# Patient Record
Sex: Male | Born: 1940
Health system: Southern US, Community
[De-identification: ages and names within clinical notes are randomized; demographics above are authoritative.]

## PROBLEM LIST (undated history)

## (undated) DIAGNOSIS — D509 Iron deficiency anemia, unspecified: Secondary | ICD-10-CM

## (undated) DIAGNOSIS — I10 Essential (primary) hypertension: Secondary | ICD-10-CM

## (undated) DIAGNOSIS — I714 Abdominal aortic aneurysm, without rupture, unspecified: Secondary | ICD-10-CM

## (undated) DIAGNOSIS — H9190 Unspecified hearing loss, unspecified ear: Secondary | ICD-10-CM

## (undated) DIAGNOSIS — E785 Hyperlipidemia, unspecified: Secondary | ICD-10-CM

## (undated) DIAGNOSIS — K219 Gastro-esophageal reflux disease without esophagitis: Secondary | ICD-10-CM

## (undated) DIAGNOSIS — C679 Malignant neoplasm of bladder, unspecified: Secondary | ICD-10-CM

## (undated) DIAGNOSIS — Z5189 Encounter for other specified aftercare: Secondary | ICD-10-CM

## (undated) DIAGNOSIS — G4730 Sleep apnea, unspecified: Secondary | ICD-10-CM

## (undated) DIAGNOSIS — I2510 Atherosclerotic heart disease of native coronary artery without angina pectoris: Secondary | ICD-10-CM

## (undated) DIAGNOSIS — A809 Acute poliomyelitis, unspecified: Secondary | ICD-10-CM

## (undated) HISTORY — PX: KNEE SURGERY: SHX244

## (undated) HISTORY — DX: Atherosclerotic heart disease of native coronary artery without angina pectoris: I25.10

## (undated) HISTORY — PX: CORONARY STENT PLACEMENT: SHX1402

## (undated) HISTORY — DX: Essential (primary) hypertension: I10

## (undated) HISTORY — DX: Iron deficiency anemia, unspecified: D50.9

## (undated) HISTORY — PX: CHOLECYSTECTOMY: SHX55

## (undated) HISTORY — DX: Acute poliomyelitis, unspecified: A80.9

## (undated) HISTORY — PX: KNEE ARTHROPLASTY: SHX992

## (undated) HISTORY — DX: Hyperlipidemia, unspecified: E78.5

## (undated) SURGERY — COLONOSCOPY
Anesthesia: Moderate Sedation

---

## 1999-12-15 ENCOUNTER — Ambulatory Visit (HOSPITAL_BASED_OUTPATIENT_CLINIC_OR_DEPARTMENT_OTHER): Admission: RE | Admit: 1999-12-15 | Discharge: 1999-12-16 | Payer: Self-pay | Admitting: Orthopedic Surgery

## 2004-05-06 ENCOUNTER — Encounter (INDEPENDENT_AMBULATORY_CARE_PROVIDER_SITE_OTHER): Payer: Self-pay | Admitting: *Deleted

## 2004-05-06 ENCOUNTER — Observation Stay (HOSPITAL_COMMUNITY): Admission: RE | Admit: 2004-05-06 | Discharge: 2004-05-07 | Payer: Self-pay | Admitting: General Surgery

## 2004-12-05 ENCOUNTER — Ambulatory Visit: Payer: Self-pay | Admitting: *Deleted

## 2004-12-05 ENCOUNTER — Ambulatory Visit (HOSPITAL_COMMUNITY): Admission: RE | Admit: 2004-12-05 | Discharge: 2004-12-05 | Payer: Self-pay | Admitting: *Deleted

## 2004-12-06 ENCOUNTER — Ambulatory Visit (HOSPITAL_COMMUNITY): Admission: RE | Admit: 2004-12-06 | Discharge: 2004-12-06 | Payer: Self-pay | Admitting: *Deleted

## 2004-12-07 ENCOUNTER — Ambulatory Visit: Payer: Self-pay | Admitting: Cardiovascular Disease

## 2004-12-08 ENCOUNTER — Inpatient Hospital Stay (HOSPITAL_BASED_OUTPATIENT_CLINIC_OR_DEPARTMENT_OTHER): Admission: RE | Admit: 2004-12-08 | Discharge: 2004-12-08 | Payer: Self-pay | Admitting: *Deleted

## 2004-12-08 ENCOUNTER — Ambulatory Visit: Payer: Self-pay | Admitting: *Deleted

## 2004-12-12 ENCOUNTER — Observation Stay (HOSPITAL_COMMUNITY): Admission: RE | Admit: 2004-12-12 | Discharge: 2004-12-13 | Payer: Self-pay | Admitting: Cardiology

## 2004-12-21 ENCOUNTER — Ambulatory Visit: Payer: Self-pay | Admitting: *Deleted

## 2005-03-28 ENCOUNTER — Ambulatory Visit: Payer: Self-pay | Admitting: *Deleted

## 2005-03-31 ENCOUNTER — Ambulatory Visit: Payer: Self-pay | Admitting: *Deleted

## 2005-12-29 ENCOUNTER — Ambulatory Visit: Payer: Self-pay | Admitting: *Deleted

## 2006-01-23 ENCOUNTER — Ambulatory Visit (HOSPITAL_COMMUNITY): Admission: RE | Admit: 2006-01-23 | Discharge: 2006-01-23 | Payer: Self-pay | Admitting: Family Medicine

## 2006-04-09 ENCOUNTER — Ambulatory Visit (HOSPITAL_COMMUNITY): Admission: RE | Admit: 2006-04-09 | Discharge: 2006-04-09 | Payer: Self-pay | Admitting: Internal Medicine

## 2006-04-11 ENCOUNTER — Ambulatory Visit (HOSPITAL_COMMUNITY): Admission: RE | Admit: 2006-04-11 | Discharge: 2006-04-11 | Payer: Self-pay | Admitting: Internal Medicine

## 2006-04-11 ENCOUNTER — Ambulatory Visit: Payer: Self-pay | Admitting: Internal Medicine

## 2006-04-11 ENCOUNTER — Encounter (INDEPENDENT_AMBULATORY_CARE_PROVIDER_SITE_OTHER): Payer: Self-pay | Admitting: Specialist

## 2007-01-09 ENCOUNTER — Ambulatory Visit: Payer: Self-pay | Admitting: Cardiovascular Disease

## 2007-01-14 ENCOUNTER — Encounter (HOSPITAL_COMMUNITY): Admission: RE | Admit: 2007-01-14 | Discharge: 2007-02-13 | Payer: Self-pay | Admitting: Cardiovascular Disease

## 2007-01-14 ENCOUNTER — Ambulatory Visit: Payer: Self-pay | Admitting: Cardiology

## 2007-06-09 ENCOUNTER — Ambulatory Visit: Payer: Self-pay | Admitting: Cardiovascular Disease

## 2007-06-09 ENCOUNTER — Inpatient Hospital Stay (HOSPITAL_COMMUNITY): Admission: EM | Admit: 2007-06-09 | Discharge: 2007-06-12 | Payer: Self-pay | Admitting: Emergency Medicine

## 2007-06-15 ENCOUNTER — Ambulatory Visit: Payer: Self-pay | Admitting: Cardiology

## 2007-07-08 ENCOUNTER — Ambulatory Visit: Payer: Self-pay | Admitting: Cardiovascular Disease

## 2007-07-09 ENCOUNTER — Ambulatory Visit: Payer: Self-pay | Admitting: Cardiovascular Disease

## 2007-07-09 ENCOUNTER — Inpatient Hospital Stay (HOSPITAL_BASED_OUTPATIENT_CLINIC_OR_DEPARTMENT_OTHER): Admission: RE | Admit: 2007-07-09 | Discharge: 2007-07-09 | Payer: Self-pay | Admitting: Cardiovascular Disease

## 2007-07-09 HISTORY — PX: CARDIAC CATHETERIZATION: SHX172

## 2007-09-12 ENCOUNTER — Ambulatory Visit: Payer: Self-pay | Admitting: Cardiovascular Disease

## 2008-03-26 ENCOUNTER — Ambulatory Visit: Payer: Self-pay | Admitting: Cardiovascular Disease

## 2008-03-26 ENCOUNTER — Ambulatory Visit: Payer: Self-pay

## 2008-09-17 DIAGNOSIS — E785 Hyperlipidemia, unspecified: Secondary | ICD-10-CM | POA: Insufficient documentation

## 2008-09-17 DIAGNOSIS — I2541 Coronary artery aneurysm: Secondary | ICD-10-CM | POA: Insufficient documentation

## 2008-09-18 ENCOUNTER — Encounter: Payer: Self-pay | Admitting: Cardiovascular Disease

## 2008-09-18 ENCOUNTER — Ambulatory Visit: Payer: Self-pay | Admitting: Cardiovascular Disease

## 2008-10-29 ENCOUNTER — Encounter: Payer: Self-pay | Admitting: Cardiology

## 2008-11-06 ENCOUNTER — Ambulatory Visit: Payer: Self-pay | Admitting: Cardiovascular Disease

## 2008-11-11 ENCOUNTER — Encounter (HOSPITAL_COMMUNITY): Admission: RE | Admit: 2008-11-11 | Discharge: 2008-12-11 | Payer: Self-pay | Admitting: Cardiovascular Disease

## 2008-11-11 ENCOUNTER — Ambulatory Visit: Payer: Self-pay | Admitting: Cardiology

## 2009-02-23 ENCOUNTER — Encounter (INDEPENDENT_AMBULATORY_CARE_PROVIDER_SITE_OTHER): Payer: Self-pay | Admitting: *Deleted

## 2009-02-23 LAB — CONVERTED CEMR LAB
ALT: 27 units/L
AST: 26 units/L
Albumin: 4.8 g/dL
Alkaline Phosphatase: 48 units/L
BUN: 18 mg/dL
CO2: 23 meq/L
Calcium: 10 mg/dL
Chloride: 104 meq/L
Cholesterol: 125 mg/dL
Creatinine, Ser: 0.82 mg/dL
Glucose, Bld: 99 mg/dL
HDL: 47 mg/dL
LDL Cholesterol: 66 mg/dL
Potassium: 4.8 meq/L
Sodium: 142 meq/L
Total Protein: 7.8 g/dL
Triglycerides: 60 mg/dL

## 2009-05-18 ENCOUNTER — Encounter (INDEPENDENT_AMBULATORY_CARE_PROVIDER_SITE_OTHER): Payer: Self-pay | Admitting: *Deleted

## 2009-05-19 ENCOUNTER — Ambulatory Visit: Payer: Self-pay | Admitting: Cardiovascular Disease

## 2009-05-19 DIAGNOSIS — I739 Peripheral vascular disease, unspecified: Secondary | ICD-10-CM | POA: Insufficient documentation

## 2009-05-25 ENCOUNTER — Ambulatory Visit (HOSPITAL_COMMUNITY): Admission: RE | Admit: 2009-05-25 | Discharge: 2009-05-25 | Payer: Self-pay | Admitting: Cardiovascular Disease

## 2009-10-20 ENCOUNTER — Encounter: Payer: Self-pay | Admitting: Cardiovascular Disease

## 2009-11-29 ENCOUNTER — Ambulatory Visit: Payer: Self-pay | Admitting: Cardiovascular Disease

## 2010-07-03 ENCOUNTER — Encounter: Payer: Self-pay | Admitting: Family Medicine

## 2010-07-12 NOTE — Assessment & Plan Note (Signed)
Summary: 6 mth f/u per checkout on 05/19/09/tg  Medications Added PLAVIX 75 MG TABS (CLOPIDOGREL BISULFATE) 1 tab by mouth once daily NITROSTAT 0.4 MG SUBL (NITROGLYCERIN) 1 tablet under tongue at onset of chest pain; you may repeat every 5 minutes for up to 3 doses.      Allergies Added: NKDA  Visit Type:  Follow-up Primary Provider:  DR.ZACH HALL  CC:  no cardiology complaints.  History of Present Illness: [name] is seen today for F/U of HTN, elevated lipids and CAD he has had previous stenting of the LAD.  Stent was patent on cath in 2009 and he had a non-ischemic myovue in 11/2008.  He has been doing well with no significatn SSCP.  His activity is limited by previous polio.  He has pain in his calves with ambulation.  Sometimes it occurs at rest.  Arterial Duplex and ABI's last visit showed no significant PVD.  He denises PND, orthopnea, palpitations, dypnea or edema.  He has been compliant with his meds    Current Medications (verified): 1)  Plavix 75 Mg Tabs (Clopidogrel Bisulfate) .Marland Kitchen.. 1 Tab By Mouth Once Daily 2)  Vytorin 10-40 Mg Tabs (Ezetimibe-Simvastatin) .... Tab By Mouth Once Daily 3)  Aspirin 81 Mg  Tabs (Aspirin) .Marland Kitchen.. 1 Tab By Mouth Once Daily 4)  Nitrostat 0.4 Mg Subl (Nitroglycerin) .Marland Kitchen.. 1 Tablet Under Tongue At Onset of Chest Pain; You May Repeat Every 5 Minutes For Up To 3 Doses.  Allergies (verified): No Known Drug Allergies  Vital Signs:  Patient profile:   70 year old male Weight:      189 pounds Pulse rate:   77 / minute BP sitting:   116 / 70  (right arm)  Vitals Entered By: Dreama Saa, CNA (November 29, 2009 2:22 PM)  Physical Exam  General:  Affect appropriate Healthy:  appears stated age HEENT: normal Neck supple with no adenopathy JVP normal no bruits no thyromegaly Lungs clear with no wheezing and good diaphragmatic motion Heart:  S1/S2 no murmur,rub, gallop or click PMI normal Abdomen: benighn, BS positve, no tenderness, no AAA no bruit.  No  HSM or HJR Distal pulses intact with no bruits No edema Neuro  withhered right arm from polio Skin warm and dry    Impression & Recommendations:  Problem # 1:  CORONARY ARTERY ANEURYSM (ICD-414.11) Stable no angina.  Continue ASA Refilles for Plavix and sl nitro called in  Problem # 2:  HYPERLIPIDEMIA-MIXED (ICD-272.4) LDL at goal with no side effects His updated medication list for this problem includes:    Vytorin 10-40 Mg Tabs (Ezetimibe-simvastatin) .Marland Kitchen... Tab by mouth once daily  Problem # 3:  CLAUDICATION (ICD-443.9) Leg pain improved with less caffeine.  Duplex with onl mild tibial disease bilaterally  Patient Instructions: 1)  Your physician recommends that you schedule a follow-up appointment in: 1 year 2)  Your physician recommends that you continue on your current medications as directed. Please refer to the Current Medication list given to you today. Prescriptions: PLAVIX 75 MG TABS (CLOPIDOGREL BISULFATE) 1 tab by mouth once daily  #30 x 11   Entered by:   Larita Fife Via LPN   Authorized by:   Benjamin Branch, MD, [facility]   Signed by:   Larita Fife Via LPN on 25/36/6440   Method used:   Electronically to        [facility]  [address] (retail)       [address]  [place], [place]  [address]       Ph: [phone]       Fax: [phone]   RxID:   [id number] NITROSTAT 0.4 MG SUBL (NITROGLYCERIN) 1 tablet under tongue at onset of chest pain; you may repeat every 5 minutes for up to 3 doses.  #25 x 3   Entered by:   Larita Fife Via LPN   Authorized by:   Benjamin Branch, MD, [facility]   Signed by:   Larita Fife Via LPN on 87/56/4332   Method used:   Electronically to        [facility]  [address] (retail)       [address]       [place], [place]  [address]       Ph: [phone]       Fax: [phone]   RxID:   [id number]

## 2010-07-12 NOTE — Miscellaneous (Signed)
Summary: [facility]   CC:  cheack up no complaints.  Vital Signs:  Patient profile:   70 year old male Weight:      138 pounds Pulse rate:   58 / minute BP sitting:   138 / 78  (left arm)  Vitals Entered By: Kem Parkinson (September 18, 2008 10:01 AM)

## 2010-07-12 NOTE — Miscellaneous (Signed)
Summary: LABS CMP,LIPIDS 02/23/2009  Clinical Lists Changes  Observations: Added new observation of CALCIUM: 10.0 mg/dL (16/03/9603 54:09) Added new observation of ALBUMIN: 4.8 g/dL (81/19/1478 29:56) Added new observation of PROTEIN, TOT: 7.8 g/dL (21/30/8657 84:69) Added new observation of SGPT (ALT): 27 units/L (02/23/2009 16:39) Added new observation of SGOT (AST): 26 units/L (02/23/2009 16:39) Added new observation of ALK PHOS: 48 units/L (02/23/2009 16:39) Added new observation of CREATININE: 0.82 mg/dL (62/95/2841 32:44) Added new observation of BUN: 18 mg/dL (06/14/7251 66:44) Added new observation of BG RANDOM: 99 mg/dL (03/47/4259 56:38) Added new observation of CO2 PLSM/SER: 23 meq/L (02/23/2009 16:39) Added new observation of CL SERUM: 104 meq/L (02/23/2009 16:39) Added new observation of K SERUM: 4.8 meq/L (02/23/2009 16:39) Added new observation of NA: 142 meq/L (02/23/2009 16:39) Added new observation of LDL: 66 mg/dL (75/64/3329 51:88) Added new observation of HDL: 47 mg/dL (41/66/0630 16:01) Added new observation of TRIGLYC TOT: 60 mg/dL (09/32/3557 32:20) Added new observation of CHOLESTEROL: 125 mg/dL (25/42/7062 37:62)

## 2010-10-25 NOTE — Assessment & Plan Note (Signed)
[facility]                            CARDIOLOGY OFFICE NOTE   [name], [name]                      MRN:          [id number]  DATE:03/26/2008                            DOB:          [birth year]    A 70 year old patient I usually see in  [place].  He was kind enough  to come to [facility] to see me.  He has a previous history of polio  with hypoplastic right side.  He has coronary artery disease with a  stent to the LAD.  He had a cath last year, which showed a widely patent  stent.  He is not having significant chest pain.  He is active.  He has  been compliant with his medications.   He has had occasional lightheaded spells.  They are not clearly related  to bradycardia, although I think we need to look into this a bit more.  He does not have palpitations or history of tachybrady syndrome,  although his heart rates do tend to be low and sinus bradycardia at  rates of anywhere from 48-52.  He is on no AV nodal blocking drugs.   He has no known allergies.   On aspirin today, Vytorin 10/40 and Plavix.   PHYSICAL EXAMINATION:  VITAL SIGNS:  His exam is remarkable for weight  of 187, pulse 50, blood pressure 130/70.  His pulse increased to 74 with  medium paced walking.  GENERAL:  He has significant residual results from his polio with right-  sided hypoplasia.  HEENT:  Unremarkable.  NECK:  Carotids are normal without bruit.  No lymphadenopathy,  thyromegaly, or JVP elevation.  LUNGS:  Clear with good diaphragmatic motion.  No wheezing.  S1 and S2.  Normal heart sounds.  PMI normal.  ABDOMEN:  Benign.  Bowel sounds positive.  No AAA.  No tenderness.  No  bruit.  No hepatosplenomegaly or hepatojugular reflux.  No tenderness.  EXTREMITIES:  Distal pulses intact.  No edema.  NEURO:  Nonfocal.   He has right-sided muscular wasting from his polio.  EKG shows sinus  bradycardia and is otherwise normal.   IMPRESSION:  1. History of coronary  artery disease to left anterior descending.      Continue aspirin, Plavix, no need for functional study giving cath      last year showing patent stent.  2. Episodes of dizziness and lightheadedness may be benign postural.      However, given his bradycardia, will give him an event monitor for      4-6 weeks and rule out any undiagnosed heart block or severe pause.      Avoid anteroventral nodal blocking drugs.  3. Hypercholesteremia in the setting of coronary artery disease.      Continue Vytorin, lipid and liver profile in 6 months.  4. History of polio.  I strongly recommended the patient to get H1N1      vaccine.  He is already had his flu vaccine.  He will talk to Dr.      Regino Schultze and see if one is available in  [place].  [name], MD, [facility]  Electronically Signed    PCN/MedQ  DD: 03/26/2008  DT: 03/27/2008  Job #: [id number]

## 2010-10-25 NOTE — Assessment & Plan Note (Signed)
[facility]                            CARDIOLOGY OFFICE NOTE   BENINO, KORINEK                      MRN:          [id number]  DATE:09/18/2008                            DOB:          March 30, 1941    [name] returns today for followup.  I had previously seen him in  [place].  He has a history of coronary artery disease with stenting  of the LAD about 4 years ago.   He has hypertension, hyperlipidemia.  His blood pressure is well  controlled off medicine.  He is trying to follow a low-sodium diet.  His  lab work was checked by Dr. Catalina Pizza in February.  I reviewed it.  His  LDL cholesterol was 57, total cholesterol was 123.  LFTs were normal.  Fasting glucose was a little high at 107.   We talked to Chrissie Noa at length regarding a low-carbohydrate diet.  I  also told him to have Dr. Catalina Pizza check a hemoglobin A1c on him.  The  patient did not had any significant chest pain.  I believe, we did a  Myoview study on him in [place] a year ago and it was nonischemic.   He is ambulatory.  He has some functional limitations from his previous  polio.   REVIEW OF SYSTEMS:  Otherwise negative.   He has no known allergies.   He is on aspirin a day, Vytorin 10/40, Plavix 75 a day.   PHYSICAL EXAMINATION:  GENERAL:  Remarkable for jovial male in no  distress.  He has a hypoplastic right side from his polio.  VITAL SIGNS:  Blood pressure is 130/70, pulse 70 and regular,  respiratory rate 14, afebrile.  HEENT:  Unremarkable.  NECK:  Carotids are normal without bruit.  No lymphadenopathy,  thyromegaly, or JVP elevation.  LUNGS:  Clear.  Good diaphragmatic motion.  No wheezing.  CARDIAC:  S1, S2.  Normal heart sounds.  PMI normal.  ABDOMEN:  Benign.  Bowel sounds positive.  No AAA, no tenderness, no  bruit, no hepatosplenomegaly, no hepatojugular reflux, or tenderness.  EXTREMITIES:  Distal pulses are intact.  No edema.  NEURO:  Nonfocal.  SKIN:   Warm and dry.  MUSCULOSKELETAL:  No muscular weakness.   IMPRESSION:  1. Coronary artery disease, previous stenting of the left anterior      descending.  Consider followup Myoview in a year.  Continue aspirin      and Plavix.  2. Hypercholesterolemia, well within goal.  Continue current dose of      Vytorin.  LFTs normal.  3. Previous polio, compensated.  No need for PT, OT.  4. Borderline blood sugar.  Check hemoglobin A1c per Catalina Pizza.      Dietary instructions given.     [name], MD, [facility]  Electronically Signed    PCN/MedQ  DD: 09/18/2008  DT: 09/18/2008  Job #: [id number]

## 2010-10-25 NOTE — Assessment & Plan Note (Signed)
[facility]                       [place] CARDIOLOGY OFFICE NOTE   [name], [name]                      MRN:          [id number]  DATE:11/06/2008                            DOB:          1941/04/28    [name] returns today for followup.  He has had increasing episodes or  presyncope.  He has had these episodes in the past for 20-30 seconds.  He feels lightheaded.  He sounds like he is having vagal episodes.  He  gets little dizzy.  There is no true vertigo.  There is no palpitations  or chest pain.  He has never actually fainted over the past week.  He  has been having 2 to 3 episodes per day.  He seems to downplay the  symptoms, but his wife wants to talk about them quite a bit.  He has had  these episodes in the past and we never found a cardiac etiology to  them.  He has had event monitors twice with no arrhythmias.   He has known coronary artery disease with presyncope and known coronary  disease.  His last Myoview study was done in August 2008.  He had a  negative pharmacological stress with normal left ventricular cavity size  and function.   In regards to his coronary artery disease, he has had previous stenting  of the LAD.  His last heart catheterization was done in January 2009 and  his stent was patent with good LV function and normal left ventricle  EDP.   He has been compliant with his medications including Plavix.   There seems to be no particular trigger for the presyncope.  He has not  had a history of seizures and there has been no evidence of TIA or CVA.  Ten-point review of systems otherwise negative.  He is on Plavix 75 a  day, Vytorin 10/40, and aspirin a day.   PHYSICAL EXAMINATION:  GENERAL:  His exam is remarkable for previous  polio deformity in the right upper extremity.  VITAL SIGNS:  His blood pressure is 122/76.  He is not postural.  Pulses  59 and regular.  Weight is 192.  Affect appropriate.  HEENT:   Unremarkable.  Carotids are normal without bruit.  No  lymphadenopathy, thyromegaly, or JVP elevation.  LUNGS:  Clear.  Good diaphragmatic motion.  No wheezing.  S1 and S2  normal heart sounds.  PMI normal.  ABDOMEN:  Benign.  Bowel sounds positive.  No AAA.  No tenderness.  No  bruit.  No hepatosplenomegaly or hepatojugular reflux.  No tenderness.  EXTREMITIES:  Distal pulses are intact.  No edema.  NEUROLOGIC:  Nonfocal.  SKIN:  Warm and dry.  MUSCULOSKELETAL:  No muscular weakness.   IMPRESSION:  1. Presyncope.  The patient does not want another event monitor to      previous ones have been unrevealing likely a simple vagal episodes,      stay well hydrated.  2. Coronary disease.  Continue aspirin and Plavix.  In light of his      presyncope, we will check an adenosine Myoview to rule  out      progression of coronary disease.  He is not having significant      chest pain.  Continue antiplatelet therapy.  3. Hypercholesterolemia.  Continue Vytorin, lipid, and liver profile      per Dr. Catalina Pizza.   I think the patient's heart is stable.  Hopefully, his Myoview study  will be normal.      Theron Arista C. Eden Emms, MD, [facility]  Electronically Signed    PCN/MedQ  DD: 11/06/2008  DT: 11/07/2008  Job #: [id number]

## 2010-10-25 NOTE — Discharge Summary (Signed)
NAME:  [name], [name] NO.:  [id number]   MEDICAL RECORD NO.:  [id number]          PATIENT TYPE:  INP   LOCATION:  A206                          FACILITY:  [facility]   PHYSICIAN:  Catalina Pizza, M.D.        DATE OF BIRTH:  Oct 26, 1940   DATE OF ADMISSION:  06/09/2007  DATE OF DISCHARGE:  12/30/2008LH                               DISCHARGE SUMMARY   DISCHARGE DIAGNOSES:  1. Presyncope.  2. History of coronary artery disease.  3. Hypertension.  4. Hyperlipidemia.  5. Post polio syndrome.   DISCHARGE MEDICATIONS:  1. Plavix 75 mg p.o. daily.  2. Aspirin 81 mg p.o. daily.  3. Vytorin 10/40 p.o. q.h.s.   DISCHARGE PHYSICAL EXAM:  VITAL SIGNS:  Temperature is 98.2, blood  pressure 121/76, pulse 57, respirations 18, O2 sat 94 to 96% on room  air. GENERAL:  This is a white male sitting on the side of the bed in no  acute distress.  HEENT:  Unremarkable.  NECK:  Showed no carotid bruits.  No thyromegaly.  LUNGS:  Good air movement throughout.  No rhonchi or wheezing.  HEART:  Regular rate and rhythm.  No murmurs, gallops or rubs.  ABDOMEN:  Soft, nontender, nondistended, positive bowel sounds.  EXTREMITIES:  Trace lower extremity edema bilaterally.  NEUROLOGIC:  Cranial nerves II-XII intact, has post polio type syndrome.  His right arm has significant atrophy and does have some scoliosis type  findings which are chronic.  No other neurologic complaints at this  time.   LABORATORY DATA:  Lab work obtained during hospitalization.  Initial CBC  showed white count 12.8, hemoglobin of 14.7, B met shows sodium 138,  potassium 3.7, chloride of 107, CO2 of 25, glucose 107, BUN 17,  creatinine 0.72, calcium of 9.1.  Magnesium was 2.0.  Initial CK was  349, 512, 544 and 424 respectively, MB was 14.4, 11.5, 11.4 and 10.5  respectively and troponin I was 0.03, 0.04, 0.03 and 0.01 respectively.  Lipid profile showed total cholesterol 138, triglycerides 93, HDL 31,  LDL of 88.   SCANS OBTAINED:  Patient did have carotid Dopplers which results are not  available at this time due to pack system being down.  The head CT was  read as negative.  No acute findings.  There is a 2-D echo which was  pending and may be able to be done this morning, if unable to then will  like be on need to the as outpatient.   HOSPITAL COURSE:  Per problem:   Presyncope.  It is unclear cause of this whether its neurogenetic or  cardiogenic.  He is not exhibiting any cardiac findings other than some  mild elevation CK and MB.  Unclear exactly cause of that, but does not  exhibit any ischemia per on troponin I's.  No abnormalities on  telemetry.  He does run a slower heart rate anyway, is not on a blood  pressure medicine.  Question whether its related to drop in blood sugars  whether was related to vagal type syndrome is unclear.  He did not  eat  anything that morning, which is his normal pattern but unclear if that  contributed to it or not, but given his cardiac history did get  cardiology involved to assess and did not feel this was a cardiac  related initially, but further evaluation is warranted including 2-D  echo and may need further outpatient workup.  The patient will continue  on Plavix, aspirin and Vytorin.  If able to get 2-D echo today, this  morning then great, it not been may need to be done as an outpatient.   DISPOSITION:  The patient will be discharged home with follow-up with me  in approximately 2 to 3 weeks and may want to get further cardiac follow-  up as an outpatient.      Catalina Pizza, M.D.  Electronically Signed     ZH/MEDQ  D:  06/11/2007  T:  06/11/2007  Job:  [id number]

## 2010-10-25 NOTE — Assessment & Plan Note (Signed)
[name]                       [place] CARDIOLOGY OFFICE NOTE   [name], [name]                      MRN:          [id number]  DATE:01/09/2007                            DOB:          1941/01/26    [name] is seen today as a new patient by me.  He has previously been  seen by [name].  He has also switched primary care doctors; he is  seeing [name] now as well as [name].   The patient has a previous history of polio resulting in right-sided  atrophy and weakness, particularly of the right upper extremity.  He has  coronary artery disease with previous drug-eluting stents to the  proximal LAD; this was done in July 2006.   He has not had a followup stress test since that time.   The patient has not had his nitroglycerin refilled since he had his  stents placed.   In talking to him, he is fairly active.  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  He walks on a daily basis.  He has not had  any significant chest pain, PND or orthopnea.  He has been no lower  extremity edema.   His EF has been in the normal range by previous echo, but has not been  checked in 2 years.   REVIEW OF SYSTEMS:  Otherwise negative.   PAST MEDICAL HISTORY:  Otherwise remarkable for:  1. Polio.  2. Coronary artery disease.  3. Hyperlipidemia.  4. Previous tobacco abuse.  5. Previous left knee surgery.   MEDICATIONS:  1. Plavix 75 mg a day, for which he needs a refill.  2. Vytorin 10/40.  3. An aspirin a day.   He is not on beta blockers.   PHYSICAL EXAMINATION:  His exam is remarkable for a healthy-appearing  middle-aged white male in no distress.  Weight is 204.  Affect is  appropriate.  Respiratory rate is 14, blood pressure is 120/78 and pulse  is 80 and regular.  HEENT:  Normal.  There is no lymphadenopathy, no thyromegaly, no bruits, no JVP  elevation.  Neck is otherwise supple.  LUNGS:  Clear.  Good  diaphragmatic motion.  No wheezing.  He does have a bit of hypoplastic right thorax.  There is an S1 and S2 with normal heart sounds.  PMI is normal.  ABDOMEN:  Benign.  Bowel sounds are positive.  No hepatosplenomegaly.  No hepatojugular reflux.  No tenderness.  No AAA.  No renal bruits.  Distal pulses are intact.  No edema.  NEUROLOGIC:  Nonfocal.  He has significant atrophy of the right upper  extremity from his polio.  There is no other muscular weakness outside  of the right upper extremity.   His baseline EKG shows mild T-wave inversions in V1 through V3.   IMPRESSION:  1. Coronary artery disease with previous stenting of the left anterior      descending.  Followup adenosine Myoview.  Given the extent of      hardware that he has in his left anterior descending, I think he  should have a stress test at least every 2 years, even if he is      asymptomatic.  He will continue his aspirin and Plavix.  At some      point, we will need to rethink having him on a beta blocker, given      his known coronary disease.  2. Hyperlipidemia.  Previous LDL cholesterol in April of 60 with      normal liver function tests.  Continue Vytorin.  I talked to him at      length regarding the Zetia component, which I thought was safe for      the time-being.  3. History of polio.  Follow up with [name].  I urged him to make      sure that he got his pneumococcal and influenza vaccines every      year, given some of the mechanical difficulties with lung expansion      on the right side and he seems to have compensated for his right      upper extremity weakness well and does not need physical therapy or      occupational therapy.  4. Previous left knee surgery with significant arthritis.  Continue as-      needed nonsteroidals.  He will be referred to Orthopedics on an as-      needed basis.  I suspect at some point in the future he will need a      total knee replacement.  5. Previous tobacco  use.  The patient has not smoked in quite some      time.  I congratulated him on this.  He understands the risk of      progression of his coronary disease if he were to continue.  If he      feels the urge, he will call me and we can give him a prescription      for Chantix.   Overall, [name] is doing well and I will see him for a stress test in  the next few weeks.     [name], MD, [facility]  Electronically Signed    PCN/MedQ  DD: 01/09/2007  DT: 01/10/2007  Job #: [id number]

## 2010-10-25 NOTE — Group Therapy Note (Signed)
NAME:  [name], [name] NO.:  [id number]   MEDICAL RECORD NO.:  [id number]          PATIENT TYPE:  INP   LOCATION:  A206                          FACILITY:  [facility]   PHYSICIAN:  Catalina Pizza, M.D.        DATE OF BIRTH:  1941/03/11   DATE OF PROCEDURE:  DATE OF DISCHARGE:                                 PROGRESS NOTE   SUBJECTIVE:  [name] is a 70 year old white male with a significant  past medical history of coronary artery disease status post stenting  with history of hypertension and hyperlipidemia.  Apparently he, per the  history and physical, had an episode of presyncope with initial  dizziness.  No specific chest pain or palpitations.  Lasted  approximately 10-15 minutes.  He improved and got back to normal prior  to coming to the emergency department but what was very concerning and  wanted further workup for this.  At this time, the patient is totally  asymptomatic and back to normal but he has noted that he has had some  dizziness off and on for sometime now.   PHYSICAL EXAMINATION:  VITAL SIGNS:  Temperature is 98.1, blood pressure  104/70, pulse 62, respirations 16-18, sating 94-96% on room air.  GENERAL:  This is a white male sitting in bed in no acute distress.  HEENT:  Is unremarkable.  Pupils equal and reactive to light and  accommodation.  NECK:  Showed no carotid bruits.  No thyromegaly.  LUNGS:  Good air movement throughout.  No rhonchi or wheezing.  HEART:  Is regular rate and rhythm.  I did not appreciate any murmurs,  no S3, S4 or gallops.  ABDOMEN:  Is soft, nontender, nondistended.  Positive bowel sounds.  EXTREMITIES:  Trace lower extremity edema bilaterally.  NEUROLOGIC:  Cranial nerves II-XII intact.  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  No other neurologic  deficits noted.   LAB WORK:  Last cardiac panel was CK of 512, MB of 11.5, troponin I  0.04, repeat CK was 544, MB of 11.4, troponin I  0.03.   IMPRESSION:  This is a 70 year old gentleman with presyncope-type  symptoms of unknown origin.   ASSESSMENT/PLAN:  Presyncope.  The patient will get some carotid  Doppler's as well as repeat cardiac panel.  He did have elevated CK and  MB but no significant elevation in troponin I.  Unclear exact the  findings without any signs of acute ischemia, question whether the  patient had an abnormal rhythm which caused this presyncope-type  episode.  Has not had any symptoms like this before.  He has noticed  some dizziness periodically but has not had any significant chest pains  or shortness of breath.  Given the dizziness finding we will get a  carotid Doppler's as well  as a noncontrast CT of the head to rule out any significant  abnormalities which could be contributing to the dizziness but feel this  could be cardiac related.  He has had negative stress test approximately  3 months ago.  Will get await further Cardiology input on this.      Catalina Pizza, M.D.  Electronically Signed     ZH/MEDQ  D:  06/10/2007  T:  06/10/2007  Job:  [id number]

## 2010-10-25 NOTE — Assessment & Plan Note (Signed)
[facility] HEALTHCARE                       [place] CARDIOLOGY OFFICE NOTE   [name], [name]                      MRN:          [id number]  DATE:09/12/2007                            DOB:          [birth year]    HISTORY OF PRESENT ILLNESS:  [name] returns today for follow-up.  He  has had previous stent to the LAD.  Recent cath showed it to be widely  patent with only moderate residual disease.  He is not having chest  pain.  He had nonischemic Myoview.  Risk factors are well modified.  He  had lab work at Dr. Scharlene Gloss office recently.  His SGOT was 44, otherwise  normal.  LDL was 45.  He will have a follow-up liver test in about 4  months.   REVIEW OF SYSTEMS:  Otherwise negative.  I congratulated him on about a  14 pounds weight loss.  He has been very good with his diet.  His review  of systems is otherwise negative.   CURRENT MEDICATIONS:  1. Coenzyme Q.  2. Aspirin a day.  3. Vytorin 10/40.  4. Plavix 75 a day.   PHYSICAL EXAMINATION:  GENERAL:  Remarkable for healthy-appearing middle-  aged white male in no distress.  VITAL SIGNS:  Weight is down from 213  to 196, blood pressure is 117/68,  pulse 57 and regular, afebrile, respiratory 14.  HEENT:  Unremarkable.  Carotids normal without bruit.  No  lymphadenopathy, thyromegaly, JVP elevation.  LUNGS:  Clear with diaphragmatic motion.  No wheezing, S1-S2.  Normal  heart sounds.  PMI normal.  ABDOMEN:  Benign.  Bowel sounds positive.  No AAA.  No tenderness, no  bruits, no hepatosplenomegaly or hepatojugular reflux.  Distal pulse  intact.  No edema.  NEUROLOGICAL:  Nonfocal.  SKIN:  Warm and dry.  He has a chronic deformity of his right upper  extremity from previous trauma.   IMPRESSION:  1. Coronary disease with previous stent to the LAD, patent by cath.      Continue aspirin and Plavix.  No beta-blocker due to relative      bradycardia at rest.  2. Weight loss.  Continue excellent diet.   The patient has done      extremely well.  3. Hyperlipidemia.  LDL excellent.  Mild elevation SGOT.  Follow-up in      6 months.  No indication to stop medication at this time.  4. Previous polio.  Chronic right upper extremity deformity.  No other      arthritic or musculoskeletal problems compensating well.   Overall, [name] is doing well.  I will see him back in 102-months.     [name], MD, [facility]  Electronically Signed    PCN/MedQ  DD: 09/12/2007  DT: 09/12/2007  Job #: [id number]

## 2010-10-25 NOTE — Consult Note (Signed)
NAME:  [name], VOIT NO.:  [id number]   MEDICAL RECORD NO.:  [id number]          PATIENT TYPE:  INP   LOCATION:  A206                          FACILITY:  [facility]   PHYSICIAN:  [name], MD, FACCDATE OF BIRTH:  September 25, 1940   DATE OF CONSULTATION:  06/10/2007  DATE OF DISCHARGE:                                 CONSULTATION   CARDIOLOGY CONSULTATION:   REASON FOR REFERRAL:  Near syncope.   HISTORY OF PRESENT ILLNESS:  [name] is a 70 year old male patient  with a history of coronary artery disease status post drug-eluting stent  placement x2 to the proximal LAD in July 2006 who was at church  yesterday when he developed sudden onset of near syncope.  He went  outside with his wife.  His wife said he was diaphoretic and his lips  were cyanotic.  He denied any associated chest pain, shortness of breath  or nausea.  His symptoms lasted for about 10 to 15 minutes and abated on  their own.  He denies any frank syncope.  He decided to present to the  emergency room at [facility].  He was admitted for further  evaluation.  He noted recurrent dizziness this morning, somewhat similar  to what he had last night.  Thus far on telemetry he has only been in  sinus rhythm and sinus bradycardia.  His lowest heart rate is in the mid  50s.  His EKG is unremarkable except for T-wave inversions in V1 and V2.  His cardiac markers thus far have been mildly abnormal.  His CK MBs have  been 14.4, 11.5, and 11.4.  His index has been 4.1, 2.2, and 2.1.  His  troponins have been completely negative at 0.03, 0.04, and 0.03.   The patient denies any recent history of exertional chest pain or  shortness of breath.  He walks quite often without difficulty.  He  describes MHA class I to II symptoms.  He denies orthopnea or PND.  Denies any significant pedal edema.  Denies any palpitations.  He does  note recent history of indigestion.  This seems to be associated with  meals.   He treats this on his own with Tums with relief.   PAST MEDICAL HISTORY:  1. Coronary artery disease.      a.     Status post Cypher stenting x2 the proximal LAD July 2006.      b.     Adenosine Myoview August 2008 showing no ischemia.  EF 62%.  2. Good LV function.  3. Hyperlipidemia, treated.  4. History of sinus bradycardia.  5. History of polio with atrophic right upper extremity.  6. History of left knee surgery.  7. Status post cholecystectomy.   MEDICATIONS AT HOME:  1. Plavix 75 mg daily.  2. Aspirin 325 mg daily.  3. Vytorin 10/40 mg daily.   CURRENT MEDICATIONS:  The same as above.   ALLERGIES:  NO KNOWN DRUG ALLERGIES.   SOCIAL HISTORY:  The patient lives in [place] with his wife.  He has  two children.  He is  a retired Chartered certified accountant.  He has a 40 pack-year history  of smoking and quit in 2006.  Denies alcohol abuse.   FAMILY HISTORY:  Family history is significant for coronary artery  disease.  His brother died of myocardial infarction at age 3.   REVIEW OF SYSTEMS:  Please see history of present illness.  Denies any  fevers, chills, melena, hematochezia, hematuria or dysuria.  Denies  dysphagia or odynophagia.  Denies monocular blindness, unilateral  weakness, difficulty with speech or facial droop.  He does have chronic  right upper extremity atrophy secondary to his polio.  Rest of the  review of systems are negative.   PHYSICAL EXAMINATION:  GENERAL:  He is a well-nourished, well-developed  man.  VITAL SIGNS:  Blood pressure 104/70, pulse 62, respirations 18,  temperature 98.1, oxygen saturation 94% on room air.  Weight 95.6 kg.  In's and out's incomplete at this point in time.  HEENT:  Normal.  NECK:  Without JVD.  No thyromegaly.  LYMPHATICS:  Without lymphadenopathy.  CARDIAC:  Normal S1 and S2.  Regular rate and rhythm without murmur.  LUNGS:  Clear to auscultation bilaterally without wheezing, rhonchi or  rales.  ABDOMEN:  Soft and nontender with  active bowel sounds.  No organomegaly.  No bruits.  EXTREMITIES:  No clubbing, cyanosis or edema.  Right upper extremity is  noted to be atrophic.  SKIN:  Warm and dry.  NEUROLOGIC:  He is alert and oriented x3.  Cranial nerves II through XII  grossly intact.  VASCULAR:  Without carotid bruits bilaterally.  Dorsalis pedis and  posterior tibialis pulses are 2+ bilaterally.  Femorals without bruits  bilaterally.   LABORATORY DATA:  Chest x-ray is unavailable at this point in time.  EKG  shows sinus bradycardia with a heart rate of 55, normal axis, T-wave  inversion in V1-V2.  Hemoglobin 14.7, hematocrit 44.3, white count  12,800, platelet count 263,000.  Sodium 138, potassium 3.7, BUN 17,  creatinine 0.72, glucose 107.  Cardiac markers as noted above.  Magnesium 2.1.   IMPRESSION:  1. Near syncope.  2. Mildly abnormal CK-MBs with normal indices and normal troponins.  3. Coronary artery disease.      a.     Status post drug-eluting stent placement x2 to the LAD in       2006.      b.     Nonischemic Myoview August 2008.      c.     Good left ventricular function.  4. Hyperlipidemia.  5. History of polio.   PLAN:  The patient presents with episode of near syncope.  He does have  mildly elevated CK-MBs, but his indices have been normal and his  troponins have been normal.  Echocardiogram, head CT, and carotid  Dopplers are  pending.  We will await the results of these. Telemetry monitoring will  be continued.  If his in-house workup is negative, we can consider  outpatient Cardiolite event monitoring to rule out significant  bradyarrhythmia contributing to his near-syncope.      Tereso Newcomer, PA-C      Peter C. Eden Emms, MD, [facility]  Electronically Signed    SW/MEDQ  D:  06/10/2007  T:  06/10/2007  Job:  [id number]   cc:   Catalina Pizza, M.D.  Fax: [phone]

## 2010-10-25 NOTE — Procedures (Signed)
NAME:  CAMDYN, BESKE NO.:  [id number]   MEDICAL RECORD NO.:  [id number]          PATIENT TYPE:  INP   LOCATION:  A206                          FACILITY:  [facility]   PHYSICIAN:  Gerrit Friends. Dietrich Pates, MD, FACCDATE OF BIRTH:  [birth year]   DATE OF PROCEDURE:  06/12/2007  DATE OF DISCHARGE:  06/12/2007                                ECHOCARDIOGRAM   CLINICAL DATA:  This is a 70 year old gentleman with syncope.  1. Technically adequate echocardiographic study.  2. Left atrial size at the upper limit of normal; normal right atrium.  3. Normal right ventricular size and function with borderline RVH.  4. Normal pulmonic valve and proximal pulmonary artery.  5. Normal mitral valve; mild annular calcification.  6. Mild to moderate sclerosis of a trileaflet aortic valve; no      stenosis.  7. Normal diameter of the proximal ascending aorta; mild calcification      of the wall and annulus.  8. Normal internal dimension, wall thickness, regional and global      function of the left ventricle.      Gerrit Friends. Dietrich Pates, MD, [facility]  Electronically Signed     RMR/MEDQ  D:  06/12/2007  T:  06/13/2007  Job:  [id number]

## 2010-10-25 NOTE — H&P (Signed)
NAME:  [name], VARKEY NO.:  [id number]   MEDICAL RECORD NO.:  [id number]          PATIENT TYPE:  INP   LOCATION:  A206                          FACILITY:  [facility]   PHYSICIAN:  Melvyn Novas, MDDATE OF BIRTH:  1940-06-22   DATE OF ADMISSION:  06/09/2007  DATE OF DISCHARGE:  LH                              HISTORY & PHYSICAL   REASON FOR ADMISSION:  The patient is a 70 year old white male with a  history of coronary artery disease status post stenting on bifurcated  coronary artery 3 years ago, with history of hypertension and  hyperlipidemia, who had been doing well since his stenting 3 years ago  and complying with medicines. Apparently, he was in church today and  felt a dizzy feeling, which persisted for 4 to 5 minutes. He had a near  syncopal episode and was taken outside where his wife said he had pallor  and his lips were cyanotic. The patient specifically denied any  antecedent palpitations or any antecedent anginal pain. However, he does  state that he was diaphoretic during the episode. It took him about 10  to 15 minutes to recover and he was brought to the emergency room. There  was no fall, no seizure activity witnessed. While in the emergency room,  he was found to have a mildly elevated myoglobin of 256 and CK MB of 9.1  minimally elevated and a normal troponin, less than 0.05. The patient  will be admitted. This sounds like probable vagal episode. However, due  to previous coronary artery disease and possible sound ischemic mediated  possibilities, will put him in a controlled environment, place him on  continued aspirin and Plavix and serial cardiac enzymes and consider  functional evaluation if this primary and cardiology doctors deem  appropriate. He had a negative Cardiolite scan 3 months ago, according  to the patient.   PAST MEDICAL HISTORY:  Significant for polio as a child with right arm  atrophy. Hypertension, hyperlipidemia, and  coronary artery disease.   PAST SURGICAL HISTORY:  Remarkable for cholecystectomy and a coronary  stenting of 3 vessels of bifurcated single vessel coronary artery. Also,  he had left knee arthroscopic pinning surgery after a fall at work.   SOCIAL HISTORY:  He is married with 2 children. Smoked for 40 years, a  pack a day. Quit 3 years ago with a stent. Does not use alcohol and is  retired as a Chartered certified accountant.   PHYSICAL EXAMINATION:  VITAL SIGNS:  Blood pressure 149/77, temperature  98.6, pulse 62, respiratory rate 20. O2 sat is 95% on room air.  GENERAL:  Alert and oriented. Pleasant and cooperative. He gives a good  history with co-witnessed by family members.  HEENT:  PERRLA. Extraocular movements intact. Sclerae clear.  NECK:  No carotid bruits, no thyromegaly.  LUNGS:  Prolonged expiratory phase. No rales, wheezes, or rhonchi  appreciable.  HEART:  Regular rate and rhythm. S1 and S2 with normal intensity. No S3,  S4, gallops. No heaves, thrills, or rubs.  ABDOMEN:  Soft, nontender. No detectable organomegaly. No guarding,  rebound, masses,  or megaly.  EXTREMITIES:  Trace pedal edema.  NEUROLOGIC:  Cranial nerves 2-12 are grossly intact. His right arm is  essentially atrophic secondary to polio and his right and left leg have  good motor strength.   IMPRESSION:  1. Near syncopal episode, probably vagal induced.  2. Coronary artery disease status post stenting of 3 stents in 1      coronary artery 3 years ago.  3. Hypertension, suboptimally controlled, although anxious at present.  4. Hyperlipidemia, treated with Vytorin.   PLAN:  Admit. Do serial cardiac enzymes q.8 x3. Presumably these will be  normal. Continue aspirin and Plavix. I do not see the need for Lovenox  at present. Clinically, keep him in a controlled environment and  consider functional evaluation or just observation of symptoms while  patient ambulates over 24 to 36 hours period.      Melvyn Novas, MD  Electronically Signed     RMD/MEDQ  D:  06/09/2007  T:  06/09/2007  Job:  [id number]

## 2010-10-25 NOTE — Assessment & Plan Note (Signed)
[facility] HEALTHCARE                       [place] CARDIOLOGY OFFICE NOTE   SYD, NEWSOME                      MRN:          [id number]  DATE:07/08/2007                            DOB:          02/15/1941    Mr. Barcellos returns for follow-up. He was in the hospital from December 28  to December 30.  He had presyncope.  This occurred while in church.  His  wife said he was white with blue lips.  He was monitored in the  hospital.  Saw him initially as a consult but did not follow up through  his hospital discharge. The patient's troponins were negative.  However,  his CPKs were elevated with CPK 512 and an MB 11.5, 544, 11.4   The patient was sent home with monitor.   In talking to the patient he has a history of previous LAD stent back in  2005.  At the time he did have chest pain with exertion.  He has not had  any of this since his hospital discharge.  He has been fine and no  palpitations.  No chest pain, no syncope.  No PND, orthopnea.   The patient had a Myoview in August 2008 and was pharmacologic it was  negative.  His EF was 62%.   I talked to Mr. Trefz and his wife today.  Was little bit concerned  after reviewing all of his CPKs and not having any other good reason for  him to have syncope.   His LAD stents were proximal.  It makes no sense to repeat another  stress Myoview so soon.  However, I am still concerned that his syncope  may have been cardiac in etiology after discussing the options we have  decided to proceed with a JV heart cath,   The patient's risk factors are well modified he is a nonsmoker.  His  last LDL cholesterol was 60 on statin therapy.  He is a nondiabetic.   REVIEW OF SYSTEMS:  Otherwise negative.  His meds include Plavix 75 a  day, Vytorin 10/40 a day, aspirin and coenzyme Q.   EXAM:  Is remarkable for weight 213, blood pressure 102/74, pulse 74 and  regular, afebrile, respiratory 14.  HEENT:   Unremarkable.  Carotids are without bruit.  No lymphadenopathy, thyromegaly, JVP  elevation.  LUNGS:  Clear diaphragmatic motion.  No wheezing.  S1-S2 normal heart sounds.  PMI normal.  ABDOMEN:  Benign bowel sounds positive no AAA no tenderness.  No jugular  reflux.  Distal pulse intact.  No edema.  NEURO:  Nonfocal cold.  He has a limp right upper extremity from polio.  No other muscular weakness.   His baseline EKG shows sinus rhythm with a Q in lead III.   IMPRESSION:  1. Syncope.  Positive CPKs negative troponin hospital nonischemic      Myoview in August.  JV heart cath to assess a recurrence of      coronary artery disease, particularly in the ostial and proximal      LAD.  2. Hyperlipidemia in the setting coronary disease.  Continue Vytorin  10/40 LDL in the 60 range at goal.  3. Recent syncope rule out cardiac etiology, continue to wear a Holter      monitor for the next few days.  We will review the strips but he      has not had any events since discharge.  4. Polio right upper extremity weakness compensated no indication for      rehab at this time.  5. Previous tobacco use.  Continue to abstain.     [name], MD, [facility]  Electronically Signed    PCN/MedQ  DD: 07/08/2007  DT: 07/08/2007  Job #: [id number]

## 2010-10-25 NOTE — Cardiovascular Report (Signed)
NAME:  [name], [name] NO.:  [id number]   MEDICAL RECORD NO.:  [id number]          PATIENT TYPE:  OIB   LOCATION:  1963                         FACILITY:  [facility]   PHYSICIAN:  [name], MD, FACCDATE OF BIRTH:  1940-09-25   DATE OF PROCEDURE:  DATE OF DISCHARGE:                            CARDIAC CATHETERIZATION   CORONARY ARTERIOGRAPHY   INDICATIONS:  A 66-year patient admitted to the hospital at [facility] a  few weeks ago, with syncope and positive CPKs but negative troponin.   History of stent to the LAD.   Cine catheterization done, 4-French catheters from right femoral artery.   The left main coronary artery was normal.   Left anterior descending artery had a widely patent stent in the  proximal vessel.  The mid-LAD was calcified.  There appeared to be a 40%  to 50% calcific lesion at the takeoff of the second diagonal branch.  Second diagonal branch itself appeared to have a 40% to 50% ostial  stenosis.  Distal LAD was somewhat of a small vessel.  We give IC  nitroglycerin, and it still was somewhat of a small vessel with a stent  being significantly oversized proximally.  I do not think the lesion in  the mid LAD was flow-limiting.  The circumflex coronary was large and  left dominant.  There was a medium-sized intermediate branch with 30% to  40% multiple discrete lesions proximally.  The proximal circ itself had  20% multiple discrete lesions.  There was a very small first obtuse  marginal branch with 70% to 80% diffuse distal disease at a bifurcation.  This was not amenable to angioplasty.  The distal circ PDA, PLA x2 had  30% to 40% multiple discrete lesions.   Right coronary artery was small and nondominant.  There is 20% to 30%  multiple discrete lesions and mid-vessel.   RAO ventriculography:  RAO ventriculography showed an EF of 60%.  There  was no gradient across aortic valve and no MR.  LV pressure was 135/18.  Aortic pressure was  135/74.   IMPRESSION:  The patient is not having chest pain.  He had syncope with  mixed enzyme results.   He had a non-ischemic Myoview in August.   I think that continued medical therapy and aggressive risk factor  modification are in order.  I do not think that the mid-LAD is flow-  limiting.   He has good LV function.  So long as he remains asymptomatic, he will be  continued to be treated medically.      [name], MD, [facility]  Electronically Signed     PCN/MEDQ  D:  07/09/2007  T:  07/09/2007  Job:  [id number]

## 2010-10-28 NOTE — Assessment & Plan Note (Signed)
[facility]                         [place] CARDIOLOGY OFFICE NOTE   DIANNE, BADY                      MRN:          [id number]  DATE:12/29/2005                            DOB:          [birth year]    PRIMARY CARE PHYSICIAN:  Angus G. McInnis, MD   Mr. Pressnell is a man I follow for his coronary disease, status post  revascularization of his left anterior descending coronary artery with two  drug-eluting stents in July, 2006.  He is a previous tobacco abuser.  He has  a history of polio with an atrophic right upper extremity.  He has  hyperlipidemia.  He has done remarkably well and came in today for a  discussion about whether to stop his Plavix.  Dr. Samule Ohm, who did his  procedure, had recommended he stay on it for a year, but given the new  information we know about drug-eluting stents, we had a detailed discussion  about this, and he has chosen to continue to take the Plavix long term.  He  is also on aspirin 325 mg once a day and Vytorin 10/40 once a day.  His  recent cholesterol evaluation looked really good.  He is without any  significant complaints.   PHYSICAL EXAMINATION:  VITAL SIGNS:  His blood pressure is 120/72 in his  left arm.  His pulse is 74.  CHEST:  Clear.  NECK:  He has no jugular venous distention.  CARDIOVASCULAR:  Regular with no murmurs.  EXTREMITIES:  Lower extremities without edema.   His most recent cholesterol panel shows a total of 117, HDL 36, LDL 68 with  triglycerides of 65.  His LFTs, I think, were within normal limits with the  exception of just a slightly elevated SGPT of 42.  The high end of normal is  40.   So, primarily secondary prophylaxis for coronary disease.  He is on a  reasonable drug medication regime.  He has decided to continue the Plavix.  His Vytorin appears to be controlling his cholesterol well.  He does not  smoke anymore.  His blood pressure is under good control with no  medications.  He does not have a history of diabetes.  He is a physically  active man and continues to be so, given limitations from his polio.  Overall, I think he is doing well.  We will see him back in routine  followup.                                   Farris Has. Dorethea Clan, MD   JMH/MedQ  DD:  12/29/2005  DT:  12/29/2005  Job #:  [id number]   cc:   Angus G. Renard Matter, MD

## 2010-10-28 NOTE — Op Note (Signed)
NAME:  LASHAWN, ORREGO NO.:  [id number]   MEDICAL RECORD NO.:  [id number]          PATIENT TYPE:  AMB   LOCATION:  DAY                          FACILITY:  [facility]   PHYSICIAN:  Gita Kudo, M.D. DATE OF BIRTH:  Dec 08, 1940   DATE OF PROCEDURE:  05/06/2004  DATE OF DISCHARGE:                                 OPERATIVE REPORT   PREOPERATIVE DIAGNOSIS:  Gallstones.   POSTOPERATIVE DIAGNOSIS:  Gallstones, normal cholangiogram.   OPERATIVE PROCEDURE:  Laparoscopic cholecystectomy with cholangiogram.   SURGEON:  Gita Kudo, M.D.   ASSISTANT:  Lebron Conners, M.D.   ANESTHESIA:  General endotracheal.   CLINICAL SUMMARY:  A 70 year old male brought in for elective  cholecystectomy.  He has vague abdominal symptoms, CT scan showing  gallstones, and slight elevation of SGOT and SGPT.  Other liver function  studies are normal.   OPERATIVE FINDINGS:  The patient had a thin-walled gallbladder.  The cystic  duct and artery were normal in anatomy and size.  There were a few adhesions  of the gallbladder, but it was not acutely inflamed.   OPERATIVE PROCEDURE:  Under satisfactory general anesthesia, having received  1.0 g Ancef preop, the patient was positioned, prepped and draped in the  standard fashion.  A total of 25 mL of 0.5% Marcaine with epinephrine was  infiltrated at the skin sites for postop analgesia.  Then a transverse  incision made at the umbilicus and the midline opened into the peritoneum.  Controlled with a figure-of-eight 0 Vicryl suture and operating Hasson port  inserted and secured.  A good CO2 pneumoperitoneum was established and  camera placed.  Under direct vision, two #5 ports placed laterally and one  #10 medially.  Graspers through the lateral ports gave excellent exposure,  and then the medial port was used for dissection.  Adhesions of the  gallbladder taken down, cystic duct and artery each circumferentially  dissected and when  identified definitely, the artery was controlled with  multiple clips and a single clip placed on the duct near the gallbladder.  A  percutaneous catheter was placed and x-rays taken showing a good filling  without defect or obstruction.  Then the catheter withdrawn and the duct  controlled with multiple clips and it and the artery divided.  The  gallbladder removed from below upward using the coagulating hook for  hemostasis and dissection.  A small hole was made in the gallbladder with  spillage of some clear bile.  After the gallbladder was removed from the  liver bed, it was placed in an EndoCatch bag.  Then the operative site was  lavaged with saline until the returns were clear.  The liver bed made  hemostatic by cautery and then the camera moved to the upper port.  A large  grasper through the lower port removed the gallbladder, encased in the bag,  without spillage or problem.  The operative site then checked again,  suctioned dry, and ports and CO2 released.  The midline closed with the  previous figure-of-eight suture.  A simple 0 Vicryl suture placed at the  upper medial port and then skin edges approximated with Steri-Strips after  subcu sutures of 4-0 Vicryl.  Dressings applied.  The patient to the  recovery room from the operating room in good condition.      MRL/MEDQ  D:  05/06/2004  T:  05/06/2004  Job:  [id number]   cc:   Angus G. Renard Matter, MD  [address]  [place]  [place] [address]  Fax: [phone]

## 2010-10-28 NOTE — Procedures (Signed)
NAME:  [name], [name] NO.:  [id number]   MEDICAL RECORD NO.:  [id number]          PATIENT TYPE:  OUT   LOCATION:  RAD                           FACILITY:  [facility]   PHYSICIAN:  Vida Roller, M.D.   DATE OF BIRTH:  Jul 13, 1940   DATE OF PROCEDURE:  12/06/2004  DATE OF DISCHARGE:                                  ECHOCARDIOGRAM   TAPE NUMBER:  [id number].   TAPE COUNT:  [id number]   PRIMARY CARE PHYSICIAN:  Angus G. McInnis, MD   REASON FOR ECHOCARDIOGRAM:  A 70 year old man with chest pain.   TECHNICAL QUALITY OF STUDY:  Adequate   M-MODE TRACINGS:  The aorta is 30 mm.   Left Atrium is 45 mm.   Septum is 14 mm.   The posterior wall is 13 mm.   Left ventricular diastolic dimension is 43 mm.   Left ventricular systolic dimension is 30 mm.   A 2D AND DOPPLER IMAGING:  The left ventricle is normal size with normal systolic function.  Estimated  ejection fraction 60-65%.  There are no wall motion abnormalities seen.  There is mild concentric left ventricular hypertrophy.   Right ventricle is top normal size with normal systolic function.   Both atria are enlarged.   The aortic valve is sclerotic with no stenosis or regurgitation.   The mitral valve is mildly thickened with annular calcification.  There is  no stenosis or regurgitation.   The tricuspid valve has trivial regurgitation.   Pulmonic valve not well seen.   There is no pericardial effusion.   The inferior vena cava and ascending aorta were not well seen.       JH/MEDQ  D:  12/06/2004  T:  12/06/2004  Job:  [id number]

## 2010-10-28 NOTE — Cardiovascular Report (Signed)
NAME:  [name], [name] NO.:  [id number]   MEDICAL RECORD NO.:  [id number]          PATIENT TYPE:  INP   LOCATION:  2899                         FACILITY:  [facility]   PHYSICIAN:  Salvadore Farber, M.D. LHCDATE OF BIRTH:  1940/07/05   DATE OF PROCEDURE:  12/12/2004  DATE OF DISCHARGE:                              CARDIAC CATHETERIZATION   PROCEDURE:  Drug-eluting stent placement in the proximal LAD x 2,  intravascular ultrasound of the LAD.   INDICATIONS:  [name] is a 70 year old gentleman who presents with stable  angina.  He underwent diagnostic angiography last week by Dr. Antoine Poche.  This demonstrated a 90% stenosis of the proximal LAD.  I was asked to  perform percutaneous intervention.  He returns today for that planned  procedure, having been loaded on Plavix as an outpatient.   PROCEDURAL TECHNIQUE:  Informed consent was obtained.  The patient had been  preloaded on Plavix.  Angiomax was initiated.  Under 1% lidocaine local  anesthesia, a 7 French sheath was placed in the right common femoral artery  using the modified Seldinger technique.  ACT was confirmed to be greater  than 225 seconds.  A CLS 3.5 guide was advanced over a wire and engaged in  the ostium of the left main.  A Prowater wire was advanced to the distal LAD  without difficulty.  The lesion was pre-dilated using a 2 x 9 mm Maverick at  8 atmospheres.  I initially planned on leaving untreated a moderate lesion  just after a moderate-size septal perforator and landing the stent just  proximal to the septal.  Therefore, a 2.5 x 18 mm Cypher was positioned thus  and deployed at 16 atmospheres.  The stent was then postdilated using a 2.5  mm Powersail at 18 atmospheres.  Intravascular ultrasound was then  performed.  This demonstrated the distal reference vessel to measure 3.5 x  2.6 mm and the proximal reference to measure 3.6 x 3.  I therefore further  dilated using a 3.25 mm Powersail up to 18  atmospheres (nominal diameter  3.43 mm).  Repeat intravascular ultrasound was performed, demonstrating much  better expansion.  However, the most proximal 2 mm of the stent had not been  further expanded.  Therefore, the Powersail was reintroduced and again  inflated to 18 atmospheres, taking care to cover the most proximal portion  of the stent, but not extend into the unstented vessel.  Repeat angiogram  demonstrated some plaque shift into the vessel just distal, with now  approximately 90% stenosis on the distal side of the spared septal  perforator.  This did not respond to injections of intracoronary  nitroglycerin.  I therefore stented it using a 3 x 13 mm Cypher, overlapping  the previously placed stent by 2 mm.  It was deployed at 18 atmospheres.  This stent was then postdilated using a 3.25 mm Powersail at 20 atmospheres  in the region of overlap and 18 atmospheres more distally.  Final  angiography demonstrated no residual stenosis, TIMI III flow to the distal  vasculature, and no dissection.  One  of two jailed small diagonals was  occluded; the other had TIMI III flow.  The patient tolerated the procedure  well and was transferred to the holding room in stable condition, without  any chest discomfort.   COMPLICATIONS:  None.   IMPRESSION/RECOMMENDATIONS:  Successful placement of two drug-eluting stents  in the proximal left anterior descending.  Plavix should be continued for a  minimum of a year.  Aspirin will be continued indefinitely.       WED/MEDQ  D:  12/12/2004  T:  12/12/2004  Job:  [id number]   cc:   Angus G. Renard Matter, MD  [address]  [place]  [place] [address]  Fax: [phone]   Vida Roller, M.D.  Fax: [phone]

## 2010-10-28 NOTE — Op Note (Signed)
NAME:  [name], [name] NO.:  [id number]   MEDICAL RECORD NO.:  [id number]          PATIENT TYPE:  AMB   LOCATION:  DAY                           FACILITY:  [facility]   PHYSICIAN:  R. Roetta Sessions, M.D. DATE OF BIRTH:  1940/07/03   DATE OF PROCEDURE:  04/11/2006  DATE OF DISCHARGE:                                 OPERATIVE REPORT   PROCEDURE PERFORMED:  Colonoscopy with lesion ablation, biopsy.   INDICATIONS FOR PROCEDURE:  The patient is a 70 year old gentleman who comes  for colorectal cancer screening.  He has lower GI tract symptoms and a  family history of colorectal neoplasia.  He has never had his colon imaged  previously.  Stopped his aspirin and Plavix three days ago.  Unfortunately,  he fell and bruised his left thigh and hip.  Saw Dr. Catalina Pizza.  Fortunately, x-rays were negative for fracture.  He is doing well from that  standpoint now.  Please see documentation in the medical records.   PROCEDURE NOTE:  Oxygen saturations, blood pressure, pulse and respirations  were monitored throughout the entire procedure.  Conscious sedation Versed 4  mg IV, Demerol 75 mg IV in divided doses.   INSTRUMENT USED:  Olympus video system.   FINDINGS:  Digital exam revealed no abnormalities.   ENDOSCOPIC FINDINGS:  Prep was adequate.   Rectum:  Examination of the rectal mucosa including retroflex view of the  anal verge revealed multiple diminutive 1 to 2 mm pale polyps, a good dozen  or so throughout the rectum.  Please see photos.  Otherwise rectal mucosa  appeared normal.  Colon:  Colonic mucosa was surveyed from the rectosigmoid junction to the  left, transverse and right colon to the area of the appendiceal orifice and  ileocecal valve and cecum.  These structures were well seen and photographed  for the record.  From this level, the scope was slowly and cautiously  withdrawn.  All previously mentioned mucosal surfaces were again seen.  The  patient had a 5 mm  polyp at the splenic flexure which was cold  biopsied/removed.  The patient had scattered narrow mouthed sigmoid  diverticula.  Remainder of colonic mucosa appeared normal.  Multiple  diminutive polyps in the rectum were ablated with the tip of a snare cautery  unit.  The patient tolerated the procedure well, was reacted in endoscopy.   IMPRESSION:  Multiple diminutive rectal polyps destroyed as described above.  Otherwise normal rectum.  Few scattered left-sided (sigmoid) diverticula.  A  5 mm polyp at the splenic flexure cold biopsied/removed.  Remainder of the  colonic mucosa appeared normal.   RECOMMENDATIONS:  1. Diverticulosis literature provided to [name].  2. Follow-up on pathology.  3. Resume Plavix and aspirin tomorrow.      Jonathon Bellows, M.D.  Electronically Signed     RMR/MEDQ  D:  04/11/2006  T:  04/11/2006  Job:  [id number]   cc:   Catalina Pizza, M.D.  Fax: [phone]

## 2010-10-28 NOTE — Discharge Summary (Signed)
NAME:  [name], [name] NO.:  [id number]   MEDICAL RECORD NO.:  [id number]          PATIENT TYPE:  INP   LOCATION:  3712                         FACILITY:  [facility]   PHYSICIAN:  Salvadore Farber, M.D. LHCDATE OF BIRTH:  05/28/1941   DATE OF ADMISSION:  12/12/2004  DATE OF DISCHARGE:  12/13/2004                                 DISCHARGE SUMMARY   PROCEDURES:  Cypher stenting, left anterior descending artery, December 12, 2004.   REASON FOR ADMISSION:  [name] is a 70 year old male who underwent recent  diagnostic cardiac catheterization revealing significant left anterior  descending disease and 100% occlusion of the right coronary artery with  normal left ventricular function.  Arrangements were thus made for the  patient to return for elective percutaneous intervention.  Please refer to  Dr. Tinnie Gens Hardin's initial admission note for full details.   LABORATORY DATA:  Hemoglobin 13.6, hematocrit 40, WBC 13.9, platelets 280.  Sodium 140, potassium 3.5, glucose 96, BUN 12, creatinine 0.8 at discharge.  CPK 214/23.  Troponin I 0.81 (postprocedure).   HOSPITAL COURSE:  The patient presented for elective percutaneous  intervention of the left anterior descending artery and underwent successful  Cypher stenting of the proximal left anterior descending by Dr. Samule Ohm with  no noted complications.   The patient was kept for overnight observation and cleared for discharge the  following day in hemodynamically stable condition.  There were no noted  complications of the right groin incision site.   Of note, postprocedure MB (23) and troponin I (0.81) were elevated.  Dr.  Samule Ohm attributed this to occlusion of a small diagonal artery.  The patient  remained asymptomatic with no complaint of chest pain with ambulation.   The patient also remained in sinus bradycardia which was asymptomatic and  without any sinus or AV nodal blocking agents added.   DISCHARGE MEDICATIONS:  1.  Aspirin 81 mg daily.  2.  Plavix 75 mg daily.  3.  Vytorin 10/40 mg daily.  4.  Nitroglycerin 0.4 mg as needed.   DISCHARGE INSTRUCTIONS:  1.  As outlined in cardiac catheterization discharge sheet.  2.  Follow up with Dr. Vida Roller on December 21, 2004, at 2:30 p.m.  3.  Arrange follow up with Dr. Butch Penny in the following 1-1/2 to 2      weeks.   DISCHARGE DIAGNOSES:  1.  Two-vessel coronary artery disease.      1.  Status post Cypher stenting in proximal left anterior descending          artery (x2), December 12, 2004.      2.  Residual 100% occlusion of right coronary artery.      3.  Normal left ventricular function.      4.  Elevated cardiac markers (postprocedure).  2.  Asymptomatic sinus bradycardia.  3.  Hyperlipidemia.  4.  History of tobacco.       GS/MEDQ  D:  12/13/2004  T:  12/13/2004  Job:  [id number]   cc:   Angus G. Renard Matter, MD  [address]  [place]  [place] [address]  Fax: [phone]

## 2010-10-28 NOTE — Op Note (Signed)
[place]  Patient:    HRISTOPHER, MISSILDINE                        MRN: [id number] Adm. Date:  [id number] Attending:  Teena Dunk                           Operative Report  PREOPERATIVE DIAGNOSIS:  Comminuted inferior pole fracture, inferior pole patella, left knee.  POSTOPERATIVE DIAGNOSIS:  Comminuted inferior pole fracture, inferior pole patella, left knee.  OPERATION:  Partial patellectomy with patellar tendon repair.  SURGEON:  Sharlot Gowda., M.D.  ASSISTANT:  Arnoldo Morale, P.A.  INDICATIONS:  This patient is a 70 year old who had had a partial quadriceps tendon tear several years ago from a work injury, which was repaired.  In point of fact, in that same work injury, he had experienced a femoral nerve injury proximal to the quadriceps tendon repair with mild return of function. He had about a 4-/5 strength in the quadriceps at most.  Unfortunately, most recently getting up out of bed with no real injury due to the weakness in the leg, the leg gave.  He fell on the knee, suffering a severely displaced comminuted inferior pole transverse patellar fracture.  Choices were between fixing this with a moderate amount of comminution versus partial patellectomy. It was my feeling that the patient needed to have a strong quadriceps more than anything else and that certainly partial patellectomy may sacrifice some flexion, but the real goal was to maintain even acceptable quadriceps strength given the patients preoperative weakness, that this was indicated that we accomplish it as an overnight.  DESCRIPTION OF PROCEDURE:  Sterile prep and drape.  Exsanguination of the leg, inflation to 350 mmHg.  Midline incision.  There was moderate scarring suprapatellarly in the subcutaneous tissues due to the previous quadriceps tendon repair, which had healed well.  There was a comminuted inferior pole fracture with three to four fragments.   It would have been very difficult to reconstruct this.  In any event, due to the above-mentioned discussion, it was elected to excise the fragments.  This was estimated that not quite 50% of the articular surface of the patella was removed.  Three #5 sutures were placed through drill holes in the patella and through the patellar tendon in a Bunnell fashion, tied securely with the knee in extension under no undue tension, oversewing the other medial and lateral retinacular defects with #2 Tycron, subcutaneous tissues with 2-0 Vicryl, skin with a stapling device. Marcaine without epinephrine _____ the knee.  A light compressive sterile Jones dressing, knee immobilizer applied.  Taken to the recovery room in stable condition. DD:  12/15/99 TD:  12/15/99 Job: [id number]

## 2010-10-28 NOTE — Cardiovascular Report (Signed)
NAME:  [name], [name] NO.:  [id number]   MEDICAL RECORD NO.:  [id number]          PATIENT TYPE:  OIB   LOCATION:  6501                         FACILITY:  [facility]   PHYSICIAN:  Vida Roller, M.D.   DATE OF BIRTH:  [birth year]   DATE OF PROCEDURE:  DATE OF DISCHARGE:                              CARDIAC CATHETERIZATION   PRIMARY CARE PHYSICIAN:  Angus G. McInnis, MD   HISTORY OF PRESENT ILLNESS:  Mr. Buddenhagen is a 70 year old man with multiple  cardiac risk factors including hyperlipidemia and previous tobacco abuse who  presented with classic angina, to my office on the 26th.  He was referred  for heart catheterization.   PROCEDURES PERFORMED:  1.  Left heart catheterization  2.  Selective coronary angiography.  3.  Left ventriculography.   DETAILS OF THE PROCEDURE:  After obtaining informed consent the patient was  brought to the cardiac catheterization laboratory in the fasting state.  There he was prepped and draped in the usual sterile manner; and a local  anesthetic was obtained over the right groin using 1% lidocaine without  epinephrine.  The right femoral artery was cannulated using the modified  Seldinger technique with a 4-French 10-cm sheath and a left heart  catheterization was performed using a 4-French Judkins left #4, a 4-French  Judkins right #4, and a 4-French pigtail catheter.  At the conclusion of the  procedure the catheters were removed. The pigtail catheter was used for left  ventriculography in the 30-degrees RAO view. Hemostasis was obtained using  direct manual pressure.  At the conclusion, there was no evidence of  ecchymosis or hematoma formation and distal pulses were intact. Total  fluoroscopic time was 6.3 minutes.  Total ionized contrast use was 100 mL.   RESULTS:  1.  Aortic pressure 126/3 with an end-diastolic pressure of 9 mmHg.  2.  Aortic pressure 116/63 with a mean arterial pressure of 83 mmHg.   SELECTIVE CORONARY  ANGIOGRAPHY:  1.  The left main coronary artery is a moderate caliber vessel which is      without significant disease.  2.  The left circumflex coronary artery is a large caliber dominant vessel      with a moderate caliber posterior descending and a posterior lateral      branch. There were several small obtuse marginal branches that have      luminal irregularities.  3.  The left anterior descending coronary artery is a moderate caliber      vessel which has an 80% stenosis in its midportion between the first and      second diagonals involving the ostium of the second diagonal.  The      remainder of the artery has luminal irregularities.  4.  The right coronary artery is a moderate caliber nondominant vessel with      a relatively moderate caliber RV branch.  5.  The left ventriculogram reveals normal LV systolic function, estimated      at 60% with no wall motion abnormalities and no mitral regurgitation.   ASSESSMENT:  1.  Single-vessel coronary disease.  2.  Normal LV systolic function.   PLAN:  My plan is to start this man on Plavix, give him a Plavix load of 300  mg now; put him on 75 mg over the weekend. I will get him revascularized  percutaneously later on in the week. I will show these films to my colleague  Dr. Randa Evens.       JH/MEDQ  D:  12/08/2004  T:  12/08/2004  Job:  [id number]   cc:   Angus G. Renard Matter, MD  [address]  [place]  [place] [address]  Fax: [phone]

## 2010-11-22 ENCOUNTER — Encounter: Payer: Self-pay | Admitting: Cardiovascular Disease

## 2010-11-23 ENCOUNTER — Encounter: Payer: Self-pay | Admitting: Cardiovascular Disease

## 2010-11-23 ENCOUNTER — Ambulatory Visit (INDEPENDENT_AMBULATORY_CARE_PROVIDER_SITE_OTHER): Payer: Medicare PPO | Admitting: Cardiovascular Disease

## 2010-11-23 VITALS — BP 132/79 | HR 89 | Resp 14 | Ht 68.0 in | Wt 193.0 lb

## 2010-11-23 DIAGNOSIS — E785 Hyperlipidemia, unspecified: Secondary | ICD-10-CM

## 2010-11-23 DIAGNOSIS — I10 Essential (primary) hypertension: Secondary | ICD-10-CM | POA: Insufficient documentation

## 2010-11-23 DIAGNOSIS — I2541 Coronary artery aneurysm: Secondary | ICD-10-CM

## 2010-11-23 DIAGNOSIS — I2510 Atherosclerotic heart disease of native coronary artery without angina pectoris: Secondary | ICD-10-CM

## 2010-11-23 MED ORDER — NITROGLYCERIN 0.4 MG SL SUBL: 0.4000 mg | SUBLINGUAL_TABLET | SUBLINGUAL | Status: DC | PRN

## 2010-11-23 NOTE — Assessment & Plan Note (Signed)
CAD with stent to LAD Normal myovue 2010. No angina.  Consider adding back beta blocker in future.  Patient prefers to stay on Plavix

## 2010-11-23 NOTE — Assessment & Plan Note (Signed)
Continue Vytorin  Labs with Dr Margo Aye in 2 weeks

## 2010-11-23 NOTE — Assessment & Plan Note (Signed)
Low sodium diet.  Encouraged him to monitor at home. If high add beta blocker given CAD

## 2010-11-23 NOTE — Patient Instructions (Signed)
Your physician wants you to follow-up in: ONE YEAR You will receive a reminder letter in the mail two months in advance. If you don't receive a letter, please call our office to schedule the follow-up appointment.  

## 2010-11-23 NOTE — Progress Notes (Signed)
[name] is seen today for F/U of HTN, elevated lipids and CAD he has had previous stenting of the LAD. Stent was patent on cath in 2009 and he had a non-ischemic myovue in 11/2008. He has been doing well with no significatn SSCP. His activity is limited by previous polio. He has pain in his calves with ambulation. Sometimes it occurs at rest. Arterial Duplex and ABI's last visit showed no significant PVD. He denises PND, orthopnea, palpitations, dypnea or edema. He has been compliant with his meds   ROS: Denies fever, malais, weight loss, blurry vision, decreased visual acuity, cough, sputum, SOB, hemoptysis, pleuritic pain, palpitaitons, heartburn, abdominal pain, melena, lower extremity edema, claudication, or rash.  All other systems reviewed and negative  General: Affect appropriate Healthy:  appears stated age HEENT: normal Neck supple with no adenopathy JVP normal no bruits no thyromegaly Lungs clear with no wheezing and good diaphragmatic motion Heart:  S1/S2 no murmur,rub, gallop or click PMI normal Abdomen: benighn, BS positve, no tenderness, no AAA no bruit.  No HSM or HJR Distal pulses intact with no bruits No edema Neuro non-focal Skin warm and dry Right UE weakness ant atrophy from polio as well as RLE   Current Outpatient Prescriptions  Medication Sig Dispense Refill  . aspirin 81 MG tablet Take 81 mg by mouth daily.        . clopidogrel (PLAVIX) 75 MG tablet Take 75 mg by mouth daily.        Marland Kitchen ezetimibe-simvastatin (VYTORIN) 10-40 MG per tablet Take 1 tablet by mouth at bedtime.        . nitroGLYCERIN (NITROSTAT) 0.4 MG SL tablet Place 0.4 mg under the tongue every 5 (five) minutes as needed.          Allergies  Review of patient's allergies indicates no known allergies.  Electrocardiogram:  NSR 89 RAD RVH  Assessment and Plan

## 2010-11-25 ENCOUNTER — Other Ambulatory Visit: Payer: Self-pay | Admitting: Cardiovascular Disease

## 2010-11-25 ENCOUNTER — Ambulatory Visit: Payer: Self-pay | Admitting: Adult Health

## 2010-11-29 ENCOUNTER — Other Ambulatory Visit: Payer: Self-pay | Admitting: *Deleted

## 2010-11-29 MED ORDER — CLOPIDOGREL BISULFATE 75 MG PO TABS: 75.0000 mg | ORAL_TABLET | Freq: Every day | ORAL | Status: DC

## 2011-03-17 LAB — CK TOTAL AND CKMB (NOT AT ARMC)
CK, MB: 14.4 — ABNORMAL HIGH
Relative Index: 4.1 — ABNORMAL HIGH
Total CK: 349 — ABNORMAL HIGH

## 2011-03-17 LAB — CARDIAC PANEL(CRET KIN+CKTOT+MB+TROPI)
CK, MB: 10.5 — ABNORMAL HIGH
CK, MB: 11.4 — ABNORMAL HIGH
CK, MB: 11.5 — ABNORMAL HIGH
Relative Index: 2.2
Relative Index: 2.5
Total CK: 424 — ABNORMAL HIGH
Total CK: 512 — ABNORMAL HIGH
Total CK: 544 — ABNORMAL HIGH
Troponin I: 0.01
Troponin I: 0.03

## 2011-03-17 LAB — BASIC METABOLIC PANEL
BUN: 17
CO2: 25
Calcium: 9.1
Chloride: 107
Creatinine, Ser: 0.72
GFR calc Af Amer: >60
GFR calc non Af Amer: >60
Glucose, Bld: 107 — ABNORMAL HIGH
Potassium: 3.7
Sodium: 138

## 2011-03-17 LAB — TROPONIN I: Troponin I: 0.03

## 2011-03-17 LAB — DIFFERENTIAL
Basophils Absolute: 0.1
Basophils Relative: 1
Eosinophils Absolute: 0.9 — ABNORMAL HIGH
Eosinophils Relative: 7 — ABNORMAL HIGH
Lymphocytes Relative: 33
Lymphs Abs: 4.3 — ABNORMAL HIGH
Monocytes Absolute: 1
Monocytes Relative: 8
Neutro Abs: 6.6
Neutrophils Relative %: 52

## 2011-03-17 LAB — CBC
HCT: 44.3
Hemoglobin: 14.7
MCHC: 33.3
MCV: 91.9
Platelets: 263
RBC: 4.81
RDW: 13
WBC: 12.8 — ABNORMAL HIGH

## 2011-03-17 LAB — POCT CARDIAC MARKERS
CKMB, poc: 9.1
Myoglobin, poc: 256
Operator id: 270681
Troponin i, poc: <0.05

## 2011-03-17 LAB — LIPID PANEL
Total CHOL/HDL Ratio: 4.5
VLDL: 19

## 2011-03-17 LAB — MAGNESIUM: Magnesium: 2

## 2011-04-25 ENCOUNTER — Encounter: Payer: Self-pay | Admitting: Cardiovascular Disease

## 2011-08-11 ENCOUNTER — Other Ambulatory Visit (HOSPITAL_COMMUNITY): Payer: Self-pay | Admitting: Internal Medicine

## 2011-08-11 DIAGNOSIS — R609 Edema, unspecified: Secondary | ICD-10-CM

## 2011-08-17 ENCOUNTER — Other Ambulatory Visit (HOSPITAL_COMMUNITY): Payer: Self-pay | Admitting: Internal Medicine

## 2011-08-17 DIAGNOSIS — T148XXA Other injury of unspecified body region, initial encounter: Secondary | ICD-10-CM

## 2011-08-18 ENCOUNTER — Other Ambulatory Visit (HOSPITAL_COMMUNITY): Payer: Self-pay | Admitting: Internal Medicine

## 2011-08-18 ENCOUNTER — Ambulatory Visit (HOSPITAL_COMMUNITY)
Admission: RE | Admit: 2011-08-18 | Discharge: 2011-08-18 | Disposition: A | Discharge status: Home / Self Care | Payer: Medicare Other | Source: Ambulatory Visit | Attending: *Deleted | Admitting: *Deleted

## 2011-08-18 ENCOUNTER — Other Ambulatory Visit (HOSPITAL_COMMUNITY): Payer: Self-pay | Admitting: *Deleted

## 2011-08-18 ENCOUNTER — Ambulatory Visit (HOSPITAL_COMMUNITY): Payer: Medicare Other

## 2011-08-18 ENCOUNTER — Ambulatory Visit (HOSPITAL_COMMUNITY)
Admission: RE | Admit: 2011-08-18 | Discharge: 2011-08-18 | Disposition: A | Discharge status: Home / Self Care | Payer: Medicare Other | Source: Ambulatory Visit | Attending: Internal Medicine | Admitting: Internal Medicine

## 2011-08-18 DIAGNOSIS — T148XXA Other injury of unspecified body region, initial encounter: Secondary | ICD-10-CM

## 2011-08-18 DIAGNOSIS — X58XXXA Exposure to other specified factors, initial encounter: Secondary | ICD-10-CM | POA: Insufficient documentation

## 2011-08-18 DIAGNOSIS — M25579 Pain in unspecified ankle and joints of unspecified foot: Secondary | ICD-10-CM

## 2011-08-18 DIAGNOSIS — M25476 Effusion, unspecified foot: Secondary | ICD-10-CM | POA: Insufficient documentation

## 2011-08-18 DIAGNOSIS — S9000XA Contusion of unspecified ankle, initial encounter: Secondary | ICD-10-CM | POA: Insufficient documentation

## 2011-08-18 DIAGNOSIS — M25473 Effusion, unspecified ankle: Secondary | ICD-10-CM | POA: Insufficient documentation

## 2011-08-21 ENCOUNTER — Other Ambulatory Visit (HOSPITAL_COMMUNITY): Payer: Medicare Other

## 2011-11-23 ENCOUNTER — Ambulatory Visit: Payer: Medicare Other | Admitting: Cardiovascular Disease

## 2011-11-29 ENCOUNTER — Encounter: Payer: Self-pay | Admitting: Cardiovascular Disease

## 2011-11-29 ENCOUNTER — Ambulatory Visit (INDEPENDENT_AMBULATORY_CARE_PROVIDER_SITE_OTHER): Payer: Medicare Other | Admitting: Cardiovascular Disease

## 2011-11-29 VITALS — BP 130/77 | HR 72 | Resp 16 | Ht 68.0 in | Wt 195.0 lb

## 2011-11-29 DIAGNOSIS — I2510 Atherosclerotic heart disease of native coronary artery without angina pectoris: Secondary | ICD-10-CM

## 2011-11-29 DIAGNOSIS — E785 Hyperlipidemia, unspecified: Secondary | ICD-10-CM

## 2011-11-29 DIAGNOSIS — I10 Essential (primary) hypertension: Secondary | ICD-10-CM

## 2011-11-29 DIAGNOSIS — I2541 Coronary artery aneurysm: Secondary | ICD-10-CM

## 2011-11-29 MED ORDER — NITROGLYCERIN 0.4 MG SL SUBL: 0.4000 mg | SUBLINGUAL_TABLET | SUBLINGUAL | Status: DC | PRN

## 2011-11-29 NOTE — Assessment & Plan Note (Signed)
Cholesterol is at goal.  Continue current dose of statin and diet Rx.  No myalgias or side effects.  F/U  LFT's in 6 months. Lab Results  Component Value Date   [facility] 66 02/23/2009   Recent labs with Dr hall

## 2011-11-29 NOTE — Progress Notes (Signed)
Patient ID: [name], male   DOB: 04-Aug-1940, 71 y.o.   MRN: [id number] [name] is seen today for F/U of HTN, elevated lipids and CAD he has had previous stenting of the LAD. Stent was patent on cath in 2009 and he had a non-ischemic myovue in 11/2008. He has been doing well with no significatn SSCP. His activity is limited by previous polio. He has pain in his calves with ambulation. Sometimes it occurs at rest. Arterial Duplex and ABI's last visit showed no significant PVD. He denises PND, orthopnea, palpitations, dypnea or edema. He has been compliant with his meds  Staying busy doing remodeling work    ROS: Denies fever, malais, weight loss, blurry vision, decreased visual acuity, cough, sputum, SOB, hemoptysis, pleuritic pain, palpitaitons, heartburn, abdominal pain, melena, lower extremity edema, claudication, or rash.  All other systems reviewed and negative  General: Affect appropriate Healthy:  appears stated age HEENT: normal Neck supple with no adenopathy JVP normal no bruits no thyromegaly Lungs clear with no wheezing and good diaphragmatic motion Heart:  S1/S2 no murmur, no rub, gallop or click PMI normal Abdomen: benighn, BS positve, no tenderness, no AAA no bruit.  No HSM or HJR Distal pulses intact with no bruits Trace bilateral  edema Neuro non-focal Skin warm and dry Atrophied right arm/sholder from polio   Current Outpatient Prescriptions  Medication Sig Dispense Refill  . aspirin 81 MG tablet Take 81 mg by mouth daily.        . clopidogrel (PLAVIX) 75 MG tablet Take 1 tablet (75 mg total) by mouth daily.  30 tablet  6  . ezetimibe-simvastatin (VYTORIN) 10-40 MG per tablet Take 1 tablet by mouth at bedtime.        . nitroGLYCERIN (NITROSTAT) 0.4 MG SL tablet Place 1 tablet (0.4 mg total) under the tongue every 5 (five) minutes as needed.  25 tablet  12    Allergies  Review of patient's allergies indicates no known allergies.  Electrocardiogram:  NSR rate 72  RAD   Assessment and Plan

## 2011-11-29 NOTE — Assessment & Plan Note (Addendum)
Stable with no angina and good activity level.  Continue medical Rx New nitro called into the [facility]

## 2011-11-29 NOTE — Patient Instructions (Addendum)
Your physician recommends that you schedule a follow-up appointment in 1 YEAR.  

## 2011-11-29 NOTE — Assessment & Plan Note (Signed)
Well controlled.  Continue current medications and low sodium Dash type diet.    

## 2011-12-03 ENCOUNTER — Other Ambulatory Visit: Payer: Self-pay | Admitting: Cardiovascular Disease

## 2012-11-27 ENCOUNTER — Ambulatory Visit (INDEPENDENT_AMBULATORY_CARE_PROVIDER_SITE_OTHER): Payer: Medicare Other | Admitting: Cardiovascular Disease

## 2012-11-27 ENCOUNTER — Encounter: Payer: Self-pay | Admitting: Cardiovascular Disease

## 2012-11-27 VITALS — BP 137/84 | HR 72 | Ht 68.0 in | Wt 200.0 lb

## 2012-11-27 DIAGNOSIS — I10 Essential (primary) hypertension: Secondary | ICD-10-CM

## 2012-11-27 DIAGNOSIS — I2541 Coronary artery aneurysm: Secondary | ICD-10-CM

## 2012-11-27 DIAGNOSIS — E785 Hyperlipidemia, unspecified: Secondary | ICD-10-CM

## 2012-11-27 NOTE — Patient Instructions (Addendum)
Your physician recommends that you schedule a follow-up appointment in: 6 months  

## 2012-11-27 NOTE — Progress Notes (Signed)
Patient ID: [name], male   DOB: 1940-07-02, 72 y.o.   MRN: [id number] [name] is seen today for F/U of HTN, elevated lipids and CAD he has had previous stenting of the LAD. Stent was patent on cath in 2009 and he had a non-ischemic myovue in 11/2008. He has been doing well with no significatn SSCP. His activity is limited by previous polio. He has pain in his calves with ambulation. Sometimes it occurs at rest. Arterial Duplex and ABI's last visit showed no significant PVD. He denises PND, orthopnea, palpitations, dypnea or edema. He has been compliant with his meds Staying busy doing remodeling work   Labs with DR Margo Aye next month  ROS: Denies fever, malais, weight loss, blurry vision, decreased visual acuity, cough, sputum, SOB, hemoptysis, pleuritic pain, palpitaitons, heartburn, abdominal pain, melena, lower extremity edema, claudication, or rash.  All other systems reviewed and negative  General: Affect appropriate Healthy:  appears stated age HEENT: normal Neck supple with no adenopathy JVP normal no bruits no thyromegaly Lungs clear with no wheezing and good diaphragmatic motion Heart:  S1/S2 no murmur, no rub, gallop or click PMI normal Abdomen: benighn, BS positve, no tenderness, no AAA no bruit.  No HSM or HJR Distal pulses intact with no bruits No edema Neuro non-focal Skin warm and dry Polio with weak right arm and sholder S/P left TKR  Current Outpatient Prescriptions  Medication Sig Dispense Refill  . aspirin 81 MG tablet Take 81 mg by mouth daily.        . clopidogrel (PLAVIX) 75 MG tablet TAKE ONE TABLET BY MOUTH EVERY DAY  30 tablet  12  . ezetimibe-simvastatin (VYTORIN) 10-40 MG per tablet Take 1 tablet by mouth at bedtime.        . nitroGLYCERIN (NITROSTAT) 0.4 MG SL tablet Place 1 tablet (0.4 mg total) under the tongue every 5 (five) minutes as needed.  100 tablet  3   No current facility-administered medications for this visit.    Allergies  Review of  patient's allergies indicates no known allergies.  Electrocardiogram:  SR rate 67 normal ECG   Assessment and Plan

## 2012-11-27 NOTE — Assessment & Plan Note (Signed)
Well controlled.  Continue current medications and low sodium Dash type diet.    

## 2012-11-27 NOTE — Assessment & Plan Note (Signed)
Cholesterol is at goal.  Continue current dose of statin and diet Rx.  No myalgias or side effects.  F/U  LFT's in 6 months. Lab Results  Component Value Date   LDLCALC 66 02/23/2009   Labs with primary next month

## 2012-11-27 NOTE — Assessment & Plan Note (Signed)
Stable with no angina and good activity level.  Continue medical Rx  

## 2013-01-03 ENCOUNTER — Other Ambulatory Visit: Payer: Self-pay | Admitting: *Deleted

## 2013-01-03 MED ORDER — CLOPIDOGREL BISULFATE 75 MG PO TABS: ORAL_TABLET | ORAL | Status: DC

## 2013-04-11 ENCOUNTER — Encounter: Payer: Self-pay | Admitting: Cardiovascular Disease

## 2013-04-28 ENCOUNTER — Ambulatory Visit: Payer: Medicare Other | Admitting: Cardiovascular Disease

## 2013-07-02 ENCOUNTER — Other Ambulatory Visit: Payer: Self-pay | Admitting: Cardiovascular Disease

## 2013-08-26 ENCOUNTER — Encounter: Payer: Self-pay | Admitting: Cardiovascular Disease

## 2013-08-27 ENCOUNTER — Encounter: Payer: Self-pay | Admitting: *Deleted

## 2013-11-07 ENCOUNTER — Other Ambulatory Visit: Payer: Self-pay | Admitting: Cardiovascular Disease

## 2013-12-04 ENCOUNTER — Encounter: Payer: Self-pay | Admitting: Cardiovascular Disease

## 2013-12-04 ENCOUNTER — Ambulatory Visit (INDEPENDENT_AMBULATORY_CARE_PROVIDER_SITE_OTHER): Payer: Medicare HMO | Admitting: Cardiovascular Disease

## 2013-12-04 VITALS — BP 132/72 | HR 61 | Ht 68.0 in | Wt 199.0 lb

## 2013-12-04 DIAGNOSIS — I10 Essential (primary) hypertension: Secondary | ICD-10-CM

## 2013-12-04 DIAGNOSIS — E785 Hyperlipidemia, unspecified: Secondary | ICD-10-CM

## 2013-12-04 DIAGNOSIS — I2541 Coronary artery aneurysm: Secondary | ICD-10-CM

## 2013-12-04 NOTE — Assessment & Plan Note (Signed)
Stable with no angina and good activity level.  Continue medical Rx Prefers to stay on Plavix

## 2013-12-04 NOTE — Assessment & Plan Note (Signed)
LDL 82 continue current Rxs at goal

## 2013-12-04 NOTE — Assessment & Plan Note (Signed)
Well controlled.  Continue current medications and low sodium Dash type diet.    

## 2013-12-04 NOTE — Patient Instructions (Signed)
Your physician wants you to follow-up in: 6 months in [place] will receive a reminder letter in the mail two months in advance. If you don't receive a letter, please call our office to schedule the follow-up appointment.     Your physician recommends that you continue on your current medications as directed. Please refer to the Current Medication list given to you today.      Thank you for choosing [facility] !

## 2013-12-04 NOTE — Progress Notes (Signed)
Patient ID: [name], male   DOB: 07-Nov-1940, 73 y.o.   MRN: [id number] Daniel is seen today for F/U of HTN, elevated lipids and CAD he has had previous stenting of the LAD. Stent was patent on cath in 2009 and he had a non-ischemic myovue in 11/2008. He has been doing well with no significatn SSCP. His activity is limited by previous polio. He has pain in his calves with ambulation. Sometimes it occurs at rest. Arterial Duplex and ABI's last visit showed no significant PVD. He denises PND, orthopnea, palpitations, dypnea or edema. He has been compliant with his meds Staying busy doing remodeling work  Labs with DR [place]  LDL 82 and normal LFTs      ROS: Denies fever, malais, weight loss, blurry vision, decreased visual acuity, cough, sputum, SOB, hemoptysis, pleuritic pain, palpitaitons, heartburn, abdominal pain, melena, lower extremity edema, claudication, or rash.  All other systems reviewed and negative  General: Affect appropriate Healthy:  appears stated age 55: normal Neck supple with no adenopathy JVP normal no bruits no thyromegaly Lungs clear with no wheezing and good diaphragmatic motion Heart:  S1/S2 no murmur, no rub, gallop or click PMI normal Abdomen: benighn, BS positve, no tenderness, no AAA no bruit.  No HSM or HJR Distal pulses intact with no bruits No edema Neuro non-focal Skin warm and dry Right shoulder girdle atrophy from Polio   Current Outpatient Prescriptions  Medication Sig Dispense Refill  . aspirin 81 MG tablet Take 81 mg by mouth daily.        . clopidogrel (PLAVIX) 75 MG tablet TAKE ONE TABLET BY MOUTH ONCE DAILY  30 tablet  6  . ezetimibe-simvastatin (VYTORIN) 10-40 MG per tablet Take 1 tablet by mouth at bedtime.        . nitroGLYCERIN (NITROSTAT) 0.4 MG SL tablet Place 1 tablet (0.4 mg total) under the tongue every 5 (five) minutes as needed.  100 tablet  3   No current facility-administered medications for this visit.     Allergies  Review of patient's allergies indicates no known allergies.  Electrocardiogram:  SR rate 73 normal   Assessment and Plan

## 2014-01-22 ENCOUNTER — Other Ambulatory Visit (HOSPITAL_COMMUNITY): Payer: Self-pay | Admitting: Internal Medicine

## 2014-01-22 DIAGNOSIS — R221 Localized swelling, mass and lump, neck: Secondary | ICD-10-CM

## 2014-01-26 ENCOUNTER — Ambulatory Visit (HOSPITAL_COMMUNITY)
Admission: RE | Admit: 2014-01-26 | Discharge: 2014-01-26 | Disposition: A | Discharge status: Home / Self Care | Payer: Medicare HMO | Source: Ambulatory Visit | Attending: Internal Medicine | Admitting: Internal Medicine

## 2014-01-26 DIAGNOSIS — R220 Localized swelling, mass and lump, head: Secondary | ICD-10-CM | POA: Insufficient documentation

## 2014-01-26 DIAGNOSIS — R221 Localized swelling, mass and lump, neck: Secondary | ICD-10-CM | POA: Diagnosis not present

## 2014-01-27 ENCOUNTER — Other Ambulatory Visit (HOSPITAL_COMMUNITY): Payer: Self-pay | Admitting: Internal Medicine

## 2014-01-27 DIAGNOSIS — R229 Localized swelling, mass and lump, unspecified: Secondary | ICD-10-CM

## 2014-01-28 ENCOUNTER — Ambulatory Visit (HOSPITAL_COMMUNITY)
Admission: RE | Admit: 2014-01-28 | Discharge: 2014-01-28 | Disposition: A | Discharge status: Home / Self Care | Payer: Medicare HMO | Source: Ambulatory Visit | Attending: Internal Medicine | Admitting: Internal Medicine

## 2014-01-28 DIAGNOSIS — R220 Localized swelling, mass and lump, head: Secondary | ICD-10-CM | POA: Insufficient documentation

## 2014-01-28 DIAGNOSIS — I6529 Occlusion and stenosis of unspecified carotid artery: Secondary | ICD-10-CM | POA: Insufficient documentation

## 2014-01-28 DIAGNOSIS — R221 Localized swelling, mass and lump, neck: Principal | ICD-10-CM

## 2014-01-28 DIAGNOSIS — I658 Occlusion and stenosis of other precerebral arteries: Secondary | ICD-10-CM | POA: Diagnosis not present

## 2014-01-28 DIAGNOSIS — R229 Localized swelling, mass and lump, unspecified: Secondary | ICD-10-CM

## 2014-01-28 LAB — POCT I-STAT CREATININE: CREATININE: 1 mg/dL (ref 0.50–1.35)

## 2014-01-28 MED ORDER — IOHEXOL 300 MG/ML  SOLN
80.0000 mL | Freq: Once | INTRAMUSCULAR | Status: AC | PRN
  Administered 2014-01-28: 80 mL via INTRAVENOUS

## 2014-05-15 ENCOUNTER — Encounter: Payer: Self-pay | Admitting: Cardiovascular Disease

## 2014-05-15 ENCOUNTER — Ambulatory Visit (INDEPENDENT_AMBULATORY_CARE_PROVIDER_SITE_OTHER): Payer: Medicare HMO | Admitting: Cardiovascular Disease

## 2014-05-15 VITALS — BP 150/82 | HR 60 | Ht 68.0 in | Wt 202.0 lb

## 2014-05-15 DIAGNOSIS — E785 Hyperlipidemia, unspecified: Secondary | ICD-10-CM

## 2014-05-15 DIAGNOSIS — R011 Cardiac murmur, unspecified: Secondary | ICD-10-CM

## 2014-05-15 DIAGNOSIS — A809 Acute poliomyelitis, unspecified: Secondary | ICD-10-CM

## 2014-05-15 DIAGNOSIS — I2541 Coronary artery aneurysm: Secondary | ICD-10-CM

## 2014-05-15 DIAGNOSIS — I10 Essential (primary) hypertension: Secondary | ICD-10-CM

## 2014-05-15 NOTE — Progress Notes (Signed)
Patient ID: [name], male   DOB: September 15, 1940, 73 y.o.   MRN: [id number] [place] is seen today for F/U of HTN, elevated lipids and CAD he has had previous stenting of the LAD. Stent was patent on cath in 2009 and he had a non-ischemic myovue in 11/2008. He has been doing well with no significatn SSCP. His activity is limited by previous polio. He has pain in his calves with ambulation. Sometimes it occurs at rest. Arterial Duplex and ABI's last visit showed no significant PVD. He denises PND, orthopnea, palpitations, dypnea or edema. He has been compliant with his meds Staying busy doing remodeling work  Labs with DR [place] LDL 82 and normal LFTs      ROS: Denies fever, malais, weight loss, blurry vision, decreased visual acuity, cough, sputum, SOB, hemoptysis, pleuritic pain, palpitaitons, heartburn, abdominal pain, melena, lower extremity edema, claudication, or rash.  All other systems reviewed and negative  General: Affect appropriate Healthy:  appears stated age 73: normal Neck supple with no adenopathy JVP normal no bruits no thyromegaly Lungs clear with no wheezing and good diaphragmatic motion Heart:  S1/S2 SEM and MR  murmur, no rub, gallop or click PMI normal Abdomen: benighn, BS positve, no tenderness, no AAA no bruit.  No HSM or HJR Distal pulses intact with no bruits No edema Neuro non-focal Skin warm and dry Polio with atrophied right shoulder and arm    Current Outpatient Prescriptions  Medication Sig Dispense Refill  . aspirin 81 MG tablet Take 81 mg by mouth daily.      . clopidogrel (PLAVIX) 75 MG tablet TAKE ONE TABLET BY MOUTH ONCE DAILY 30 tablet 6  . ezetimibe-simvastatin (VYTORIN) 10-40 MG per tablet Take 1 tablet by mouth at bedtime.      . nitroGLYCERIN (NITROSTAT) 0.4 MG SL tablet Place 1 tablet (0.4 mg total) under the tongue every 5 (five) minutes as needed. 100 tablet 3   No current facility-administered medications for this visit.     Allergies  Review of patient's allergies indicates no known allergies.  Electrocardiogram:  6/25  SR RAD nonspecific ST changes   Assessment and Plan

## 2014-05-15 NOTE — Assessment & Plan Note (Signed)
Stable with no angina and good activity level.  Continue medical Rx  

## 2014-05-15 NOTE — Patient Instructions (Signed)
Your physician wants you to follow-up in: 6 months You will receive a reminder letter in the mail two months in advance. If you don't receive a letter, please call our office to schedule the follow-up appointment.     Your physician recommends that you continue on your current medications as directed. Please refer to the Current Medication list given to you today.      Thank you for choosing [facility] !        

## 2014-05-15 NOTE — Assessment & Plan Note (Signed)
Well controlled.  Continue current medications and low sodium Dash type diet.    

## 2014-05-15 NOTE — Assessment & Plan Note (Signed)
New finding.  Sounds like AV sclerosis and MR  Asymptomatic consider echo next visit

## 2014-05-15 NOTE — Assessment & Plan Note (Signed)
PPMA right arm and shoulder chronic no need for PT  Compensated

## 2014-05-15 NOTE — Assessment & Plan Note (Signed)
Cholesterol is at goal.  Continue current dose of statin and diet Rx.  No myalgias or side effects.  F/U  LFT's in 6 months. Lab Results  Component Value Date   LDLCALC 66 02/23/2009

## 2014-06-08 ENCOUNTER — Other Ambulatory Visit: Payer: Self-pay | Admitting: Cardiovascular Disease

## 2014-12-01 NOTE — Progress Notes (Signed)
Patient ID: [name], male   DOB: 10/15/1940, 74 y.o.   MRN: [id number] [name] is seen today for F/U of HTN, elevated lipids and CAD he has had previous stenting of the LAD. Stent was patent on cath in 2009 and he had a non-ischemic myovue in 11/2008. He has been doing well with no significatn SSCP. His activity is limited by previous polio. He has pain in his calves with ambulation. Sometimes it occurs at rest. Arterial Duplex and ABI's last visit showed no significant PVD. He denises PND, orthopnea, palpitations, dypnea or edema. He has been compliant with his meds Staying busy doing remodeling work  Labs with DR [place] LDL 82 and normal LFTs  Asked if his heart was healthy enough for sex and I said yes but no viagra like meds   ROS: Denies fever, malais, weight loss, blurry vision, decreased visual acuity, cough, sputum, SOB, hemoptysis, pleuritic pain, palpitaitons, heartburn, abdominal pain, melena, lower extremity edema, claudication, or rash.  All other systems reviewed and negative  General: Affect appropriate Healthy:  appears stated age 74: normal Neck supple with no adenopathy JVP normal no bruits no thyromegaly Lungs clear with no wheezing and good diaphragmatic motion Heart:  S1/S2 SEM and MR  murmur, no rub, gallop or click PMI normal Abdomen: benighn, BS positve, no tenderness, no AAA no bruit.  No HSM or HJR Distal pulses intact with no bruits No edema Neuro non-focal Skin warm and dry Polio with atrophied right shoulder and arm    Current Outpatient Prescriptions  Medication Sig Dispense Refill  . aspirin 81 MG tablet Take 81 mg by mouth daily.      . clopidogrel (PLAVIX) 75 MG tablet TAKE ONE TABLET BY MOUTH ONCE DAILY 30 tablet 6  . ezetimibe-simvastatin (VYTORIN) 10-40 MG per tablet Take 1 tablet by mouth at bedtime.      . nitroGLYCERIN (NITROSTAT) 0.4 MG SL tablet Place 1 tablet (0.4 mg total) under the tongue every 5 (five) minutes as needed.  25 tablet 3   No current facility-administered medications for this visit.    Allergies  Review of patient's allergies indicates no known allergies.  Electrocardiogram:  12/04/13   SR RAD nonspecific ST changes   Assessment and Plan CAD: Stable with no angina and good activity level.  Continue medical Rx new nitro called in   HTN: Well controlled.  Continue current medications and low sodium Dash type diet.    Chol:  Cholesterol is at goal.  Continue current dose of statin and diet Rx.  No myalgias or side effects.  F/U  LFT's in 6 months. Lab Results  Component Value Date   [place] 66 02/23/2009   Labs with primary   Murmur: SEM AV sclerosis consider f/u echo in a year   [employer]

## 2014-12-04 ENCOUNTER — Ambulatory Visit (INDEPENDENT_AMBULATORY_CARE_PROVIDER_SITE_OTHER): Payer: Medicare HMO | Admitting: Cardiovascular Disease

## 2014-12-04 ENCOUNTER — Encounter: Payer: Self-pay | Admitting: Cardiovascular Disease

## 2014-12-04 VITALS — BP 138/68 | HR 73 | Ht 66.0 in | Wt 200.6 lb

## 2014-12-04 DIAGNOSIS — I10 Essential (primary) hypertension: Secondary | ICD-10-CM | POA: Diagnosis not present

## 2014-12-04 MED ORDER — NITROGLYCERIN 0.4 MG SL SUBL: 0.4000 mg | SUBLINGUAL_TABLET | SUBLINGUAL | Status: DC | PRN

## 2014-12-04 NOTE — Patient Instructions (Signed)
Your physician wants you to follow-up in: 1 year with Dr Luvenia Redden will receive a reminder letter in the mail two months in advance. If you don't receive a letter, please call our office to schedule the follow-up appointment.    Your physician recommends that you continue on your current medications as directed. Please refer to the Current Medication list given to you today.     Thank you for choosing [facility] !

## 2015-01-06 ENCOUNTER — Other Ambulatory Visit: Payer: Self-pay

## 2015-01-06 ENCOUNTER — Other Ambulatory Visit: Payer: Self-pay | Admitting: Cardiovascular Disease

## 2015-01-06 MED ORDER — CLOPIDOGREL BISULFATE 75 MG PO TABS: 75.0000 mg | ORAL_TABLET | Freq: Every day | ORAL | Status: DC

## 2015-01-11 ENCOUNTER — Other Ambulatory Visit: Payer: Self-pay

## 2015-01-11 MED ORDER — CLOPIDOGREL BISULFATE 75 MG PO TABS: 75.0000 mg | ORAL_TABLET | Freq: Every day | ORAL | Status: DC

## 2015-04-02 DIAGNOSIS — Z23 Encounter for immunization: Secondary | ICD-10-CM | POA: Diagnosis not present

## 2015-07-05 DIAGNOSIS — R69 Illness, unspecified: Secondary | ICD-10-CM | POA: Diagnosis not present

## 2015-07-07 DIAGNOSIS — R69 Illness, unspecified: Secondary | ICD-10-CM | POA: Diagnosis not present

## 2015-08-18 DIAGNOSIS — R7301 Impaired fasting glucose: Secondary | ICD-10-CM | POA: Diagnosis not present

## 2015-08-18 DIAGNOSIS — E782 Mixed hyperlipidemia: Secondary | ICD-10-CM | POA: Diagnosis not present

## 2015-08-18 DIAGNOSIS — I10 Essential (primary) hypertension: Secondary | ICD-10-CM | POA: Diagnosis not present

## 2015-08-20 DIAGNOSIS — R7301 Impaired fasting glucose: Secondary | ICD-10-CM | POA: Diagnosis not present

## 2015-08-20 DIAGNOSIS — E782 Mixed hyperlipidemia: Secondary | ICD-10-CM | POA: Diagnosis not present

## 2015-08-20 DIAGNOSIS — J302 Other seasonal allergic rhinitis: Secondary | ICD-10-CM | POA: Diagnosis not present

## 2015-08-20 DIAGNOSIS — I10 Essential (primary) hypertension: Secondary | ICD-10-CM | POA: Diagnosis not present

## 2015-11-15 NOTE — Progress Notes (Signed)
Patient ID: [name], male   DOB: 10-Oct-1940, 75 y.o.   MRN: [id number] [name] is seen today for F/U of HTN, elevated lipids and CAD he has had previous stenting of the LAD. Stent was patent on cath in 2009 and he had a non-ischemic myovue in 11/2008. He has been doing well with no significant SSCP. His activity is limited by previous polio. He has pain in his calves with ambulation. Sometimes it occurs at rest. Arterial Duplex and ABI's last visit showed no significant PVD. He denises PND, orthopnea, palpitations, dypnea or edema. He has been compliant with his meds Staying busy doing remodeling work   Labs with DR [place] LDL 82 and normal LFTs  Asked if his heart was healthy enough for sex and I said yes but no viagra like meds  Cholesterol checked by primary and good   ROS: Denies fever, malais, weight loss, blurry vision, decreased visual acuity, cough, sputum, SOB, hemoptysis, pleuritic pain, palpitaitons, heartburn, abdominal pain, melena, lower extremity edema, claudication, or rash.  All other systems reviewed and negative  General: Affect appropriate Healthy:  appears stated age 75: normal Neck supple with no adenopathy JVP normal no bruits no thyromegaly Lungs clear with no wheezing and good diaphragmatic motion Heart:  S1/S2 SEM and MR  murmur, no rub, gallop or click PMI normal Abdomen: benighn, BS positve, no tenderness, no AAA no bruit.  No HSM or HJR Distal pulses intact with no bruits No edema Neuro non-focal Skin warm and dry Polio with atrophied right shoulder and arm    Current Outpatient Prescriptions  Medication Sig Dispense Refill  . aspirin 81 MG tablet Take 81 mg by mouth daily.      . clopidogrel (PLAVIX) 75 MG tablet Take 1 tablet (75 mg total) by mouth daily. 30 tablet 11  . ezetimibe-simvastatin (VYTORIN) 10-40 MG per tablet Take 1 tablet by mouth at bedtime.      . nitroGLYCERIN (NITROSTAT) 0.4 MG SL tablet Place 1 tablet (0.4 mg  total) under the tongue every 5 (five) minutes as needed. 25 tablet 3   No current facility-administered medications for this visit.    Allergies  Review of patient's allergies indicates no known allergies.  Electrocardiogram:  12/04/13   SR RAD nonspecific ST changes  11/17/15  SR rate 63  RAD RVH   Assessment and Plan CAD: Stable with no angina and good activity level.  Continue medical Rx new nitro called in   HTN: Well controlled.  Continue current medications and low sodium Dash type diet.    Chol:   Cholesterol is at goal.  Labs with primary   Murmur: SEM AV sclerosis consider f/u echo in a year   [employer]

## 2015-11-17 ENCOUNTER — Ambulatory Visit (INDEPENDENT_AMBULATORY_CARE_PROVIDER_SITE_OTHER): Payer: Medicare HMO | Admitting: Cardiovascular Disease

## 2015-11-17 ENCOUNTER — Encounter: Payer: Self-pay | Admitting: Cardiovascular Disease

## 2015-11-17 VITALS — BP 112/80 | HR 67 | Wt 199.0 lb

## 2015-11-17 DIAGNOSIS — I10 Essential (primary) hypertension: Secondary | ICD-10-CM

## 2016-02-09 ENCOUNTER — Other Ambulatory Visit: Payer: Self-pay | Admitting: Cardiovascular Disease

## 2016-03-01 DIAGNOSIS — E782 Mixed hyperlipidemia: Secondary | ICD-10-CM | POA: Diagnosis not present

## 2016-03-01 DIAGNOSIS — Z125 Encounter for screening for malignant neoplasm of prostate: Secondary | ICD-10-CM | POA: Diagnosis not present

## 2016-03-01 DIAGNOSIS — I10 Essential (primary) hypertension: Secondary | ICD-10-CM | POA: Diagnosis not present

## 2016-03-01 DIAGNOSIS — R7301 Impaired fasting glucose: Secondary | ICD-10-CM | POA: Diagnosis not present

## 2016-03-03 DIAGNOSIS — J302 Other seasonal allergic rhinitis: Secondary | ICD-10-CM | POA: Diagnosis not present

## 2016-03-03 DIAGNOSIS — E782 Mixed hyperlipidemia: Secondary | ICD-10-CM | POA: Diagnosis not present

## 2016-03-03 DIAGNOSIS — I10 Essential (primary) hypertension: Secondary | ICD-10-CM | POA: Diagnosis not present

## 2016-03-03 DIAGNOSIS — K219 Gastro-esophageal reflux disease without esophagitis: Secondary | ICD-10-CM | POA: Diagnosis not present

## 2016-03-03 DIAGNOSIS — Z8673 Personal history of transient ischemic attack (TIA), and cerebral infarction without residual deficits: Secondary | ICD-10-CM | POA: Diagnosis not present

## 2016-03-03 DIAGNOSIS — Z23 Encounter for immunization: Secondary | ICD-10-CM | POA: Diagnosis not present

## 2016-03-03 DIAGNOSIS — R7301 Impaired fasting glucose: Secondary | ICD-10-CM | POA: Diagnosis not present

## 2016-03-03 DIAGNOSIS — Z0000 Encounter for general adult medical examination without abnormal findings: Secondary | ICD-10-CM | POA: Diagnosis not present

## 2016-03-27 ENCOUNTER — Telehealth: Payer: Self-pay | Admitting: Internal Medicine

## 2016-03-27 NOTE — Telephone Encounter (Signed)
Recall for tcs °

## 2016-03-27 NOTE — Telephone Encounter (Signed)
Letter mailed

## 2016-04-07 DIAGNOSIS — H18413 Arcus senilis, bilateral: Secondary | ICD-10-CM | POA: Diagnosis not present

## 2016-04-07 DIAGNOSIS — Z0100 Encounter for examination of eyes and vision without abnormal findings: Secondary | ICD-10-CM | POA: Diagnosis not present

## 2016-04-07 DIAGNOSIS — H5200 Hypermetropia, unspecified eye: Secondary | ICD-10-CM | POA: Diagnosis not present

## 2016-07-03 ENCOUNTER — Ambulatory Visit: Payer: Medicare HMO | Admitting: Physician Assistant

## 2016-07-03 ENCOUNTER — Encounter: Payer: Self-pay | Admitting: Physician Assistant

## 2016-07-03 NOTE — Progress Notes (Deleted)
Cardiology Office Note    Date:  07/03/2016   ID:  [name], DOB [birth year], MRN [id number]  PCP:  Wende Neighbors, MD  Cardiologist: Dr. Johnsie Cancel  No chief complaint on file.   History of Present Illness:  [name] is a 76 y.o. male with history of CAD status post stenting of the LAD in 2006 normal LV function. Nonischemic Myoview 2010. Also has hypertension, is limited by previous polio. Has had pain in his calves with ambulation and previous arterial duplex and ABIs showed no significant PVD. Last saw Dr. Johnsie Cancel 11/17/15 and was doing well. Mentioned and aortic valve sclerosis murmur and recommended follow-up echo in a year.    Past Medical History:  Diagnosis Date  . Coronary artery disease   . Hyperlipidemia   . Hypertension   . Polio    hx of. right arm atrophy    Past Surgical History:  Procedure Laterality Date  . CARDIAC CATHETERIZATION  07-09-2007  . CHOLECYSTECTOMY    . CORONARY STENT PLACEMENT     x3 vessels bifurcated single vessel  . KNEE ARTHROPLASTY     pinning surgery    Current Medications: Outpatient Medications Prior to Visit  Medication Sig Dispense Refill  . aspirin 81 MG tablet Take 81 mg by mouth daily.      . clopidogrel (PLAVIX) 75 MG tablet TAKE ONE TABLET BY MOUTH ONCE DAILY 90 tablet 2  . ezetimibe-simvastatin (VYTORIN) 10-40 MG per tablet Take 1 tablet by mouth at bedtime.      . nitroGLYCERIN (NITROSTAT) 0.4 MG SL tablet Place 1 tablet (0.4 mg total) under the tongue every 5 (five) minutes as needed. 25 tablet 3   No facility-administered medications prior to visit.      Allergies:   Patient has no known allergies.   Social History   Social History  . Marital status: Married    Spouse name: N/A  . Number of children: 2  . Years of education: N/A   Occupational History  . retired Furniture conservator/restorer    Social History Main Topics  . Smoking status: Former Smoker    Types: Cigarettes    Quit date: 06/12/2004  . Smokeless tobacco:  Not on file  . Alcohol use No  . Drug use: No  . Sexual activity: Not on file   Other Topics Concern  . Not on file   Social History Narrative  . No narrative on file     Family History:  The patient's  family history includes Heart attack (age of onset: 27) in his brother.   ROS:   Please see the history of present illness.    Review of Systems  Constitution: Negative.  HENT: Negative.   Cardiovascular: Negative.   Respiratory: Negative.   Endocrine: Negative.   Hematologic/Lymphatic: Negative.   Musculoskeletal: Negative.   Gastrointestinal: Negative.   Genitourinary: Negative.   Neurological: Negative.    All other systems reviewed and are negative.   PHYSICAL EXAM:   VS:  There were no vitals taken for this visit.  Physical Exam  GEN: Well nourished, well developed, in no acute distress HEENT: normal Neck: no JVD, carotid bruits, or masses Cardiac:RRR; no murmurs, rubs, or gallops  Respiratory:  clear to auscultation bilaterally, normal work of breathing GI: soft, nontender, nondistended, + BS Ext: without cyanosis, clubbing, or edema, Good distal pulses bilaterally MS: no deformity or atrophy Skin: warm and dry, no rash Neuro:  Alert and Oriented x 3, Strength and sensation  are intact Psych: euthymic mood, full affect  Wt Readings from Last 3 Encounters:  11/17/15 199 lb (90.3 kg)  12/04/14 200 lb 9.6 oz (91 kg)  05/15/14 202 lb (91.6 kg)      Studies/Labs Reviewed:   EKG:  EKG is*** ordered today.  The ekg ordered today demonstrates ***  Recent Labs: No results found for requested labs within last 8760 hours.   Lipid Panel    Component Value Date/Time   CHOL 125 02/23/2009   TRIG 60 02/23/2009   HDL 47 02/23/2009   CHOLHDL 4.5 06/10/2007 0430   VLDL 19 06/10/2007 0430   LDLCALC 66 02/23/2009    Additional studies/ records that were reviewed today include:   Cardiac cath 2006 SELECTIVE CORONARY ANGIOGRAPHY:  1.  The left main coronary  artery is a moderate caliber vessel which is      without significant disease.  2.  The left circumflex coronary artery is a large caliber dominant vessel      with a moderate caliber posterior descending and a posterior lateral      branch. There were several small obtuse marginal branches that have      luminal irregularities.  3.  The left anterior descending coronary artery is a moderate caliber      vessel which has an 80% stenosis in its midportion between the first and      second diagonals involving the ostium of the second diagonal.  The      remainder of the artery has luminal irregularities.  4.  The right coronary artery is a moderate caliber nondominant vessel with      a relatively moderate caliber RV branch.  5.  The left ventriculogram reveals normal LV systolic function, estimated      at 60% with no wall motion abnormalities and no mitral regurgitation.    ASSESSMENT:  1.  Single-vessel coronary disease.  2.  Normal LV systolic function.   IMPRESSION/RECOMMENDATIONS:  Successful placement of two drug-eluting stents  in the proximal left anterior descending.  Plavix should be continued for a  minimum of a year.  Aspirin will be continued indefinitely.     ASSESSMENT:    No diagnosis found.   PLAN:  In order of problems listed above:      Medication Adjustments/Labs and Tests Ordered: Current medicines are reviewed at length with the patient today.  Concerns regarding medicines are outlined above.  Medication changes, Labs and Tests ordered today are listed in the Patient Instructions below. There are no Patient Instructions on file for this visit.   Signed, Ermalinda Barrios, PA-C  07/03/2016 1:12 PM    [facility] [place], [place],   [address] Phone: [phone]; Fax: [phone]

## 2016-09-29 DIAGNOSIS — E782 Mixed hyperlipidemia: Secondary | ICD-10-CM | POA: Diagnosis not present

## 2016-09-29 DIAGNOSIS — R7301 Impaired fasting glucose: Secondary | ICD-10-CM | POA: Diagnosis not present

## 2016-10-03 DIAGNOSIS — Z6829 Body mass index (BMI) 29.0-29.9, adult: Secondary | ICD-10-CM | POA: Diagnosis not present

## 2016-10-03 DIAGNOSIS — J302 Other seasonal allergic rhinitis: Secondary | ICD-10-CM | POA: Diagnosis not present

## 2016-10-03 DIAGNOSIS — E782 Mixed hyperlipidemia: Secondary | ICD-10-CM | POA: Diagnosis not present

## 2016-10-03 DIAGNOSIS — Z8673 Personal history of transient ischemic attack (TIA), and cerebral infarction without residual deficits: Secondary | ICD-10-CM | POA: Diagnosis not present

## 2016-10-03 DIAGNOSIS — Z23 Encounter for immunization: Secondary | ICD-10-CM | POA: Diagnosis not present

## 2016-10-03 DIAGNOSIS — R7301 Impaired fasting glucose: Secondary | ICD-10-CM | POA: Diagnosis not present

## 2016-10-03 DIAGNOSIS — K219 Gastro-esophageal reflux disease without esophagitis: Secondary | ICD-10-CM | POA: Diagnosis not present

## 2016-10-03 DIAGNOSIS — I10 Essential (primary) hypertension: Secondary | ICD-10-CM | POA: Diagnosis not present

## 2016-10-03 DIAGNOSIS — D72829 Elevated white blood cell count, unspecified: Secondary | ICD-10-CM | POA: Diagnosis not present

## 2016-11-05 ENCOUNTER — Other Ambulatory Visit: Payer: Self-pay | Admitting: Cardiovascular Disease

## 2016-11-23 NOTE — Progress Notes (Signed)
Patient ID: [name], male   DOB: 1941-03-17, 76 y.o.   MRN: [id number] [name] is seen today for F/U of HTN, elevated lipids and CAD he has had previous stenting of the LAD. Stent was patent on cath in 2009 and he had a non-ischemic myovue in 11/2008. He has been doing well with no significant SSCP. His activity is limited by previous polio. He has pain in his calves with ambulation. Sometimes it occurs at rest. Arterial Duplex and ABI's last visit showed no significant PVD. He denises PND, orthopnea, palpitations, dypnea or edema. He has been compliant with his meds Staying busy doing remodeling work   Labs with DR Empire LDL 82 and normal LFTs  Asked if his heart was healthy enough for sex and I said yes but no viagra like meds  Cholesterol checked by primary and good Taking Tumeric now   ROS: Denies fever, malais, weight loss, blurry vision, decreased visual acuity, cough, sputum, SOB, hemoptysis, pleuritic pain, palpitaitons, heartburn, abdominal pain, melena, lower extremity edema, claudication, or rash.  All other systems reviewed and negative  General: Affect appropriate Healthy:  appears stated age 41: normal Neck supple with no adenopathy JVP normal no bruits no thyromegaly Lungs clear with no wheezing and good diaphragmatic motion Heart:  S1/S2 SEM  murmur, no rub, gallop or click PMI normal Abdomen: benighn, BS positve, no tenderness, no AAA no bruit.  No HSM or HJR Distal pulses intact with no bruits Plus one bilateral edema with mild stasis  Neuro non-focal Skin warm and dry Polio with atrophied right shoulder and arm  Post left TKR    Current Outpatient Prescriptions  Medication Sig Dispense Refill  . aspirin 81 MG tablet Take 81 mg by mouth daily.      . clopidogrel (PLAVIX) 75 MG tablet TAKE ONE TABLET BY MOUTH ONCE DAILY 90 tablet 2  . nitroGLYCERIN (NITROSTAT) 0.4 MG SL tablet Place 1 tablet (0.4 mg total) under the tongue every 5 (five) minutes  as needed. 25 tablet 3  . simvastatin (ZOCOR) 40 MG tablet Take 40 mg by mouth daily at 6 PM.      No current facility-administered medications for this visit.     Allergies  Patient has no known allergies.  Electrocardiogram:  12/04/13   SR RAD nonspecific ST changes  11/17/15  SR rate 63  RAD RVH   Assessment and Plan  CAD: Distant LAD stent Patent cath 2009 Normal myovue 2010  Stable with no angina and good activity level.  Continue medical Rx new nitro called in   HTN: Well controlled.  Continue current medications and low sodium Dash type diet.    Chol:   Cholesterol is at goal.  Labs with primary   Murmur: SEM AV sclerosis no need for echo as he is asymptomatic   Edema:  Dependant offered to call in low dose diuretic PRN Low sodium diet .   [name]

## 2016-11-27 ENCOUNTER — Ambulatory Visit (INDEPENDENT_AMBULATORY_CARE_PROVIDER_SITE_OTHER): Payer: Medicare HMO | Admitting: Cardiovascular Disease

## 2016-11-27 ENCOUNTER — Encounter: Payer: Self-pay | Admitting: Cardiovascular Disease

## 2016-11-27 VITALS — BP 128/84 | HR 66 | Ht 66.0 in | Wt 199.0 lb

## 2016-11-27 DIAGNOSIS — I10 Essential (primary) hypertension: Secondary | ICD-10-CM | POA: Diagnosis not present

## 2016-11-27 NOTE — Patient Instructions (Signed)

## 2016-11-30 DIAGNOSIS — K219 Gastro-esophageal reflux disease without esophagitis: Secondary | ICD-10-CM | POA: Diagnosis not present

## 2016-11-30 DIAGNOSIS — Z0000 Encounter for general adult medical examination without abnormal findings: Secondary | ICD-10-CM | POA: Diagnosis not present

## 2016-11-30 DIAGNOSIS — Z7982 Long term (current) use of aspirin: Secondary | ICD-10-CM | POA: Diagnosis not present

## 2016-11-30 DIAGNOSIS — Z6830 Body mass index (BMI) 30.0-30.9, adult: Secondary | ICD-10-CM | POA: Diagnosis not present

## 2016-11-30 DIAGNOSIS — G8333 Monoplegia, unspecified affecting right nondominant side: Secondary | ICD-10-CM | POA: Diagnosis not present

## 2016-11-30 DIAGNOSIS — E7800 Pure hypercholesterolemia, unspecified: Secondary | ICD-10-CM | POA: Diagnosis not present

## 2016-11-30 DIAGNOSIS — B91 Sequelae of poliomyelitis: Secondary | ICD-10-CM | POA: Diagnosis not present

## 2016-11-30 DIAGNOSIS — H9113 Presbycusis, bilateral: Secondary | ICD-10-CM | POA: Diagnosis not present

## 2016-11-30 DIAGNOSIS — E669 Obesity, unspecified: Secondary | ICD-10-CM | POA: Diagnosis not present

## 2016-11-30 DIAGNOSIS — I2510 Atherosclerotic heart disease of native coronary artery without angina pectoris: Secondary | ICD-10-CM | POA: Diagnosis not present

## 2017-04-02 DIAGNOSIS — I10 Essential (primary) hypertension: Secondary | ICD-10-CM | POA: Diagnosis not present

## 2017-04-02 DIAGNOSIS — R7301 Impaired fasting glucose: Secondary | ICD-10-CM | POA: Diagnosis not present

## 2017-04-04 DIAGNOSIS — M25569 Pain in unspecified knee: Secondary | ICD-10-CM | POA: Diagnosis not present

## 2017-04-04 DIAGNOSIS — D72829 Elevated white blood cell count, unspecified: Secondary | ICD-10-CM | POA: Diagnosis not present

## 2017-04-04 DIAGNOSIS — K219 Gastro-esophageal reflux disease without esophagitis: Secondary | ICD-10-CM | POA: Diagnosis not present

## 2017-04-04 DIAGNOSIS — R7301 Impaired fasting glucose: Secondary | ICD-10-CM | POA: Diagnosis not present

## 2017-04-04 DIAGNOSIS — D649 Anemia, unspecified: Secondary | ICD-10-CM | POA: Diagnosis not present

## 2017-04-04 DIAGNOSIS — I10 Essential (primary) hypertension: Secondary | ICD-10-CM | POA: Diagnosis not present

## 2017-04-04 DIAGNOSIS — Z8673 Personal history of transient ischemic attack (TIA), and cerebral infarction without residual deficits: Secondary | ICD-10-CM | POA: Diagnosis not present

## 2017-04-04 DIAGNOSIS — R109 Unspecified abdominal pain: Secondary | ICD-10-CM | POA: Diagnosis not present

## 2017-04-04 DIAGNOSIS — E782 Mixed hyperlipidemia: Secondary | ICD-10-CM | POA: Diagnosis not present

## 2017-04-04 DIAGNOSIS — J302 Other seasonal allergic rhinitis: Secondary | ICD-10-CM | POA: Diagnosis not present

## 2017-05-02 DIAGNOSIS — R7301 Impaired fasting glucose: Secondary | ICD-10-CM | POA: Diagnosis not present

## 2017-05-02 DIAGNOSIS — E782 Mixed hyperlipidemia: Secondary | ICD-10-CM | POA: Diagnosis not present

## 2017-05-02 DIAGNOSIS — I10 Essential (primary) hypertension: Secondary | ICD-10-CM | POA: Diagnosis not present

## 2017-05-14 DIAGNOSIS — D72829 Elevated white blood cell count, unspecified: Secondary | ICD-10-CM | POA: Diagnosis not present

## 2017-05-14 DIAGNOSIS — J309 Allergic rhinitis, unspecified: Secondary | ICD-10-CM | POA: Diagnosis not present

## 2017-05-14 DIAGNOSIS — K219 Gastro-esophageal reflux disease without esophagitis: Secondary | ICD-10-CM | POA: Diagnosis not present

## 2017-05-14 DIAGNOSIS — M25569 Pain in unspecified knee: Secondary | ICD-10-CM | POA: Diagnosis not present

## 2017-05-14 DIAGNOSIS — E782 Mixed hyperlipidemia: Secondary | ICD-10-CM | POA: Diagnosis not present

## 2017-05-14 DIAGNOSIS — R7301 Impaired fasting glucose: Secondary | ICD-10-CM | POA: Diagnosis not present

## 2017-05-14 DIAGNOSIS — Z8673 Personal history of transient ischemic attack (TIA), and cerebral infarction without residual deficits: Secondary | ICD-10-CM | POA: Diagnosis not present

## 2017-05-14 DIAGNOSIS — Z6829 Body mass index (BMI) 29.0-29.9, adult: Secondary | ICD-10-CM | POA: Diagnosis not present

## 2017-05-14 DIAGNOSIS — D509 Iron deficiency anemia, unspecified: Secondary | ICD-10-CM | POA: Diagnosis not present

## 2017-05-14 DIAGNOSIS — I10 Essential (primary) hypertension: Secondary | ICD-10-CM | POA: Diagnosis not present

## 2017-08-01 ENCOUNTER — Other Ambulatory Visit: Payer: Self-pay | Admitting: Cardiovascular Disease

## 2017-08-28 DIAGNOSIS — J029 Acute pharyngitis, unspecified: Secondary | ICD-10-CM | POA: Diagnosis not present

## 2017-08-28 DIAGNOSIS — J309 Allergic rhinitis, unspecified: Secondary | ICD-10-CM | POA: Diagnosis not present

## 2017-08-28 DIAGNOSIS — M25569 Pain in unspecified knee: Secondary | ICD-10-CM | POA: Diagnosis not present

## 2017-08-28 DIAGNOSIS — Z6830 Body mass index (BMI) 30.0-30.9, adult: Secondary | ICD-10-CM | POA: Diagnosis not present

## 2017-08-28 DIAGNOSIS — K219 Gastro-esophageal reflux disease without esophagitis: Secondary | ICD-10-CM | POA: Diagnosis not present

## 2017-08-28 DIAGNOSIS — E782 Mixed hyperlipidemia: Secondary | ICD-10-CM | POA: Diagnosis not present

## 2017-08-28 DIAGNOSIS — R7301 Impaired fasting glucose: Secondary | ICD-10-CM | POA: Diagnosis not present

## 2017-08-28 DIAGNOSIS — I2510 Atherosclerotic heart disease of native coronary artery without angina pectoris: Secondary | ICD-10-CM | POA: Diagnosis not present

## 2017-08-28 DIAGNOSIS — I10 Essential (primary) hypertension: Secondary | ICD-10-CM | POA: Diagnosis not present

## 2017-08-28 DIAGNOSIS — D509 Iron deficiency anemia, unspecified: Secondary | ICD-10-CM | POA: Diagnosis not present

## 2017-09-05 DIAGNOSIS — R7301 Impaired fasting glucose: Secondary | ICD-10-CM | POA: Diagnosis not present

## 2017-09-05 DIAGNOSIS — K219 Gastro-esophageal reflux disease without esophagitis: Secondary | ICD-10-CM | POA: Diagnosis not present

## 2017-09-05 DIAGNOSIS — I2510 Atherosclerotic heart disease of native coronary artery without angina pectoris: Secondary | ICD-10-CM | POA: Diagnosis not present

## 2017-09-05 DIAGNOSIS — M25569 Pain in unspecified knee: Secondary | ICD-10-CM | POA: Diagnosis not present

## 2017-09-05 DIAGNOSIS — I10 Essential (primary) hypertension: Secondary | ICD-10-CM | POA: Diagnosis not present

## 2017-09-05 DIAGNOSIS — J029 Acute pharyngitis, unspecified: Secondary | ICD-10-CM | POA: Diagnosis not present

## 2017-09-05 DIAGNOSIS — E782 Mixed hyperlipidemia: Secondary | ICD-10-CM | POA: Diagnosis not present

## 2017-09-05 DIAGNOSIS — J309 Allergic rhinitis, unspecified: Secondary | ICD-10-CM | POA: Diagnosis not present

## 2017-09-05 DIAGNOSIS — R05 Cough: Secondary | ICD-10-CM | POA: Diagnosis not present

## 2017-09-05 DIAGNOSIS — D509 Iron deficiency anemia, unspecified: Secondary | ICD-10-CM | POA: Diagnosis not present

## 2017-09-11 DIAGNOSIS — J309 Allergic rhinitis, unspecified: Secondary | ICD-10-CM | POA: Diagnosis not present

## 2017-09-11 DIAGNOSIS — E782 Mixed hyperlipidemia: Secondary | ICD-10-CM | POA: Diagnosis not present

## 2017-09-11 DIAGNOSIS — D509 Iron deficiency anemia, unspecified: Secondary | ICD-10-CM | POA: Diagnosis not present

## 2017-09-11 DIAGNOSIS — Z6829 Body mass index (BMI) 29.0-29.9, adult: Secondary | ICD-10-CM | POA: Diagnosis not present

## 2017-09-11 DIAGNOSIS — R7301 Impaired fasting glucose: Secondary | ICD-10-CM | POA: Diagnosis not present

## 2017-09-11 DIAGNOSIS — M25569 Pain in unspecified knee: Secondary | ICD-10-CM | POA: Diagnosis not present

## 2017-09-11 DIAGNOSIS — J029 Acute pharyngitis, unspecified: Secondary | ICD-10-CM | POA: Diagnosis not present

## 2017-09-11 DIAGNOSIS — K219 Gastro-esophageal reflux disease without esophagitis: Secondary | ICD-10-CM | POA: Diagnosis not present

## 2017-09-11 DIAGNOSIS — I10 Essential (primary) hypertension: Secondary | ICD-10-CM | POA: Diagnosis not present

## 2017-09-11 DIAGNOSIS — I2510 Atherosclerotic heart disease of native coronary artery without angina pectoris: Secondary | ICD-10-CM | POA: Diagnosis not present

## 2017-09-14 DIAGNOSIS — E782 Mixed hyperlipidemia: Secondary | ICD-10-CM | POA: Diagnosis not present

## 2017-09-14 DIAGNOSIS — K219 Gastro-esophageal reflux disease without esophagitis: Secondary | ICD-10-CM | POA: Diagnosis not present

## 2017-09-14 DIAGNOSIS — D509 Iron deficiency anemia, unspecified: Secondary | ICD-10-CM | POA: Diagnosis not present

## 2017-09-14 DIAGNOSIS — R7301 Impaired fasting glucose: Secondary | ICD-10-CM | POA: Diagnosis not present

## 2017-09-14 DIAGNOSIS — I10 Essential (primary) hypertension: Secondary | ICD-10-CM | POA: Diagnosis not present

## 2017-09-14 DIAGNOSIS — J302 Other seasonal allergic rhinitis: Secondary | ICD-10-CM | POA: Diagnosis not present

## 2017-09-14 DIAGNOSIS — Z8673 Personal history of transient ischemic attack (TIA), and cerebral infarction without residual deficits: Secondary | ICD-10-CM | POA: Diagnosis not present

## 2017-09-14 DIAGNOSIS — M25569 Pain in unspecified knee: Secondary | ICD-10-CM | POA: Diagnosis not present

## 2017-10-09 ENCOUNTER — Inpatient Hospital Stay (HOSPITAL_COMMUNITY): Payer: Medicare HMO

## 2017-10-09 ENCOUNTER — Other Ambulatory Visit: Payer: Self-pay

## 2017-10-09 ENCOUNTER — Encounter (HOSPITAL_COMMUNITY): Payer: Self-pay | Admitting: Hematology

## 2017-10-09 ENCOUNTER — Inpatient Hospital Stay (HOSPITAL_COMMUNITY): Payer: Medicare HMO | Attending: Hematology | Admitting: Hematology

## 2017-10-09 VITALS — BP 143/78 | HR 55 | Temp 97.9°F | Resp 18 | Wt 205.0 lb

## 2017-10-09 DIAGNOSIS — I10 Essential (primary) hypertension: Secondary | ICD-10-CM

## 2017-10-09 DIAGNOSIS — D721 Eosinophilia: Secondary | ICD-10-CM | POA: Diagnosis not present

## 2017-10-09 DIAGNOSIS — D72829 Elevated white blood cell count, unspecified: Secondary | ICD-10-CM

## 2017-10-09 LAB — CBC WITH DIFFERENTIAL/PLATELET
Basophils Absolute: 0.1 10*3/uL (ref 0.0–0.1)
Basophils Relative: 1 %
EOS ABS: 1.4 10*3/uL — AB (ref 0.0–0.7)
Eosinophils Relative: 14 %
HCT: 41 % (ref 39.0–52.0)
HEMOGLOBIN: 12.2 g/dL — AB (ref 13.0–17.0)
Lymphocytes Relative: 32 %
Lymphs Abs: 3.4 10*3/uL (ref 0.7–4.0)
MCH: 26.4 pg (ref 26.0–34.0)
MCHC: 29.8 g/dL — AB (ref 30.0–36.0)
MCV: 88.7 fL (ref 78.0–100.0)
MONOS PCT: 7 %
Monocytes Absolute: 0.7 10*3/uL (ref 0.1–1.0)
NEUTROS PCT: 46 %
Neutro Abs: 4.9 10*3/uL (ref 1.7–7.7)
Platelets: 258 10*3/uL (ref 150–400)
RBC: 4.62 MIL/uL (ref 4.22–5.81)
RDW: 14.9 % (ref 11.5–15.5)
WBC: 10.6 10*3/uL — AB (ref 4.0–10.5)

## 2017-10-09 LAB — LACTATE DEHYDROGENASE: LDH: 164 U/L (ref 98–192)

## 2017-10-09 NOTE — Progress Notes (Signed)
[facility] CONSULT NOTE  Patient Care Team: Celene Squibb, MD as PCP - General (Internal Medicine) Josue Hector, MD as Attending Physician (Cardiology)  CHIEF COMPLAINTS/PURPOSE OF CONSULTATION:  Leukocytosis.  HISTORY OF PRESENTING ILLNESS:  [name] 77 y.o. male is seen in consultation today for further work-up and management of leukocytosis.  His most recent CBC on 09/11/2017 reviewed by me shows white count of 12.8.  Hemoglobin and platelet count was within normal limits.  Differential showed increase in absolute neutrophil count, absolute lymphocyte count and eosinophilia.  Absolute eosinophil count was 1.3.  CBC from 05/02/2017 showed white count of 11.4 with absolute lymphocyte count of 3600 and eosinophil count of 1.2.  He does not report any skin rashes.  No history of parasitic infections.  No history of travel outside the country.  No history of respiratory symptoms suggesting eosinophilic disorders.  No GI symptoms suggesting diarrhea.  He is not on chronic steroids.  His right upper extremity was affected by polio.   MEDICAL HISTORY:  Past Medical History:  Diagnosis Date  . Coronary artery disease   . Hyperlipidemia   . Hypertension   . Polio    hx of. right arm atrophy    SURGICAL HISTORY: Past Surgical History:  Procedure Laterality Date  . CARDIAC CATHETERIZATION  07-09-2007  . CHOLECYSTECTOMY    . CORONARY STENT PLACEMENT     x3 vessels bifurcated single vessel  . KNEE ARTHROPLASTY     pinning surgery    SOCIAL HISTORY: Social History   Socioeconomic History  . Marital status: Married    Spouse name: Not on file  . Number of children: 2  . Years of education: Not on file  . Highest education level: Not on file  Occupational History  . Occupation: retired Aeronautical engineer  . Financial resource strain: Not on file  . Food insecurity:    Worry: Not on file    Inability: Not on file  . Transportation needs:    Medical:  Not on file    Non-medical: Not on file  Tobacco Use  . Smoking status: Former Smoker    Types: Cigarettes    Last attempt to quit: 06/12/2004    Years since quitting: 13.3  . Smokeless tobacco: Never Used  Substance and Sexual Activity  . Alcohol use: No    Alcohol/week: 0.0 oz  . Drug use: No  . Sexual activity: Not on file  Lifestyle  . Physical activity:    Days per week: Not on file    Minutes per session: Not on file  . Stress: Not on file  Relationships  . Social connections:    Talks on phone: Not on file    Gets together: Not on file    Attends religious service: Not on file    Active member of club or organization: Not on file    Attends meetings of clubs or organizations: Not on file    Relationship status: Not on file  . Intimate partner violence:    Fear of current or ex partner: Not on file    Emotionally abused: Not on file    Physically abused: Not on file    Forced sexual activity: Not on file  Other Topics Concern  . Not on file  Social History Narrative  . Not on file    FAMILY HISTORY: Family History  Problem Relation Age of Onset  . Heart attack Brother 47  died  . Hypertension Mother   . Heart attack Father     ALLERGIES:  has No Known Allergies.  MEDICATIONS:  Current Outpatient Medications  Medication Sig Dispense Refill  . aspirin 81 MG tablet Take 81 mg by mouth daily.      . clopidogrel (PLAVIX) 75 MG tablet TAKE 1 TABLET BY MOUTH ONCE DAILY 90 tablet 2  . nitroGLYCERIN (NITROSTAT) 0.4 MG SL tablet Place 1 tablet (0.4 mg total) under the tongue every 5 (five) minutes as needed. 25 tablet 3  . simvastatin (ZOCOR) 40 MG tablet Take 40 mg by mouth daily at 6 PM.      No current facility-administered medications for this visit.     REVIEW OF SYSTEMS:   Constitutional: Denies fevers, chills or abnormal night sweats Eyes: Denies blurriness of vision, double vision or watery eyes Ears, nose, mouth, throat, and face: Denies mucositis  or sore throat Respiratory: Denies cough, dyspnea or wheezes Cardiovascular: Denies palpitation, chest discomfort or lower extremity swelling Gastrointestinal:  Denies nausea, heartburn or change in bowel habits Skin: Denies abnormal skin rashes Lymphatics: Denies new lymphadenopathy or easy bruising Neurological:Denies numbness, tingling or new weaknesses Behavioral/Psych: Mood is stable, no new changes  All other systems were reviewed with the patient and are negative.  PHYSICAL EXAMINATION: ECOG PERFORMANCE STATUS: 0 - Asymptomatic  Vitals:   10/09/17 1152  BP: (!) 143/78  Pulse: (!) 55  Resp: 18  Temp: 97.9 F (36.6 C)  SpO2: 97%   Filed Weights   10/09/17 1152  Weight: 205 lb (93 kg)    GENERAL:alert, no distress and comfortable SKIN: skin color, texture, turgor are normal, no rashes or significant lesions EYES: normal, conjunctiva are pink and non-injected, sclera clear OROPHARYNX:no exudate, no erythema and lips, buccal mucosa, and tongue normal  NECK: supple, thyroid normal size, non-tender, without nodularity LYMPH:  no palpable lymphadenopathy in the cervical, axillary or inguinal LUNGS: clear to auscultation and percussion with normal breathing effort HEART: regular rate & rhythm and no murmurs and no lower extremity edema ABDOMEN:abdomen soft, non-tender and normal bowel sounds Musculoskeletal:no cyanosis of digits and no clubbing.  Right upper extremity was affected by polio.  He has kyphosis. PSYCH: alert & oriented x 3 with fluent speech NEURO: no focal motor/sensory deficits  LABORATORY DATA:  I have reviewed the data as listed Lab Results  Component Value Date   WBC 12.8 (H) 06/09/2007   HGB 14.7 06/09/2007   HCT 44.3 06/09/2007   MCV 91.9 06/09/2007   PLT 263 06/09/2007     Chemistry      Component Value Date/Time   NA 142 02/23/2009   K 4.8 02/23/2009   CL 104 02/23/2009   CO2 23 02/23/2009   BUN 18 02/23/2009   CREATININE 1.00 01/28/2014  1049      Component Value Date/Time   CALCIUM 10.0 02/23/2009   ALKPHOS 48 02/23/2009   AST 26 02/23/2009   ALT 27 02/23/2009        ASSESSMENT & PLAN:  Leukocytosis 1.  Leukocytosis with eosinophilia: - Found to have mild leukocytosis for at least last 10 years.  Absolute eosinophil count in 2008 was 900.  Latest absolute eosinophil count was ranging between 1200-1300.  He does not have any symptoms including fevers, night sweats or weight loss.  No history of infectious diseases.  No skin rashes.  No gastrointestinal symptoms like diarrhea and abdominal pain.  No relevant respiratory symptoms.  I would review his peripheral smear.  We will check an LDH level.  I would like to rule out malignant causes of eosinophilia like CML, myeloproliferative disorder, other leukemias and lymphomas.  He does not have any peripheral adenopathy or splenomegaly.  We will send a BCR/ABL by FISH, Jak 2, and flow cytometry for leukemia lymphoma monitoring.  I will see him back in 3 to 4 weeks to discuss the results.  Orders Placed This Encounter  Procedures  . CBC with Differential    Standing Status:   Future    Number of Occurrences:   1    Standing Expiration Date:   10/09/2018  . Pathologist smear review    Standing Status:   Future    Standing Expiration Date:   10/09/2018  . Lactate dehydrogenase    Standing Status:   Future    Number of Occurrences:   1    Standing Expiration Date:   10/09/2018  . JAK2 genotypr    Standing Status:   Future    Number of Occurrences:   1    Standing Expiration Date:   10/09/2018    All questions were answered. The patient knows to call the clinic with any problems, questions or concerns.      Derek Jack, MD 10/09/2017 12:41 PM

## 2017-10-09 NOTE — Patient Instructions (Signed)
[facility] at [facility] Discharge Instructions  Today you saw Dr. K.   Thank you for choosing [facility] at [facility] to provide your oncology and hematology care.  To afford each patient quality time with our provider, please arrive at least 15 minutes before your scheduled appointment time.   If you have a lab appointment with the Cancer Center please come in thru the  Main Entrance and check in at the main information desk  You need to re-schedule your appointment should you arrive 10 or more minutes late.  We strive to give you quality time with our providers, and arriving late affects you and other patients whose appointments are after yours.  Also, if you no show three or more times for appointments you may be dismissed from the clinic at the providers discretion.     Again, thank you for choosing [facility].  Our hope is that these requests will decrease the amount of time that you wait before being seen by our physicians.       _____________________________________________________________  Should you have questions after your visit to [facility], please contact our office at [phone] between the hours of 8:30 a.m. and 4:30 p.m.  Voicemails left after 4:30 p.m. will not be returned until the following business day.  For prescription refill requests, have your pharmacy contact our office.       Resources For Cancer Patients and their Caregivers ? American Cancer Society: Can assist with transportation, wigs, general needs, runs Look Good Feel Better.        1-888-227-6333 ? Cancer Care: Provides financial assistance, online support groups, medication/co-pay assistance.  1-800-813-HOPE (4673) ? [facility] Assists [place] cancer patients and their families through emotional , educational and financial support.  336-427-4357 ? [place] DSS Where to apply for food  stamps, Medicaid and utility assistance. 336-342-1394 ? RCATS: Transportation to medical appointments. 336-347-2287 ? Social Security Administration: May apply for disability if have a Stage IV cancer. 336-342-7796 1-800-772-1213 ? [place] Aging, Disability and Transit Services: Assists with nutrition, care and transit needs. 336-349-2343  Cancer Center Support Programs:   > Cancer Support Group  2nd Tuesday of the month 1pm-2pm, Journey Room   > Creative Journey  3rd Tuesday of the month 1130am-1pm, Journey Room    

## 2017-10-09 NOTE — Assessment & Plan Note (Signed)
1.  Leukocytosis with eosinophilia: - Found to have mild leukocytosis for at least last 10 years.  Absolute eosinophil count in 2008 was 900.  Latest absolute eosinophil count was ranging between 1200-1300.  He does not have any symptoms including fevers, night sweats or weight loss.  No history of infectious diseases.  No skin rashes.  No gastrointestinal symptoms like diarrhea and abdominal pain.  No relevant respiratory symptoms.  I would review his peripheral smear.  We will check an LDH level.  I would like to rule out malignant causes of eosinophilia like CML, myeloproliferative disorder, other leukemias and lymphomas.  He does not have any peripheral adenopathy or splenomegaly.  We will send a BCR/ABL by FISH, Jak 2, and flow cytometry for leukemia lymphoma monitoring.  I will see him back in 3 to 4 weeks to discuss the results.

## 2017-10-10 DIAGNOSIS — J309 Allergic rhinitis, unspecified: Secondary | ICD-10-CM | POA: Diagnosis not present

## 2017-10-10 DIAGNOSIS — E1151 Type 2 diabetes mellitus with diabetic peripheral angiopathy without gangrene: Secondary | ICD-10-CM | POA: Diagnosis not present

## 2017-10-10 DIAGNOSIS — K08409 Partial loss of teeth, unspecified cause, unspecified class: Secondary | ICD-10-CM | POA: Diagnosis not present

## 2017-10-10 DIAGNOSIS — E669 Obesity, unspecified: Secondary | ICD-10-CM | POA: Diagnosis not present

## 2017-10-10 DIAGNOSIS — Z6832 Body mass index (BMI) 32.0-32.9, adult: Secondary | ICD-10-CM | POA: Diagnosis not present

## 2017-10-10 DIAGNOSIS — B91 Sequelae of poliomyelitis: Secondary | ICD-10-CM | POA: Diagnosis not present

## 2017-10-10 DIAGNOSIS — M8960 Osteopathy after poliomyelitis, unspecified site: Secondary | ICD-10-CM | POA: Diagnosis not present

## 2017-10-10 DIAGNOSIS — E785 Hyperlipidemia, unspecified: Secondary | ICD-10-CM | POA: Diagnosis not present

## 2017-10-10 DIAGNOSIS — I2510 Atherosclerotic heart disease of native coronary artery without angina pectoris: Secondary | ICD-10-CM | POA: Diagnosis not present

## 2017-10-10 DIAGNOSIS — K219 Gastro-esophageal reflux disease without esophagitis: Secondary | ICD-10-CM | POA: Diagnosis not present

## 2017-10-15 LAB — JAK2 GENOTYPR

## 2017-10-15 LAB — TISSUE HYBRIDIZATION TO NCBH

## 2017-10-16 ENCOUNTER — Encounter (HOSPITAL_COMMUNITY): Payer: Self-pay | Admitting: Hematology

## 2017-10-29 DIAGNOSIS — Z6830 Body mass index (BMI) 30.0-30.9, adult: Secondary | ICD-10-CM | POA: Diagnosis not present

## 2017-10-29 DIAGNOSIS — R7301 Impaired fasting glucose: Secondary | ICD-10-CM | POA: Diagnosis not present

## 2017-10-29 DIAGNOSIS — D509 Iron deficiency anemia, unspecified: Secondary | ICD-10-CM | POA: Diagnosis not present

## 2017-10-29 DIAGNOSIS — R112 Nausea with vomiting, unspecified: Secondary | ICD-10-CM | POA: Diagnosis not present

## 2017-10-29 DIAGNOSIS — J309 Allergic rhinitis, unspecified: Secondary | ICD-10-CM | POA: Diagnosis not present

## 2017-10-29 DIAGNOSIS — R197 Diarrhea, unspecified: Secondary | ICD-10-CM | POA: Diagnosis not present

## 2017-10-29 DIAGNOSIS — I2510 Atherosclerotic heart disease of native coronary artery without angina pectoris: Secondary | ICD-10-CM | POA: Diagnosis not present

## 2017-10-29 DIAGNOSIS — E782 Mixed hyperlipidemia: Secondary | ICD-10-CM | POA: Diagnosis not present

## 2017-10-29 DIAGNOSIS — I10 Essential (primary) hypertension: Secondary | ICD-10-CM | POA: Diagnosis not present

## 2017-10-29 DIAGNOSIS — K219 Gastro-esophageal reflux disease without esophagitis: Secondary | ICD-10-CM | POA: Diagnosis not present

## 2017-11-07 ENCOUNTER — Encounter (HOSPITAL_COMMUNITY): Payer: Self-pay | Admitting: Hematology

## 2017-11-07 ENCOUNTER — Other Ambulatory Visit: Payer: Self-pay

## 2017-11-07 ENCOUNTER — Inpatient Hospital Stay (HOSPITAL_COMMUNITY): Payer: Medicare HMO | Attending: Hematology | Admitting: Hematology

## 2017-11-07 VITALS — BP 137/66 | HR 67 | Temp 98.1°F | Resp 18 | Wt 203.2 lb

## 2017-11-07 DIAGNOSIS — D721 Eosinophilia: Secondary | ICD-10-CM | POA: Diagnosis not present

## 2017-11-07 DIAGNOSIS — D72829 Elevated white blood cell count, unspecified: Secondary | ICD-10-CM | POA: Insufficient documentation

## 2017-11-07 NOTE — Progress Notes (Signed)
Pt comes into Cancer center for follow-up visit with Dr. Raliegh Ip. Left knee has large skin abrasion and bloody. Pt states he fell in the parking lot. Wife is present. Stated 2 people from parking area assisted pt up. One obtained a wheelchair and helped pt inside hospital.  Pt denies pain, states " it just burns" refused going to ER. States he would like it wrapped.  Used abd to cover and wrap.

## 2017-11-07 NOTE — Assessment & Plan Note (Signed)
1.  Leukocytosis with eosinophilia: - Found to have mild leukocytosis for at least last 10 years.  Absolute eosinophil count in 2008 was 900.  Latest absolute eosinophil count was ranging between 1200-1300.  His CBC on 10/09/2017 shows white count of 10.6, hemoglobin of 12.2, platelet count 258, absolute eosinophil count of 1400.  He does not have any B symptoms.  No history of travel outside the country to parasite endemic areas.  No gastrointestinal symptoms like diarrhea or abdominal pain.  No relevant sinopulmonary symptoms.  He has 1 pet cat.  Denies any over-the-counter herbal or other medications.  His only medications include aspirin, Plavix, Zocor and Nitrostat as needed.  Does not have any peripheral adenopathy or splenomegaly. - His BCR/ABL by FISH was negative.  Jak 2 V617F test was negative.  Flow cytometry shows a predominance of T lymphocytes with relative abundance of CD 8+ cells.  No monoclonal B-cell population was identified.  This could be a reactive process.  LDH was within normal limits. - He has asymptomatic/incidental eosinophilia with absolute eosinophil count less than 1500.  He does not have any clinical signs or symptoms of disorders that can cause eosinophilia.  Hence we can safely delay further work-up with a bone marrow biopsy.  I will see him back in 3 months with a repeat CBC with differential, LDH level.  We will also send a B12, serum tryptase level and quantitative immunoglobulins.  Patient and wife agreeable to this plan.

## 2017-11-07 NOTE — Patient Instructions (Signed)
[facility] at [facility] Discharge Instructions  Today you saw Dr. K.   Thank you for choosing [facility] at [facility] to provide your oncology and hematology care.  To afford each patient quality time with our provider, please arrive at least 15 minutes before your scheduled appointment time.   If you have a lab appointment with the Cancer Center please come in thru the  Main Entrance and check in at the main information desk  You need to re-schedule your appointment should you arrive 10 or more minutes late.  We strive to give you quality time with our providers, and arriving late affects you and other patients whose appointments are after yours.  Also, if you no show three or more times for appointments you may be dismissed from the clinic at the providers discretion.     Again, thank you for choosing [facility].  Our hope is that these requests will decrease the amount of time that you wait before being seen by our physicians.       _____________________________________________________________  Should you have questions after your visit to [facility], please contact our office at [phone] between the hours of 8:30 a.m. and 4:30 p.m.  Voicemails left after 4:30 p.m. will not be returned until the following business day.  For prescription refill requests, have your pharmacy contact our office.       Resources For Cancer Patients and their Caregivers ? American Cancer Society: Can assist with transportation, wigs, general needs, runs Look Good Feel Better.        1-888-227-6333 ? Cancer Care: Provides financial assistance, online support groups, medication/co-pay assistance.  1-800-813-HOPE (4673) ? [facility] Assists [place] cancer patients and their families through emotional , educational and financial support.  336-427-4357 ? [place] DSS Where to apply for food  stamps, Medicaid and utility assistance. 336-342-1394 ? RCATS: Transportation to medical appointments. 336-347-2287 ? Social Security Administration: May apply for disability if have a Stage IV cancer. 336-342-7796 1-800-772-1213 ? [place] Aging, Disability and Transit Services: Assists with nutrition, care and transit needs. 336-349-2343  Cancer Center Support Programs:   > Cancer Support Group  2nd Tuesday of the month 1pm-2pm, Journey Room   > Creative Journey  3rd Tuesday of the month 1130am-1pm, Journey Room    

## 2017-11-07 NOTE — Progress Notes (Signed)
[facility] [address] [place] [address]   CLINIC:  Medical Oncology/Hematology  PCP:  Celene Squibb, MD [place] [address] [phone]   REASON FOR VISIT:  Follow-up for leukocytosis and eosinophilia.   INTERVAL HISTORY:  Mr. Nolt 77 y.o. male returns for follow-up visit of leukocytosis.  He denies any fevers, night sweats or weight loss.  He reportedly fell and had some abrasion on the left knee.  Denies any skin rashes.  Denies any arthralgias with swelling.  No history of connective tissue disorders.  No history of over-the-counter medications.  He is very active physically.  No travel history outside the [place].  Denies any history of parasitic infections.  Denies any diarrhea, abdominal pain or anorexia.  No cough, dyspnea or wheezing or chronic nasal complaints were reported.  No new medications were started recently.   REVIEW OF SYSTEMS:  Review of Systems  Constitutional: Negative.   HENT:  Negative.   Respiratory: Negative.   Cardiovascular: Negative.   Gastrointestinal: Negative.   Genitourinary: Negative.    Musculoskeletal: Negative.   Skin: Negative.   Neurological: Negative.   Psychiatric/Behavioral: Negative.      PAST MEDICAL/SURGICAL HISTORY:  Past Medical History:  Diagnosis Date  . Coronary artery disease   . Hyperlipidemia   . Hypertension   . Polio    hx of. right arm atrophy   Past Surgical History:  Procedure Laterality Date  . CARDIAC CATHETERIZATION  07-09-2007  . CHOLECYSTECTOMY    . CORONARY STENT PLACEMENT     x3 vessels bifurcated single vessel  . KNEE ARTHROPLASTY     pinning surgery     SOCIAL HISTORY:  Social History   Socioeconomic History  . Marital status: Married    Spouse name: Not on file  . Number of children: 2  . Years of education: Not on file  . Highest education level: Not on file  Occupational History  . Occupation: retired Aeronautical engineer  . Financial  resource strain: Not on file  . Food insecurity:    Worry: Not on file    Inability: Not on file  . Transportation needs:    Medical: Not on file    Non-medical: Not on file  Tobacco Use  . Smoking status: Former Smoker    Types: Cigarettes    Last attempt to quit: 06/12/2004    Years since quitting: 13.4  . Smokeless tobacco: Never Used  Substance and Sexual Activity  . Alcohol use: No    Alcohol/week: 0.0 oz  . Drug use: No  . Sexual activity: Not on file  Lifestyle  . Physical activity:    Days per week: Not on file    Minutes per session: Not on file  . Stress: Not on file  Relationships  . Social connections:    Talks on phone: Not on file    Gets together: Not on file    Attends religious service: Not on file    Active member of club or organization: Not on file    Attends meetings of clubs or organizations: Not on file    Relationship status: Not on file  . Intimate partner violence:    Fear of current or ex partner: Not on file    Emotionally abused: Not on file    Physically abused: Not on file    Forced sexual activity: Not on file  Other Topics Concern  . Not on file  Social History Narrative  . Not on file    FAMILY HISTORY:  Family History  Problem Relation Age of Onset  . Heart attack Brother 9       died  . Hypertension Mother   . Heart attack Father     CURRENT MEDICATIONS:  Outpatient Encounter Medications as of 11/07/2017  Medication Sig  . aspirin 81 MG tablet Take 81 mg by mouth daily.    . clopidogrel (PLAVIX) 75 MG tablet TAKE 1 TABLET BY MOUTH ONCE DAILY  . nitroGLYCERIN (NITROSTAT) 0.4 MG SL tablet Place 1 tablet (0.4 mg total) under the tongue every 5 (five) minutes as needed.  . simvastatin (ZOCOR) 40 MG tablet Take 40 mg by mouth daily at 6 PM.    No facility-administered encounter medications on file as of 11/07/2017.     ALLERGIES:  No Known Allergies   PHYSICAL EXAM:  ECOG Performance status: 1  Vitals:   11/07/17 1043    BP: 137/66  Pulse: 67  Resp: 18  Temp: 98.1 F (36.7 C)  SpO2: 97%   Filed Weights   11/07/17 1043  Weight: 203 lb 3.2 oz (92.2 kg)    Physical Exam No lymphadenopathy palpable in the neck, axilla and inguinal region. No palpable hepatospleno megaly. No skin rashes.  LABORATORY DATA:  I have reviewed the labs as listed.  CBC    Component Value Date/Time   WBC 10.6 (H) 10/09/2017 1244   RBC 4.62 10/09/2017 1244   HGB 12.2 (L) 10/09/2017 1244   HCT 41.0 10/09/2017 1244   PLT 258 10/09/2017 1244   MCV 88.7 10/09/2017 1244   MCH 26.4 10/09/2017 1244   MCHC 29.8 (L) 10/09/2017 1244   RDW 14.9 10/09/2017 1244   LYMPHSABS 3.4 10/09/2017 1244   MONOABS 0.7 10/09/2017 1244   EOSABS 1.4 (H) 10/09/2017 1244   BASOSABS 0.1 10/09/2017 1244   CMP Latest Ref Rng & Units 01/28/2014 02/23/2009 06/09/2007  Glucose mg/dL - 99 107(H)  BUN mg/dL - 18 17  Creatinine 0.50 - 1.35 mg/dL 1.00 0.82 0.72  Sodium meq/L - 142 138  Potassium meq/L - 4.8 3.7  Chloride meq/L - 104 107  CO2 meq/L - 23 25  Calcium mg/dL - 10.0 9.1  Total Protein g/dL - 7.8 -  Alkaline Phos units/L - 48 -  AST units/L - 26 -  ALT units/L - 27 -       ASSESSMENT & PLAN:   Leukocytosis 1.  Leukocytosis with eosinophilia: - Found to have mild leukocytosis for at least last 10 years.  Absolute eosinophil count in 2008 was 900.  Latest absolute eosinophil count was ranging between 1200-1300.  His CBC on 10/09/2017 shows white count of 10.6, hemoglobin of 12.2, platelet count 258, absolute eosinophil count of 1400.  He does not have any B symptoms.  No history of travel outside the country to parasite endemic areas.  No gastrointestinal symptoms like diarrhea or abdominal pain.  No relevant sinopulmonary symptoms.  He has 1 pet cat.  Denies any over-the-counter herbal or other medications.  His only medications include aspirin, Plavix, Zocor and Nitrostat as needed.  Does not have any peripheral adenopathy or  splenomegaly. - His BCR/ABL by FISH was negative.  Jak 2 V617F test was negative.  Flow cytometry shows a predominance of T lymphocytes with relative abundance of CD 8+ cells.  No monoclonal B-cell population was identified.  This could be a reactive process.  LDH was within normal limits. - He  has asymptomatic/incidental eosinophilia with absolute eosinophil count less than 1500.  He does not have any clinical signs or symptoms of disorders that can cause eosinophilia.  Hence we can safely delay further work-up with a bone marrow biopsy.  I will see him back in 3 months with a repeat CBC with differential, LDH level.  We will also send a B12, serum tryptase level and quantitative immunoglobulins.  Patient and wife agreeable to this plan.         Orders placed this encounter:  Orders Placed This Encounter  Procedures  . CBC with Differential  . Lactate dehydrogenase  . Vitamin B12  . Tryptase  . Immunoglobulins, QN, A/E/G/M      Derek Jack, MD [place] [phone]

## 2017-11-26 NOTE — Progress Notes (Signed)
Patient ID: [name], male   DOB: 1941-03-20, 77 y.o.   MRN: [id number]   76 y.o. CAD stent LAD patent by cath 2009 and normal myovue 2010. Activity limited by polio affecting legs and calf pain. No significant PVD by ABI's HTN and HLD well Rx. No angina or chest pain   Staying busy doing remodeling work   Labs with DR Nevada Crane Reviewed LDL 82 and normal LFTs  Chronically elevated WBC Seen by hematology and no evidence of lymphoma or leukemia  ROS: Denies fever, malais, weight loss, blurry vision, decreased visual acuity, cough, sputum, SOB, hemoptysis, pleuritic pain, palpitaitons, heartburn, abdominal pain, melena, lower extremity edema, claudication, or rash.  All other systems reviewed and negative  General: BP 128/72   Pulse 63   Ht 5\' 6"  (1.676 m)   Wt 201 lb (91.2 kg)   SpO2 95%   BMI 32.44 kg/m   Affect appropriate Healthy:  appears stated age 39: normal Neck supple with no adenopathy JVP normal no bruits no thyromegaly Lungs clear with no wheezing and good diaphragmatic motion Heart:  S1/S2 no murmur, no rub, gallop or click PMI normal Abdomen: benighn, BS positve, no tenderness, no AAA no bruit.  No HSM or HJR Distal pulses intact with no bruits No edema Neuro non-focal Skin warm and dry Polio with atrophied right shoulder and arm  Post left TKR    Current Outpatient Medications  Medication Sig Dispense Refill  . aspirin 81 MG tablet Take 81 mg by mouth daily.      . clopidogrel (PLAVIX) 75 MG tablet TAKE 1 TABLET BY MOUTH ONCE DAILY 90 tablet 2  . nitroGLYCERIN (NITROSTAT) 0.4 MG SL tablet Place 1 tablet (0.4 mg total) under the tongue every 5 (five) minutes as needed. 25 tablet 3  . simvastatin (ZOCOR) 40 MG tablet Take 40 mg by mouth daily at 6 PM.      No current facility-administered medications for this visit.     Allergies  Patient has no known allergies.  Electrocardiogram:  12/04/13   SR RAD nonspecific ST changes  11/17/15  SR rate 63  RAD  RVH  12/27/17 SR rate 68 RBBB RAD no changes   Assessment and Plan  CAD: Distant LAD stent Patent cath 2009 Normal myovue 2010  Stable with no angina and good activity level.  Continue medical Rx new nitro called in   HTN: Well controlled.  Continue current medications and low sodium Dash type diet.    Chol:   Cholesterol is at goal.  Labs with primary   Murmur: SEM AV sclerosis no need for echo as he is asymptomatic   Edema:  Dependant -Low dose diuretic PRN Low sodium diet .   [name]

## 2017-11-27 ENCOUNTER — Ambulatory Visit: Payer: Medicare HMO | Admitting: Cardiovascular Disease

## 2017-11-27 ENCOUNTER — Encounter: Payer: Self-pay | Admitting: Cardiovascular Disease

## 2017-11-27 VITALS — BP 128/72 | HR 63 | Ht 66.0 in | Wt 201.0 lb

## 2017-11-27 DIAGNOSIS — I10 Essential (primary) hypertension: Secondary | ICD-10-CM

## 2017-11-27 NOTE — Patient Instructions (Signed)

## 2018-01-14 DIAGNOSIS — M25569 Pain in unspecified knee: Secondary | ICD-10-CM | POA: Diagnosis not present

## 2018-01-14 DIAGNOSIS — I2510 Atherosclerotic heart disease of native coronary artery without angina pectoris: Secondary | ICD-10-CM | POA: Diagnosis not present

## 2018-01-14 DIAGNOSIS — E782 Mixed hyperlipidemia: Secondary | ICD-10-CM | POA: Diagnosis not present

## 2018-01-14 DIAGNOSIS — R7301 Impaired fasting glucose: Secondary | ICD-10-CM | POA: Diagnosis not present

## 2018-01-14 DIAGNOSIS — I10 Essential (primary) hypertension: Secondary | ICD-10-CM | POA: Diagnosis not present

## 2018-01-14 DIAGNOSIS — D509 Iron deficiency anemia, unspecified: Secondary | ICD-10-CM | POA: Diagnosis not present

## 2018-01-14 DIAGNOSIS — J309 Allergic rhinitis, unspecified: Secondary | ICD-10-CM | POA: Diagnosis not present

## 2018-01-14 DIAGNOSIS — J029 Acute pharyngitis, unspecified: Secondary | ICD-10-CM | POA: Diagnosis not present

## 2018-01-14 DIAGNOSIS — Z6829 Body mass index (BMI) 29.0-29.9, adult: Secondary | ICD-10-CM | POA: Diagnosis not present

## 2018-01-14 DIAGNOSIS — K219 Gastro-esophageal reflux disease without esophagitis: Secondary | ICD-10-CM | POA: Diagnosis not present

## 2018-01-16 DIAGNOSIS — L219 Seborrheic dermatitis, unspecified: Secondary | ICD-10-CM | POA: Diagnosis not present

## 2018-01-16 DIAGNOSIS — I10 Essential (primary) hypertension: Secondary | ICD-10-CM | POA: Diagnosis not present

## 2018-01-16 DIAGNOSIS — E1169 Type 2 diabetes mellitus with other specified complication: Secondary | ICD-10-CM | POA: Diagnosis not present

## 2018-01-16 DIAGNOSIS — J302 Other seasonal allergic rhinitis: Secondary | ICD-10-CM | POA: Diagnosis not present

## 2018-01-16 DIAGNOSIS — D509 Iron deficiency anemia, unspecified: Secondary | ICD-10-CM | POA: Diagnosis not present

## 2018-01-16 DIAGNOSIS — D72829 Elevated white blood cell count, unspecified: Secondary | ICD-10-CM | POA: Diagnosis not present

## 2018-01-16 DIAGNOSIS — Z6830 Body mass index (BMI) 30.0-30.9, adult: Secondary | ICD-10-CM | POA: Diagnosis not present

## 2018-01-16 DIAGNOSIS — E782 Mixed hyperlipidemia: Secondary | ICD-10-CM | POA: Diagnosis not present

## 2018-01-16 DIAGNOSIS — K219 Gastro-esophageal reflux disease without esophagitis: Secondary | ICD-10-CM | POA: Diagnosis not present

## 2018-02-07 ENCOUNTER — Other Ambulatory Visit (HOSPITAL_COMMUNITY): Payer: Medicare HMO

## 2018-02-14 ENCOUNTER — Inpatient Hospital Stay (HOSPITAL_COMMUNITY): Payer: Medicare HMO | Attending: Hematology | Admitting: Hematology

## 2018-03-26 DIAGNOSIS — R69 Illness, unspecified: Secondary | ICD-10-CM | POA: Diagnosis not present

## 2018-04-19 DIAGNOSIS — R7301 Impaired fasting glucose: Secondary | ICD-10-CM | POA: Diagnosis not present

## 2018-04-19 DIAGNOSIS — D509 Iron deficiency anemia, unspecified: Secondary | ICD-10-CM | POA: Diagnosis not present

## 2018-04-19 DIAGNOSIS — Z6830 Body mass index (BMI) 30.0-30.9, adult: Secondary | ICD-10-CM | POA: Diagnosis not present

## 2018-04-19 DIAGNOSIS — E1169 Type 2 diabetes mellitus with other specified complication: Secondary | ICD-10-CM | POA: Diagnosis not present

## 2018-04-19 DIAGNOSIS — I10 Essential (primary) hypertension: Secondary | ICD-10-CM | POA: Diagnosis not present

## 2018-04-26 DIAGNOSIS — D72829 Elevated white blood cell count, unspecified: Secondary | ICD-10-CM | POA: Diagnosis not present

## 2018-04-26 DIAGNOSIS — D509 Iron deficiency anemia, unspecified: Secondary | ICD-10-CM | POA: Diagnosis not present

## 2018-04-26 DIAGNOSIS — R5383 Other fatigue: Secondary | ICD-10-CM | POA: Diagnosis not present

## 2018-04-26 DIAGNOSIS — J302 Other seasonal allergic rhinitis: Secondary | ICD-10-CM | POA: Diagnosis not present

## 2018-04-26 DIAGNOSIS — E1169 Type 2 diabetes mellitus with other specified complication: Secondary | ICD-10-CM | POA: Diagnosis not present

## 2018-04-26 DIAGNOSIS — E782 Mixed hyperlipidemia: Secondary | ICD-10-CM | POA: Diagnosis not present

## 2018-04-26 DIAGNOSIS — K219 Gastro-esophageal reflux disease without esophagitis: Secondary | ICD-10-CM | POA: Diagnosis not present

## 2018-04-26 DIAGNOSIS — I10 Essential (primary) hypertension: Secondary | ICD-10-CM | POA: Diagnosis not present

## 2018-04-30 ENCOUNTER — Other Ambulatory Visit: Payer: Self-pay | Admitting: Cardiovascular Disease

## 2018-05-01 DIAGNOSIS — D649 Anemia, unspecified: Secondary | ICD-10-CM | POA: Diagnosis not present

## 2018-05-14 ENCOUNTER — Ambulatory Visit (INDEPENDENT_AMBULATORY_CARE_PROVIDER_SITE_OTHER): Payer: Medicare HMO | Admitting: Internal Medicine

## 2018-05-14 ENCOUNTER — Encounter (INDEPENDENT_AMBULATORY_CARE_PROVIDER_SITE_OTHER): Payer: Self-pay | Admitting: Internal Medicine

## 2018-05-14 VITALS — BP 138/72 | HR 64 | Temp 98.0°F | Ht 66.0 in | Wt 197.7 lb

## 2018-05-14 DIAGNOSIS — R195 Other fecal abnormalities: Secondary | ICD-10-CM | POA: Diagnosis not present

## 2018-05-14 DIAGNOSIS — D509 Iron deficiency anemia, unspecified: Secondary | ICD-10-CM

## 2018-05-14 DIAGNOSIS — D508 Other iron deficiency anemias: Secondary | ICD-10-CM | POA: Diagnosis not present

## 2018-05-14 HISTORY — DX: Iron deficiency anemia, unspecified: D50.9

## 2018-05-14 NOTE — Progress Notes (Signed)
   Subjective:    Patient ID: [name], male    DOB: 1940/12/06, 77 y.o.   MRN: [id number]  HPI Referred by Dr. Nevada Crane for anemia.  Has a drop in his hemoglobin from 13.3 to 8.2 in the last 3 months per Dr. Juel Burrow records (Previous labs were not sent).  Has been seen at [facility] for leukocytosis.  Denies seeing any blood in his stool. Stools are brown in color.  No melena. His appetite is okay. No weight loss.  No abdominal pain unless he overeats. No GERD. Stool sample was negative for blood.   Hx of cardiac stents and maintained on Plavix  04/19/2018 Hemoglobin 8.2, Hematocrit 28.3, MCV 75 10/09/2017 Hemoglobin 12.2, Hematocrit 41.0, MCV 88.7   Last colonoscopy in October of 2007 by Dr. Gala Romney. (screening).   IMPRESSION:  Multiple diminutive rectal polyps destroyed as described above.  Otherwise normal rectum.  Few scattered left-sided (sigmoid) diverticula.  A  5 mm polyp at the splenic flexure cold biopsied/removed.  Remainder of the  colonic mucosa appeared normal. Biopsy:  1. COLON AT SPLENIC FLEXURE, POLYP(S): HYPERPLASTIC POLYP(S). NO ADENOMATOUS CHANGE OR MALIGNANCY IDENTIFIED.  2. RECTUM, POLYP(S): HYPERPLASTIC POLYP(S). NO ADENOMATOUS CHANGE OR MALIGNANCY IDENTIFIED.     Review of Systems Past Medical History:  Diagnosis Date  . Coronary artery disease   . Hyperlipidemia   . Hypertension   . Polio    hx of. right arm atrophy    Past Surgical History:  Procedure Laterality Date  . CARDIAC CATHETERIZATION  07-09-2007  . CHOLECYSTECTOMY    . CORONARY STENT PLACEMENT     x3 vessels bifurcated single vessel  . KNEE ARTHROPLASTY     pinning surgery    No Known Allergies  Current Outpatient Medications on File Prior to Visit  Medication Sig Dispense Refill  . clopidogrel (PLAVIX) 75 MG tablet TAKE 1 TABLET BY MOUTH ONCE DAILY 90 tablet 2  . nitroGLYCERIN (NITROSTAT) 0.4 MG SL tablet Place 1 tablet (0.4 mg total) under the tongue every 5  (five) minutes as needed. 25 tablet 3  . simvastatin (ZOCOR) 40 MG tablet Take 40 mg by mouth daily at 6 PM.      No current facility-administered medications on file prior to visit.         Objective:   Physical Exam Blood pressure 138/72, pulse 64, temperature 98 F (36.7 C), height 5\' 6"  (1.676 m), weight 197 lb 11.2 oz (89.7 kg).  Alert and oriented. Skin warm and dry. Oral mucosa is moist.   . Sclera anicteric, conjunctivae is pink. Thyroid not enlarged. No cervical lymphadenopathy. Lungs clear. Heart regular rate and rhythm.  Abdomen is soft. Bowel sounds are positive. No hepatomegaly. No abdominal masses felt. No tenderness.  No edema to lower extremities.  Rectal exam: no masses. Stool brown. Guaiac positive (not grossly).          Assessment & Plan:  IDA. Guaiac positive stool.  Will get iron studies. Needs colonoscopy to ruled out colonic neoplasm. Hx of multiple polyps. If normal, colonoscopy,  EGD.

## 2018-05-14 NOTE — Patient Instructions (Signed)
The risks of bleeding, perforation and infection were reviewed with patient.  

## 2018-05-15 ENCOUNTER — Telehealth (INDEPENDENT_AMBULATORY_CARE_PROVIDER_SITE_OTHER): Payer: Self-pay | Admitting: *Deleted

## 2018-05-15 ENCOUNTER — Other Ambulatory Visit (INDEPENDENT_AMBULATORY_CARE_PROVIDER_SITE_OTHER): Payer: Self-pay | Admitting: Internal Medicine

## 2018-05-15 ENCOUNTER — Encounter (INDEPENDENT_AMBULATORY_CARE_PROVIDER_SITE_OTHER): Payer: Self-pay | Admitting: *Deleted

## 2018-05-15 DIAGNOSIS — R195 Other fecal abnormalities: Secondary | ICD-10-CM

## 2018-05-15 DIAGNOSIS — D508 Other iron deficiency anemias: Secondary | ICD-10-CM

## 2018-05-15 DIAGNOSIS — D649 Anemia, unspecified: Secondary | ICD-10-CM | POA: Insufficient documentation

## 2018-05-15 LAB — IRON, TOTAL/TOTAL IRON BINDING CAP
%SAT: 43 % (calc) (ref 20–48)
Iron: 197 ug/dL — ABNORMAL HIGH (ref 50–180)
TIBC: 458 mcg/dL (calc) — ABNORMAL HIGH (ref 250–425)

## 2018-05-15 LAB — FERRITIN: Ferritin: 28 ng/mL (ref 24–380)

## 2018-05-15 LAB — HEMOGLOBIN AND HEMATOCRIT, BLOOD
HCT: 35.2 % — ABNORMAL LOW (ref 38.5–50.0)
Hemoglobin: 10.2 g/dL — ABNORMAL LOW (ref 13.2–17.1)

## 2018-05-15 NOTE — Telephone Encounter (Signed)
Yes ok to hold plavix 

## 2018-05-15 NOTE — Telephone Encounter (Signed)
Patient is scheduled for colonoscopy/endoscopy 05/31/18 and we need him to stop Plavix 5 days prior -- please advise if ok to stop

## 2018-05-15 NOTE — Telephone Encounter (Signed)
Dr Johnsie Cancel, Please advise if ok to hold plavix. [name]

## 2018-05-16 NOTE — Telephone Encounter (Signed)
Patient is aware ok to stop Plavix

## 2018-05-23 ENCOUNTER — Encounter (HOSPITAL_COMMUNITY): Payer: Self-pay

## 2018-05-23 DIAGNOSIS — J302 Other seasonal allergic rhinitis: Secondary | ICD-10-CM | POA: Diagnosis not present

## 2018-05-23 DIAGNOSIS — J029 Acute pharyngitis, unspecified: Secondary | ICD-10-CM | POA: Diagnosis not present

## 2018-05-23 DIAGNOSIS — E782 Mixed hyperlipidemia: Secondary | ICD-10-CM | POA: Diagnosis not present

## 2018-05-23 DIAGNOSIS — J309 Allergic rhinitis, unspecified: Secondary | ICD-10-CM | POA: Diagnosis not present

## 2018-05-23 DIAGNOSIS — E1169 Type 2 diabetes mellitus with other specified complication: Secondary | ICD-10-CM | POA: Diagnosis not present

## 2018-05-23 DIAGNOSIS — K219 Gastro-esophageal reflux disease without esophagitis: Secondary | ICD-10-CM | POA: Diagnosis not present

## 2018-05-23 DIAGNOSIS — D649 Anemia, unspecified: Secondary | ICD-10-CM | POA: Diagnosis not present

## 2018-05-23 DIAGNOSIS — I2510 Atherosclerotic heart disease of native coronary artery without angina pectoris: Secondary | ICD-10-CM | POA: Diagnosis not present

## 2018-05-23 DIAGNOSIS — D509 Iron deficiency anemia, unspecified: Secondary | ICD-10-CM | POA: Diagnosis not present

## 2018-05-23 DIAGNOSIS — D72829 Elevated white blood cell count, unspecified: Secondary | ICD-10-CM | POA: Diagnosis not present

## 2018-05-23 DIAGNOSIS — I10 Essential (primary) hypertension: Secondary | ICD-10-CM | POA: Diagnosis not present

## 2018-05-23 NOTE — Patient Instructions (Signed)
[name]  05/23/2018     @PREFPERIOPPHARMACY @   Your procedure is scheduled on  05/31/2018.  Report to [facility] at  1215   A.M.  Call this number if you have problems the morning of surgery:  [phone]   Remember:  Follow the diet and prep instructions given to you by Dr Olevia Perches office.                        Take these medicines the morning of surgery with A SIP OF WATER  Prilosec.     Do not wear jewelry, make-up or nail polish.  Do not wear lotions, powders, or perfumes, or deodorant.  Do not shave 48 hours prior to surgery.  Men may shave face and neck.  Do not bring valuables to the hospital.  [facility] is not responsible for any belongings or valuables.  Contacts, dentures or bridgework may not be worn into surgery.  Leave your suitcase in the car.  After surgery it may be brought to your room.  For patients admitted to the hospital, discharge time will be determined by your treatment team.  Patients discharged the day of surgery will not be allowed to drive home.   Name and phone number of your driver:   family Special instructions:  Follow the instructions given to you about your plavix.  Please read over the following fact sheets that you were given. Anesthesia Post-op Instructions and Care and Recovery After Surgery       Esophagogastroduodenoscopy Esophagogastroduodenoscopy (EGD) is a procedure to examine the lining of the esophagus, stomach, and first part of the small intestine (duodenum). This procedure is done to check for problems such as inflammation, bleeding, ulcers, or growths. During this procedure, a long, flexible, lighted tube with a camera attached (endoscope) is inserted down the throat. Tell a health care provider about:  Any allergies you have.  All medicines you are taking, including vitamins, herbs, eye drops, creams, and over-the-counter medicines.  Any problems you or family members have had with  anesthetic medicines.  Any blood disorders you have.  Any surgeries you have had.  Any medical conditions you have.  Whether you are pregnant or may be pregnant. What are the risks? Generally, this is a safe procedure. However, problems may occur, including:  Infection.  Bleeding.  A tear (perforation) in the esophagus, stomach, or duodenum.  Trouble breathing.  Excessive sweating.  Spasms of the larynx.  A slowed heartbeat.  Low blood pressure.  What happens before the procedure?  Follow instructions from your health care provider about eating or drinking restrictions.  Ask your health care provider about: ? Changing or stopping your regular medicines. This is especially important if you are taking diabetes medicines or blood thinners. ? Taking medicines such as aspirin and ibuprofen. These medicines can thin your blood. Do not take these medicines before your procedure if your health care provider instructs you not to.  Plan to have someone take you home after the procedure.  If you wear dentures, be ready to remove them before the procedure. What happens during the procedure?  To reduce your risk of infection, your health care team will wash or sanitize their hands.  An IV tube will be put in a vein in your hand or arm. You will get medicines and fluids through this tube.  You will be given one  or more of the following: ? A medicine to help you relax (sedative). ? A medicine to numb the area (local anesthetic). This medicine may be sprayed into your throat. It will make you feel more comfortable and keep you from gagging or coughing during the procedure. ? A medicine for pain.  A mouth guard may be placed in your mouth to protect your teeth and to keep you from biting on the endoscope.  You will be asked to lie on your left side.  The endoscope will be lowered down your throat into your esophagus, stomach, and duodenum.  Air will be put into the endoscope.  This will help your health care provider see better.  The lining of your esophagus, stomach, and duodenum will be examined.  Your health care provider may: ? Take a tissue sample so it can be looked at in a lab (biopsy). ? Remove growths. ? Remove objects (foreign bodies) that are stuck. ? Treat any bleeding with medicines or other devices that stop tissue from bleeding. ? Widen (dilate) or stretch narrowed areas of your esophagus and stomach.  The endoscope will be taken out. The procedure may vary among health care providers and hospitals. What happens after the procedure?  Your blood pressure, heart rate, breathing rate, and blood oxygen level will be monitored often until the medicines you were given have worn off.  Do not eat or drink anything until the numbing medicine has worn off and your gag reflex has returned. This information is not intended to replace advice given to you by your health care provider. Make sure you discuss any questions you have with your health care provider. Document Released: 09/29/2004 Document Revised: 11/04/2015 Document Reviewed: 04/22/2015 Elsevier Interactive Patient Education  2018 [employer]. Esophagogastroduodenoscopy, Care After Refer to this sheet in the next few weeks. These instructions provide you with information about caring for yourself after your procedure. Your health care provider may also give you more specific instructions. Your treatment has been planned according to current medical practices, but problems sometimes occur. Call your health care provider if you have any problems or questions after your procedure. What can I expect after the procedure? After the procedure, it is common to have:  A sore throat.  Nausea.  Bloating.  Dizziness.  Fatigue.  Follow these instructions at home:  Do not eat or drink anything until the numbing medicine (local anesthetic) has worn off and your gag reflex has returned. You will know  that the local anesthetic has worn off when you can swallow comfortably.  Do not drive for 24 hours if you received a medicine to help you relax (sedative).  If your health care provider took a tissue sample for testing during the procedure, make sure to get your test results. This is your responsibility. Ask your health care provider or the department performing the test when your results will be ready.  Keep all follow-up visits as told by your health care provider. This is important. Contact a health care provider if:  You cannot stop coughing.  You are not urinating.  You are urinating less than usual. Get help right away if:  You have trouble swallowing.  You cannot eat or drink.  You have throat or chest pain that gets worse.  You are dizzy or light-headed.  You faint.  You have nausea or vomiting.  You have chills.  You have a fever.  You have severe abdominal pain.  You have black, tarry, or bloody stools.  This information is not intended to replace advice given to you by your health care provider. Make sure you discuss any questions you have with your health care provider. Document Released: 05/15/2012 Document Revised: 11/04/2015 Document Reviewed: 04/22/2015 Elsevier Interactive Patient Education  2018 [employer].  Colonoscopy, Adult A colonoscopy is an exam to look at the large intestine. It is done to check for problems, such as:  Lumps (tumors).  Growths (polyps).  Swelling (inflammation).  Bleeding.  What happens before the procedure? Eating and drinking Follow instructions from your doctor about eating and drinking. These instructions may include:  A few days before the procedure - follow a low-fiber diet. ? Avoid nuts. ? Avoid seeds. ? Avoid dried fruit. ? Avoid raw fruits. ? Avoid vegetables.  1-3 days before the procedure - follow a clear liquid diet. Avoid liquids that have red or purple dye. Drink only clear liquids, such  as: ? Clear broth or bouillon. ? Black coffee or tea. ? Clear juice. ? Clear soft drinks or sports drinks. ? Gelatin dessert. ? Popsicles.  On the day of the procedure - do not eat or drink anything during the 2 hours before the procedure.  Bowel prep If you were prescribed an oral bowel prep:  Take it as told by your doctor. Starting the day before your procedure, you will need to drink a lot of liquid. The liquid will cause you to poop (have bowel movements) until your poop is almost clear or light green.  If your skin or butt gets irritated from diarrhea, you may: ? Wipe the area with wipes that have medicine in them, such as adult wet wipes with aloe and vitamin E. ? Put something on your skin that soothes the area, such as petroleum jelly.  If you throw up (vomit) while drinking the bowel prep, take a break for up to 60 minutes. Then begin the bowel prep again. If you keep throwing up and you cannot take the bowel prep without throwing up, call your doctor.  General instructions  Ask your doctor about changing or stopping your normal medicines. This is important if you take diabetes medicines or blood thinners.  Plan to have someone take you home from the hospital or clinic. What happens during the procedure?  An IV tube may be put into one of your veins.  You will be given medicine to help you relax (sedative).  To reduce your risk of infection: ? Your doctors will wash their hands. ? Your anal area will be washed with soap.  You will be asked to lie on your side with your knees bent.  Your doctor will get a long, thin, flexible tube ready. The tube will have a camera and a light on the end.  The tube will be put into your anus.  The tube will be gently put into your large intestine.  Air will be delivered into your large intestine to keep it open. You may feel some pressure or cramping.  The camera will be used to take photos.  A small tissue sample may be  removed from your body to be looked at under a microscope (biopsy). If any possible problems are found, the tissue will be sent to a lab for testing.  If small growths are found, your doctor may remove them and have them checked for cancer.  The tube that was put into your anus will be slowly removed. The procedure may vary among doctors and hospitals. What happens after the  procedure?  Your doctor will check on you often until the medicines you were given have worn off.  Do not drive for 24 hours after the procedure.  You may have a small amount of blood in your poop.  You may pass gas.  You may have mild cramps or bloating in your belly (abdomen).  It is up to you to get the results of your procedure. Ask your doctor, or the department performing the procedure, when your results will be ready. This information is not intended to replace advice given to you by your health care provider. Make sure you discuss any questions you have with your health care provider. Document Released: 07/01/2010 Document Revised: 03/29/2016 Document Reviewed: 08/10/2015 Elsevier Interactive Patient Education  2017 [employer]  Colonoscopy, Adult, Care After This sheet gives you information about how to care for yourself after your procedure. Your health care provider may also give you more specific instructions. If you have problems or questions, contact your health care provider. What can I expect after the procedure? After the procedure, it is common to have:  A small amount of blood in your stool for 24 hours after the procedure.  Some gas.  Mild abdominal cramping or bloating.  Follow these instructions at home: General instructions   For the first 24 hours after the procedure: ? Do not drive or use machinery. ? Do not sign important documents. ? Do not drink alcohol. ? Do your regular daily activities at a slower pace than normal. ? Eat soft, easy-to-digest foods. ? Rest  often.  Take over-the-counter or prescription medicines only as told by your health care provider.  It is up to you to get the results of your procedure. Ask your health care provider, or the department performing the procedure, when your results will be ready. Relieving cramping and bloating  Try walking around when you have cramps or feel bloated.  Apply heat to your abdomen as told by your health care provider. Use a heat source that your health care provider recommends, such as a moist heat pack or a heating pad. ? Place a towel between your skin and the heat source. ? Leave the heat on for 20-30 minutes. ? Remove the heat if your skin turns bright red. This is especially important if you are unable to feel pain, heat, or cold. You may have a greater risk of getting burned. Eating and drinking  Drink enough fluid to keep your urine clear or pale yellow.  Resume your normal diet as instructed by your health care provider. Avoid heavy or fried foods that are hard to digest.  Avoid drinking alcohol for as long as instructed by your health care provider. Contact a health care provider if:  You have blood in your stool 2-3 days after the procedure. Get help right away if:  You have more than a small spotting of blood in your stool.  You pass large blood clots in your stool.  Your abdomen is swollen.  You have nausea or vomiting.  You have a fever.  You have increasing abdominal pain that is not relieved with medicine. This information is not intended to replace advice given to you by your health care provider. Make sure you discuss any questions you have with your health care provider. Document Released: 01/11/2004 Document Revised: 02/21/2016 Document Reviewed: 08/10/2015 Elsevier Interactive Patient Education  2018 [place] Anesthesia is a term that refers to techniques, procedures, and medicines that help a  person stay safe and comfortable  during a medical procedure. Monitored anesthesia care, or sedation, is one type of anesthesia. Your anesthesia specialist may recommend sedation if you will be having a procedure that does not require you to be unconscious, such as:  Cataract surgery.  A dental procedure.  A biopsy.  A colonoscopy.  During the procedure, you may receive a medicine to help you relax (sedative). There are three levels of sedation:  Mild sedation. At this level, you may feel awake and relaxed. You will be able to follow directions.  Moderate sedation. At this level, you will be sleepy. You may not remember the procedure.  Deep sedation. At this level, you will be asleep. You will not remember the procedure.  The more medicine you are given, the deeper your level of sedation will be. Depending on how you respond to the procedure, the anesthesia specialist may change your level of sedation or the type of anesthesia to fit your needs. An anesthesia specialist will monitor you closely during the procedure. Let your health care provider know about:  Any allergies you have.  All medicines you are taking, including vitamins, herbs, eye drops, creams, and over-the-counter medicines.  Any use of steroids (by mouth or as a cream).  Any problems you or family members have had with sedatives and anesthetic medicines.  Any blood disorders you have.  Any surgeries you have had.  Any medical conditions you have, such as sleep apnea.  Whether you are pregnant or may be pregnant.  Any use of cigarettes, alcohol, or street drugs. What are the risks? Generally, this is a safe procedure. However, problems may occur, including:  Getting too much medicine (oversedation).  Nausea.  Allergic reaction to medicines.  Trouble breathing. If this happens, a breathing tube may be used to help with breathing. It will be removed when you are awake and breathing on your own.  Heart trouble.  Lung trouble.  Before  the procedure Staying hydrated Follow instructions from your health care provider about hydration, which may include:  Up to 2 hours before the procedure - you may continue to drink clear liquids, such as water, clear fruit juice, black coffee, and plain tea.  Eating and drinking restrictions Follow instructions from your health care provider about eating and drinking, which may include:  8 hours before the procedure - stop eating heavy meals or foods such as meat, fried foods, or fatty foods.  6 hours before the procedure - stop eating light meals or foods, such as toast or cereal.  6 hours before the procedure - stop drinking milk or drinks that contain milk.  2 hours before the procedure - stop drinking clear liquids.  Medicines Ask your health care provider about:  Changing or stopping your regular medicines. This is especially important if you are taking diabetes medicines or blood thinners.  Taking medicines such as aspirin and ibuprofen. These medicines can thin your blood. Do not take these medicines before your procedure if your health care provider instructs you not to.  Tests and exams  You will have a physical exam.  You may have blood tests done to show: ? How well your kidneys and liver are working. ? How well your blood can clot.  General instructions  Plan to have someone take you home from the hospital or clinic.  If you will be going home right after the procedure, plan to have someone with you for 24 hours.  What happens during the procedure?  Your blood pressure, heart rate, breathing, level of pain and overall condition will be monitored.  An IV tube will be inserted into one of your veins.  Your anesthesia specialist will give you medicines as needed to keep you comfortable during the procedure. This may mean changing the level of sedation.  The procedure will be performed. After the procedure  Your blood pressure, heart rate, breathing rate, and  blood oxygen level will be monitored until the medicines you were given have worn off.  Do not drive for 24 hours if you received a sedative.  You may: ? Feel sleepy, clumsy, or nauseous. ? Feel forgetful about what happened after the procedure. ? Have a sore throat if you had a breathing tube during the procedure. ? Vomit. This information is not intended to replace advice given to you by your health care provider. Make sure you discuss any questions you have with your health care provider. Document Released: 02/22/2005 Document Revised: 11/05/2015 Document Reviewed: 09/19/2015 Elsevier Interactive Patient Education  2018 [place], Care After These instructions provide you with information about caring for yourself after your procedure. Your health care provider may also give you more specific instructions. Your treatment has been planned according to current medical practices, but problems sometimes occur. Call your health care provider if you have any problems or questions after your procedure. What can I expect after the procedure? After your procedure, it is common to:  Feel sleepy for several hours.  Feel clumsy and have poor balance for several hours.  Feel forgetful about what happened after the procedure.  Have poor judgment for several hours.  Feel nauseous or vomit.  Have a sore throat if you had a breathing tube during the procedure.  Follow these instructions at home: For at least 24 hours after the procedure:   Do not: ? Participate in activities in which you could fall or become injured. ? Drive. ? Use heavy machinery. ? Drink alcohol. ? Take sleeping pills or medicines that cause drowsiness. ? Make important decisions or sign legal documents. ? Take care of children on your own.  Rest. Eating and drinking  Follow the diet that is recommended by your health care provider.  If you vomit, drink water, juice, or soup when you  can drink without vomiting.  Make sure you have little or no nausea before eating solid foods. General instructions  Have a responsible adult stay with you until you are awake and alert.  Take over-the-counter and prescription medicines only as told by your health care provider.  If you smoke, do not smoke without supervision.  Keep all follow-up visits as told by your health care provider. This is important. Contact a health care provider if:  You keep feeling nauseous or you keep vomiting.  You feel light-headed.  You develop a rash.  You have a fever. Get help right away if:  You have trouble breathing. This information is not intended to replace advice given to you by your health care provider. Make sure you discuss any questions you have with your health care provider. Document Released: 09/19/2015 Document Revised: 01/19/2016 Document Reviewed: 09/19/2015 Elsevier Interactive Patient Education  [employer].

## 2018-05-27 ENCOUNTER — Other Ambulatory Visit: Payer: Self-pay

## 2018-05-27 ENCOUNTER — Encounter (HOSPITAL_COMMUNITY): Payer: Self-pay

## 2018-05-27 ENCOUNTER — Encounter (HOSPITAL_COMMUNITY)
Admission: RE | Admit: 2018-05-27 | Discharge: 2018-05-27 | Disposition: A | Discharge status: Home / Self Care | Payer: Medicare HMO | Source: Ambulatory Visit | Attending: Internal Medicine | Admitting: Internal Medicine

## 2018-05-27 DIAGNOSIS — Z01812 Encounter for preprocedural laboratory examination: Secondary | ICD-10-CM | POA: Diagnosis not present

## 2018-05-27 DIAGNOSIS — D508 Other iron deficiency anemias: Secondary | ICD-10-CM | POA: Diagnosis not present

## 2018-05-27 DIAGNOSIS — R195 Other fecal abnormalities: Secondary | ICD-10-CM

## 2018-05-27 HISTORY — DX: Gastro-esophageal reflux disease without esophagitis: K21.9

## 2018-05-27 HISTORY — DX: Unspecified hearing loss, unspecified ear: H91.90

## 2018-05-27 LAB — CBC
HCT: 43.1 % (ref 39.0–52.0)
Hemoglobin: 11.8 g/dL — ABNORMAL LOW (ref 13.0–17.0)
MCH: 23.6 pg — ABNORMAL LOW (ref 26.0–34.0)
MCHC: 27.4 g/dL — ABNORMAL LOW (ref 30.0–36.0)
MCV: 86 fL (ref 80.0–100.0)
Platelets: 331 10*3/uL (ref 150–400)
RBC: 5.01 MIL/uL (ref 4.22–5.81)
RDW: 24.4 % — ABNORMAL HIGH (ref 11.5–15.5)
WBC: 12 10*3/uL — ABNORMAL HIGH (ref 4.0–10.5)
nRBC: 0 % (ref 0.0–0.2)

## 2018-05-31 ENCOUNTER — Ambulatory Visit (HOSPITAL_COMMUNITY): Payer: Medicare HMO | Admitting: Anesthesiology

## 2018-05-31 ENCOUNTER — Encounter (HOSPITAL_COMMUNITY)
Admission: RE | Disposition: A | Discharge status: Home / Self Care | Payer: Self-pay | Source: Home / Self Care | Attending: Internal Medicine

## 2018-05-31 ENCOUNTER — Ambulatory Visit (HOSPITAL_COMMUNITY)
Admission: RE | Admit: 2018-05-31 | Discharge: 2018-05-31 | Disposition: A | Discharge status: Home / Self Care | Payer: Medicare HMO | Attending: Internal Medicine | Admitting: Internal Medicine

## 2018-05-31 ENCOUNTER — Encounter (HOSPITAL_COMMUNITY): Payer: Self-pay | Admitting: *Deleted

## 2018-05-31 DIAGNOSIS — Z79899 Other long term (current) drug therapy: Secondary | ICD-10-CM | POA: Insufficient documentation

## 2018-05-31 DIAGNOSIS — I2510 Atherosclerotic heart disease of native coronary artery without angina pectoris: Secondary | ICD-10-CM | POA: Insufficient documentation

## 2018-05-31 DIAGNOSIS — Z8612 Personal history of poliomyelitis: Secondary | ICD-10-CM | POA: Diagnosis not present

## 2018-05-31 DIAGNOSIS — D125 Benign neoplasm of sigmoid colon: Secondary | ICD-10-CM | POA: Diagnosis not present

## 2018-05-31 DIAGNOSIS — K644 Residual hemorrhoidal skin tags: Secondary | ICD-10-CM | POA: Insufficient documentation

## 2018-05-31 DIAGNOSIS — K635 Polyp of colon: Secondary | ICD-10-CM | POA: Diagnosis not present

## 2018-05-31 DIAGNOSIS — Z955 Presence of coronary angioplasty implant and graft: Secondary | ICD-10-CM | POA: Insufficient documentation

## 2018-05-31 DIAGNOSIS — R195 Other fecal abnormalities: Secondary | ICD-10-CM | POA: Insufficient documentation

## 2018-05-31 DIAGNOSIS — K31811 Angiodysplasia of stomach and duodenum with bleeding: Secondary | ICD-10-CM | POA: Insufficient documentation

## 2018-05-31 DIAGNOSIS — Z87891 Personal history of nicotine dependence: Secondary | ICD-10-CM | POA: Insufficient documentation

## 2018-05-31 DIAGNOSIS — Z8249 Family history of ischemic heart disease and other diseases of the circulatory system: Secondary | ICD-10-CM | POA: Diagnosis not present

## 2018-05-31 DIAGNOSIS — D508 Other iron deficiency anemias: Secondary | ICD-10-CM

## 2018-05-31 DIAGNOSIS — K219 Gastro-esophageal reflux disease without esophagitis: Secondary | ICD-10-CM | POA: Diagnosis not present

## 2018-05-31 DIAGNOSIS — Z7902 Long term (current) use of antithrombotics/antiplatelets: Secondary | ICD-10-CM | POA: Diagnosis not present

## 2018-05-31 DIAGNOSIS — Z9049 Acquired absence of other specified parts of digestive tract: Secondary | ICD-10-CM | POA: Insufficient documentation

## 2018-05-31 DIAGNOSIS — D649 Anemia, unspecified: Secondary | ICD-10-CM

## 2018-05-31 DIAGNOSIS — E785 Hyperlipidemia, unspecified: Secondary | ICD-10-CM | POA: Insufficient documentation

## 2018-05-31 DIAGNOSIS — I10 Essential (primary) hypertension: Secondary | ICD-10-CM | POA: Diagnosis not present

## 2018-05-31 DIAGNOSIS — H9190 Unspecified hearing loss, unspecified ear: Secondary | ICD-10-CM | POA: Diagnosis not present

## 2018-05-31 DIAGNOSIS — D500 Iron deficiency anemia secondary to blood loss (chronic): Secondary | ICD-10-CM | POA: Insufficient documentation

## 2018-05-31 HISTORY — PX: ESOPHAGOGASTRODUODENOSCOPY (EGD) WITH PROPOFOL: SHX5813

## 2018-05-31 HISTORY — PX: POLYPECTOMY: SHX5525

## 2018-05-31 HISTORY — PX: COLONOSCOPY WITH PROPOFOL: SHX5780

## 2018-05-31 SURGERY — COLONOSCOPY WITH PROPOFOL
Anesthesia: Monitor Anesthesia Care

## 2018-05-31 MED ORDER — LABETALOL HCL 5 MG/ML IV SOLN
INTRAVENOUS | Status: DC | PRN
  Administered 2018-05-31 (×2): 5 mg via INTRAVENOUS

## 2018-05-31 MED ORDER — GLYCOPYRROLATE 0.2 MG/ML IJ SOLN
INTRAMUSCULAR | Status: DC | PRN
  Administered 2018-05-31: 0.2 mg via INTRAVENOUS

## 2018-05-31 MED ORDER — CHLORHEXIDINE GLUCONATE CLOTH 2 % EX PADS: 6.0000 | MEDICATED_PAD | Freq: Once | CUTANEOUS | Status: DC

## 2018-05-31 MED ORDER — LABETALOL HCL 5 MG/ML IV SOLN
INTRAVENOUS | Status: AC
  Filled 2018-05-31: qty 4

## 2018-05-31 MED ORDER — PROPOFOL 500 MG/50ML IV EMUL
INTRAVENOUS | Status: DC | PRN
  Administered 2018-05-31: 14:00:00 via INTRAVENOUS
  Administered 2018-05-31: 150 ug/kg/min via INTRAVENOUS
  Administered 2018-05-31: 14:00:00 via INTRAVENOUS

## 2018-05-31 MED ORDER — LACTATED RINGERS IV SOLN
INTRAVENOUS | Status: DC
  Administered 2018-05-31: 13:00:00 via INTRAVENOUS

## 2018-05-31 MED ORDER — PHENYLEPHRINE 40 MCG/ML (10ML) SYRINGE FOR IV PUSH (FOR BLOOD PRESSURE SUPPORT)
PREFILLED_SYRINGE | INTRAVENOUS | Status: AC
  Filled 2018-05-31: qty 10

## 2018-05-31 MED ORDER — STERILE WATER FOR IRRIGATION IR SOLN
Status: DC | PRN
  Administered 2018-05-31: 1.5 mL

## 2018-05-31 MED ORDER — PROPOFOL 10 MG/ML IV BOLUS
INTRAVENOUS | Status: AC
  Filled 2018-05-31: qty 40

## 2018-05-31 MED ORDER — PROPOFOL 10 MG/ML IV BOLUS
INTRAVENOUS | Status: DC | PRN
  Administered 2018-05-31: 30 mg via INTRAVENOUS

## 2018-05-31 NOTE — Anesthesia Preprocedure Evaluation (Signed)
Anesthesia Evaluation  Patient identified by MRN, date of birth, ID band Patient awake    Reviewed: Allergy & Precautions, H&P , NPO status , Patient's Chart, lab work & pertinent test results  Airway Mallampati: II  TM Distance: >3 FB Neck ROM: full    Dental  (+) Partial Upper   Pulmonary neg pulmonary ROS, former smoker,    Pulmonary exam normal breath sounds clear to auscultation       Cardiovascular Exercise Tolerance: Good hypertension, + CAD and + Peripheral Vascular Disease  + Valvular Problems/Murmurs  Rhythm:regular Rate:Normal     Neuro/Psych negative neurological ROS  negative psych ROS   GI/Hepatic Neg liver ROS, GERD  ,  Endo/Other  negative endocrine ROS  Renal/GU negative Renal ROS  negative genitourinary   Musculoskeletal   Abdominal   Peds  Hematology  (+) Blood dyscrasia, anemia ,   Anesthesia Other Findings   Reproductive/Obstetrics negative OB ROS                             Anesthesia Physical Anesthesia Plan  ASA: III  Anesthesia Plan: MAC   Post-op Pain Management:    Induction:   PONV Risk Score and Plan:   Airway Management Planned:   Additional Equipment:   Intra-op Plan:   Post-operative Plan:   Informed Consent: I have reviewed the patients History and Physical, chart, labs and discussed the procedure including the risks, benefits and alternatives for the proposed anesthesia with the patient or authorized representative who has indicated his/her understanding and acceptance.   Dental Advisory Given  Plan Discussed with: CRNA  Anesthesia Plan Comments:         Anesthesia Quick Evaluation

## 2018-05-31 NOTE — H&P (Signed)
[name] is an 77 y.o. male.   Chief Complaint: Patient is here for EGD and colonoscopy. HPI: Patient is 77 year old Caucasian male who has history of GERD and lately has been using PPI on as-needed basis who was found to have anemia.  His hemoglobin on 04/19/2018 was 8.2.  Previously had been 13.3.  He was found to have iron deficiency anemia.  Patient denies melena or rectal bleeding abdominal pain nausea vomiting.  He has good appetite.  He has lost few pounds voluntarily.  When patient was seen in our office his stool was guaiac positive.  He was begun on p.o. iron and his hemoglobin 4 days ago was 11.8. He is on Plavix which is on hold.  He does not take aspirin or other NSAIDs. His colonoscopy was in October 2007 revealing left-sided diverticula splenic flexure polyp as well as rectal polyps and these were hyperplastic. Family history is negative for CRC.  Past Medical History:  Diagnosis Date  . Coronary artery disease   . GERD (gastroesophageal reflux disease)   . HOH (hard of hearing)   . Hyperlipidemia   . Hypertension   . Iron deficiency anemia 05/14/2018  . Polio    hx of. right arm atrophy    Past Surgical History:  Procedure Laterality Date  . CARDIAC CATHETERIZATION  07-09-2007  . CHOLECYSTECTOMY    . CORONARY STENT PLACEMENT     x3 vessels bifurcated single vessel  . KNEE ARTHROPLASTY Left    pinning surgery    Family History  Problem Relation Age of Onset  . Heart attack Brother 9       died  . Hypertension Mother   . Heart attack Father    Social History:  reports that he quit smoking about 13 years ago. His smoking use included cigarettes. He has a 15.00 pack-year smoking history. He has never used smokeless tobacco. He reports that he does not drink alcohol or use drugs.  Allergies: No Known Allergies  Medications Prior to Admission  Medication Sig Dispense Refill  . clopidogrel (PLAVIX) 75 MG tablet TAKE 1 TABLET BY MOUTH ONCE DAILY (Patient taking  differently: Take 75 mg by mouth daily. ) 90 tablet 2  . ferrous sulfate 325 (65 FE) MG EC tablet Take 325 mg by mouth daily.     . nitroGLYCERIN (NITROSTAT) 0.4 MG SL tablet Place 1 tablet (0.4 mg total) under the tongue every 5 (five) minutes as needed. (Patient taking differently: Place 0.4 mg under the tongue every 5 (five) minutes as needed for chest pain. ) 25 tablet 3  . omeprazole (PRILOSEC) 20 MG capsule Take 20 mg by mouth daily as needed (for acid reflux).   0  . simvastatin (ZOCOR) 40 MG tablet Take 40 mg by mouth at bedtime.       No results found for this or any previous visit (from the past 48 hour(s)). No results found.  ROS  Blood pressure (!) 158/77, pulse 61, temperature 98.5 F (36.9 C), temperature source Oral, resp. rate 18, SpO2 98 %. Physical Exam  Constitutional: He appears well-developed and well-nourished.  HENT:  Mouth/Throat: Oropharynx is clear and moist.  Eyes: Conjunctivae are normal. No scleral icterus.  Neck: No thyromegaly present.  Cardiovascular: Normal rate and regular rhythm.  Murmur heard. Faint systolic ejection murmur heard at aortic area as well as left sternal border.  Respiratory: Effort normal and breath sounds normal.  GI: Soft. He exhibits no distension and no mass. There is no  abdominal tenderness.  Musculoskeletal:        General: Edema present.  Lymphadenopathy:    He has no cervical adenopathy.  Neurological: He is alert.  Skin: Skin is warm and dry.     Assessment/Plan Iron deficiency anemia and heme positive stool. Diagnostic EGD and colonoscopy.  Hildred Laser, MD 05/31/2018, 1:23 PM

## 2018-05-31 NOTE — Transfer of Care (Signed)
Immediate Anesthesia Transfer of Care Note  Patient: [name]  Procedure(s) Performed: COLONOSCOPY WITH PROPOFOL (N/A ) ESOPHAGOGASTRODUODENOSCOPY (EGD) WITH PROPOFOL (N/A ) POLYPECTOMY  Patient Location: PACU  Anesthesia Type:MAC  Level of Consciousness: awake, alert , oriented and patient cooperative  Airway & Oxygen Therapy: Patient Spontanous Breathing  Post-op Assessment: Report given to RN and Post -op Vital signs reviewed and stable  Post vital signs: Reviewed and stable  Last Vitals:  Vitals Value Taken Time  BP 116/60 05/31/2018  2:35 PM  Temp 36.4 C 05/31/2018  2:34 PM  Pulse 77 05/31/2018  2:40 PM  Resp 30 05/31/2018  2:40 PM  SpO2 98 % 05/31/2018  2:40 PM  Vitals shown include unvalidated device data.  Last Pain:  Vitals:   05/31/18 1434  TempSrc:   PainSc: 0-No pain      Patients Stated Pain Goal: 8 (62/86/38 1771)  Complications: No apparent anesthesia complications

## 2018-05-31 NOTE — Op Note (Signed)
[facility] Patient Name: [name] Procedure Date: 05/31/2018 1:59 PM MRN: [id number] Date of Birth: 1940/11/10 Attending MD: Hildred Laser , MD CSN: [id number] Age: 77 Admit Type: Outpatient Procedure:                Colonoscopy Indications:              Iron deficiency anemia secondary to chronic blood                            loss Providers:                Hildred Laser, MD, Lurline Del, RN, Gerome Sam,                            RN, Nelma Rothman, Technician Referring MD:             Delphina Cahill, MD Medicines:                Propofol per Anesthesia Complications:            No immediate complications. Estimated Blood Loss:     Estimated blood loss was minimal. Procedure:                Pre-Anesthesia Assessment:                           - Prior to the procedure, a History and Physical                            was performed, and patient medications and                            allergies were reviewed. The patient's tolerance of                            previous anesthesia was also reviewed. The risks                            and benefits of the procedure and the sedation                            options and risks were discussed with the patient.                            All questions were answered, and informed consent                            was obtained. Prior Anticoagulants: The patient                            last took Plavix (clopidogrel) 5 days prior to the                            procedure. ASA Grade Assessment: III - A patient  with severe systemic disease. After reviewing the                            risks and benefits, the patient was deemed in                            satisfactory condition to undergo the procedure.                           After obtaining informed consent, the colonoscope                            was passed under direct vision. Throughout the                            procedure, the  patient's blood pressure, pulse, and                            oxygen saturations were monitored continuously. The                            [id number]-[id number] ([id number]) scope was introduced through                            the anus and advanced to the the cecum, identified                            by appendiceal orifice and ileocecal valve. The                            colonoscopy was technically difficult and complex                            due to inadequate bowel prep and significant                            looping. Successful completion of the procedure was                            aided by using manual pressure. The patient                            tolerated the procedure well. The quality of the                            bowel preparation was fair. The ileocecal valve,                            the appendiceal orifice and the rectum were                            photographed. Scope In: 2:02:11 PM Scope Out: 2:23:35 PM Scope Withdrawal Time: 0 hours 9 minutes 0 seconds  Total Procedure Duration: 0  hours 21 minutes 24 seconds  Findings:      The perianal and digital rectal examinations were normal.      A small polyp was found in the distal sigmoid colon. The polyp was       sessile. The polyp was removed with a cold snare. Resection and       retrieval were complete. The pathology specimen was placed into Bottle       Number 1.      The exam was otherwise normal throughout the examined colon.      External hemorrhoids were found during retroflexion. The hemorrhoids       small. Impression:               - Preparation of the colon was fair.                           - One small polyp in the distal sigmoid colon,                            removed with a cold snare. Resected and retrieved.                           - External hemorrhoids. Moderate Sedation:      Per Anesthesia Care Recommendation:           - Patient has a contact number available for                             emergencies. The signs and symptoms of potential                            delayed complications were discussed with the                            patient. Return to normal activities tomorrow.                            Written discharge instructions were provided to the                            patient.                           - Resume previous diet today.                           - Continue present medications.                           - Resume Plavix (clopidogrel) at prior dose in 5                            days.                           - Await pathology results. Procedure Code(s):        --- Professional ---  45385, Colonoscopy, flexible; with removal of                            tumor(s), polyp(s), or other lesion(s) by snare                            technique Diagnosis Code(s):        --- Professional ---                           K64.4, Residual hemorrhoidal skin tags                           D12.5, Benign neoplasm of sigmoid colon                           D50.0, Iron deficiency anemia secondary to blood                            loss (chronic) CPT copyright 2018 American Medical Association. All rights reserved. The codes documented in this report are preliminary and upon coder review may  be revised to meet current compliance requirements. Hildred Laser, MD Hildred Laser, MD 05/31/2018 2:45:34 PM This report has been signed electronically. Number of Addenda: 0

## 2018-05-31 NOTE — Anesthesia Procedure Notes (Signed)
Procedure Name: MAC Performed by: Adams, Amy A, CRNA Pre-anesthesia Checklist: Patient identified, Emergency Drugs available, Suction available, Timeout performed and Patient being monitored Patient Re-evaluated:Patient Re-evaluated prior to induction Oxygen Delivery Method: Non-rebreather mask       

## 2018-05-31 NOTE — Anesthesia Postprocedure Evaluation (Signed)
Anesthesia Post Note  Patient: [name]  Procedure(s) Performed: COLONOSCOPY WITH PROPOFOL (N/A ) ESOPHAGOGASTRODUODENOSCOPY (EGD) WITH PROPOFOL (N/A ) POLYPECTOMY  Patient location during evaluation: PACU Anesthesia Type: MAC Level of consciousness: awake and alert and oriented Pain management: pain level controlled Vital Signs Assessment: post-procedure vital signs reviewed and stable Respiratory status: spontaneous breathing Cardiovascular status: stable Postop Assessment: no apparent nausea or vomiting Anesthetic complications: no     Last Vitals:  Vitals:   05/31/18 1227 05/31/18 1434  BP: (!) 158/77 116/60  Pulse: 61 79  Resp: 18 20  Temp: 36.9 C (!) 36.4 C  SpO2: 98% 100%    Last Pain:  Vitals:   05/31/18 1434  TempSrc:   PainSc: 0-No pain                 Keyanna Sandefer A

## 2018-05-31 NOTE — Op Note (Addendum)
[facility] Patient Name: [name] Procedure Date: 05/31/2018 1:28 PM MRN: [id number] Date of Birth: August 02, 1940 Attending MD: Hildred Laser , MD CSN: [id number] Age: 77 Admit Type: Outpatient Procedure:                Upper GI endoscopy Indications:              Iron deficiency anemia secondary to chronic blood                            loss Providers:                Hildred Laser, MD, Lurline Del, RN, Gerome Sam,                            RN, Nelma Rothman, Technician Referring MD:             Delphina Cahill, MD Medicines:                Propofol per Anesthesia Complications:            No immediate complications. Estimated Blood Loss:     Estimated blood loss was minimal. Procedure:                Pre-Anesthesia Assessment:                           - Prior to the procedure, a History and Physical                            was performed, and patient medications and                            allergies were reviewed. The patient's tolerance of                            previous anesthesia was also reviewed. The risks                            and benefits of the procedure and the sedation                            options and risks were discussed with the patient.                            All questions were answered, and informed consent                            was obtained. Prior Anticoagulants: The patient                            last took Plavix (clopidogrel) 5 days prior to the                            procedure. ASA Grade Assessment: III - A patient  with severe systemic disease. After reviewing the                            risks and benefits, the patient was deemed in                            satisfactory condition to undergo the procedure.                           After obtaining informed consent, the endoscope was                            passed under direct vision. Throughout the                            procedure,  the patient's blood pressure, pulse, and                            oxygen saturations were monitored continuously. The                            [id number]-[id number] ([id number]) scope was introduced through the                            mouth, and advanced to the second part of duodenum.                            The patient tolerated the procedure well. The upper                            GI endoscopy was accomplished without difficulty. Scope In: 1:37:02 PM Scope Out: 1:56:47 PM Total Procedure Duration: 0 hours 19 minutes 45 seconds  Findings:      The examined esophagus was normal.      The Z-line was regular and was found 41 cm from the incisors.      A single 6 mm bleeding angiodysplastic lesion was found in the gastric       body and on the posterior wall of the stomach. Coagulation for       hemostasis using heater probe was unsuccessful. To stop active bleeding,       three hemostatic clips were successfully placed (MR conditional).       Coagulation for hemostasis using argon plasma was successful.      The exam of the stomach was otherwise normal.      The duodenal bulb and second portion of the duodenum were normal. Impression:               - Normal esophagus.                           - Z-line regular, 41 cm from the incisors.                           - A single bleeding angiodysplastic lesion in the  stomach. . Treated with a heater probe but kept                            bleeding. Clips (MR conditional) were placed.                            Treated with argon plasma coagulation (APC).                           - Normal duodenal bulb and second portion of the                            duodenum.                           - No specimens collected. Moderate Sedation:      Per Anesthesia Care Recommendation:           - Patient has a contact number available for                            emergencies. The signs and symptoms of potential                             delayed complications were discussed with the                            patient. Return to normal activities tomorrow.                            Written discharge instructions were provided to the                            patient.                           - Resume previous diet today.                           - Continue present medications.                           - Resume Plavix (clopidogrel) at prior dose in 5                            days.                           - See the other procedure note for documentation of                            additional recommendations. Procedure Code(s):        --- Professional ---                           [id number], Esophagogastroduodenoscopy, flexible,  transoral; with control of bleeding, any method Diagnosis Code(s):        --- Professional ---                           K31.811, Angiodysplasia of stomach and duodenum                            with bleeding                           D50.0, Iron deficiency anemia secondary to blood                            loss (chronic) CPT copyright 2018 American Medical Association. All rights reserved. The codes documented in this report are preliminary and upon coder review may  be revised to meet current compliance requirements. Hildred Laser, MD Hildred Laser, MD 05/31/2018 2:38:59 PM This report has been signed electronically. Number of Addenda: 0

## 2018-05-31 NOTE — Discharge Instructions (Signed)
Resume clopidogrel on Christmas Day usual dose. Resume other medications as before. Resume usual diet. No driving for 24 hours. Physician will call with biopsy results. Will check hemoglobin in 4 weeks.

## 2018-06-07 ENCOUNTER — Encounter (HOSPITAL_COMMUNITY): Payer: Self-pay | Admitting: Internal Medicine

## 2018-06-11 ENCOUNTER — Other Ambulatory Visit (INDEPENDENT_AMBULATORY_CARE_PROVIDER_SITE_OTHER): Payer: Self-pay | Admitting: *Deleted

## 2018-06-11 ENCOUNTER — Encounter (INDEPENDENT_AMBULATORY_CARE_PROVIDER_SITE_OTHER): Payer: Self-pay | Admitting: *Deleted

## 2018-06-11 DIAGNOSIS — D508 Other iron deficiency anemias: Secondary | ICD-10-CM

## 2018-06-11 DIAGNOSIS — R195 Other fecal abnormalities: Secondary | ICD-10-CM

## 2018-06-24 DIAGNOSIS — I2510 Atherosclerotic heart disease of native coronary artery without angina pectoris: Secondary | ICD-10-CM | POA: Diagnosis not present

## 2018-06-24 DIAGNOSIS — E669 Obesity, unspecified: Secondary | ICD-10-CM | POA: Diagnosis not present

## 2018-06-24 DIAGNOSIS — Z833 Family history of diabetes mellitus: Secondary | ICD-10-CM | POA: Diagnosis not present

## 2018-06-24 DIAGNOSIS — E785 Hyperlipidemia, unspecified: Secondary | ICD-10-CM | POA: Diagnosis not present

## 2018-06-24 DIAGNOSIS — B91 Sequelae of poliomyelitis: Secondary | ICD-10-CM | POA: Diagnosis not present

## 2018-06-24 DIAGNOSIS — Z6830 Body mass index (BMI) 30.0-30.9, adult: Secondary | ICD-10-CM | POA: Diagnosis not present

## 2018-06-24 DIAGNOSIS — K219 Gastro-esophageal reflux disease without esophagitis: Secondary | ICD-10-CM | POA: Diagnosis not present

## 2018-06-24 DIAGNOSIS — Z008 Encounter for other general examination: Secondary | ICD-10-CM | POA: Diagnosis not present

## 2018-06-24 DIAGNOSIS — Z8249 Family history of ischemic heart disease and other diseases of the circulatory system: Secondary | ICD-10-CM | POA: Diagnosis not present

## 2018-06-24 DIAGNOSIS — M8960 Osteopathy after poliomyelitis, unspecified site: Secondary | ICD-10-CM | POA: Diagnosis not present

## 2018-06-24 DIAGNOSIS — Z7902 Long term (current) use of antithrombotics/antiplatelets: Secondary | ICD-10-CM | POA: Diagnosis not present

## 2018-07-09 DIAGNOSIS — R195 Other fecal abnormalities: Secondary | ICD-10-CM | POA: Diagnosis not present

## 2018-07-09 DIAGNOSIS — D508 Other iron deficiency anemias: Secondary | ICD-10-CM | POA: Diagnosis not present

## 2018-07-09 LAB — CBC
HCT: 42 % (ref 38.5–50.0)
Hemoglobin: 13.1 g/dL — ABNORMAL LOW (ref 13.2–17.1)
MCH: 25.5 pg — AB (ref 27.0–33.0)
MCHC: 31.2 g/dL — ABNORMAL LOW (ref 32.0–36.0)
MCV: 81.9 fL (ref 80.0–100.0)
MPV: 9.5 fL (ref 7.5–12.5)
PLATELETS: 268 10*3/uL (ref 140–400)
RBC: 5.13 10*6/uL (ref 4.20–5.80)
RDW: 19.4 % — ABNORMAL HIGH (ref 11.0–15.0)
WBC: 9.8 10*3/uL (ref 3.8–10.8)

## 2018-07-10 ENCOUNTER — Encounter (INDEPENDENT_AMBULATORY_CARE_PROVIDER_SITE_OTHER): Payer: Self-pay | Admitting: *Deleted

## 2018-07-10 ENCOUNTER — Other Ambulatory Visit (INDEPENDENT_AMBULATORY_CARE_PROVIDER_SITE_OTHER): Payer: Self-pay | Admitting: *Deleted

## 2018-07-10 DIAGNOSIS — D508 Other iron deficiency anemias: Secondary | ICD-10-CM

## 2018-07-10 DIAGNOSIS — R195 Other fecal abnormalities: Secondary | ICD-10-CM

## 2018-08-13 DIAGNOSIS — R195 Other fecal abnormalities: Secondary | ICD-10-CM | POA: Diagnosis not present

## 2018-08-13 DIAGNOSIS — D508 Other iron deficiency anemias: Secondary | ICD-10-CM | POA: Diagnosis not present

## 2018-08-13 LAB — CBC
HEMATOCRIT: 42.6 % (ref 38.5–50.0)
Hemoglobin: 13.8 g/dL (ref 13.2–17.1)
MCH: 27.1 pg (ref 27.0–33.0)
MCHC: 32.4 g/dL (ref 32.0–36.0)
MCV: 83.7 fL (ref 80.0–100.0)
MPV: 10 fL (ref 7.5–12.5)
Platelets: 270 10*3/uL (ref 140–400)
RBC: 5.09 10*6/uL (ref 4.20–5.80)
RDW: 15.8 % — ABNORMAL HIGH (ref 11.0–15.0)
WBC: 9.8 10*3/uL (ref 3.8–10.8)

## 2018-08-19 ENCOUNTER — Other Ambulatory Visit (INDEPENDENT_AMBULATORY_CARE_PROVIDER_SITE_OTHER): Payer: Self-pay | Admitting: *Deleted

## 2018-08-19 DIAGNOSIS — D508 Other iron deficiency anemias: Secondary | ICD-10-CM

## 2018-08-19 NOTE — Progress Notes (Signed)
HH

## 2018-09-27 DIAGNOSIS — Z0000 Encounter for general adult medical examination without abnormal findings: Secondary | ICD-10-CM | POA: Diagnosis not present

## 2018-10-02 ENCOUNTER — Other Ambulatory Visit (INDEPENDENT_AMBULATORY_CARE_PROVIDER_SITE_OTHER): Payer: Self-pay | Admitting: *Deleted

## 2018-10-02 ENCOUNTER — Encounter (INDEPENDENT_AMBULATORY_CARE_PROVIDER_SITE_OTHER): Payer: Self-pay | Admitting: *Deleted

## 2018-10-02 DIAGNOSIS — D508 Other iron deficiency anemias: Secondary | ICD-10-CM

## 2018-10-11 DIAGNOSIS — M25562 Pain in left knee: Secondary | ICD-10-CM | POA: Diagnosis not present

## 2018-10-21 DIAGNOSIS — D508 Other iron deficiency anemias: Secondary | ICD-10-CM | POA: Diagnosis not present

## 2018-10-21 LAB — HEMOGLOBIN AND HEMATOCRIT, BLOOD
HCT: 41.7 % (ref 38.5–50.0)
Hemoglobin: 13.8 g/dL (ref 13.2–17.1)

## 2018-10-22 ENCOUNTER — Other Ambulatory Visit (INDEPENDENT_AMBULATORY_CARE_PROVIDER_SITE_OTHER): Payer: Self-pay | Admitting: *Deleted

## 2018-10-22 DIAGNOSIS — D508 Other iron deficiency anemias: Secondary | ICD-10-CM

## 2018-10-31 DIAGNOSIS — L218 Other seborrheic dermatitis: Secondary | ICD-10-CM | POA: Diagnosis not present

## 2018-11-22 NOTE — Progress Notes (Signed)
Patient ID: [name], male   DOB: 08-29-1940, 78 y.o.   MRN: [id number]   77 y.o. CAD stent LAD patent by cath 2009 and normal myovue 2010. Activity limited by polio affecting legs and calf pain. No significant PVD by ABI's HTN and HLD well Rx. No angina or chest pain   Staying busy doing remodeling work   Labs with DR [name] Reviewed LDL 82 and normal LFTs  Chronically elevated WBC Seen by hematology and no evidence of lymphoma or leukemia Had EGD and colonoscopy 05/13/18 for heme positive stool and iron deficiency anemia Noted external Hemorrhoids and one small polyp removed Also had an area of gastric angiodysplasia Rx with clip/cautery  No cardiac complaints or chest pain   ROS: Denies fever, malais, weight loss, blurry vision, decreased visual acuity, cough, sputum, SOB, hemoptysis, pleuritic pain, palpitaitons, heartburn, abdominal pain, melena, lower extremity edema, claudication, or rash.  All other systems reviewed and negative  General: BP (!) 145/77   Pulse (!) 51   Temp 99 F (37.2 C)   Ht 5\' 8"  (1.727 m)   Wt 89.8 kg   SpO2 98%   BMI 30.11 kg/m   Affect appropriate Healthy:  appears stated age HEENT: normal Neck supple with no adenopathy JVP normal no bruits no thyromegaly Lungs clear with no wheezing and good diaphragmatic motion Heart:  S1/S2 no murmur, no rub, gallop or click PMI normal Abdomen: benighn, BS positve, no tenderness, no AAA no bruit.  No HSM or HJR Distal pulses intact with no bruits No edema Neuro non-focal Skin warm and dry Polio with atrophied right shoulder and arm  Post left TKR    Current Outpatient Medications  Medication Sig Dispense Refill  . aspirin 325 MG EC tablet Take 325 mg by mouth daily.    . clopidogrel (PLAVIX) 75 MG tablet Take 1 tablet (75 mg total) by mouth daily.    . ferrous sulfate 325 (65 FE) MG EC tablet Take 325 mg by mouth daily.     . nitroGLYCERIN (NITROSTAT) 0.4 MG SL tablet Place 1 tablet (0.4 mg  total) under the tongue every 5 (five) minutes as needed. (Patient taking differently: Place 0.4 mg under the tongue every 5 (five) minutes as needed for chest pain. ) 25 tablet 3  . omeprazole (PRILOSEC) 20 MG capsule Take 20 mg by mouth daily as needed (for acid reflux).   0  . simvastatin (ZOCOR) 40 MG tablet Take 40 mg by mouth at bedtime.      No current facility-administered medications for this visit.     Allergies  Patient has no known allergies.  Electrocardiogram:  12/04/13   SR RAD nonspecific ST changes  11/17/15  SR rate 63  RAD RVH  12/27/17 SR rate 68 RBBB RAD no changes   Assessment and Plan  CAD: Distant LAD stent Patent cath 2009 Normal myovue 2010  Stable with no angina and good activity level.  Continue medical Rx new nitro called in   HTN: Well controlled.  Continue current medications and low sodium Dash type diet.    Chol:   Cholesterol is at goal.  Labs with primary   Murmur: SEM AV sclerosis no need for echo as he is asymptomatic   Edema:  Dependant -Low dose diuretic PRN Low sodium diet .   Anemia:  See notes regarding Endo/Colon on 05/31/18  Continue iron and prilosec   [name]

## 2018-11-27 ENCOUNTER — Telehealth: Payer: Self-pay | Admitting: Cardiovascular Disease

## 2018-11-27 NOTE — Telephone Encounter (Signed)
° °  COVID-19 Pre-Screening Questions: ° °• Do you currently have a fever?NO ° ° °• Have you recently travelled on a cruise, internationally, or to [place], [place], [place], [place], [place], or [place], [place] ([employer]) ? NO °•  °• Have you been in contact with someone that is currently pending confirmation of Covid19 testing or has been confirmed to have the Covid19 virus?  NO °•  °Are you currently experiencing fatigue or cough? NO ° ° °   ° ° ° ° °

## 2018-11-28 ENCOUNTER — Other Ambulatory Visit: Payer: Self-pay

## 2018-11-28 ENCOUNTER — Encounter: Payer: Self-pay | Admitting: Cardiovascular Disease

## 2018-11-28 ENCOUNTER — Ambulatory Visit: Payer: Medicare HMO | Admitting: Cardiovascular Disease

## 2018-11-28 VITALS — BP 145/77 | HR 51 | Temp 99.0°F | Ht 68.0 in | Wt 198.0 lb

## 2018-11-28 DIAGNOSIS — I2510 Atherosclerotic heart disease of native coronary artery without angina pectoris: Secondary | ICD-10-CM | POA: Diagnosis not present

## 2018-11-28 NOTE — Patient Instructions (Signed)
Medication Instructions:  Your physician recommends that you continue on your current medications as directed. Please refer to the Current Medication list given to you today.   Labwork: NONEE  Testing/Procedures: NONE  Follow-Up: Your physician wants you to follow-up in: 6 MONTHS.  You will receive a reminder letter in the mail two months in advance. If you don't receive a letter, please call our office to schedule the follow-up appointment. 4  Any Other Special Instructions Will Be Listed Below (If Applicable).     If you need a refill on your cardiac medications before your next appointment, please call your pharmacy.

## 2019-01-03 ENCOUNTER — Other Ambulatory Visit: Payer: Self-pay | Admitting: Cardiovascular Disease

## 2019-01-06 ENCOUNTER — Other Ambulatory Visit (INDEPENDENT_AMBULATORY_CARE_PROVIDER_SITE_OTHER): Payer: Self-pay | Admitting: *Deleted

## 2019-01-06 DIAGNOSIS — D508 Other iron deficiency anemias: Secondary | ICD-10-CM

## 2019-01-13 MED ORDER — NITROGLYCERIN 0.4 MG SL SUBL: 0.4000 mg | SUBLINGUAL_TABLET | SUBLINGUAL | 3 refills | Status: DC | PRN

## 2019-01-13 NOTE — Telephone Encounter (Signed)
New message     *STAT* If patient is at the pharmacy, call can be transferred to refill team.   1. Which medications need to be refilled? (please list name of each medication and dose if known) nitro - his expired   2. Which pharmacy/location (including street and city if local pharmacy) is medication to be sent to? [facility]  3. Do they need a 30 day or 90 day supply? Only wants 10 to 15 , he hasnt used the last prescription it expired and turned to dust

## 2019-01-13 NOTE — Telephone Encounter (Signed)
refilled NTG 

## 2019-01-22 DIAGNOSIS — D508 Other iron deficiency anemias: Secondary | ICD-10-CM | POA: Diagnosis not present

## 2019-01-22 LAB — CBC
HCT: 44.8 % (ref 38.5–50.0)
Hemoglobin: 14.3 g/dL (ref 13.2–17.1)
MCH: 29.8 pg (ref 27.0–33.0)
MCHC: 31.9 g/dL — ABNORMAL LOW (ref 32.0–36.0)
MCV: 93.3 fL (ref 80.0–100.0)
MPV: 9.5 fL (ref 7.5–12.5)
Platelets: 270 10*3/uL (ref 140–400)
RBC: 4.8 10*6/uL (ref 4.20–5.80)
RDW: 13 % (ref 11.0–15.0)
WBC: 10.8 10*3/uL (ref 3.8–10.8)

## 2019-02-26 DIAGNOSIS — D649 Anemia, unspecified: Secondary | ICD-10-CM | POA: Diagnosis not present

## 2019-02-26 DIAGNOSIS — D509 Iron deficiency anemia, unspecified: Secondary | ICD-10-CM | POA: Diagnosis not present

## 2019-02-26 DIAGNOSIS — E782 Mixed hyperlipidemia: Secondary | ICD-10-CM | POA: Diagnosis not present

## 2019-02-26 DIAGNOSIS — E1169 Type 2 diabetes mellitus with other specified complication: Secondary | ICD-10-CM | POA: Diagnosis not present

## 2019-02-26 DIAGNOSIS — I10 Essential (primary) hypertension: Secondary | ICD-10-CM | POA: Diagnosis not present

## 2019-03-04 DIAGNOSIS — K219 Gastro-esophageal reflux disease without esophagitis: Secondary | ICD-10-CM | POA: Diagnosis not present

## 2019-03-04 DIAGNOSIS — R5383 Other fatigue: Secondary | ICD-10-CM | POA: Diagnosis not present

## 2019-03-04 DIAGNOSIS — E1169 Type 2 diabetes mellitus with other specified complication: Secondary | ICD-10-CM | POA: Diagnosis not present

## 2019-03-04 DIAGNOSIS — D72829 Elevated white blood cell count, unspecified: Secondary | ICD-10-CM | POA: Diagnosis not present

## 2019-03-04 DIAGNOSIS — J302 Other seasonal allergic rhinitis: Secondary | ICD-10-CM | POA: Diagnosis not present

## 2019-03-04 DIAGNOSIS — I10 Essential (primary) hypertension: Secondary | ICD-10-CM | POA: Diagnosis not present

## 2019-03-04 DIAGNOSIS — E782 Mixed hyperlipidemia: Secondary | ICD-10-CM | POA: Diagnosis not present

## 2019-03-04 DIAGNOSIS — D509 Iron deficiency anemia, unspecified: Secondary | ICD-10-CM | POA: Diagnosis not present

## 2019-03-04 DIAGNOSIS — B372 Candidiasis of skin and nail: Secondary | ICD-10-CM | POA: Diagnosis not present

## 2019-04-01 DIAGNOSIS — R310 Gross hematuria: Secondary | ICD-10-CM | POA: Diagnosis not present

## 2019-04-08 DIAGNOSIS — R69 Illness, unspecified: Secondary | ICD-10-CM | POA: Diagnosis not present

## 2019-04-29 DIAGNOSIS — R110 Nausea: Secondary | ICD-10-CM | POA: Diagnosis not present

## 2019-04-29 DIAGNOSIS — R140 Abdominal distension (gaseous): Secondary | ICD-10-CM | POA: Diagnosis not present

## 2019-05-03 ENCOUNTER — Other Ambulatory Visit: Payer: Self-pay | Admitting: Cardiovascular Disease

## 2019-06-10 NOTE — Progress Notes (Signed)
Patient ID: [name], male   DOB: 06/14/1940, 78 y.o.   MRN: [id number]   78 y.o. CAD stent LAD patent by cath 2009 and normal myovue 2010. Activity limited by polio affecting legs and calf pain. No significant PVD by ABI's HTN and HLD well Rx. No angina or chest pain   Staying busy doing remodeling work   Labs with DR Nevada Crane Reviewed LDL 82 and normal LFTs  Chronically elevated WBC Seen by hematology and no evidence of lymphoma or leukemia Had EGD and colonoscopy 05/13/18 for heme positive stool and iron deficiency anemia Noted external Hemorrhoids and one small polyp removed Also had an area of gastric angiodysplasia Rx with clip/cautery  No cardiac complaints or chest pain   ROS: Denies fever, malais, weight loss, blurry vision, decreased visual acuity, cough, sputum, SOB, hemoptysis, pleuritic pain, palpitaitons, heartburn, abdominal pain, melena, lower extremity edema, claudication, or rash.  All other systems reviewed and negative  General: BP 127/66   Pulse 75   Temp (!) 97.1 F (36.2 C) (Temporal)   Ht 5\' 10"  (1.778 m)   Wt 200 lb (90.7 kg)   SpO2 96%   BMI 28.70 kg/m   Affect appropriate Healthy:  appears stated age 78: normal Neck supple with no adenopathy JVP normal no bruits no thyromegaly Lungs clear with no wheezing and good diaphragmatic motion Heart:  S1/S2 no murmur, no rub, gallop or click PMI normal Abdomen: benighn, BS positve, no tenderness, no AAA no bruit.  No HSM or HJR Distal pulses intact with no bruits No edema Neuro non-focal Skin warm and dry Polio with atrophied right shoulder and arm  Post left TKR    Current Outpatient Medications  Medication Sig Dispense Refill  . aspirin 325 MG EC tablet Take 325 mg by mouth daily.    . clopidogrel (PLAVIX) 75 MG tablet Take 1 tablet by mouth once daily 90 tablet 1  . ferrous sulfate 325 (65 FE) MG EC tablet Take 325 mg by mouth daily.     . nitroGLYCERIN (NITROSTAT) 0.4 MG SL tablet Place 1  tablet (0.4 mg total) under the tongue every 5 (five) minutes as needed for chest pain. 25 tablet 3  . omeprazole (PRILOSEC) 20 MG capsule Take 20 mg by mouth daily as needed (for acid reflux).   0  . simvastatin (ZOCOR) 40 MG tablet Take 40 mg by mouth at bedtime.      No current facility-administered medications for this visit.    Allergies  Patient has no known allergies.  Electrocardiogram:  12/04/13   SR RAD nonspecific ST changes  11/17/15  SR rate 63  RAD RVH  12/27/17 SR rate 68 RBBB RAD no changes   Assessment and Plan  CAD: Distant LAD stent Patent cath 2009 Normal myovue 2010  He has had more exertional dyspnea and in general feeling Less well will order lexiscan myovue and echo to reassess heart   HTN: Well controlled.  Continue current medications and low sodium Dash type diet.    Chol:   Cholesterol is at goal.  Labs with primary   Murmur: SEM AV sclerosis ? See above echo ordered   Edema:  Dependant -Low dose diuretic HCTZ 12.5 mg  PRN Low sodium diet .   Anemia:  See notes regarding Endo/Colon on 05/31/18  Continue iron and prilosec   F/U in 6 months if echo and myvoue ok   [name]

## 2019-06-18 ENCOUNTER — Ambulatory Visit: Payer: Medicare HMO | Admitting: Cardiovascular Disease

## 2019-06-18 ENCOUNTER — Encounter: Payer: Self-pay | Admitting: Cardiovascular Disease

## 2019-06-18 ENCOUNTER — Other Ambulatory Visit: Payer: Self-pay

## 2019-06-18 ENCOUNTER — Encounter: Payer: Self-pay | Admitting: *Deleted

## 2019-06-18 VITALS — BP 127/66 | HR 75 | Temp 97.1°F | Ht 70.0 in | Wt 200.0 lb

## 2019-06-18 DIAGNOSIS — Z955 Presence of coronary angioplasty implant and graft: Secondary | ICD-10-CM | POA: Diagnosis not present

## 2019-06-18 DIAGNOSIS — R0600 Dyspnea, unspecified: Secondary | ICD-10-CM | POA: Diagnosis not present

## 2019-06-18 DIAGNOSIS — I10 Essential (primary) hypertension: Secondary | ICD-10-CM

## 2019-06-18 DIAGNOSIS — R0602 Shortness of breath: Secondary | ICD-10-CM | POA: Diagnosis not present

## 2019-06-18 DIAGNOSIS — R0609 Other forms of dyspnea: Secondary | ICD-10-CM

## 2019-06-18 MED ORDER — HYDROCHLOROTHIAZIDE 12.5 MG PO CAPS: 12.5000 mg | ORAL_CAPSULE | Freq: Every day | ORAL | 3 refills | Status: DC

## 2019-06-18 NOTE — Patient Instructions (Signed)
Medication Instructions:  Your physician recommends that you continue on your current medications as directed. Please refer to the Current Medication list given to you today.  Start HCTZ 12.5 mg Daily As Needed   *If you need a refill on your cardiac medications before your next appointment, please call your pharmacy*  Lab Work: NONE   If you have labs (blood work) drawn today and your tests are completely normal, you will receive your results only by: Marland Kitchen MyChart Message (if you have MyChart) OR . A paper copy in the mail If you have any lab test that is abnormal or we need to change your treatment, we will call you to review the results.  Testing/Procedures: Your physician has requested that you have a lexiscan myoview. For further information please visit [email or web address]. Please follow instruction sheet, as given.  Your physician has requested that you have an echocardiogram. Echocardiography is a painless test that uses sound waves to create images of your heart. It provides your doctor with information about the size and shape of your heart and how well your heart's chambers and valves are working. This procedure takes approximately one hour. There are no restrictions for this procedure.   Follow-Up: At [facility], you and your health needs are our priority.  As part of our continuing mission to provide you with exceptional heart care, we have created designated Provider Care Teams.  These Care Teams include your primary Cardiologist (physician) and Advanced Practice Providers (APPs -  Physician Assistants and Nurse Practitioners) who all work together to provide you with the care you need, when you need it.  Your next appointment:   6 month(s)  The format for your next appointment:   In Person  Provider:   [name], MD  Other Instructions Thank you for choosing [facility]!

## 2019-06-25 ENCOUNTER — Ambulatory Visit (HOSPITAL_COMMUNITY)
Admission: RE | Admit: 2019-06-25 | Discharge: 2019-06-25 | Disposition: A | Discharge status: Home / Self Care | Payer: Medicare HMO | Source: Ambulatory Visit | Attending: Cardiovascular Disease | Admitting: Cardiovascular Disease

## 2019-06-25 ENCOUNTER — Encounter (HOSPITAL_COMMUNITY): Payer: Medicare HMO

## 2019-06-25 ENCOUNTER — Ambulatory Visit (HOSPITAL_COMMUNITY): Payer: Medicare HMO

## 2019-06-25 DIAGNOSIS — R0602 Shortness of breath: Secondary | ICD-10-CM | POA: Diagnosis not present

## 2019-06-25 DIAGNOSIS — R0600 Dyspnea, unspecified: Secondary | ICD-10-CM | POA: Insufficient documentation

## 2019-06-25 DIAGNOSIS — H3563 Retinal hemorrhage, bilateral: Secondary | ICD-10-CM | POA: Diagnosis not present

## 2019-06-25 NOTE — Progress Notes (Signed)
*  PRELIMINARY RESULTS* Echocardiogram 2D Echocardiogram has been performed.  [name] 06/25/2019, 10:42 AM

## 2019-06-26 ENCOUNTER — Other Ambulatory Visit (HOSPITAL_COMMUNITY): Payer: Medicare HMO

## 2019-07-02 ENCOUNTER — Other Ambulatory Visit: Payer: Self-pay

## 2019-07-02 ENCOUNTER — Ambulatory Visit (HOSPITAL_COMMUNITY)
Admission: RE | Admit: 2019-07-02 | Discharge: 2019-07-02 | Disposition: A | Discharge status: Home / Self Care | Payer: Medicare HMO | Source: Ambulatory Visit | Attending: Cardiovascular Disease | Admitting: Cardiovascular Disease

## 2019-07-02 ENCOUNTER — Encounter (HOSPITAL_COMMUNITY)
Admission: RE | Admit: 2019-07-02 | Discharge: 2019-07-02 | Disposition: A | Discharge status: Home / Self Care | Payer: Medicare HMO | Source: Ambulatory Visit | Attending: Cardiovascular Disease | Admitting: Cardiovascular Disease

## 2019-07-02 DIAGNOSIS — R0602 Shortness of breath: Secondary | ICD-10-CM | POA: Diagnosis not present

## 2019-07-02 LAB — NM MYOCAR MULTI W/SPECT W/WALL MOTION / EF
LV dias vol: 128 mL (ref 62–150)
LV sys vol: 42 mL
Peak HR: 98 {beats}/min
RATE: 0.37
Rest HR: 65 {beats}/min
SDS: 1
SRS: 0
SSS: 1
TID: 1.14

## 2019-07-02 MED ORDER — REGADENOSON 0.4 MG/5ML IV SOLN
INTRAVENOUS | Status: AC
  Administered 2019-07-02: 0.4 mg via INTRAVENOUS
  Filled 2019-07-02: qty 5

## 2019-07-02 MED ORDER — TECHNETIUM TC 99M TETROFOSMIN IV KIT
30.0000 | PACK | Freq: Once | INTRAVENOUS | Status: AC | PRN
  Administered 2019-07-02: 33 via INTRAVENOUS

## 2019-07-02 MED ORDER — SODIUM CHLORIDE FLUSH 0.9 % IV SOLN
INTRAVENOUS | Status: AC
  Administered 2019-07-02: 10 mL via INTRAVENOUS
  Filled 2019-07-02: qty 10

## 2019-07-02 MED ORDER — TECHNETIUM TC 99M TETROFOSMIN IV KIT
10.0000 | PACK | Freq: Once | INTRAVENOUS | Status: AC | PRN
  Administered 2019-07-02: 11 via INTRAVENOUS

## 2019-07-03 ENCOUNTER — Ambulatory Visit: Payer: Medicare HMO | Attending: Internal Medicine

## 2019-07-03 DIAGNOSIS — Z23 Encounter for immunization: Secondary | ICD-10-CM | POA: Insufficient documentation

## 2019-07-03 NOTE — Progress Notes (Signed)
   Covid-19 Vaccination Clinic  Name:  CORDARRO SPEIER    MRN: [id number] DOB: 1941/05/05  07/03/2019  Mr. Crookshanks was observed post Covid-19 immunization for 15 minutes without incidence. He was provided with Vaccine Information Sheet and instruction to access the V-Safe system.   Mr. Gara was instructed to call 911 with any severe reactions post vaccine: Marland Kitchen Difficulty breathing  . Swelling of your face and throat  . A fast heartbeat  . A bad rash all over your body  . Dizziness and weakness    Immunizations Administered    Name Date Dose VIS Date Route   Pfizer COVID-19 Vaccine 07/03/2019  4:01 PM 0.3 mL 05/23/2019 Intramuscular   Manufacturer: [place]   Lot: [id number]   [place]: [id number]

## 2019-07-09 DIAGNOSIS — R140 Abdominal distension (gaseous): Secondary | ICD-10-CM | POA: Diagnosis not present

## 2019-07-09 DIAGNOSIS — Z87891 Personal history of nicotine dependence: Secondary | ICD-10-CM | POA: Diagnosis not present

## 2019-07-09 DIAGNOSIS — D649 Anemia, unspecified: Secondary | ICD-10-CM | POA: Diagnosis not present

## 2019-07-09 DIAGNOSIS — Z7902 Long term (current) use of antithrombotics/antiplatelets: Secondary | ICD-10-CM | POA: Diagnosis not present

## 2019-07-09 DIAGNOSIS — I10 Essential (primary) hypertension: Secondary | ICD-10-CM | POA: Diagnosis not present

## 2019-07-09 DIAGNOSIS — E782 Mixed hyperlipidemia: Secondary | ICD-10-CM | POA: Diagnosis not present

## 2019-07-09 DIAGNOSIS — D509 Iron deficiency anemia, unspecified: Secondary | ICD-10-CM | POA: Diagnosis not present

## 2019-07-09 DIAGNOSIS — Z9181 History of falling: Secondary | ICD-10-CM | POA: Diagnosis not present

## 2019-07-09 DIAGNOSIS — Z791 Long term (current) use of non-steroidal anti-inflammatories (NSAID): Secondary | ICD-10-CM | POA: Diagnosis not present

## 2019-07-09 DIAGNOSIS — K219 Gastro-esophageal reflux disease without esophagitis: Secondary | ICD-10-CM | POA: Diagnosis not present

## 2019-07-09 DIAGNOSIS — R7301 Impaired fasting glucose: Secondary | ICD-10-CM | POA: Diagnosis not present

## 2019-07-09 DIAGNOSIS — I2510 Atherosclerotic heart disease of native coronary artery without angina pectoris: Secondary | ICD-10-CM | POA: Diagnosis not present

## 2019-07-09 DIAGNOSIS — J309 Allergic rhinitis, unspecified: Secondary | ICD-10-CM | POA: Diagnosis not present

## 2019-07-09 DIAGNOSIS — R110 Nausea: Secondary | ICD-10-CM | POA: Diagnosis not present

## 2019-07-09 DIAGNOSIS — Z8249 Family history of ischemic heart disease and other diseases of the circulatory system: Secondary | ICD-10-CM | POA: Diagnosis not present

## 2019-07-09 DIAGNOSIS — Z7982 Long term (current) use of aspirin: Secondary | ICD-10-CM | POA: Diagnosis not present

## 2019-07-09 DIAGNOSIS — Z0000 Encounter for general adult medical examination without abnormal findings: Secondary | ICD-10-CM | POA: Diagnosis not present

## 2019-07-23 DIAGNOSIS — R140 Abdominal distension (gaseous): Secondary | ICD-10-CM | POA: Diagnosis not present

## 2019-07-23 DIAGNOSIS — B9681 Helicobacter pylori [H. pylori] as the cause of diseases classified elsewhere: Secondary | ICD-10-CM | POA: Diagnosis not present

## 2019-07-23 DIAGNOSIS — R110 Nausea: Secondary | ICD-10-CM | POA: Diagnosis not present

## 2019-07-24 ENCOUNTER — Ambulatory Visit: Payer: Medicare HMO | Attending: Internal Medicine

## 2019-07-24 DIAGNOSIS — Z23 Encounter for immunization: Secondary | ICD-10-CM | POA: Insufficient documentation

## 2019-07-24 NOTE — Progress Notes (Signed)
   Covid-19 Vaccination Clinic  Name:  [name]    MRN: [id number] DOB: [birth year]  07/24/2019  [name] was observed post Covid-19 immunization for 15 minutes without incidence. He was provided with Vaccine Information Sheet and instruction to access the V-Safe system.   [name] was instructed to call 911 with any severe reactions post vaccine: Marland Kitchen Difficulty breathing  . Swelling of your face and throat  . A fast heartbeat  . A bad rash all over your body  . Dizziness and weakness    Immunizations Administered    Name Date Dose VIS Date Route   Pfizer COVID-19 Vaccine 07/24/2019  3:19 PM 0.3 mL 05/23/2019 Intramuscular   Manufacturer: [place]   Lot: [id number]   [place]: [id number]

## 2019-08-06 ENCOUNTER — Encounter (INDEPENDENT_AMBULATORY_CARE_PROVIDER_SITE_OTHER): Payer: Self-pay | Admitting: Gastroenterology

## 2019-08-06 ENCOUNTER — Other Ambulatory Visit: Payer: Self-pay

## 2019-08-06 ENCOUNTER — Ambulatory Visit (INDEPENDENT_AMBULATORY_CARE_PROVIDER_SITE_OTHER): Payer: Medicare HMO | Admitting: Gastroenterology

## 2019-08-06 VITALS — BP 121/65 | HR 73 | Temp 98.0°F | Ht 66.0 in | Wt 199.9 lb

## 2019-08-06 DIAGNOSIS — R531 Weakness: Secondary | ICD-10-CM | POA: Diagnosis not present

## 2019-08-06 DIAGNOSIS — R110 Nausea: Secondary | ICD-10-CM

## 2019-08-06 DIAGNOSIS — Z8619 Personal history of other infectious and parasitic diseases: Secondary | ICD-10-CM

## 2019-08-06 MED ORDER — PANTOPRAZOLE SODIUM 40 MG PO TBEC: 40.0000 mg | DELAYED_RELEASE_TABLET | Freq: Two times a day (BID) | ORAL | 3 refills | Status: DC

## 2019-08-06 NOTE — Patient Instructions (Addendum)
Okay to stop antibiotics  We are starting medication to help if stomach ulcers or gastritis contributing - take protonix twice a day 30 min before meals.  Stop omeprazole 20mg  when starting protonix    We are checking labs   Limit ibuprofen products and take Aspirin 325mg  with food

## 2019-08-06 NOTE — Progress Notes (Signed)
Patient profile: [name] is a 79 y.o. male seen for evaluation of nausea . Last seen in clinic on   History of Present Illness: [name] is seen today for evaluation of nausea.  Wife accompanies and helps with history.  Reports approximately 2 months ago developing significant nausea, is worse after eating.  He is not having abdominal pain with the nausea.  Nausea seems to be occurring daily.  He is not vomiting.  He denies classic esophageal burning but does have a lot of burping.  Reports his appetite is good and he has not lost any weight with the symptoms.  He was seen at his PCP and diagnosed with H. pylori.  He was given 2 back-to-back 10-day courses of amoxicillin, clindamycin and Prilosec 20 mg.  He does not feel any symptom improvement with this.  He denies any lower GI complaints-and bowels are normal once a day.  Denies any constipation, diarrhea, melena, rectal bleeding.  No changes with bowel habits when symptoms began.  He takes ibuprofen 3-4 times a week, usually 400 mg.  He takes aspirin 325 daily on empty stomach.  He also takes Plavix daily.  Wt Readings from Last 3 Encounters:  08/06/19 199 lb 14.4 oz (90.7 kg)  06/18/19 200 lb (90.7 kg)  11/28/18 198 lb (89.8 kg)     Last Colonoscopy: 05/2018- Preparation of the colon was fair.  - One small polyp in the distal sigmoid colon,  removed with a cold snare. Resected and retrieved.  - External hemorrhoids   Last Endoscopy: 05/2018--- Normal esophagus. - Z-line regular, 41 cm from the incisors.  - A single bleeding angiodysplastic lesion in the stomach. . Treated with a heater probe but kept bleeding. Clips (MR conditional) were placed. Treated with argon plasma coagulation (APC). - Normal duodenal bulb and second portion of the duodenum.-  - No specimens collected   Past Medical History:  Past Medical History:  Diagnosis Date  . Coronary artery disease   . GERD (gastroesophageal reflux disease)   . HOH  (hard of hearing)   . Hyperlipidemia   . Hypertension   . Iron deficiency anemia 05/14/2018  . Polio    hx of. right arm atrophy    Problem List: Patient Active Problem List   Diagnosis Date Noted  . Absolute anemia 05/15/2018  . Guaiac positive stools 05/15/2018  . Iron deficiency anemia 05/14/2018  . Leukocytosis 10/09/2017  . Murmur 05/15/2014  . Polio 05/15/2014  . HTN (hypertension) 11/23/2010  . CLAUDICATION 05/19/2009  . Elevated lipids 09/17/2008  . CORONARY ARTERY ANEURYSM 09/17/2008    Past Surgical History: Past Surgical History:  Procedure Laterality Date  . CARDIAC CATHETERIZATION  07-09-2007  . CHOLECYSTECTOMY    . COLONOSCOPY WITH PROPOFOL N/A 05/31/2018   Procedure: COLONOSCOPY WITH PROPOFOL;  Surgeon: Rogene Houston, MD;  [facility];  Service: Endoscopy;  Laterality: N/A;  1:45  . CORONARY STENT PLACEMENT     x3 vessels bifurcated single vessel  . ESOPHAGOGASTRODUODENOSCOPY (EGD) WITH PROPOFOL N/A 05/31/2018   Procedure: ESOPHAGOGASTRODUODENOSCOPY (EGD) WITH PROPOFOL;  Surgeon: Rogene Houston, MD;  [facility];  Service: Endoscopy;  Laterality: N/A;  . KNEE ARTHROPLASTY Left    pinning surgery  . POLYPECTOMY  05/31/2018   Procedure: POLYPECTOMY;  Surgeon: Rogene Houston, MD;  [facility];  Service: Endoscopy;;  sigmoid    Allergies: No Known Allergies    Home Medications:  Current Outpatient Medications:  .  amoxicillin (AMOXIL) 500 MG capsule, Take 500 mg by mouth 2 (two) times daily., Disp: , Rfl:  .  aspirin 325 MG EC tablet, Take 325 mg by mouth daily., Disp: , Rfl:  .  Cholecalciferol (VITAMIN D3 PO), Take by mouth daily., Disp: , Rfl:  .  clindamycin (CLEOCIN) 300 MG capsule, Take 300 mg by mouth 2 (two) times daily., Disp: , Rfl:  .  clopidogrel (PLAVIX) 75 MG tablet, Take 1 tablet by mouth once daily, Disp: 90 tablet, Rfl: 1 .  ELDERBERRY PO, Take by mouth daily., Disp: , Rfl:  .  ferrous sulfate 325  (65 FE) MG EC tablet, Take 325 mg by mouth daily. , Disp: , Rfl:  .  hydrochlorothiazide (MICROZIDE) 12.5 MG capsule, Take 1 capsule (12.5 mg total) by mouth daily., Disp: 90 capsule, Rfl: 3 .  nitroGLYCERIN (NITROSTAT) 0.4 MG SL tablet, Place 1 tablet (0.4 mg total) under the tongue every 5 (five) minutes as needed for chest pain., Disp: 25 tablet, Rfl: 3 .  omeprazole (PRILOSEC) 20 MG capsule, Take 20 mg by mouth daily as needed (for acid reflux). , Disp: , Rfl: 0 .  simvastatin (ZOCOR) 40 MG tablet, Take 40 mg by mouth at bedtime. , Disp: , Rfl:  .  pantoprazole (PROTONIX) 40 MG tablet, Take 1 tablet (40 mg total) by mouth 2 (two) times daily. Take 30 min before meals, Disp: 60 tablet, Rfl: 3   Family History: family history includes Heart attack in his father; Heart attack (age of onset: 24) in his brother; Hypertension in his mother.    Social History:   reports that he quit smoking about 15 years ago. His smoking use included cigarettes. He has a 15.00 pack-year smoking history. He has never used smokeless tobacco. He reports that he does not drink alcohol or use drugs.   Review of Systems: Constitutional: Denies weight loss/weight gain  Eyes: No changes in vision. ENT: No oral lesions, sore throat.  GI: see HPI.  Heme/Lymph: No easy bruising.  CV: No chest pain.  GU: No hematuria.  Integumentary: No rashes.  Neuro: No headaches.  Psych: No depression/anxiety.  Endocrine: No heat/cold intolerance.  Allergic/Immunologic: No urticaria.  Resp: No cough, SOB.  Musculoskeletal: No joint swelling.    Physical Examination: BP 121/65 (BP Location: Left Arm, Patient Position: Sitting, Cuff Size: Large)   Pulse 73   Temp 98 F (36.7 C) (Temporal)   Ht 5\' 6"  (1.676 m)   Wt 199 lb 14.4 oz (90.7 kg)   BMI 32.26 kg/m  Gen: NAD, alert and oriented x 4 HEENT: PEERLA, EOMI, Neck: supple, no JVD Chest: CTA bilaterally, no wheezes, crackles, or other adventitious sounds CV: RRR, no  m/g/c/r Abd: soft, NT, ND, +BS in all four quadrants; no HSM, guarding, ridigity, or rebound tenderness Ext: no edema, well perfused with 2+ pulses, Skin: no rash or lesions noted on observed skin Lymph: no noted LAD  Data Reviewed:  Per PCP notes H pylori, gluten intolerance, alpha gal and CMP were drawn 07/09/2019-results not included.   Assessment/Plan: Mr. Amstutz is a 79 y.o. male   1.  History of H. Pylori-positive on serology drawn at PCP per patient.  He has now been on amoxicillin clindamycin and Prilosec 20 mg for 16 days, this should be adequate therapy after 10-14 days and will have him stop antibiotics as it may be aggravating some underlying gastritis.  He will either need EGD or H. pylori stool antigen testing in 6 weeks  for confirmatory eradication  2.  Nausea-suspect he has gastritis at baseline-has history of H. Pylori, also taking aspirin 325mg  daily and ibuprofen frequently on an empty stomach.  We discussed risk factor modifications.  We will check basic labs as below.  He has been on 20 mg once a day PPI with his H. pylori treatment, will transition him to twice daily PPI.  His wife also reports some weakness recently-check a CBC as below to exclude GI bleed  Patient likely will need endoscopy but will await lab results before scheduling. He denies prior issues w/ sedation.    [name] was seen today for follow-up.  Diagnoses and all orders for this visit:  Nausea without vomiting -     CBC with Differential -     COMPLETE METABOLIC PANEL WITH GFR -     Lipase  Weakness -     CBC with Differential -     COMPLETE METABOLIC PANEL WITH GFR -     Lipase  History of Helicobacter pylori infection -     CBC with Differential -     COMPLETE METABOLIC PANEL WITH GFR -     Lipase  Other orders -     pantoprazole (PROTONIX) 40 MG tablet; Take 1 tablet (40 mg total) by mouth 2 (two) times daily. Take 30 min before meals       I personally performed the service,  non-incident to. (WP)  Laurine Blazer, [facility]

## 2019-08-07 ENCOUNTER — Encounter (HOSPITAL_COMMUNITY): Payer: Self-pay | Admitting: Emergency Medicine

## 2019-08-07 ENCOUNTER — Observation Stay (HOSPITAL_COMMUNITY)
Admission: EM | Admit: 2019-08-07 | Discharge: 2019-08-08 | Disposition: A | Discharge status: Home / Self Care | Payer: Medicare HMO | Attending: Internal Medicine | Admitting: Internal Medicine

## 2019-08-07 ENCOUNTER — Other Ambulatory Visit: Payer: Self-pay

## 2019-08-07 ENCOUNTER — Telehealth (INDEPENDENT_AMBULATORY_CARE_PROVIDER_SITE_OTHER): Payer: Self-pay | Admitting: *Deleted

## 2019-08-07 DIAGNOSIS — Z87891 Personal history of nicotine dependence: Secondary | ICD-10-CM | POA: Insufficient documentation

## 2019-08-07 DIAGNOSIS — Z7902 Long term (current) use of antithrombotics/antiplatelets: Secondary | ICD-10-CM | POA: Insufficient documentation

## 2019-08-07 DIAGNOSIS — E785 Hyperlipidemia, unspecified: Secondary | ICD-10-CM | POA: Diagnosis not present

## 2019-08-07 DIAGNOSIS — I739 Peripheral vascular disease, unspecified: Secondary | ICD-10-CM | POA: Insufficient documentation

## 2019-08-07 DIAGNOSIS — Z79899 Other long term (current) drug therapy: Secondary | ICD-10-CM | POA: Insufficient documentation

## 2019-08-07 DIAGNOSIS — Z955 Presence of coronary angioplasty implant and graft: Secondary | ICD-10-CM | POA: Insufficient documentation

## 2019-08-07 DIAGNOSIS — D500 Iron deficiency anemia secondary to blood loss (chronic): Principal | ICD-10-CM | POA: Insufficient documentation

## 2019-08-07 DIAGNOSIS — Q2733 Arteriovenous malformation of digestive system vessel: Secondary | ICD-10-CM | POA: Insufficient documentation

## 2019-08-07 DIAGNOSIS — K219 Gastro-esophageal reflux disease without esophagitis: Secondary | ICD-10-CM | POA: Insufficient documentation

## 2019-08-07 DIAGNOSIS — R531 Weakness: Secondary | ICD-10-CM | POA: Diagnosis not present

## 2019-08-07 DIAGNOSIS — Z8249 Family history of ischemic heart disease and other diseases of the circulatory system: Secondary | ICD-10-CM | POA: Diagnosis not present

## 2019-08-07 DIAGNOSIS — D62 Acute posthemorrhagic anemia: Secondary | ICD-10-CM

## 2019-08-07 DIAGNOSIS — R110 Nausea: Secondary | ICD-10-CM | POA: Diagnosis not present

## 2019-08-07 DIAGNOSIS — D649 Anemia, unspecified: Secondary | ICD-10-CM | POA: Diagnosis not present

## 2019-08-07 DIAGNOSIS — Z7982 Long term (current) use of aspirin: Secondary | ICD-10-CM | POA: Diagnosis not present

## 2019-08-07 DIAGNOSIS — K3189 Other diseases of stomach and duodenum: Secondary | ICD-10-CM | POA: Insufficient documentation

## 2019-08-07 DIAGNOSIS — A809 Acute poliomyelitis, unspecified: Secondary | ICD-10-CM | POA: Diagnosis present

## 2019-08-07 DIAGNOSIS — D75839 Thrombocytosis, unspecified: Secondary | ICD-10-CM

## 2019-08-07 DIAGNOSIS — D473 Essential (hemorrhagic) thrombocythemia: Secondary | ICD-10-CM

## 2019-08-07 DIAGNOSIS — Z20822 Contact with and (suspected) exposure to covid-19: Secondary | ICD-10-CM | POA: Diagnosis not present

## 2019-08-07 DIAGNOSIS — I10 Essential (primary) hypertension: Secondary | ICD-10-CM | POA: Insufficient documentation

## 2019-08-07 DIAGNOSIS — D72829 Elevated white blood cell count, unspecified: Secondary | ICD-10-CM | POA: Diagnosis not present

## 2019-08-07 DIAGNOSIS — D7589 Other specified diseases of blood and blood-forming organs: Secondary | ICD-10-CM | POA: Diagnosis not present

## 2019-08-07 DIAGNOSIS — K449 Diaphragmatic hernia without obstruction or gangrene: Secondary | ICD-10-CM | POA: Diagnosis not present

## 2019-08-07 DIAGNOSIS — I2510 Atherosclerotic heart disease of native coronary artery without angina pectoris: Secondary | ICD-10-CM | POA: Diagnosis not present

## 2019-08-07 DIAGNOSIS — D509 Iron deficiency anemia, unspecified: Secondary | ICD-10-CM | POA: Diagnosis present

## 2019-08-07 LAB — COMPLETE METABOLIC PANEL WITH GFR
AG Ratio: 1.5 (calc) (ref 1.0–2.5)
ALT: 15 U/L (ref 9–46)
AST: 23 U/L (ref 10–35)
Albumin: 4.4 g/dL (ref 3.6–5.1)
Alkaline phosphatase (APISO): 64 U/L (ref 35–144)
BUN: 14 mg/dL (ref 7–25)
CO2: 30 mmol/L (ref 20–32)
Calcium: 9.5 mg/dL (ref 8.6–10.3)
Chloride: 101 mmol/L (ref 98–110)
Creat: 0.73 mg/dL (ref 0.70–1.18)
GFR, Est African American: 103 mL/min/{1.73_m2} (ref >=60)
GFR, Est Non African American: 89 mL/min/{1.73_m2} (ref >=60)
Globulin: 2.9 g/dL (calc) (ref 1.9–3.7)
Glucose, Bld: 100 mg/dL (ref 65–139)
Potassium: 4.5 mmol/L (ref 3.5–5.3)
Sodium: 138 mmol/L (ref 135–146)
Total Bilirubin: 0.4 mg/dL (ref 0.2–1.2)
Total Protein: 7.3 g/dL (ref 6.1–8.1)

## 2019-08-07 LAB — CBC WITH DIFFERENTIAL/PLATELET
Abs Immature Granulocytes: 0.09 10*3/uL — ABNORMAL HIGH (ref 0.00–0.07)
Absolute Monocytes: 1208 cells/uL — ABNORMAL HIGH (ref 200–950)
Basophils Absolute: 195 cells/uL (ref 0–200)
Basophils Absolute: 0.2 10*3/uL — ABNORMAL HIGH (ref 0.0–0.1)
Basophils Relative: 1.6 %
Basophils Relative: 1 %
Eosinophils Absolute: 1440 cells/uL — ABNORMAL HIGH (ref 15–500)
Eosinophils Absolute: 1.5 10*3/uL — ABNORMAL HIGH (ref 0.0–0.5)
Eosinophils Relative: 11.8 %
Eosinophils Relative: 12 %
HCT: 24.4 % — ABNORMAL LOW (ref 38.5–50.0)
HCT: 28.4 % — ABNORMAL LOW (ref 39.0–52.0)
Hemoglobin: 6.1 g/dL — ABNORMAL LOW (ref 13.2–17.1)
Hemoglobin: 6.7 g/dL — CL (ref 13.0–17.0)
Immature Granulocytes: 1 %
Lymphocytes Relative: 18 %
Lymphs Abs: 2538 cells/uL (ref 850–3900)
Lymphs Abs: 2.2 10*3/uL (ref 0.7–4.0)
MCH: 16.8 pg — ABNORMAL LOW (ref 27.0–33.0)
MCH: 17 pg — ABNORMAL LOW (ref 26.0–34.0)
MCHC: 25 g/dL — ABNORMAL LOW (ref 32.0–36.0)
MCHC: 23.6 g/dL — ABNORMAL LOW (ref 30.0–36.0)
MCV: 67 fL — ABNORMAL LOW (ref 80.0–100.0)
MCV: 72.1 fL — ABNORMAL LOW (ref 80.0–100.0)
MPV: 9.1 fL (ref 7.5–12.5)
Monocytes Absolute: 1.4 10*3/uL — ABNORMAL HIGH (ref 0.1–1.0)
Monocytes Relative: 9.9 %
Monocytes Relative: 11 %
Neutro Abs: 6820 cells/uL (ref 1500–7800)
Neutro Abs: 7.1 10*3/uL (ref 1.7–7.7)
Neutrophils Relative %: 55.9 %
Neutrophils Relative %: 57 %
Platelets: 624 10*3/uL — ABNORMAL HIGH (ref 140–400)
Platelets: 597 10*3/uL — ABNORMAL HIGH (ref 150–400)
RBC: 3.64 10*6/uL — ABNORMAL LOW (ref 4.20–5.80)
RBC: 3.94 MIL/uL — ABNORMAL LOW (ref 4.22–5.81)
RDW: 18.3 % — ABNORMAL HIGH (ref 11.0–15.0)
RDW: 20.7 % — ABNORMAL HIGH (ref 11.5–15.5)
Total Lymphocyte: 20.8 %
WBC: 12.2 10*3/uL — ABNORMAL HIGH (ref 3.8–10.8)
WBC: 12.3 10*3/uL — ABNORMAL HIGH (ref 4.0–10.5)
nRBC: 0.4 % — ABNORMAL HIGH (ref 0.0–0.2)

## 2019-08-07 LAB — HEMOGLOBIN AND HEMATOCRIT, BLOOD
HCT: 31.7 % — ABNORMAL LOW (ref 39.0–52.0)
Hemoglobin: 7.9 g/dL — ABNORMAL LOW (ref 13.0–17.0)

## 2019-08-07 LAB — BASIC METABOLIC PANEL
Anion gap: 9 (ref 5–15)
BUN: 14 mg/dL (ref 8–23)
CO2: 29 mmol/L (ref 22–32)
Calcium: 9.3 mg/dL (ref 8.9–10.3)
Chloride: 102 mmol/L (ref 98–111)
Creatinine, Ser: 0.6 mg/dL — ABNORMAL LOW (ref 0.61–1.24)
GFR calc Af Amer: >60 mL/min (ref >60)
GFR calc non Af Amer: >60 mL/min (ref >60)
Glucose, Bld: 98 mg/dL (ref 70–99)
Potassium: 4.2 mmol/L (ref 3.5–5.1)
Sodium: 140 mmol/L (ref 135–145)

## 2019-08-07 LAB — LIPASE: Lipase: 37 U/L (ref 7–60)

## 2019-08-07 LAB — IRON AND TIBC
Iron: 10 ug/dL — ABNORMAL LOW (ref 45–182)
Saturation Ratios: 2 % — ABNORMAL LOW (ref 17.9–39.5)
TIBC: 585 ug/dL — ABNORMAL HIGH (ref 250–450)
UIBC: 575 ug/dL

## 2019-08-07 LAB — SARS CORONAVIRUS 2 (TAT 6-24 HRS): SARS Coronavirus 2: NEGATIVE

## 2019-08-07 LAB — ABO/RH: ABO/RH(D): O POS

## 2019-08-07 LAB — PREPARE RBC (CROSSMATCH)

## 2019-08-07 LAB — FERRITIN: Ferritin: 3 ng/mL — ABNORMAL LOW (ref 24–336)

## 2019-08-07 LAB — CBC MORPHOLOGY

## 2019-08-07 MED ORDER — ONDANSETRON HCL 4 MG/2ML IJ SOLN: 4.0000 mg | Freq: Four times a day (QID) | INTRAMUSCULAR | Status: DC | PRN

## 2019-08-07 MED ORDER — PANTOPRAZOLE SODIUM 40 MG IV SOLR
40.0000 mg | Freq: Two times a day (BID) | INTRAVENOUS | Status: DC
  Administered 2019-08-08: 10:00:00 40 mg via INTRAVENOUS
  Filled 2019-08-07: qty 40

## 2019-08-07 MED ORDER — ONDANSETRON HCL 4 MG PO TABS: 4.0000 mg | ORAL_TABLET | Freq: Four times a day (QID) | ORAL | Status: DC | PRN

## 2019-08-07 MED ORDER — PANTOPRAZOLE SODIUM 40 MG IV SOLR
40.0000 mg | Freq: Once | INTRAVENOUS | Status: AC
  Administered 2019-08-07: 17:00:00 40 mg via INTRAVENOUS
  Filled 2019-08-07: qty 40

## 2019-08-07 MED ORDER — ACETAMINOPHEN 650 MG RE SUPP: 650.0000 mg | Freq: Four times a day (QID) | RECTAL | Status: DC | PRN

## 2019-08-07 MED ORDER — SODIUM CHLORIDE 0.9 % IV SOLN: 10.0000 mL/h | Freq: Once | INTRAVENOUS | Status: DC

## 2019-08-07 MED ORDER — ACETAMINOPHEN 325 MG PO TABS: 650.0000 mg | ORAL_TABLET | Freq: Four times a day (QID) | ORAL | Status: DC | PRN

## 2019-08-07 MED ORDER — POLYETHYLENE GLYCOL 3350 17 G PO PACK: 17.0000 g | PACK | Freq: Every day | ORAL | Status: DC | PRN

## 2019-08-07 NOTE — Telephone Encounter (Signed)
Rec'd a call from [facility] with a result of HGB 6.1.  Dr.Rehman was contacted. He recommended that the patient go directly to the ED for further evaluation, and he was going to call the ED physician to make aware.  Ask that the patient hold the Plavix and the ASA 325 mg today.  Patient was called and made aware. He states that he has taken these medications already this morning.  He and wife confirm that they are going to ED [facility]).

## 2019-08-07 NOTE — H&P (Signed)
History and Physical    [name] [id number] DOB: 1940/08/21 DOA: 08/07/2019  PCP: Celene Squibb, MD   Patient coming from: Home  I have personally briefly reviewed patient's old medical records in [place]  Chief Complaint: Low hemoglobin  HPI: [name] is a 79 y.o. male with medical history significant for arteriovenous malformations of the stomach, coronary artery disease, hypertension.  Patient had blood work done yesterday by his gastroenterologist and his hemoglobin was 6.1.  Was referred to the ED.  Patient is on aspirin and Plavix and took both medications this morning.  He takes Tylenol for pain, denies NSAID use.  He denies abdominal pain.  Denies black stools vomiting of blood or blood in stools. Reports progressive fatigue over the past 2 months.  No dizziness, no chest pain, no difficulty breathing.  ED Course: Blood pressure systolic [id number] to 0000000.  Hemoglobin 6.7.  2 units PRBC ordered for transfusion. EDP talked to gastroenterologist Dr. Laural Golden, who recommended admission, transfusion and likely patient will be scoped tomorrow.  Review of Systems: As per HPI all other systems reviewed and negative.  Past Medical History:  Diagnosis Date  . Coronary artery disease   . GERD (gastroesophageal reflux disease)   . HOH (hard of hearing)   . Hyperlipidemia   . Hypertension   . Iron deficiency anemia 05/14/2018  . Polio    hx of. right arm atrophy    Past Surgical History:  Procedure Laterality Date  . CARDIAC CATHETERIZATION  07-09-2007  . CHOLECYSTECTOMY    . COLONOSCOPY WITH PROPOFOL N/A 05/31/2018   Procedure: COLONOSCOPY WITH PROPOFOL;  Surgeon: Rogene Houston, MD;  [facility];  Service: Endoscopy;  Laterality: N/A;  1:45  . CORONARY STENT PLACEMENT     x3 vessels bifurcated single vessel  . ESOPHAGOGASTRODUODENOSCOPY (EGD) WITH PROPOFOL N/A 05/31/2018   Procedure: ESOPHAGOGASTRODUODENOSCOPY (EGD) WITH PROPOFOL;  Surgeon:  Rogene Houston, MD;  [facility];  Service: Endoscopy;  Laterality: N/A;  . KNEE ARTHROPLASTY Left    pinning surgery  . POLYPECTOMY  05/31/2018   Procedure: POLYPECTOMY;  Surgeon: Rogene Houston, MD;  [facility];  Service: Endoscopy;;  sigmoid     reports that he quit smoking about 15 years ago. His smoking use included cigarettes. He has a 15.00 pack-year smoking history. He has never used smokeless tobacco. He reports that he does not drink alcohol or use drugs.  No Known Allergies  Family History  Problem Relation Age of Onset  . Heart attack Brother 29       died  . Hypertension Mother   . Heart attack Father      Prior to Admission medications   Medication Sig Start Date End Date Taking? Authorizing Provider  clindamycin (CLEOCIN) 300 MG capsule Take 300 mg by mouth 2 (two) times daily. 07/29/19  Yes [provider]  clopidogrel (PLAVIX) 75 MG tablet Take 1 tablet by mouth once daily 05/05/19  Yes Josue Hector, MD  ELDERBERRY PO Take by mouth daily.   Yes [provider]  ferrous sulfate 325 (65 FE) MG EC tablet Take 325 mg by mouth daily.  05/08/18 08/07/19 Yes [provider]  hydrochlorothiazide (MICROZIDE) 12.5 MG capsule Take 1 capsule (12.5 mg total) by mouth daily. 06/18/19 09/16/19 Yes Josue Hector, MD  nitroGLYCERIN (NITROSTAT) 0.4 MG SL tablet Place 1 tablet (0.4 mg total) under the tongue every 5 (five) minutes as needed for  chest pain. 01/13/19  Yes Josue Hector, MD  pantoprazole (PROTONIX) 40 MG tablet Take 1 tablet (40 mg total) by mouth 2 (two) times daily. Take 30 min before meals Patient taking differently: Take 40 mg by mouth daily. Take 30 min before meals 08/06/19  Yes Minus Liberty, PA-C  simvastatin (ZOCOR) 40 MG tablet Take 40 mg by mouth at bedtime.  11/20/16  Yes [provider]  amoxicillin (AMOXIL) 500 MG capsule Take 500 mg by mouth 2 (two) times daily. 07/29/19   [provider]  aspirin 325 MG EC tablet Take 325 mg by mouth daily.    [provider]  Cholecalciferol (VITAMIN D3 PO) Take by mouth daily.    [provider]    Physical Exam: Vitals:   08/07/19 1523 08/07/19 1524 08/07/19 1526 08/07/19 1530  BP:  (!) 142/76  (!) 148/71  Pulse: 72   73  Resp:      Temp:   97.8 F (36.6 C)   TempSrc:   Oral   SpO2: 98%   95%  Weight:      Height:        Constitutional: NAD, calm, comfortable Vitals:   08/07/19 1523 08/07/19 1524 08/07/19 1526 08/07/19 1530  BP:  (!) 142/76  (!) 148/71  Pulse: 72   73  Resp:      Temp:   97.8 F (36.6 C)   TempSrc:   Oral   SpO2: 98%   95%  Weight:      Height:       Eyes: PERRL, ENMT: Mucous membranes are moist Neck: normal, supple, no masses, no thyromegaly Respiratory:  Normal respiratory effort. No accessory muscle use.  Cardiovascular: Regular rate and rhythm, No extremity edema. 2+ pedal pulses. Abdomen: no tenderness, no masses palpated. No hepatosplenomegaly. Bowel sounds positive.  Musculoskeletal: no clubbing / cyanosis.   Skin: no rashes, lesions, ulcers. No induration Neurologic:Right upper extremity affected by polio as a child, also left lower extremity weakness chronic from polio, 3+ /5 strength, no apparent cranial nerve abnormality, strength in left upper extremity and right lower extremity 5/5.   Psychiatric: Normal judgment and insight. Alert and oriented x 3. Normal mood.   Labs on Admission: I have personally reviewed following labs and imaging studies  CBC: Recent Labs  Lab 08/06/19 1254 08/07/19 1253  WBC 12.2* 12.3*  NEUTROABS 6,820 7.1  HGB 6.1* 6.7*  HCT 24.4* 28.4*  MCV 67.0* 72.1*  PLT 624* [id number]*   Basic Metabolic Panel: Recent Labs  Lab 08/06/19 1254 08/07/19 1253  NA 138 140  K 4.5 4.2  CL 101 102  CO2 30 29  GLUCOSE 100 98  BUN 14 14  CREATININE 0.73 0.60*  CALCIUM 9.5 9.3   Liver Function Tests: Recent Labs  Lab 08/06/19 1254  AST 23    ALT 15  BILITOT 0.4  PROT 7.3   Recent Labs  Lab 08/06/19 1254  LIPASE 37    EKG: None.  Assessment/Plan Principal Problem:   Acute anemia Active Problems:   HTN (hypertension)   Iron deficiency anemia   Coronary artery disease   Acute anemia with history of atrial venous malformations of the stomach-hemoglobin 6.7, hemoglobin was 14 6 months ago.  No active GI blood loss.  On Plavix and aspirin last dose this morning. -Clear liquid diet, n.p.o. midnight -Gastroenterology consulted possible endoscopy tomorrow -Transfuse 2 units PRBC ordered in ED -Monitor hemoglobin closely -Hold home aspirin and Plavix -IV Protonix  40 every 12 hourly -Prior to transfusion ferritin and iron studies show iron deficiency with ferritin - 3, serum iron - 10, iron saturation - 2, TIBC - 585.  Hypertension- 130s - 150s, takes HCTZ 12.5 as needed at home. -Monitor for now  Coronary artery disease history of stent placement-no chest pain.  Follows with Dr. Johnsie Cancel. -Hold aspirin and Plavix  Reactive thrombocytosis-platelets 597, likely from anemia.  Polio-affecting right upper extremity and left lower extremity   DVT prophylaxis: Scds Code Status: Full code Family Communication: None at bedside Disposition Plan: ~1 - 2 days, pending further evaluation with endoscopy, GI evaluation, stable hemoglobin Consults called: Gastroenterology Admission status: Observation, telemetry   [place] MD [facility]  08/07/2019, 5:22 PM

## 2019-08-07 NOTE — ED Provider Notes (Signed)
[facility] EMERGENCY DEPARTMENT Provider Note   CSN: [id number] Arrival date & time: 08/07/19  1131     History Chief Complaint  Patient presents with  . Abnormal Lab    [name] is a 79 y.o. male.  Patient presents with weakness.  Patient has a history of AVM in his stomach.  Patient was seen by GI yesterday and was told to come to the emergency department today when his hemoglobin was 6.1.  It was 14 in the fall  The history is provided by the patient. No language interpreter was used.  Weakness Severity:  Moderate Onset quality:  Sudden Timing:  Constant Progression:  Worsening Chronicity:  New Context: not alcohol use   Relieved by:  Nothing Worsened by:  Nothing Associated symptoms: no abdominal pain, no chest pain, no cough, no diarrhea, no frequency, no headaches and no seizures        Past Medical History:  Diagnosis Date  . Coronary artery disease   . GERD (gastroesophageal reflux disease)   . HOH (hard of hearing)   . Hyperlipidemia   . Hypertension   . Iron deficiency anemia 05/14/2018  . Polio    hx of. right arm atrophy    Patient Active Problem List   Diagnosis Date Noted  . Absolute anemia 05/15/2018  . Guaiac positive stools 05/15/2018  . Iron deficiency anemia 05/14/2018  . Leukocytosis 10/09/2017  . Murmur 05/15/2014  . Polio 05/15/2014  . HTN (hypertension) 11/23/2010  . CLAUDICATION 05/19/2009  . Elevated lipids 09/17/2008  . CORONARY ARTERY ANEURYSM 09/17/2008    Past Surgical History:  Procedure Laterality Date  . CARDIAC CATHETERIZATION  07-09-2007  . CHOLECYSTECTOMY    . COLONOSCOPY WITH PROPOFOL N/A 05/31/2018   Procedure: COLONOSCOPY WITH PROPOFOL;  Surgeon: Rogene Houston, MD;  Location: [facility];  Service: Endoscopy;  Laterality: N/A;  1:45  . CORONARY STENT PLACEMENT     x3 vessels bifurcated single vessel  . ESOPHAGOGASTRODUODENOSCOPY (EGD) WITH PROPOFOL N/A 05/31/2018   Procedure:  ESOPHAGOGASTRODUODENOSCOPY (EGD) WITH PROPOFOL;  Surgeon: Rogene Houston, MD;  Location: [facility];  Service: Endoscopy;  Laterality: N/A;  . KNEE ARTHROPLASTY Left    pinning surgery  . POLYPECTOMY  05/31/2018   Procedure: POLYPECTOMY;  Surgeon: Rogene Houston, MD;  Location: [facility];  Service: Endoscopy;;  sigmoid       Family History  Problem Relation Age of Onset  . Heart attack Brother 64       died  . Hypertension Mother   . Heart attack Father     Social History   Tobacco Use  . Smoking status: Former Smoker    Packs/day: 1.00    Years: 15.00    Pack years: 15.00    Types: Cigarettes    Quit date: 06/12/2004    Years since quitting: 15.1  . Smokeless tobacco: Never Used  Substance Use Topics  . Alcohol use: No    Alcohol/week: 0.0 standard drinks  . Drug use: No    Home Medications Prior to Admission medications   Medication Sig Start Date End Date Taking? Authorizing Provider  clindamycin (CLEOCIN) 300 MG capsule Take 300 mg by mouth 2 (two) times daily. 07/29/19  Yes [provider]  clopidogrel (PLAVIX) 75 MG tablet Take 1 tablet by mouth once daily 05/05/19  Yes Josue Hector, MD  ELDERBERRY PO Take by mouth daily.   Yes [provider]  ferrous sulfate 325 (65 FE) MG  EC tablet Take 325 mg by mouth daily.  05/08/18 08/07/19 Yes [provider]  hydrochlorothiazide (MICROZIDE) 12.5 MG capsule Take 1 capsule (12.5 mg total) by mouth daily. 06/18/19 09/16/19 Yes Josue Hector, MD  nitroGLYCERIN (NITROSTAT) 0.4 MG SL tablet Place 1 tablet (0.4 mg total) under the tongue every 5 (five) minutes as needed for chest pain. 01/13/19  Yes Josue Hector, MD  pantoprazole (PROTONIX) 40 MG tablet Take 1 tablet (40 mg total) by mouth 2 (two) times daily. Take 30 min before meals Patient taking differently: Take 40 mg by mouth daily. Take 30 min before meals 08/06/19  Yes Minus Liberty, PA-C  simvastatin (ZOCOR) 40 MG tablet Take 40 mg  by mouth at bedtime.  11/20/16  Yes [provider]  amoxicillin (AMOXIL) 500 MG capsule Take 500 mg by mouth 2 (two) times daily. 07/29/19   [provider]  aspirin 325 MG EC tablet Take 325 mg by mouth daily.    [provider]  Cholecalciferol (VITAMIN D3 PO) Take by mouth daily.    [provider]    Allergies    Patient has no known allergies.  Review of Systems   Review of Systems  Constitutional: Negative for appetite change and fatigue.  HENT: Negative for congestion, ear discharge and sinus pressure.   Eyes: Negative for discharge.  Respiratory: Negative for cough.   Cardiovascular: Negative for chest pain.  Gastrointestinal: Negative for abdominal pain and diarrhea.  Genitourinary: Negative for frequency and hematuria.  Musculoskeletal: Negative for back pain.  Skin: Negative for rash.  Neurological: Positive for weakness. Negative for seizures and headaches.  Psychiatric/Behavioral: Negative for hallucinations.    Physical Exam Updated Vital Signs BP (!) 143/67 (BP Location: Left Wrist)   Pulse 71   Temp 98.3 F (36.8 C) (Oral)   Resp 16   Ht 5\' 6"  (1.676 m)   Wt 90.3 kg   SpO2 98%   BMI 32.12 kg/m   Physical Exam Vitals and nursing note reviewed.  Constitutional:      Appearance: He is well-developed.  HENT:     Head: Normocephalic.     Nose: Nose normal.  Eyes:     General: No scleral icterus.    Conjunctiva/sclera: Conjunctivae normal.  Neck:     Thyroid: No thyromegaly.  Cardiovascular:     Rate and Rhythm: Normal rate and regular rhythm.     Heart sounds: No murmur. No friction rub. No gallop.   Pulmonary:     Breath sounds: No stridor. No wheezing or rales.  Chest:     Chest wall: No tenderness.  Abdominal:     General: There is no distension.     Tenderness: There is no abdominal tenderness. There is no rebound.  Musculoskeletal:        General: Normal range of motion.     Cervical back: Neck supple.    Lymphadenopathy:     Cervical: No cervical adenopathy.  Skin:    Findings: No erythema or rash.  Neurological:     Mental Status: He is alert and oriented to person, place, and time.     Motor: No abnormal muscle tone.     Coordination: Coordination normal.  Psychiatric:        Behavior: Behavior normal.     ED Results / Procedures / Treatments   Labs (all labs ordered are listed, but only abnormal results are displayed) Labs Reviewed  CBC WITH DIFFERENTIAL/PLATELET - Abnormal; Notable for the  following components:      Result Value   WBC 12.3 (*)    RBC 3.94 (*)    Hemoglobin 6.7 (*)    HCT 28.4 (*)    MCV 72.1 (*)    MCH 17.0 (*)    MCHC 23.6 (*)    RDW 20.7 (*)    Platelets 597 (*)    nRBC 0.4 (*)    Monocytes Absolute 1.4 (*)    Eosinophils Absolute 1.5 (*)    Basophils Absolute 0.2 (*)    Abs Immature Granulocytes 0.09 (*)    All other components within normal limits  BASIC METABOLIC PANEL - Abnormal; Notable for the following components:   Creatinine, Ser 0.60 (*)    All other components within normal limits  TYPE AND SCREEN  ABO/RH  PREPARE RBC (CROSSMATCH)    EKG None  Radiology No results found.  Procedures Procedures (including critical care time)  Medications Ordered in ED Medications  0.9 %  sodium chloride infusion (has no administration in time range)    ED Course  I have reviewed the triage vital signs and the nursing notes.  Pertinent labs & imaging results that were available during my care of the patient were reviewed by me and considered in my medical decision making (see chart for details).    CRITICAL CARE Performed by: Milton Ferguson Total critical care time: 40 minutes Critical care time was exclusive of separately billable procedures and treating other patients. Critical care was necessary to treat or prevent imminent or life-threatening deterioration. Critical care was time spent personally by me on the following activities:  development of treatment plan with patient and/or surrogate as well as nursing, discussions with consultants, evaluation of patient's response to treatment, examination of patient, obtaining history from patient or surrogate, ordering and performing treatments and interventions, ordering and review of laboratory studies, ordering and review of radiographic studies, pulse oximetry and re-evaluation of patient's condition.  MDM Rules/Calculators/A&P                      Patient with anemia.  He was seen by GI who felt like the patient needed to be admitted and they will probably scope him tomorrow. Final Clinical Impression(s) / ED Diagnoses Final diagnoses:  None    Rx / DC Orders ED Discharge Orders    None       Milton Ferguson, MD 08/07/19 1512

## 2019-08-07 NOTE — ED Triage Notes (Signed)
Patient sent by Dr. Olevia Perches office for low Hgb, 6.1.

## 2019-08-07 NOTE — ED Notes (Signed)
Date and time results received: 08/07/19 1337 (use smartphrase ".now" to insert current time)  Test: Hemoglobin Critical Value: 6.7  Name of Provider Notified: Dr Roderic Palau  Orders Received? Or Actions Taken?: Na

## 2019-08-08 ENCOUNTER — Observation Stay (HOSPITAL_COMMUNITY): Payer: Medicare HMO | Admitting: Anesthesiology

## 2019-08-08 ENCOUNTER — Encounter (HOSPITAL_COMMUNITY)
Admission: EM | Disposition: A | Discharge status: Home / Self Care | Payer: Self-pay | Source: Home / Self Care | Attending: Emergency Medicine

## 2019-08-08 ENCOUNTER — Encounter (HOSPITAL_COMMUNITY): Payer: Self-pay | Admitting: Internal Medicine

## 2019-08-08 DIAGNOSIS — D649 Anemia, unspecified: Secondary | ICD-10-CM | POA: Diagnosis not present

## 2019-08-08 DIAGNOSIS — R110 Nausea: Secondary | ICD-10-CM | POA: Diagnosis not present

## 2019-08-08 DIAGNOSIS — D7589 Other specified diseases of blood and blood-forming organs: Secondary | ICD-10-CM | POA: Diagnosis not present

## 2019-08-08 DIAGNOSIS — D500 Iron deficiency anemia secondary to blood loss (chronic): Secondary | ICD-10-CM

## 2019-08-08 DIAGNOSIS — Z20822 Contact with and (suspected) exposure to covid-19: Secondary | ICD-10-CM | POA: Diagnosis not present

## 2019-08-08 DIAGNOSIS — D509 Iron deficiency anemia, unspecified: Secondary | ICD-10-CM | POA: Diagnosis not present

## 2019-08-08 DIAGNOSIS — D72829 Elevated white blood cell count, unspecified: Secondary | ICD-10-CM | POA: Diagnosis not present

## 2019-08-08 DIAGNOSIS — D473 Essential (hemorrhagic) thrombocythemia: Secondary | ICD-10-CM | POA: Diagnosis not present

## 2019-08-08 DIAGNOSIS — D62 Acute posthemorrhagic anemia: Secondary | ICD-10-CM | POA: Diagnosis not present

## 2019-08-08 DIAGNOSIS — I10 Essential (primary) hypertension: Secondary | ICD-10-CM | POA: Diagnosis not present

## 2019-08-08 DIAGNOSIS — K449 Diaphragmatic hernia without obstruction or gangrene: Secondary | ICD-10-CM | POA: Diagnosis not present

## 2019-08-08 DIAGNOSIS — K3189 Other diseases of stomach and duodenum: Secondary | ICD-10-CM | POA: Diagnosis not present

## 2019-08-08 DIAGNOSIS — I2510 Atherosclerotic heart disease of native coronary artery without angina pectoris: Secondary | ICD-10-CM | POA: Diagnosis not present

## 2019-08-08 DIAGNOSIS — K219 Gastro-esophageal reflux disease without esophagitis: Secondary | ICD-10-CM | POA: Diagnosis not present

## 2019-08-08 DIAGNOSIS — Q2733 Arteriovenous malformation of digestive system vessel: Secondary | ICD-10-CM | POA: Diagnosis not present

## 2019-08-08 DIAGNOSIS — D75839 Thrombocytosis, unspecified: Secondary | ICD-10-CM

## 2019-08-08 DIAGNOSIS — K31819 Angiodysplasia of stomach and duodenum without bleeding: Secondary | ICD-10-CM | POA: Diagnosis not present

## 2019-08-08 HISTORY — PX: ESOPHAGOGASTRODUODENOSCOPY (EGD) WITH PROPOFOL: SHX5813

## 2019-08-08 LAB — TYPE AND SCREEN
ABO/RH(D): O POS
Antibody Screen: NEGATIVE
Unit division: 0
Unit division: 0

## 2019-08-08 LAB — BPAM RBC
Blood Product Expiration Date: 202103262359
Blood Product Expiration Date: 202103262359
ISSUE DATE / TIME: 202102251425
ISSUE DATE / TIME: 202102252101
Unit Type and Rh: 5100
Unit Type and Rh: 5100

## 2019-08-08 LAB — CBC
HCT: 33.5 % — ABNORMAL LOW (ref 39.0–52.0)
Hemoglobin: 8.7 g/dL — ABNORMAL LOW (ref 13.0–17.0)
MCH: 18.7 pg — ABNORMAL LOW (ref 26.0–34.0)
MCHC: 26 g/dL — ABNORMAL LOW (ref 30.0–36.0)
MCV: 72 fL — ABNORMAL LOW (ref 80.0–100.0)
Platelets: 570 10*3/uL — ABNORMAL HIGH (ref 150–400)
RBC: 4.65 MIL/uL (ref 4.22–5.81)
RDW: 21.5 % — ABNORMAL HIGH (ref 11.5–15.5)
WBC: 11.5 10*3/uL — ABNORMAL HIGH (ref 4.0–10.5)
nRBC: 0.2 % (ref 0.0–0.2)

## 2019-08-08 LAB — HEMOGLOBIN AND HEMATOCRIT, BLOOD
HCT: 30.5 % — ABNORMAL LOW (ref 39.0–52.0)
Hemoglobin: 7.8 g/dL — ABNORMAL LOW (ref 13.0–17.0)

## 2019-08-08 SURGERY — ESOPHAGOGASTRODUODENOSCOPY (EGD) WITH PROPOFOL
Anesthesia: General

## 2019-08-08 MED ORDER — PROPOFOL 10 MG/ML IV BOLUS
INTRAVENOUS | Status: AC
  Filled 2019-08-08: qty 20

## 2019-08-08 MED ORDER — KETAMINE HCL 10 MG/ML IJ SOLN
INTRAMUSCULAR | Status: DC | PRN
  Administered 2019-08-08: 25 mg via INTRAVENOUS

## 2019-08-08 MED ORDER — FERROUS SULFATE 325 (65 FE) MG PO TABS: 325.0000 mg | ORAL_TABLET | Freq: Two times a day (BID) | ORAL | 3 refills | Status: AC

## 2019-08-08 MED ORDER — DIPHENHYDRAMINE HCL 50 MG/ML IJ SOLN
INTRAMUSCULAR | Status: AC
  Filled 2019-08-08: qty 1

## 2019-08-08 MED ORDER — FERROUS SULFATE 325 (65 FE) MG PO TABS
325.0000 mg | ORAL_TABLET | Freq: Three times a day (TID) | ORAL | Status: DC
  Administered 2019-08-08: 325 mg via ORAL
  Filled 2019-08-08 (×2): qty 1

## 2019-08-08 MED ORDER — ASPIRIN EC 81 MG PO TBEC: 162.0000 mg | DELAYED_RELEASE_TABLET | Freq: Every day | ORAL | Status: DC

## 2019-08-08 MED ORDER — KETAMINE HCL 50 MG/5ML IJ SOSY
PREFILLED_SYRINGE | INTRAMUSCULAR | Status: AC
  Filled 2019-08-08: qty 5

## 2019-08-08 MED ORDER — PROPOFOL 500 MG/50ML IV EMUL
INTRAVENOUS | Status: DC | PRN
  Administered 2019-08-08: 125 ug/kg/min via INTRAVENOUS

## 2019-08-08 MED ORDER — HYDROMORPHONE HCL 1 MG/ML IJ SOLN: 0.2500 mg | INTRAMUSCULAR | Status: DC | PRN

## 2019-08-08 MED ORDER — PROMETHAZINE HCL 25 MG/ML IJ SOLN: 6.2500 mg | INTRAMUSCULAR | Status: DC | PRN

## 2019-08-08 MED ORDER — SODIUM CHLORIDE 0.9 % IV SOLN
510.0000 mg | Freq: Once | INTRAVENOUS | Status: AC
  Administered 2019-08-08: 510 mg via INTRAVENOUS
  Filled 2019-08-08 (×3): qty 17

## 2019-08-08 MED ORDER — HYDROCODONE-ACETAMINOPHEN 7.5-325 MG PO TABS: 1.0000 | ORAL_TABLET | Freq: Once | ORAL | Status: DC | PRN

## 2019-08-08 MED ORDER — LIDOCAINE HCL (CARDIAC) PF 100 MG/5ML IV SOSY
PREFILLED_SYRINGE | INTRAVENOUS | Status: DC | PRN
  Administered 2019-08-08: 80 mg via INTRAVENOUS

## 2019-08-08 MED ORDER — LACTATED RINGERS IV SOLN
INTRAVENOUS | Status: DC
  Administered 2019-08-08: 13:00:00 1000 mL via INTRAVENOUS

## 2019-08-08 MED ORDER — SODIUM CHLORIDE 0.9 % IV SOLN: INTRAVENOUS | Status: DC

## 2019-08-08 MED ORDER — MIDAZOLAM HCL 2 MG/2ML IJ SOLN: 0.5000 mg | Freq: Once | INTRAMUSCULAR | Status: DC | PRN

## 2019-08-08 MED ORDER — FERROUS SULFATE 325 (65 FE) MG PO TABS: 325.0000 mg | ORAL_TABLET | Freq: Three times a day (TID) | ORAL | 3 refills | Status: DC

## 2019-08-08 NOTE — Op Note (Signed)
[facility] Patient Name: [name] Procedure Date: 08/08/2019 12:57 PM MRN: [id number] Date of Birth: [birth year] Attending MD: Hildred Laser , MD CSN: [id number] Age: 79 Admit Type: Inpatient Procedure:                Upper GI endoscopy Indications:              Iron deficiency anemia, Nausea Providers:                Hildred Laser, MD, Otis Peak B. Sharon Seller, RN, Raphael Gibney, Technician Referring MD:             Delphina Cahill, MD Medicines:                Propofol per Anesthesia Complications:            No immediate complications. Estimated Blood Loss:     Estimated blood loss: none. Procedure:                Pre-Anesthesia Assessment:                           - Prior to the procedure, a History and Physical                            was performed, and patient medications and                            allergies were reviewed. The patient's tolerance of                            previous anesthesia was also reviewed. The risks                            and benefits of the procedure and the sedation                            options and risks were discussed with the patient.                            All questions were answered, and informed consent                            was obtained. Prior Anticoagulants: The patient                            last took Plavix (clopidogrel) 1 day prior to the                            procedure and has taken no previous anticoagulant                            or antiplatelet agents except for aspirin. ASA  Grade Assessment: III - A patient with severe                            systemic disease. After reviewing the risks and                            benefits, the patient was deemed in satisfactory                            condition to undergo the procedure.                           After obtaining informed consent, the endoscope was                            passed under  direct vision. Throughout the                            procedure, the patient's blood pressure, pulse, and                            oxygen saturations were monitored continuously. The                            [id number]) was introduced through the                            mouth, and advanced to the second part of duodenum.                            The upper GI endoscopy was accomplished without                            difficulty. The patient tolerated the procedure                            well. Scope In: 1:23:23 PM Scope Out: 1:28:38 PM Total Procedure Duration: 0 hours 5 minutes 15 seconds  Findings:      The examined esophagus was normal.      The Z-line was regular and was found 39 cm from the incisors.      A 2 cm hiatal hernia was present.      The entire examined stomach was normal.      The duodenal bulb was normal.      A single small angiodysplastic lesion without bleeding was found in the       second portion of the duodenum. Impression:               - Normal esophagus.                           - Z-line regular, 39 cm from the incisors.                           - 2 cm hiatal hernia.                           -  Normal stomach.                           - Normal duodenal bulb.                           - A single non-bleeding angiodysplastic lesion in                            the duodenum.                           - No specimens collected. Moderate Sedation:      Per Anesthesia Care Recommendation:           - Patient has a contact number available for                            emergencies. The signs and symptoms of potential                            delayed complications were discussed with the                            patient. Return to normal activities tomorrow.                            Written discharge instructions were provided to the                            patient.                           - Resume previous diet today.                            - Continue present medications.                           - Ferrous sulfate 325 mg po bid.                           - Consider reducing Aspirin dose if feasible.                           - Small bowel Given capsule study as outpatient. Procedure Code(s):        --- Professional ---                           [phone], Esophagogastroduodenoscopy, flexible,                            transoral; diagnostic, including collection of                            specimen(s) by brushing or washing, when performed                            (  separate procedure) Diagnosis Code(s):        --- Professional ---                           K44.9, Diaphragmatic hernia without obstruction or                            gangrene                           K31.819, Angiodysplasia of stomach and duodenum                            without bleeding                           D50.9, Iron deficiency anemia, unspecified                           R11.0, Nausea CPT copyright 2019 [employer]. All rights reserved. The codes documented in this report are preliminary and upon coder review may  be revised to meet current compliance requirements. Hildred Laser, MD Hildred Laser, MD 08/08/2019 1:38:28 PM This report has been signed electronically. Number of Addenda: 0

## 2019-08-08 NOTE — Transfer of Care (Signed)
Immediate Anesthesia Transfer of Care Note  Patient: Gwyndolyn Saxon A Dungee  Procedure(s) Performed: ESOPHAGOGASTRODUODENOSCOPY (EGD) WITH PROPOFOL (N/A )  Patient Location: PACU  Anesthesia Type:MAC  Level of Consciousness: awake, alert  and patient cooperative  Airway & Oxygen Therapy: Patient Spontanous Breathing  Post-op Assessment: Report given to RN and Post -op Vital signs reviewed and stable  Post vital signs: Reviewed and stable  Last Vitals:  Vitals Value Taken Time  BP    Temp    Pulse 99 08/08/19 1338  Resp 23 08/08/19 1338  SpO2 90 % 08/08/19 1338  Vitals shown include unvalidated device data.  Last Pain:  Vitals:   08/08/19 1316  TempSrc:   PainSc: 0-No pain      Patients Stated Pain Goal: 7 (77/03/40 3524)  Complications: No apparent anesthesia complications

## 2019-08-08 NOTE — Progress Notes (Signed)
Discussed AVS including medications, follow up lab work & need for follow up appointments with the patient and wife. All questioned fully answered. Patient taken with personal belongings to discharge area.

## 2019-08-08 NOTE — Anesthesia Postprocedure Evaluation (Signed)
Anesthesia Post Note  Patient: [name]  Procedure(s) Performed: ESOPHAGOGASTRODUODENOSCOPY (EGD) WITH PROPOFOL (N/A )  Patient location during evaluation: PACU Anesthesia Type: General and MAC Level of consciousness: awake and alert Pain management: pain level controlled Vital Signs Assessment: post-procedure vital signs reviewed and stable Respiratory status: spontaneous breathing Cardiovascular status: stable Postop Assessment: no apparent nausea or vomiting Anesthetic complications: no     Last Vitals:  Vitals:   08/08/19 0600 08/08/19 1257  BP: 128/60 (!) 146/64  Pulse: 66   Resp: 18 18  Temp: 36.4 C 37 C  SpO2: 96% 99%    Last Pain:  Vitals:   08/08/19 1316  TempSrc:   PainSc: 0-No pain                 Everette Rank

## 2019-08-08 NOTE — Consult Note (Addendum)
Referring Provider: Orson Eva, DO Primary Care Physician:  Celene Squibb, MD Primary Gastroenterologist:  Dr. Laural Golden  Reason for Consultation:    Iron deficiency anemia.  HPI:   Patient is 79 year old Caucasian male who who was evaluated for iron deficiency anemia back in December 2019 when he had esophagogastroduodenoscopy which revealed large gastric AV malformation with stigmata of bleed.  This lesion was treated with combination of coagulation and clip application.  Colonoscopy revealed a small polyp and left-sided diverticulosis.  Patient was maintained on iron.  And his hemoglobin returned to normal.  Patient presented to our office 2 days ago with 2 to 51-month history of postprandial nausea lasting for couple hours.  He would not throw up.  He would just eat slowly so that nausea would not be as pronounced.  He did not experience abdominal pain melena or rectal bleeding.  He was seen by Ms. Pablo Lawrence, [place] with Dr. Delphina Cahill.  He was noted to have positive serology for H. pylori gastritis.  He was begun on 2 antibiotics.  When he was seen in our office it was day 16.  Patient was advised to discontinue antibiotic.  Patient also had been complaining of progressive weakness and fatigue.  Therefore lab studies are obtained and his hemoglobin came back low at 6.7.  His MCV was 72.1.  I therefore advised patient to remove to emergency room.  He was evaluated in ER.  He has received 2 units of PRBCs and his hemoglobin is 8.7.  Patient states he has not been taking ferrous sulfate daily like he should. He has not lost any weight.  He is on clopidogrel along with full dose aspirin and he has been using Advil few times a week for headache or musculoskeletal pain. He denies chest pain or shortness of breath.   Past Medical History:  Diagnosis Date  . Coronary artery disease   . GERD (gastroesophageal reflux disease)   . HOH (hard of hearing)   . Hyperlipidemia   . Hypertension   . Iron  deficiency anemia 05/14/2018  . Polio    hx of. right arm atrophy    Past Surgical History:  Procedure Laterality Date  . CARDIAC CATHETERIZATION  07-09-2007  . CHOLECYSTECTOMY    . COLONOSCOPY WITH PROPOFOL N/A 05/31/2018   Procedure: COLONOSCOPY WITH PROPOFOL;  Surgeon: Rogene Houston, MD;  [facility];  Service: Endoscopy;  Laterality: N/A;  1:45  . CORONARY STENT PLACEMENT     x3 vessels bifurcated single vessel  . ESOPHAGOGASTRODUODENOSCOPY (EGD) WITH PROPOFOL N/A 05/31/2018   Procedure: ESOPHAGOGASTRODUODENOSCOPY (EGD) WITH PROPOFOL;  Surgeon: Rogene Houston, MD;  Location: [facility];  Service: Endoscopy;  Laterality: N/A;  . KNEE ARTHROPLASTY Left    pinning surgery  . POLYPECTOMY  05/31/2018   Procedure: POLYPECTOMY;  Surgeon: Rogene Houston, MD;  Location: [facility];  Service: Endoscopy;;  sigmoid    Prior to Admission medications   Medication Sig Start Date End Date Taking? Authorizing Provider  amoxicillin (AMOXIL) 500 MG capsule Take 500 mg by mouth 2 (two) times daily. 07/29/19  Yes [provider]  aspirin 325 MG EC tablet Take 325 mg by mouth daily.   Yes [provider]  Cholecalciferol (VITAMIN D3 PO) Take 1 capsule by mouth daily.    Yes [provider]  clindamycin (CLEOCIN) 300 MG capsule Take 300 mg by mouth 2 (two) times daily. 07/29/19  Yes [provider]  clopidogrel (PLAVIX) 75  MG tablet Take 1 tablet by mouth once daily 05/05/19  Yes Josue Hector, MD  ELDERBERRY PO Take by mouth daily.   Yes [provider]  ferrous sulfate 325 (65 FE) MG EC tablet Take 325 mg by mouth daily.  05/08/18 08/07/19 Yes [provider]  hydrochlorothiazide (MICROZIDE) 12.5 MG capsule Take 1 capsule (12.5 mg total) by mouth daily. 06/18/19 09/16/19 Yes Josue Hector, MD  nitroGLYCERIN (NITROSTAT) 0.4 MG SL tablet Place 1 tablet (0.4 mg total) under the tongue every 5 (five) minutes as needed for chest pain.  01/13/19  Yes Josue Hector, MD  pantoprazole (PROTONIX) 40 MG tablet Take 1 tablet (40 mg total) by mouth 2 (two) times daily. Take 30 min before meals Patient taking differently: Take 40 mg by mouth daily. Take 30 min before meals 08/06/19  Yes Minus Liberty, PA-C  simvastatin (ZOCOR) 40 MG tablet Take 40 mg by mouth at bedtime.  11/20/16  Yes [provider]    Current Facility-Administered Medications  Medication Dose Route Frequency Provider Last Rate Last Admin  . acetaminophen (TYLENOL) tablet 650 mg  650 mg Oral Q6H PRN Emokpae, Ejiroghene E, MD       Or  . acetaminophen (TYLENOL) suppository 650 mg  650 mg Rectal Q6H PRN Emokpae, Ejiroghene E, MD      . ondansetron (ZOFRAN) tablet 4 mg  4 mg Oral Q6H PRN Emokpae, Ejiroghene E, MD       Or  . ondansetron (ZOFRAN) injection 4 mg  4 mg Intravenous Q6H PRN Emokpae, Ejiroghene E, MD      . pantoprazole (PROTONIX) injection 40 mg  40 mg Intravenous Q12H Emokpae, Ejiroghene E, MD   40 mg at 08/08/19 0935  . polyethylene glycol (MIRALAX / GLYCOLAX) packet 17 g  17 g Oral Daily PRN Emokpae, Ejiroghene E, MD        Allergies as of 08/07/2019  . (No Known Allergies)    Family History  Problem Relation Age of Onset  . Heart attack Brother 57       died  . Hypertension Mother   . Heart attack Father     Social History   Socioeconomic History  . Marital status: Married    Spouse name: Not on file  . Number of children: 2  . Years of education: Not on file  . Highest education level: Not on file  Occupational History  . Occupation: retired Furniture conservator/restorer  Tobacco Use  . Smoking status: Former Smoker    Packs/day: 1.00    Years: 15.00    Pack years: 15.00    Types: Cigarettes    Quit date: 06/12/2004    Years since quitting: 15.1  . Smokeless tobacco: Never Used  Substance and Sexual Activity  . Alcohol use: No    Alcohol/week: 0.0 standard drinks  . Drug use: No  . Sexual activity: Yes    Birth control/protection:  None  Other Topics Concern  . Not on file  Social History Narrative  . Not on file   Social Determinants of Health   Financial Resource Strain:   . Difficulty of Paying Living Expenses: Not on file  Food Insecurity:   . Worried About Charity fundraiser in the Last Year: Not on file  . Ran Out of Food in the Last Year: Not on file  Transportation Needs:   . Lack of Transportation (Medical): Not on file  . Lack of Transportation (Non-Medical): Not on file  Physical Activity:   . Days of Exercise per Week: Not on file  . Minutes of Exercise per Session: Not on file  Stress:   . Feeling of Stress : Not on file  Social Connections:   . Frequency of Communication with Friends and Family: Not on file  . Frequency of Social Gatherings with Friends and Family: Not on file  . Attends Religious Services: Not on file  . Active Member of Clubs or Organizations: Not on file  . Attends Archivist Meetings: Not on file  . Marital Status: Not on file  Intimate Partner Violence:   . Fear of Current or Ex-Partner: Not on file  . Emotionally Abused: Not on file  . Physically Abused: Not on file  . Sexually Abused: Not on file    Review of Systems: See HPI, otherwise normal ROS  Physical Exam: Temp:  [97.6 F (36.4 C)-99.7 F (37.6 C)] 97.6 F (36.4 C) (02/26 0600) Pulse Rate:  [62-74] 66 (02/26 0600) Resp:  [16-20] 18 (02/26 0600) BP: (119-156)/(57-88) 128/60 (02/26 0600) SpO2:  [91 %-98 %] 96 % (02/26 0600) Weight:  [88.4 kg] 88.4 kg (02/25 1845) Last BM Date: 08/08/19  Patient was seen at bedside and is room. He is sitting at the edge of the bed. He is alert and in no acute distress. Conjunctiva is pale.  Sclerae nonicteric. Oropharyngeal mucosa is normal. Neck without masses or thyromegaly. He has kyphoscoliosis with curvature to the left side. Cardiac exam with regular rhythm normal S1 and S2.  He has soft systolic murmur best heard at left sternal  border. Auscultation lungs reveal vesicular breath sounds bilaterally. Abdomen is full.  Bowel sounds are normal.  On palpation is soft and nontender with organomegaly or masses. He does not have peripheral edema or clubbing. He has atrophy to right hand with no grip.   Lab Results: Recent Labs    08/06/19 1254 08/06/19 1254 08/07/19 1253 08/07/19 1253 08/07/19 1907 08/08/19 0156 08/08/19 0700  WBC 12.2*  --  12.3*  --   --   --  11.5*  HGB 6.1*   < > 6.7*   < > 7.9* 7.8* 8.7*  HCT 24.4*   < > 28.4*   < > 31.7* 30.5* 33.5*  PLT 624*  --  597*  --   --   --  570*   < > = values in this interval not displayed.   BMET Recent Labs    08/06/19 1254 08/07/19 1253  NA 138 140  K 4.5 4.2  CL 101 102  CO2 30 29  GLUCOSE 100 98  BUN 14 14  CREATININE 0.73 0.60*  CALCIUM 9.5 9.3   LFT Recent Labs    08/06/19 1254  PROT 7.3  AST 23  ALT 15  BILITOT 0.4    Assessment;  Patient is 79 year old Caucasian male who has history of iron deficiency anemia discovered to be due to large gastric AV malformation which was treated in December 2019.  With p.o. iron patient is hemoglobin returned to normal.  He now presents with 2 to 110-month history of postprandial nausea progressive weakness.  He recently completed therapy for H. pylori gastritis but nausea has not improved.  He had lab studies revealing profound anemia with hemoglobin of 6.7 g.  Iron studies confirm iron deficiency anemia.  No history of gross GI bleed.  I suspect he may have recurrent GI angiodysplasia.  He could also have peptic ulcer disease given postprandial nausea  and the fact that he is on clopidogrel full dose aspirin and on top of that he has been using OTC ibuprofen.  Patient has received 2 units of PRBCs and feels much better.  Hemoglobin has increased by 2 g from 6.7-8.7. Last colonoscopy was in December 2019. If no bleeding source is identified on EGD next step in his management would be small bowel given  capsule study which can be performed on an outpatient basis.  Recommendations;  Hemoccult stool to document that he is losing blood. Esophagogastroduodenoscopy with therapeutic and attention under monitored anesthesia care this afternoon.   LOS: 0 days   Krikor Willet  08/08/2019, 12:43 PM

## 2019-08-08 NOTE — Progress Notes (Signed)
   Vital Signs MEWS/VS Documentation      08/08/2019 0600 08/08/2019 0704 08/08/2019 1257 08/08/2019 1338   MEWS Score:  0  0  0  0   MEWS Score Color:  Green  Green  Green  Green   Resp:  18  --  18  --   Pulse:  66  --  --  --   BP:  128/60  --  (!) 146/64  --   Temp:  97.6 F (36.4 C)  --  98.6 F (37 C)  98.5 F (36.9 C)   O2 Device:  Room Air  --  Room [employer]   Level of Consciousness:  --  --  Alert  Alert           Trudee Kuster 08/08/2019,3:09 PM

## 2019-08-08 NOTE — Care Management Obs Status (Signed)
[facility] NOTIFICATION   Patient Details  Name: [name] MRN: [id number] Date of Birth: 09-Nov-1940   Medicare Observation Status Notification Given:  Yes    Tommy Medal 08/08/2019, 3:10 PM

## 2019-08-08 NOTE — Progress Notes (Signed)
Brief EGD note.  Normal mucosa of the esophagus. 2 cm sliding hiatal hernia Normal gastric mucosa.  No evidence of recurrent AV malformation. Normal bulbar mucosa. Small nonbleeding AV malformation in second part of duodenum.

## 2019-08-08 NOTE — Plan of Care (Signed)
  Problem: Education: Goal: Knowledge of General Education information will improve Description: Including pain rating scale, medication(s)/side effects and non-pharmacologic comfort measures Outcome: Adequate for Discharge   Problem: Health Behavior/Discharge Planning: Goal: Ability to manage health-related needs will improve Outcome: Adequate for Discharge   Problem: Clinical Measurements: Goal: Ability to maintain clinical measurements within normal limits will improve Outcome: Adequate for Discharge Goal: Will remain free from infection Outcome: Adequate for Discharge Goal: Diagnostic test results will improve Outcome: Adequate for Discharge Goal: Respiratory complications will improve Outcome: Adequate for Discharge Goal: Cardiovascular complication will be avoided Outcome: Adequate for Discharge   Problem: Activity: Goal: Risk for activity intolerance will decrease Outcome: Adequate for Discharge   Problem: Nutrition: Goal: Adequate nutrition will be maintained 08/08/2019 1716 by Trudee Kuster, RN Outcome: Adequate for Discharge 08/08/2019 0945 by Trudee Kuster, RN Outcome: Progressing   Problem: Coping: Goal: Level of anxiety will decrease Outcome: Adequate for Discharge   Problem: Elimination: Goal: Will not experience complications related to bowel motility 08/08/2019 1716 by Trudee Kuster, RN Outcome: Adequate for Discharge 08/08/2019 0945 by Trudee Kuster, RN Outcome: Progressing Goal: Will not experience complications related to urinary retention 08/08/2019 1716 by Trudee Kuster, RN Outcome: Adequate for Discharge 08/08/2019 0945 by Trudee Kuster, RN Outcome: Progressing   Problem: Pain Managment: Goal: General experience of comfort will improve 08/08/2019 1716 by Trudee Kuster, RN Outcome: Adequate for Discharge 08/08/2019 0945 by Trudee Kuster, RN Outcome: Progressing   Problem: Safety: Goal: Ability to remain free from injury will  improve Outcome: Adequate for Discharge   Problem: Skin Integrity: Goal: Risk for impaired skin integrity will decrease Outcome: Adequate for Discharge

## 2019-08-08 NOTE — Discharge Summary (Signed)
Physician Discharge Summary  [name] [id number] DOB: [birth year] DOA: 08/07/2019  PCP: Benjamin Squibb, MD  Admit date: 08/07/2019 Discharge date: 08/08/2019  Admitted From: Home Disposition:  Home   Recommendations for Outpatient Follow-up:  1. Follow up with PCP in 1-2 weeks 2. Please obtain BMP/CBC in one week    Discharge Condition: Stable CODE STATUS: FULL Diet recommendation: Heart Healthy   Brief/Interim Summary: 79 year old male with a history of gastric AVM, coronary disease, hypertension, hyperlipidemia presenting after contacted by his GI physician office secondary to low hemoglobin of 6.1.  The patient went to see his GI physician for evaluation of nausea for the past 2 months, worse after meals.  The patient was started on Protonix twice daily on 08/06/2019.  He denies any emesis, abdominal pain, dysphagia, odynophagia, diarrhea, hematochezia, melena.  He states that he has been taking ibuprofen 400 mg, 3 times per week. He states he takes about 6 tabs per week. He is also on aspirin and Plavix for his coronary artery disease.  He feels that he has been more fatigued for the past 2 months and has some dyspnea on exertion.  He denies any chest pain, cough, hemoptysis.  He went to see cardiology, [name] on 06/18/19, and he was told "it's not your heart".   In the emergency department, the patient was noted to have hemoglobin 6.7.  2 units PRBC were ordered.  The patient was afebrile hemodynamically stable with oxygen saturation 98% on room air.  WBC 12.3, platelets 597,000.  BMP was unremarkable.  Discharge Diagnoses:  Acute blood loss anemia/symptomatic anemia -Hemoglobin 14.3 on 01/22/2019 -concerned about UGI bleed from NSAIDS -05/31/2018 EGD--single bleeding angiodysplastic lesion in stomach treated with APC -05/31/2018 colonoscopy--distal sigmoid polyp, external hemorrhoids -Transfused 2 units PRBC -08/08/19 GI consult-->EGD-->A single small angiodysplastic  lesion without bleeding was found in the second portion of the duodenum. -Hgb 8.7 on day of d/c -continue protonix -08/08/19--discussed with [name] to d/c with outpt capsule study  Thrombocytosis -Secondary to iron deficiency anemia  Leukocytosis -This has been chronic -He has seen hematology in the past with negative work-up for hematologic malignancy  Iron deficiency anemia -feraheme x 1 IV -home with ferrous sulfate bid  Coronary artery disease -No chest pain presently -Distal LAD stent 2009; 2010 normal Myoview -Holding aspirin and Plavix temporarily-->resume ASA 162 mg with plavix 75  Essential hypertension -Holding HCTZ temporarily-->restart after d/c -BP controlled presently  Hyperlipidemia -Continue simvastatin    Discharge Instructions   Allergies as of 08/08/2019   No Known Allergies     Medication List    STOP taking these medications   amoxicillin 500 MG capsule Commonly known as: AMOXIL   clindamycin 300 MG capsule Commonly known as: CLEOCIN   ferrous sulfate 325 (65 FE) MG EC tablet Replaced by: ferrous sulfate 325 (65 FE) MG tablet     TAKE these medications   aspirin EC 81 MG tablet Take 2 tablets (162 mg total) by mouth daily. What changed:   medication strength  how much to take   clopidogrel 75 MG tablet Commonly known as: PLAVIX Take 1 tablet by mouth once daily   ELDERBERRY PO Take by mouth daily.   ferrous sulfate 325 (65 FE) MG tablet Take 1 tablet (325 mg total) by mouth 2 (two) times daily with a meal. Replaces: ferrous sulfate 325 (65 FE) MG EC tablet   hydrochlorothiazide 12.5 MG capsule Commonly known as: MICROZIDE Take 1 capsule (12.5 mg total) by mouth  daily.   nitroGLYCERIN 0.4 MG SL tablet Commonly known as: NITROSTAT Place 1 tablet (0.4 mg total) under the tongue every 5 (five) minutes as needed for chest pain.   pantoprazole 40 MG tablet Commonly known as: PROTONIX Take 1 tablet (40 mg  total) by mouth 2 (two) times daily. Take 30 min before meals What changed: when to take this   simvastatin 40 MG tablet Commonly known as: ZOCOR Take 40 mg by mouth at bedtime.   VITAMIN D3 PO Take 1 capsule by mouth daily.       No Known Allergies  Consultations:  GI--[name]   Procedures/Studies: No results found.      Discharge Exam: Vitals:   08/08/19 1257 08/08/19 1338  BP: (!) 146/64   Pulse:    Resp: 18   Temp: 98.6 F (37 C) 98.5 F (36.9 C)  SpO2: 99% 98%   Vitals:   08/07/19 2330 08/08/19 0600 08/08/19 1257 08/08/19 1338  BP: 129/67 128/60 (!) 146/64   Pulse: 66 66    Resp: 16 18 18    Temp: 98 F (36.7 C) 97.6 F (36.4 C) 98.6 F (37 C) 98.5 F (36.9 C)  TempSrc: Oral Oral Oral   SpO2: 98% 96% 99% 98%  Weight:      Height:        General: Pt is alert, awake, not in acute distress Cardiovascular: RRR, S1/S2 +, no rubs, no gallops Respiratory: diminished breath sounds.  Bibasilar rales. No wheeze Abdominal: Soft, NT, ND, bowel sounds + Extremities: no edema, no cyanosis   The results of significant diagnostics from this hospitalization (including imaging, microbiology, ancillary and laboratory) are listed below for reference.    Significant Diagnostic Studies: No results found.   Microbiology: Recent Results (from the past 240 hour(s))  SARS CORONAVIRUS 2 ([name] 6-24 HRS) Nasopharyngeal Nasopharyngeal Swab     Status: None   Collection Time: 08/07/19  5:20 PM   Specimen: Nasopharyngeal Swab  Result Value Ref Range Status   SARS Coronavirus 2 NEGATIVE NEGATIVE Final    Comment: (NOTE) SARS-CoV-2 target nucleic acids are NOT DETECTED. The SARS-CoV-2 RNA is generally detectable in upper and lower respiratory specimens during the acute phase of infection. Negative results do not preclude SARS-CoV-2 infection, do not rule out co-infections with other pathogens, and should not be used as the sole basis for treatment or other patient  management decisions. Negative results must be combined with clinical observations, patient history, and epidemiological information. The expected result is Negative. Fact Sheet for Patients: SugarRoll.be Fact Sheet for Healthcare Providers: https://www.woods-mathews.com/ This test is not yet approved or cleared by the [place] FDA and  has been authorized for detection and/or diagnosis of SARS-CoV-2 by FDA under an Emergency Use Authorization (EUA). This EUA will remain  in effect (meaning this test can be used) for the duration of the COVID-19 declaration under Section 56 4(b)(1) of the Act, 21 U.S.C. section 360bbb-3(b)(1), unless the authorization is terminated or revoked sooner. Performed at [facility], [place] [address] [place], [place] [address]      Labs: Basic Metabolic Panel: Recent Labs  Lab 08/06/19 1254 08/07/19 1253  NA 138 140  K 4.5 4.2  CL 101 102  CO2 30 29  GLUCOSE 100 98  BUN 14 14  CREATININE 0.73 0.60*  CALCIUM 9.5 9.3   Liver Function Tests: Recent Labs  Lab 08/06/19 1254  AST 23  ALT 15  BILITOT 0.4  PROT 7.3   Recent  Labs  Lab 08/06/19 1254  LIPASE 37   No results for input(s): AMMONIA in the last 168 hours. CBC: Recent Labs  Lab 08/06/19 1254 08/07/19 1253 08/07/19 1907 08/08/19 0156 08/08/19 0700  WBC 12.2* 12.3*  --   --  11.5*  NEUTROABS 6,820 7.1  --   --   --   HGB 6.1* 6.7* 7.9* 7.8* 8.7*  HCT 24.4* 28.4* 31.7* 30.5* 33.5*  MCV 67.0* 72.1*  --   --  72.0*  PLT 624* 597*  --   --  570*   Cardiac Enzymes: No results for input(s): CKTOTAL, CKMB, CKMBINDEX, TROPONINI in the last 168 hours. BNP: Invalid input(s): POCBNP CBG: No results for input(s): GLUCAP in the last 168 hours.  Time coordinating discharge:  36 minutes  Signed:  Orson Eva, DO [facility] Pager: [phone] 08/08/2019, 4:33 PM

## 2019-08-08 NOTE — Progress Notes (Signed)
PROGRESS NOTE  [name] [id number] DOB: 24-Oct-1940 DOA: 08/07/2019 PCP: Celene Squibb, MD  Brief History:  79 year old male with a history of gastric AVM, coronary disease, hypertension, hyperlipidemia presenting after contacted by his GI physician office secondary to low hemoglobin of 6.1.  The patient went to see his GI physician for evaluation of nausea for the past 2 months, worse after meals.  The patient was started on Protonix twice daily on 08/06/2019.  He denies any emesis, abdominal pain, dysphagia, odynophagia, diarrhea, hematochezia, melena.  He states that he has been taking ibuprofen 400 mg, 3 times per week. He states he takes about 6 tabs per week. He is also on aspirin and Plavix for his coronary artery disease.  He feels that he has been more fatigued for the past 2 months and has some dyspnea on exertion.  He denies any chest pain, cough, hemoptysis.  He went to see cardiology, Dr. Johnsie Cancel on 06/18/19, and he was told "it's not your heart".   In the emergency department, the patient was noted to have hemoglobin 6.7.  2 units PRBC were ordered.  The patient was afebrile hemodynamically stable with oxygen saturation 98% on room air.  WBC 12.3, platelets 597,000.  BMP was unremarkable.  Assessment/Plan: Acute blood loss anemia/symptomatic anemia -Hemoglobin 14.3 on 01/22/2019 -concerned about UGI bleed from NSAIDS -05/31/2018 EGD--single bleeding angiodysplastic lesion in stomach treated with APC -05/31/2018 colonoscopy--distal sigmoid polyp, external hemorrhoids -Transfused 2 units PRBC -GI consult -continue protonix IV bid  Thrombocytosis -Secondary to iron deficiency anemia  Leukocytosis -This has been chronic -He has seen hematology in the past with negative work-up for hematologic malignancy  Iron deficiency anemia -Transfuse Feraheme and start ferrous sulfate once GI work-up is finished  Coronary artery disease -No chest pain presently -Distal LAD  stent 2009; 2010 normal Myoview -Holding aspirin and Plavix temporarily  Essential hypertension -Holding HCTZ temporarily -BP controlled presently  Hyperlipidemia -Continue simvastatin       Disposition Plan: Patient From: Home D/C Place: Home - 1-2  Days Barriers: Not Clinically Stable--GI workup--endoscopy planned  Family Communication:   Family at bedside  Consultants:  GI  Code Status:  FULL  DVT Prophylaxis:  SCDs   Procedures: As Listed in Progress Note Above  Antibiotics: None       Subjective: Patient denies fevers, chills, headache, chest pain, dyspnea, nausea, vomiting, diarrhea, abdominal pain, dysuria, hematuria, hematochezia, and melena.   Objective: Vitals:   08/07/19 2145 08/07/19 2200 08/07/19 2330 08/08/19 0600  BP: 140/88 (!) 141/87 129/67 128/60  Pulse: 70 62 66 66  Resp: 18 18 16 18   Temp: 98.1 F (36.7 C) 97.8 F (36.6 C) 98 F (36.7 C) 97.6 F (36.4 C)  TempSrc: Oral Oral Oral Oral  SpO2: 98% 96% 98% 96%  Weight:      Height:        Intake/Output Summary (Last 24 hours) at 08/08/2019 0806 Last data filed at 08/08/2019 0300 Gross per 24 hour  Intake 315 ml  Output 1420 ml  Net -1105 ml   Weight change:  Exam:   General:  Pt is alert, follows commands appropriately, not in acute distress  HEENT: No icterus, No thrush, No neck mass, [place]/AT  Cardiovascular: RRR, S1/S2, no rubs, no gallops  Respiratory: bibasilar rales. Diminished breath sounds. No wheeze  Abdomen: Soft/+BS, non tender, non distended, no guarding  Extremities: No edema, No lymphangitis, No petechiae, No rashes, no  synovitis   Data Reviewed: I have personally reviewed following labs and imaging studies Basic Metabolic Panel: Recent Labs  Lab 08/06/19 1254 08/07/19 1253  NA 138 140  K 4.5 4.2  CL 101 102  CO2 30 29  GLUCOSE 100 98  BUN 14 14  CREATININE 0.73 0.60*  CALCIUM 9.5 9.3   Liver Function Tests: Recent Labs  Lab 08/06/19 1254    AST 23  ALT 15  BILITOT 0.4  PROT 7.3   Recent Labs  Lab 08/06/19 1254  LIPASE 37   No results for input(s): AMMONIA in the last 168 hours. Coagulation Profile: No results for input(s): INR, PROTIME in the last 168 hours. CBC: Recent Labs  Lab 08/06/19 1254 08/07/19 1253 08/07/19 1907 08/08/19 0156 08/08/19 0700  WBC 12.2* 12.3*  --   --  11.5*  NEUTROABS 6,820 7.1  --   --   --   HGB 6.1* 6.7* 7.9* 7.8* 8.7*  HCT 24.4* 28.4* 31.7* 30.5* 33.5*  MCV 67.0* 72.1*  --   --  72.0*  PLT 624* 597*  --   --  570*   Cardiac Enzymes: No results for input(s): CKTOTAL, CKMB, CKMBINDEX, TROPONINI in the last 168 hours. BNP: Invalid input(s): POCBNP CBG: No results for input(s): GLUCAP in the last 168 hours. HbA1C: No results for input(s): HGBA1C in the last 72 hours. Urine analysis: No results found for: COLORURINE, APPEARANCEUR, LABSPEC, PHURINE, GLUCOSEU, HGBUR, BILIRUBINUR, KETONESUR, PROTEINUR, UROBILINOGEN, NITRITE, LEUKOCYTESUR Sepsis Labs: @LABRCNTIP (procalcitonin:4,lacticidven:4) ) Recent Results (from the past 240 hour(s))  SARS CORONAVIRUS 2 (Velia Pamer 6-24 HRS) Nasopharyngeal Nasopharyngeal Swab     Status: None   Collection Time: 08/07/19  5:20 PM   Specimen: Nasopharyngeal Swab  Result Value Ref Range Status   SARS Coronavirus 2 NEGATIVE NEGATIVE Final    Comment: (NOTE) SARS-CoV-2 target nucleic acids are NOT DETECTED. The SARS-CoV-2 RNA is generally detectable in upper and lower respiratory specimens during the acute phase of infection. Negative results do not preclude SARS-CoV-2 infection, do not rule out co-infections with other pathogens, and should not be used as the sole basis for treatment or other patient management decisions. Negative results must be combined with clinical observations, patient history, and epidemiological information. The expected result is Negative. Fact Sheet for Patients: SugarRoll.be Fact Sheet for  Healthcare Providers: https://www.woods-mathews.com/ This test is not yet approved or cleared by the [place] FDA and  has been authorized for detection and/or diagnosis of SARS-CoV-2 by FDA under an Emergency Use Authorization (EUA). This EUA will remain  in effect (meaning this test can be used) for the duration of the COVID-19 declaration under Section 56 4(b)(1) of the Act, 21 U.S.C. section 360bbb-3(b)(1), unless the authorization is terminated or revoked sooner. Performed at [facility], [place] [address] [place], [place] [address]      Scheduled Meds: . pantoprazole (PROTONIX) IV  40 mg Intravenous Q12H   Continuous Infusions:  Procedures/Studies: No results found.  Orson Eva, DO  [facility]  If 7PM-7AM, please contact night-coverage [email or web address] Password [email or web address] 08/08/2019, 8:06 AM   LOS: 0 days

## 2019-08-08 NOTE — Anesthesia Preprocedure Evaluation (Signed)
Anesthesia Evaluation  Patient identified by MRN, date of birth, ID band Patient awake    Reviewed: Allergy & Precautions, NPO status , Patient's Chart, lab work & pertinent test results  Airway Mallampati: II  TM Distance: >3 FB Neck ROM: Full    Dental no notable dental hx. (+) Missing, Chipped   Pulmonary neg pulmonary ROS, former smoker,    Pulmonary exam normal breath sounds clear to auscultation       Cardiovascular Exercise Tolerance: Good hypertension, Pt. on medications + CAD and + Peripheral Vascular Disease  Normal cardiovascular examI+ Valvular Problems/Murmurs AS  Rhythm:Regular Rate:Normal  Reports Good ET S/p recent Cardiac eval-  2 remote stents  Denies recent CP   Neuro/Psych H/o polio negative psych ROS   GI/Hepatic Neg liver ROS, GERD  Medicated and Controlled,  Endo/Other  negative endocrine ROS  Renal/GU negative Renal ROS  negative genitourinary   Musculoskeletal negative musculoskeletal ROS (+)   Abdominal   Peds negative pediatric ROS (+)  Hematology negative hematology ROS (+) anemia , Given 2 units  Last h/h 8.7/33.5   Anesthesia Other Findings   Reproductive/Obstetrics negative OB ROS                             Anesthesia Physical Anesthesia Plan  ASA: III  Anesthesia Plan: General   Post-op Pain Management:    Induction: Intravenous  PONV Risk Score and Plan: 2 and Propofol infusion, TIVA and Treatment may vary due to age or medical condition  Airway Management Planned: Nasal Cannula and Simple Face Mask  Additional Equipment:   Intra-op Plan:   Post-operative Plan:   Informed Consent: I have reviewed the patients History and Physical, chart, labs and discussed the procedure including the risks, benefits and alternatives for the proposed anesthesia with the patient or authorized representative who has indicated his/her understanding and  acceptance.     Dental advisory given  Plan Discussed with: CRNA  Anesthesia Plan Comments: (Plan Full PPE use  Plan GA with GETA as needed d/w pt -WTP with same after Q&A)        Anesthesia Quick Evaluation

## 2019-08-08 NOTE — Plan of Care (Signed)
  Problem: Nutrition: Goal: Adequate nutrition will be maintained Outcome: Progressing   Problem: Elimination: Goal: Will not experience complications related to bowel motility Outcome: Progressing   Problem: Elimination: Goal: Will not experience complications related to urinary retention Outcome: Progressing   Problem: Pain Managment: Goal: General experience of comfort will improve Outcome: Progressing

## 2019-08-11 NOTE — Addendum Note (Signed)
Addendum  created 08/11/19 1454 by Ollen Bowl, CRNA   Charge Capture section accepted

## 2019-08-11 NOTE — Addendum Note (Signed)
Addendum  created 08/11/19 1452 by Ollen Bowl, CRNA   Flowsheet accepted, Intraprocedure Flowsheets edited

## 2019-08-19 ENCOUNTER — Other Ambulatory Visit (INDEPENDENT_AMBULATORY_CARE_PROVIDER_SITE_OTHER): Payer: Self-pay | Admitting: *Deleted

## 2019-08-19 DIAGNOSIS — D509 Iron deficiency anemia, unspecified: Secondary | ICD-10-CM | POA: Diagnosis not present

## 2019-08-19 DIAGNOSIS — R5383 Other fatigue: Secondary | ICD-10-CM | POA: Diagnosis not present

## 2019-08-19 DIAGNOSIS — I10 Essential (primary) hypertension: Secondary | ICD-10-CM | POA: Diagnosis not present

## 2019-08-19 DIAGNOSIS — K922 Gastrointestinal hemorrhage, unspecified: Secondary | ICD-10-CM | POA: Diagnosis not present

## 2019-08-19 DIAGNOSIS — Z8619 Personal history of other infectious and parasitic diseases: Secondary | ICD-10-CM

## 2019-08-19 DIAGNOSIS — K219 Gastro-esophageal reflux disease without esophagitis: Secondary | ICD-10-CM | POA: Diagnosis not present

## 2019-08-19 LAB — CBC
HCT: 37.9 % — ABNORMAL LOW (ref 38.5–50.0)
Hemoglobin: 10.9 g/dL — ABNORMAL LOW (ref 13.2–17.1)
MCH: 22.2 pg — ABNORMAL LOW (ref 27.0–33.0)
MCHC: 28.8 g/dL — ABNORMAL LOW (ref 32.0–36.0)
MCV: 77.2 fL — ABNORMAL LOW (ref 80.0–100.0)
MPV: 9.3 fL (ref 7.5–12.5)
Platelets: 364 10*3/uL (ref 140–400)
RBC: 4.91 10*6/uL (ref 4.20–5.80)
RDW: 27.7 % — ABNORMAL HIGH (ref 11.0–15.0)
WBC: 11.5 10*3/uL — ABNORMAL HIGH (ref 3.8–10.8)

## 2019-08-20 ENCOUNTER — Other Ambulatory Visit (INDEPENDENT_AMBULATORY_CARE_PROVIDER_SITE_OTHER): Payer: Self-pay | Admitting: *Deleted

## 2019-08-20 DIAGNOSIS — D508 Other iron deficiency anemias: Secondary | ICD-10-CM

## 2019-09-01 ENCOUNTER — Other Ambulatory Visit (INDEPENDENT_AMBULATORY_CARE_PROVIDER_SITE_OTHER): Payer: Self-pay | Admitting: *Deleted

## 2019-09-01 DIAGNOSIS — D508 Other iron deficiency anemias: Secondary | ICD-10-CM

## 2019-09-04 DIAGNOSIS — R05 Cough: Secondary | ICD-10-CM | POA: Diagnosis not present

## 2019-09-04 DIAGNOSIS — D509 Iron deficiency anemia, unspecified: Secondary | ICD-10-CM | POA: Diagnosis not present

## 2019-09-04 DIAGNOSIS — I2510 Atherosclerotic heart disease of native coronary artery without angina pectoris: Secondary | ICD-10-CM | POA: Diagnosis not present

## 2019-09-04 DIAGNOSIS — Z0000 Encounter for general adult medical examination without abnormal findings: Secondary | ICD-10-CM | POA: Diagnosis not present

## 2019-09-04 DIAGNOSIS — B9681 Helicobacter pylori [H. pylori] as the cause of diseases classified elsewhere: Secondary | ICD-10-CM | POA: Diagnosis not present

## 2019-09-04 DIAGNOSIS — B372 Candidiasis of skin and nail: Secondary | ICD-10-CM | POA: Diagnosis not present

## 2019-09-04 DIAGNOSIS — R7301 Impaired fasting glucose: Secondary | ICD-10-CM | POA: Diagnosis not present

## 2019-09-04 DIAGNOSIS — M25569 Pain in unspecified knee: Secondary | ICD-10-CM | POA: Diagnosis not present

## 2019-09-04 DIAGNOSIS — J029 Acute pharyngitis, unspecified: Secondary | ICD-10-CM | POA: Diagnosis not present

## 2019-09-04 DIAGNOSIS — J309 Allergic rhinitis, unspecified: Secondary | ICD-10-CM | POA: Diagnosis not present

## 2019-09-06 ENCOUNTER — Emergency Department (HOSPITAL_COMMUNITY)
Admission: EM | Admit: 2019-09-06 | Discharge: 2019-09-06 | Disposition: A | Discharge status: Home / Self Care | Payer: Medicare HMO | Source: Home / Self Care | Attending: Emergency Medicine | Admitting: Emergency Medicine

## 2019-09-06 ENCOUNTER — Emergency Department (HOSPITAL_COMMUNITY): Payer: Medicare HMO

## 2019-09-06 ENCOUNTER — Observation Stay (HOSPITAL_COMMUNITY)
Admission: EM | Admit: 2019-09-06 | Discharge: 2019-09-07 | Disposition: A | Discharge status: Home / Self Care | Payer: Medicare HMO | Attending: Internal Medicine | Admitting: Internal Medicine

## 2019-09-06 ENCOUNTER — Encounter (HOSPITAL_COMMUNITY): Payer: Self-pay

## 2019-09-06 ENCOUNTER — Other Ambulatory Visit: Payer: Self-pay

## 2019-09-06 ENCOUNTER — Encounter (HOSPITAL_COMMUNITY): Payer: Self-pay | Admitting: Emergency Medicine

## 2019-09-06 DIAGNOSIS — I2510 Atherosclerotic heart disease of native coronary artery without angina pectoris: Secondary | ICD-10-CM | POA: Insufficient documentation

## 2019-09-06 DIAGNOSIS — Z20822 Contact with and (suspected) exposure to covid-19: Secondary | ICD-10-CM | POA: Diagnosis not present

## 2019-09-06 DIAGNOSIS — N3289 Other specified disorders of bladder: Secondary | ICD-10-CM

## 2019-09-06 DIAGNOSIS — R319 Hematuria, unspecified: Principal | ICD-10-CM | POA: Insufficient documentation

## 2019-09-06 DIAGNOSIS — Z959 Presence of cardiac and vascular implant and graft, unspecified: Secondary | ICD-10-CM

## 2019-09-06 DIAGNOSIS — I714 Abdominal aortic aneurysm, without rupture, unspecified: Secondary | ICD-10-CM

## 2019-09-06 DIAGNOSIS — Z7902 Long term (current) use of antithrombotics/antiplatelets: Secondary | ICD-10-CM | POA: Insufficient documentation

## 2019-09-06 DIAGNOSIS — Z79899 Other long term (current) drug therapy: Secondary | ICD-10-CM | POA: Diagnosis not present

## 2019-09-06 DIAGNOSIS — R339 Retention of urine, unspecified: Secondary | ICD-10-CM

## 2019-09-06 DIAGNOSIS — D494 Neoplasm of unspecified behavior of bladder: Secondary | ICD-10-CM | POA: Diagnosis not present

## 2019-09-06 DIAGNOSIS — R310 Gross hematuria: Secondary | ICD-10-CM

## 2019-09-06 DIAGNOSIS — I10 Essential (primary) hypertension: Secondary | ICD-10-CM | POA: Insufficient documentation

## 2019-09-06 DIAGNOSIS — E785 Hyperlipidemia, unspecified: Secondary | ICD-10-CM | POA: Diagnosis not present

## 2019-09-06 DIAGNOSIS — Z03818 Encounter for observation for suspected exposure to other biological agents ruled out: Secondary | ICD-10-CM | POA: Diagnosis not present

## 2019-09-06 DIAGNOSIS — Z87891 Personal history of nicotine dependence: Secondary | ICD-10-CM | POA: Insufficient documentation

## 2019-09-06 DIAGNOSIS — Z7982 Long term (current) use of aspirin: Secondary | ICD-10-CM | POA: Insufficient documentation

## 2019-09-06 DIAGNOSIS — N3001 Acute cystitis with hematuria: Secondary | ICD-10-CM | POA: Diagnosis not present

## 2019-09-06 HISTORY — DX: Encounter for other specified aftercare: Z51.89

## 2019-09-06 LAB — CBC WITH DIFFERENTIAL/PLATELET
Abs Immature Granulocytes: 0.04 10*3/uL (ref 0.00–0.07)
Abs Immature Granulocytes: 0.04 10*3/uL (ref 0.00–0.07)
Basophils Absolute: 0.1 10*3/uL (ref 0.0–0.1)
Basophils Absolute: 0.1 10*3/uL (ref 0.0–0.1)
Basophils Relative: 1 %
Basophils Relative: 1 %
Eosinophils Absolute: 1.2 10*3/uL — ABNORMAL HIGH (ref 0.0–0.5)
Eosinophils Absolute: 1.4 10*3/uL — ABNORMAL HIGH (ref 0.0–0.5)
Eosinophils Relative: 13 %
Eosinophils Relative: 13 %
HCT: 42.7 % (ref 39.0–52.0)
HCT: 40.2 % (ref 39.0–52.0)
Hemoglobin: 12.1 g/dL — ABNORMAL LOW (ref 13.0–17.0)
Hemoglobin: 11.5 g/dL — ABNORMAL LOW (ref 13.0–17.0)
Immature Granulocytes: 0 %
Immature Granulocytes: 0 %
Lymphocytes Relative: 21 %
Lymphocytes Relative: 19 %
Lymphs Abs: 2 10*3/uL (ref 0.7–4.0)
Lymphs Abs: 2.1 10*3/uL (ref 0.7–4.0)
MCH: 26.6 pg (ref 26.0–34.0)
MCH: 26.5 pg (ref 26.0–34.0)
MCHC: 28.3 g/dL — ABNORMAL LOW (ref 30.0–36.0)
MCHC: 28.6 g/dL — ABNORMAL LOW (ref 30.0–36.0)
MCV: 93.8 fL (ref 80.0–100.0)
MCV: 92.6 fL (ref 80.0–100.0)
Monocytes Absolute: 0.8 10*3/uL (ref 0.1–1.0)
Monocytes Absolute: 0.9 10*3/uL (ref 0.1–1.0)
Monocytes Relative: 8 %
Monocytes Relative: 8 %
Neutro Abs: 5.5 10*3/uL (ref 1.7–7.7)
Neutro Abs: 6.3 10*3/uL (ref 1.7–7.7)
Neutrophils Relative %: 57 %
Neutrophils Relative %: 59 %
Platelets: 311 10*3/uL (ref 150–400)
Platelets: 294 10*3/uL (ref 150–400)
RBC: 4.55 MIL/uL (ref 4.22–5.81)
RBC: 4.34 MIL/uL (ref 4.22–5.81)
WBC: 9.7 10*3/uL (ref 4.0–10.5)
WBC: 10.8 10*3/uL — ABNORMAL HIGH (ref 4.0–10.5)
nRBC: 0 % (ref 0.0–0.2)
nRBC: 0 % (ref 0.0–0.2)

## 2019-09-06 LAB — URINALYSIS, ROUTINE W REFLEX MICROSCOPIC
Bacteria, UA: NONE SEEN
Bacteria, UA: NONE SEEN
Bilirubin Urine: NEGATIVE
Glucose, UA: 50 mg/dL — AB
Ketones, ur: NEGATIVE mg/dL
Leukocytes,Ua: NEGATIVE
Nitrite: NEGATIVE
Protein, ur: >=300 mg/dL — AB
RBC / HPF: >50 RBC/hpf — ABNORMAL HIGH (ref 0–5)
RBC / HPF: >50 RBC/hpf — ABNORMAL HIGH (ref 0–5)
Specific Gravity, Urine: 1.014 (ref 1.005–1.030)
pH: 7 (ref 5.0–8.0)

## 2019-09-06 LAB — COMPREHENSIVE METABOLIC PANEL
ALT: 26 U/L (ref 0–44)
AST: 43 U/L — ABNORMAL HIGH (ref 15–41)
Albumin: 4.1 g/dL (ref 3.5–5.0)
Alkaline Phosphatase: 54 U/L (ref 38–126)
Anion gap: 9 (ref 5–15)
BUN: 17 mg/dL (ref 8–23)
CO2: 30 mmol/L (ref 22–32)
Calcium: 8.9 mg/dL (ref 8.9–10.3)
Chloride: 103 mmol/L (ref 98–111)
Creatinine, Ser: 0.74 mg/dL (ref 0.61–1.24)
GFR calc Af Amer: >60 mL/min (ref >60)
GFR calc non Af Amer: >60 mL/min (ref >60)
Glucose, Bld: 160 mg/dL — ABNORMAL HIGH (ref 70–99)
Potassium: 3.5 mmol/L (ref 3.5–5.1)
Sodium: 142 mmol/L (ref 135–145)
Total Bilirubin: 0.4 mg/dL (ref 0.3–1.2)
Total Protein: 7.6 g/dL (ref 6.5–8.1)

## 2019-09-06 LAB — BASIC METABOLIC PANEL
Anion gap: 9 (ref 5–15)
BUN: 16 mg/dL (ref 8–23)
CO2: 30 mmol/L (ref 22–32)
Calcium: 9.2 mg/dL (ref 8.9–10.3)
Chloride: 105 mmol/L (ref 98–111)
Creatinine, Ser: 0.57 mg/dL — ABNORMAL LOW (ref 0.61–1.24)
GFR calc Af Amer: >60 mL/min (ref >60)
GFR calc non Af Amer: >60 mL/min (ref >60)
Glucose, Bld: 117 mg/dL — ABNORMAL HIGH (ref 70–99)
Potassium: 3.6 mmol/L (ref 3.5–5.1)
Sodium: 144 mmol/L (ref 135–145)

## 2019-09-06 MED ORDER — SIMVASTATIN 40 MG PO TABS: 40.0000 mg | ORAL_TABLET | Freq: Every day | ORAL | Status: DC

## 2019-09-06 MED ORDER — SODIUM CHLORIDE 0.9 % IV BOLUS
500.0000 mL | Freq: Once | INTRAVENOUS | Status: AC
  Administered 2019-09-06: 500 mL via INTRAVENOUS

## 2019-09-06 MED ORDER — FERROUS SULFATE 325 (65 FE) MG PO TABS
325.0000 mg | ORAL_TABLET | Freq: Two times a day (BID) | ORAL | Status: DC
  Administered 2019-09-07: 325 mg via ORAL
  Filled 2019-09-06: qty 1

## 2019-09-06 MED ORDER — PANTOPRAZOLE SODIUM 40 MG PO TBEC
40.0000 mg | DELAYED_RELEASE_TABLET | Freq: Every day | ORAL | Status: DC
  Administered 2019-09-07: 40 mg via ORAL
  Filled 2019-09-06: qty 1

## 2019-09-06 NOTE — ED Provider Notes (Signed)
[facility] EMERGENCY DEPARTMENT Provider Note   CSN: [id number] Arrival date & time: 09/06/19  1208     History Chief Complaint  Patient presents with  . Hematuria    [name] is a 79 y.o. male.  The history is provided by the patient. No language interpreter was used.  Hematuria This is a new problem. The current episode started yesterday. The problem occurs constantly. The problem has been gradually worsening. Pertinent negatives include no abdominal pain. Nothing aggravates the symptoms. Nothing relieves the symptoms. He has tried nothing for the symptoms. The treatment provided no relief.   Pt has blood in his urine.  Pt denies any other complaint.  Pt was recently anemic and had a blood transfusion     Past Medical History:  Diagnosis Date  . Blood transfusion without reported diagnosis   . Coronary artery disease   . GERD (gastroesophageal reflux disease)   . HOH (hard of hearing)   . Hyperlipidemia   . Hypertension   . Iron deficiency anemia 05/14/2018  . Polio    hx of. right arm atrophy    Patient Active Problem List   Diagnosis Date Noted  . Acute blood loss anemia 08/08/2019  . Iron deficiency anemia due to chronic blood loss 08/08/2019  . Thrombocytosis ([place]) 08/08/2019  . Symptomatic anemia 08/07/2019  . Coronary artery disease 08/07/2019  . Absolute anemia 05/15/2018  . Guaiac positive stools 05/15/2018  . Iron deficiency anemia 05/14/2018  . Leukocytosis 10/09/2017  . Murmur 05/15/2014  . Polio 05/15/2014  . HTN (hypertension) 11/23/2010  . CLAUDICATION 05/19/2009  . Elevated lipids 09/17/2008  . CORONARY ARTERY ANEURYSM 09/17/2008    Past Surgical History:  Procedure Laterality Date  . CARDIAC CATHETERIZATION  07-09-2007  . CHOLECYSTECTOMY    . COLONOSCOPY WITH PROPOFOL N/A 05/31/2018   Procedure: COLONOSCOPY WITH PROPOFOL;  Surgeon: Rogene Houston, MD;  Location: [facility];  Service: Endoscopy;  Laterality: N/A;  1:45  .  CORONARY STENT PLACEMENT     x3 vessels bifurcated single vessel  . ESOPHAGOGASTRODUODENOSCOPY (EGD) WITH PROPOFOL N/A 05/31/2018   Procedure: ESOPHAGOGASTRODUODENOSCOPY (EGD) WITH PROPOFOL;  Surgeon: Rogene Houston, MD;  Location: [facility];  Service: Endoscopy;  Laterality: N/A;  . ESOPHAGOGASTRODUODENOSCOPY (EGD) WITH PROPOFOL N/A 08/08/2019   Procedure: ESOPHAGOGASTRODUODENOSCOPY (EGD) WITH PROPOFOL;  Surgeon: Rogene Houston, MD;  Location: [facility];  Service: Endoscopy;  Laterality: N/A;  . KNEE ARTHROPLASTY Left    pinning surgery  . POLYPECTOMY  05/31/2018   Procedure: POLYPECTOMY;  Surgeon: Rogene Houston, MD;  Location: [facility];  Service: Endoscopy;;  sigmoid       Family History  Problem Relation Age of Onset  . Heart attack Brother 55       died  . Hypertension Mother   . Heart attack Father     Social History   Tobacco Use  . Smoking status: Former Smoker    Packs/day: 1.00    Years: 15.00    Pack years: 15.00    Types: Cigarettes    Quit date: 06/12/2004    Years since quitting: 15.2  . Smokeless tobacco: Never Used  Substance Use Topics  . Alcohol use: No    Alcohol/week: 0.0 standard drinks  . Drug use: No    Home Medications Prior to Admission medications   Medication Sig Start Date End Date Taking? Authorizing Provider  aspirin EC 81 MG tablet Take 2 tablets (162 mg total) by mouth  daily. 08/08/19   Orson Eva, MD  Cholecalciferol (VITAMIN D3 PO) Take 1 capsule by mouth daily.     [provider]  clopidogrel (PLAVIX) 75 MG tablet Take 1 tablet by mouth once daily 05/05/19   Josue Hector, MD  ELDERBERRY PO Take by mouth daily.    [provider]  ferrous sulfate 325 (65 FE) MG tablet Take 1 tablet (325 mg total) by mouth 2 (two) times daily with a meal. 08/08/19   Tat, Shanon Brow, MD  hydrochlorothiazide (MICROZIDE) 12.5 MG capsule Take 1 capsule (12.5 mg total) by mouth daily. 06/18/19 09/16/19  Josue Hector, MD    nitroGLYCERIN (NITROSTAT) 0.4 MG SL tablet Place 1 tablet (0.4 mg total) under the tongue every 5 (five) minutes as needed for chest pain. 01/13/19   Josue Hector, MD  pantoprazole (PROTONIX) 40 MG tablet Take 1 tablet (40 mg total) by mouth 2 (two) times daily. Take 30 min before meals Patient taking differently: Take 40 mg by mouth daily. Take 30 min before meals 08/06/19   Laurine Blazer A, PA-C  simvastatin (ZOCOR) 40 MG tablet Take 40 mg by mouth at bedtime.  11/20/16   [provider]    Allergies    Patient has no known allergies.  Review of Systems   Review of Systems  Gastrointestinal: Negative for abdominal pain.  Genitourinary: Positive for hematuria.  All other systems reviewed and are negative.   Physical Exam Updated Vital Signs BP 140/72 (BP Location: Left Arm)   Pulse 71   Temp 98.5 F (36.9 C) (Oral)   Resp 18   Ht 5\' 6"  (1.676 m)   Wt 88.9 kg   SpO2 96%   BMI 31.64 kg/m   Physical Exam Vitals reviewed.  Eyes:     Pupils: Pupils are equal, round, and reactive to light.  Cardiovascular:     Rate and Rhythm: Normal rate.     Pulses: Normal pulses.  Pulmonary:     Effort: Pulmonary effort is normal.  Musculoskeletal:        General: Normal range of motion.     Cervical back: Normal range of motion.  Skin:    General: Skin is warm.  Neurological:     General: No focal deficit present.     Mental Status: He is alert.  Psychiatric:        Mood and Affect: Mood normal.     ED Results / Procedures / Treatments   Labs (all labs ordered are listed, but only abnormal results are displayed) Labs Reviewed  URINALYSIS, ROUTINE W REFLEX MICROSCOPIC - Abnormal; Notable for the following components:      Result Value   Color, Urine RED (*)    APPearance TURBID (*)    Glucose, UA   (*)    Value: TEST NOT REPORTED DUE TO COLOR INTERFERENCE OF URINE PIGMENT   Hgb urine dipstick   (*)    Value: TEST NOT REPORTED DUE TO COLOR INTERFERENCE OF URINE  PIGMENT   Bilirubin Urine   (*)    Value: TEST NOT REPORTED DUE TO COLOR INTERFERENCE OF URINE PIGMENT   Ketones, ur   (*)    Value: TEST NOT REPORTED DUE TO COLOR INTERFERENCE OF URINE PIGMENT   Protein, ur   (*)    Value: TEST NOT REPORTED DUE TO COLOR INTERFERENCE OF URINE PIGMENT   Nitrite   (*)    Value: TEST NOT REPORTED DUE TO COLOR INTERFERENCE OF URINE PIGMENT  Leukocytes,Ua   (*)    Value: TEST NOT REPORTED DUE TO COLOR INTERFERENCE OF URINE PIGMENT   RBC / HPF >50 (*)    All other components within normal limits  CBC WITH DIFFERENTIAL/PLATELET - Abnormal; Notable for the following components:   Hemoglobin 12.1 (*)    MCHC 28.3 (*)    Eosinophils Absolute 1.2 (*)    All other components within normal limits  COMPREHENSIVE METABOLIC PANEL - Abnormal; Notable for the following components:   Glucose, Bld 160 (*)    AST 43 (*)    All other components within normal limits    EKG None  Radiology CT Renal Stone Study  Result Date: 09/06/2019 CLINICAL DATA:  Progressive hematuria. EXAM: CT ABDOMEN AND PELVIS WITHOUT CONTRAST TECHNIQUE: Multidetector CT imaging of the abdomen and pelvis was performed following the standard protocol without IV contrast. COMPARISON:  None. FINDINGS: Lower chest: Slight atelectasis at the lung bases posteriorly. Extensive coronary artery calcifications. Aortic atherosclerosis. Heart size is normal. No pericardial effusion. Hepatobiliary: No focal liver abnormality is seen. Status post cholecystectomy. No biliary dilatation. Pancreas: Unremarkable. No pancreatic ductal dilatation or surrounding inflammatory changes. Spleen: Normal in size without focal abnormality. Adrenals/Urinary Tract: There is an irregular 4.2 by 2.0 by 1.7 cm mass in the right posterolateral aspect of the bladder consistent with a tumor. Adrenal glands are normal. 2.5 cm low-density lesion on the lower pole of the right kidney, likely a cyst. Left kidney is normal. no hydronephrosis.  Stomach/Bowel: Stomach is within normal limits. Appendix appears normal. No evidence of bowel wall thickening, distention, or inflammatory changes. Vascular/Lymphatic: There is a 6.1 cm maximum diameter saccular aneurysm of the abdominal aorta just below the level of the renal arteries. No adenopathy. Reproductive: Prostate is unremarkable. Other: No abdominal wall hernia or abnormality. No abdominopelvic ascites. Musculoskeletal: No acute abnormality. Degenerative disc disease at L2-3 and L5-S1. Bilateral pars defects at L5 with grade 1 spondylolisthesis at L5-S1. IMPRESSION: 1. 4.2 x 2.0 x 1.7 cm mass in the right posterolateral aspect of the bladder consistent with a tumor. No ureteral obstruction at this time. 2. 6.1 cm maximum diameter saccular aneurysm of the abdominal aorta just below the level of the renal arteries. Vascular surgery consultation recommended due to increased risk of rupture for AAA >5.5 cm. This recommendation follows ACR consensus guidelines: White Paper of the ACR Incidental Findings Committee II on Vascular Findings. J Am Coll Radiol 2013; 10:789-794. Aortic aneurysm NOS (ICD10-I71.9) Aortic Atherosclerosis (ICD10-I70.0). Electronically Signed   By: Lorriane Shire M.D.   On: 09/06/2019 16:31    Procedures Procedures (including critical care time)  Medications Ordered in ED Medications - No data to display  ED Course  I have reviewed the triage vital signs and the nursing notes.  Pertinent labs & imaging results that were available during my care of the patient were reviewed by me and considered in my medical decision making (see chart for details).    MDM Rules/Calculators/A&P                      MDM:  Labs reviewed, Hemoglobin is normal.  CT scan shows an abdominal aneurism and a bladder mass.  I spoke with Dr. Gloriann [facility].  He advised to have pt call office on Monday to be seen.  Pt is advised to go to [place] if symptoms worsen or change.  Pt counseled on AAA and  need to see vascular.   Final Clinical Impression(s) /  ED Diagnoses Final diagnoses:  Hematuria, unspecified type    Rx / DC Orders ED Discharge Orders    None    An After Visit Summary was printed and given to the patient.   Fransico Meadow, Hershal Coria 09/06/19 1711    Noemi Chapel, MD 09/07/19 [phone]

## 2019-09-06 NOTE — ED Provider Notes (Signed)
[facility] DEPT Provider Note   CSN: [id number] Arrival date & time: 09/06/19  1859     History Chief Complaint  Patient presents with  . Urinary Retention    [name] is a 79 y.o. male.  Chief complaint hematuria and urinary retention.  Patient seen earlier in the day at [facility] and diagnosed with a bladder mass and AAA.  He was sent home to be followed up by urology next week.  However, after discharge he was unable to urinate.  Patient now returns to [facility] for further evaluation.  History of CAD with stenting greater than 10 years ago and currently on Plavix.  No fever, chills, flank pain.  Severity of symptoms is moderate.        Past Medical History:  Diagnosis Date  . Blood transfusion without reported diagnosis   . Coronary artery disease   . GERD (gastroesophageal reflux disease)   . HOH (hard of hearing)   . Hyperlipidemia   . Hypertension   . Iron deficiency anemia 05/14/2018  . Polio    hx of. right arm atrophy    Patient Active Problem List   Diagnosis Date Noted  . Acute blood loss anemia 08/08/2019  . Iron deficiency anemia due to chronic blood loss 08/08/2019  . Thrombocytosis ([place]) 08/08/2019  . Symptomatic anemia 08/07/2019  . Coronary artery disease 08/07/2019  . Absolute anemia 05/15/2018  . Guaiac positive stools 05/15/2018  . Iron deficiency anemia 05/14/2018  . Leukocytosis 10/09/2017  . Murmur 05/15/2014  . Polio 05/15/2014  . HTN (hypertension) 11/23/2010  . CLAUDICATION 05/19/2009  . Elevated lipids 09/17/2008  . CORONARY ARTERY ANEURYSM 09/17/2008    Past Surgical History:  Procedure Laterality Date  . CARDIAC CATHETERIZATION  07-09-2007  . CHOLECYSTECTOMY    . COLONOSCOPY WITH PROPOFOL N/A 05/31/2018   Procedure: COLONOSCOPY WITH PROPOFOL;  Surgeon: Rogene Houston, MD;  [facility];  Service: Endoscopy;  Laterality: N/A;  1:45  . CORONARY STENT PLACEMENT     x3  vessels bifurcated single vessel  . ESOPHAGOGASTRODUODENOSCOPY (EGD) WITH PROPOFOL N/A 05/31/2018   Procedure: ESOPHAGOGASTRODUODENOSCOPY (EGD) WITH PROPOFOL;  Surgeon: Rogene Houston, MD;  [facility];  Service: Endoscopy;  Laterality: N/A;  . ESOPHAGOGASTRODUODENOSCOPY (EGD) WITH PROPOFOL N/A 08/08/2019   Procedure: ESOPHAGOGASTRODUODENOSCOPY (EGD) WITH PROPOFOL;  Surgeon: Rogene Houston, MD;  [facility];  Service: Endoscopy;  Laterality: N/A;  . KNEE ARTHROPLASTY Left    pinning surgery  . POLYPECTOMY  05/31/2018   Procedure: POLYPECTOMY;  Surgeon: Rogene Houston, MD;  Location[facility];  Service: Endoscopy;;  sigmoid       Family History  Problem Relation Age of Onset  . Heart attack Brother 72       died  . Hypertension Mother   . Heart attack Father     Social History   Tobacco Use  . Smoking status: Former Smoker    Packs/day: 1.00    Years: 15.00    Pack years: 15.00    Types: Cigarettes    Quit date: 06/12/2004    Years since quitting: 15.2  . Smokeless tobacco: Never Used  Substance Use Topics  . Alcohol use: No    Alcohol/week: 0.0 standard drinks  . Drug use: No    Home Medications Prior to Admission medications   Medication Sig Start Date End Date Taking? Authorizing Provider  aspirin EC 81 MG tablet Take 2 tablets (  162 mg total) by mouth daily. 08/08/19  Yes Tat, Shanon Brow, MD  Cholecalciferol (VITAMIN D3 PO) Take 1 capsule by mouth daily.    Yes [provider]  clopidogrel (PLAVIX) 75 MG tablet Take 1 tablet by mouth once daily 05/05/19  Yes Josue Hector, MD  ELDERBERRY PO Take by mouth daily.   Yes [provider]  ferrous sulfate 325 (65 FE) MG tablet Take 1 tablet (325 mg total) by mouth 2 (two) times daily with a meal. 08/08/19  Yes Tat, Shanon Brow, MD  hydrochlorothiazide (MICROZIDE) 12.5 MG capsule Take 1 capsule (12.5 mg total) by mouth daily. Patient taking differently: Take 12.5 mg by mouth daily as  needed (fluid).  06/18/19 09/16/19 Yes Josue Hector, MD  levofloxacin (LEVAQUIN) 750 MG tablet Take 750 mg by mouth daily. 09/06/19  Yes [provider]  nitroGLYCERIN (NITROSTAT) 0.4 MG SL tablet Place 1 tablet (0.4 mg total) under the tongue every 5 (five) minutes as needed for chest pain. 01/13/19  Yes Josue Hector, MD  pantoprazole (PROTONIX) 40 MG tablet Take 1 tablet (40 mg total) by mouth 2 (two) times daily. Take 30 min before meals Patient taking differently: Take 40 mg by mouth daily. Take 30 min before meals 08/06/19  Yes Minus Liberty, PA-C  simvastatin (ZOCOR) 40 MG tablet Take 40 mg by mouth at bedtime.  11/20/16  Yes [provider]    Allergies    Patient has no known allergies.  Review of Systems   Review of Systems  All other systems reviewed and are negative.   Physical Exam Updated Vital Signs BP 131/70   Pulse 63   Temp 98.8 F (37.1 C) (Oral)   Resp 18   SpO2 93%   Physical Exam Vitals and nursing note reviewed.  Constitutional:      Appearance: He is well-developed.  HENT:     Head: Normocephalic and atraumatic.  Eyes:     Conjunctiva/sclera: Conjunctivae normal.  Cardiovascular:     Rate and Rhythm: Normal rate and regular rhythm.  Pulmonary:     Effort: Pulmonary effort is normal.     Breath sounds: Normal breath sounds.  Abdominal:     General: Bowel sounds are normal.     Palpations: Abdomen is soft.  Genitourinary:    Comments: Foley catheter placed with bloody urine drainage Musculoskeletal:        General: Normal range of motion.     Cervical back: Neck supple.  Skin:    General: Skin is warm and dry.  Neurological:     General: No focal deficit present.     Mental Status: He is alert and oriented to person, place, and time.  Psychiatric:        Behavior: Behavior normal.     ED Results / Procedures / Treatments   Labs (all labs ordered are listed, but only abnormal results are displayed) Labs Reviewed    URINALYSIS, ROUTINE W REFLEX MICROSCOPIC - Abnormal; Notable for the following components:      Result Value   APPearance TURBID (*)    Glucose, UA 50 (*)    Hgb urine dipstick LARGE (*)    Protein, ur >=300 (*)    RBC / HPF >50 (*)    All other components within normal limits  BASIC METABOLIC PANEL - Abnormal; Notable for the following components:   Glucose, Bld 117 (*)    Creatinine, Ser 0.57 (*)    All other components within normal  limits  SARS CORONAVIRUS 2 (TAT 6-24 HRS)  CBC WITH DIFFERENTIAL/PLATELET  TYPE AND SCREEN    EKG None  Radiology CT Renal Stone Study  Result Date: 09/06/2019 CLINICAL DATA:  Progressive hematuria. EXAM: CT ABDOMEN AND PELVIS WITHOUT CONTRAST TECHNIQUE: Multidetector CT imaging of the abdomen and pelvis was performed following the standard protocol without IV contrast. COMPARISON:  None. FINDINGS: Lower chest: Slight atelectasis at the lung bases posteriorly. Extensive coronary artery calcifications. Aortic atherosclerosis. Heart size is normal. No pericardial effusion. Hepatobiliary: No focal liver abnormality is seen. Status post cholecystectomy. No biliary dilatation. Pancreas: Unremarkable. No pancreatic ductal dilatation or surrounding inflammatory changes. Spleen: Normal in size without focal abnormality. Adrenals/Urinary Tract: There is an irregular 4.2 by 2.0 by 1.7 cm mass in the right posterolateral aspect of the bladder consistent with a tumor. Adrenal glands are normal. 2.5 cm low-density lesion on the lower pole of the right kidney, likely a cyst. Left kidney is normal. no hydronephrosis. Stomach/Bowel: Stomach is within normal limits. Appendix appears normal. No evidence of bowel wall thickening, distention, or inflammatory changes. Vascular/Lymphatic: There is a 6.1 cm maximum diameter saccular aneurysm of the abdominal aorta just below the level of the renal arteries. No adenopathy. Reproductive: Prostate is unremarkable. Other: No abdominal  wall hernia or abnormality. No abdominopelvic ascites. Musculoskeletal: No acute abnormality. Degenerative disc disease at L2-3 and L5-S1. Bilateral pars defects at L5 with grade 1 spondylolisthesis at L5-S1. IMPRESSION: 1. 4.2 x 2.0 x 1.7 cm mass in the right posterolateral aspect of the bladder consistent with a tumor. No ureteral obstruction at this time. 2. 6.1 cm maximum diameter saccular aneurysm of the abdominal aorta just below the level of the renal arteries. Vascular surgery consultation recommended due to increased risk of rupture for AAA >5.5 cm. This recommendation follows ACR consensus guidelines: White Paper of the ACR Incidental Findings Committee II on Vascular Findings. J Am Coll Radiol 2013; 10:789-794. Aortic aneurysm NOS (ICD10-I71.9) Aortic Atherosclerosis (ICD10-I70.0). Electronically Signed   By: Lorriane Shire M.D.   On: 09/06/2019 16:31    Procedures Procedures (including critical care time)  Medications Ordered in ED Medications  sodium chloride 0.9 % bolus 500 mL (500 mLs Intravenous New Bag/Given 09/06/19 2206)    ED Course  I have reviewed the triage vital signs and the nursing notes.  Pertinent labs & imaging results that were available during my care of the patient were reviewed by me and considered in my medical decision making (see chart for details).    MDM Rules/Calculators/A&P                      Second visit to the emergency department today for hematuria.  CT scan reviewed from earlier today.  Discussed findings with nephrologist Dr. Gloriann Loan.  Patient is currently taking Plavix for his known CAD.  Additionally, discussed with hospitalist.  Admit. Final Clinical Impression(s) / ED Diagnoses Final diagnoses:  Urinary retention  Bladder mass  Abdominal aortic aneurysm (AAA) without rupture [facility])    Rx / DC Orders ED Discharge Orders    None       Nat Christen, MD 09/06/19 2234

## 2019-09-06 NOTE — H&P (Signed)
History and Physical    [name] [id number] DOB: [birth year] DOA: 09/06/2019  PCP: Celene Squibb, MD  Patient coming from: Home  I have personally briefly reviewed patient's old medical records in [place]  Chief Complaint: hematuria  HPI: [name] is a 79 y.o. male with medical history significant for CAD s/p LAD stent in 2009, polio with right arm paralysis, iron deficiency anemia, hypertension hyperlipidemia who presents with concerns of worsening hematuria.  Patient's wife also presents at bedside.  For the past 3 to 4 weeks patient has been having on and off hematuria with blood clots.  However for the last 2 days it has become more persistent.  He also notes some occasional dysuria.  He denies having any abdominal pain.  Denies any weight loss and continues to have good appetite.  He was evaluated at [facility] ED today with these symptoms and was found to have a 4 x 2 x 1 mass in the right posterior lateral aspect of the bladder consistent with a tumor.  There was also findings of a 6.1 cm maximum diameter saccular aneurysm of the abdominal aorta just below the level of the renal arteries.  He was then discharged home with recommendations to follow-up with urology and vascular surgery.  However upon returning home he had urinary retention and decided to present here to [facility] for further evaluation.  Patient has history of previous tobacco use of about 25 years a pack a day but quit 50 years ago.  He denies alcohol or illicit drug use.  Denies any family history of cancers.  ED Course: He was afebrile and normotensive on room air.  Foley was placed and had significant amount of gross hematuria.  Hemoglobin stable at 12.1 although based on his  HCT appears to be somewhat hemoconcentrated.  ED physician Dr. Lacinda Axon discussed case with urology Dr. Gloriann Loan who recommended patient be admitted by medicine for further work-up.  Review of Systems: Constitutional: No  Weight Change, No Fever ENT/Mouth: No sore throat, No Rhinorrhea Eyes: No Eye Pain, No Vision Changes Cardiovascular: No Chest Pain, no SOB Respiratory: No Cough, No Sputum Gastrointestinal: No Nausea, No Vomiting, No Diarrhea, No Constipation, No Pain Genitourinary: no Urinary Incontinence, No Urgency, No Flank Pain Musculoskeletal: No Arthralgias, No Myalgias Skin: No Skin Lesions, No Pruritus, Neuro: no Weakness, No Numbness Psych: No Anxiety/Panic, No Depression, no decrease appetite Heme/Lymph: No Bruising, No Bleeding  Past Medical History:  Diagnosis Date  . Blood transfusion without reported diagnosis   . Coronary artery disease   . GERD (gastroesophageal reflux disease)   . HOH (hard of hearing)   . Hyperlipidemia   . Hypertension   . Iron deficiency anemia 05/14/2018  . Polio    hx of. right arm atrophy    Past Surgical History:  Procedure Laterality Date  . CARDIAC CATHETERIZATION  07-09-2007  . CHOLECYSTECTOMY    . COLONOSCOPY WITH PROPOFOL N/A 05/31/2018   Procedure: COLONOSCOPY WITH PROPOFOL;  Surgeon: Rogene Houston, MD;  [facility];  Service: Endoscopy;  Laterality: N/A;  1:45  . CORONARY STENT PLACEMENT     x3 vessels bifurcated single vessel  . ESOPHAGOGASTRODUODENOSCOPY (EGD) WITH PROPOFOL N/A 05/31/2018   Procedure: ESOPHAGOGASTRODUODENOSCOPY (EGD) WITH PROPOFOL;  Surgeon: Rogene Houston, MD;  [facility];  Service: Endoscopy;  Laterality: N/A;  . ESOPHAGOGASTRODUODENOSCOPY (EGD) WITH PROPOFOL N/A 08/08/2019   Procedure: ESOPHAGOGASTRODUODENOSCOPY (EGD) WITH PROPOFOL;  Surgeon: Rogene Houston, MD;  [facility];  Service: Endoscopy;  Laterality: N/A;  . KNEE ARTHROPLASTY Left    pinning surgery  . POLYPECTOMY  05/31/2018   Procedure: POLYPECTOMY;  Surgeon: Rogene Houston, MD;  [facility];  Service: Endoscopy;;  sigmoid     reports that he quit smoking about 15 years ago. His smoking use included  cigarettes. He has a 15.00 pack-year smoking history. He has never used smokeless tobacco. He reports that he does not drink alcohol or use drugs.  No Known Allergies  Family History  Problem Relation Age of Onset  . Heart attack Brother 51       died  . Hypertension Mother   . Heart attack Father      Prior to Admission medications   Medication Sig Start Date End Date Taking? Authorizing Provider  aspirin EC 81 MG tablet Take 2 tablets (162 mg total) by mouth daily. 08/08/19  Yes Tat, Shanon Brow, MD  Cholecalciferol (VITAMIN D3 PO) Take 1 capsule by mouth daily.    Yes [provider]  clopidogrel (PLAVIX) 75 MG tablet Take 1 tablet by mouth once daily 05/05/19  Yes Josue Hector, MD  ELDERBERRY PO Take by mouth daily.   Yes [provider]  ferrous sulfate 325 (65 FE) MG tablet Take 1 tablet (325 mg total) by mouth 2 (two) times daily with a meal. 08/08/19  Yes Tat, Shanon Brow, MD  hydrochlorothiazide (MICROZIDE) 12.5 MG capsule Take 1 capsule (12.5 mg total) by mouth daily. Patient taking differently: Take 12.5 mg by mouth daily as needed (fluid).  06/18/19 09/16/19 Yes Josue Hector, MD  levofloxacin (LEVAQUIN) 750 MG tablet Take 750 mg by mouth daily. 09/06/19  Yes [provider]  nitroGLYCERIN (NITROSTAT) 0.4 MG SL tablet Place 1 tablet (0.4 mg total) under the tongue every 5 (five) minutes as needed for chest pain. 01/13/19  Yes Josue Hector, MD  pantoprazole (PROTONIX) 40 MG tablet Take 1 tablet (40 mg total) by mouth 2 (two) times daily. Take 30 min before meals Patient taking differently: Take 40 mg by mouth daily. Take 30 min before meals 08/06/19  Yes Minus Liberty, PA-C  simvastatin (ZOCOR) 40 MG tablet Take 40 mg by mouth at bedtime.  11/20/16  Yes [provider]    Physical Exam: Vitals:   09/06/19 2053 09/06/19 2100 09/06/19 2124 09/06/19 2130  BP: 120/73 122/70 122/70 131/70  Pulse: 65 63 63 63  Resp: 18  18   Temp:      TempSrc:       SpO2: 95% 92% 94% 93%    Constitutional: NAD, calm, comfortable, elderly gentleman laying flat in bed Vitals:   09/06/19 2053 09/06/19 2100 09/06/19 2124 09/06/19 2130  BP: 120/73 122/70 122/70 131/70  Pulse: 65 63 63 63  Resp: 18  18   Temp:      TempSrc:      SpO2: 95% 92% 94% 93%   Eyes: PERRL, lids and conjunctivae normal ENMT: Mucous membranes are moist.  Neck: normal, supple Respiratory: clear to auscultation bilaterally, no wheezing, no crackles. Normal respiratory effort on room air. No accessory muscle use.  Cardiovascular: Regular rate and rhythm, no murmurs / rubs / gallops. No extremity edema. Abdomen: no tenderness, no pulsatile masses palpated.Bowel sounds positive.  Musculoskeletal: no clubbing / cyanosis. No joint deformity upper and lower extremities. Good ROM, no contractures. Normal muscle tone.  Skin: no rashes, lesions, ulcers. No induration Neurologic: CN 2-12 grossly intact.  Sensation intact.  0/5 strength right upper extremity due to history of polio Psychiatric: Normal judgment and insight. Alert and oriented x 3.  Pleasant mood.   Labs on Admission: I have personally reviewed following labs and imaging studies  CBC: Recent Labs  Lab 09/06/19 1251  WBC 9.7  NEUTROABS 5.5  HGB 12.1*  HCT 42.7  MCV 93.8  PLT [id number]   Basic Metabolic Panel: Recent Labs  Lab 09/06/19 1251 09/06/19 2150  NA 142 144  K 3.5 3.6  CL 103 105  CO2 30 30  GLUCOSE 160* 117*  BUN 17 16  CREATININE 0.74 0.57*  CALCIUM 8.9 9.2   GFR: Estimated Creatinine Clearance: 79.4 mL/min (A) (by C-G formula based on SCr of 0.57 mg/dL (L)). Liver Function Tests: Recent Labs  Lab 09/06/19 1251  AST 43*  ALT 26  ALKPHOS 54  BILITOT 0.4  PROT 7.6  ALBUMIN 4.1   No results for input(s): LIPASE, AMYLASE in the last 168 hours. No results for input(s): AMMONIA in the last 168 hours. Coagulation Profile: No results for input(s): INR, PROTIME in the last 168 hours. Cardiac  Enzymes: No results for input(s): CKTOTAL, CKMB, CKMBINDEX, TROPONINI in the last 168 hours. BNP (last 3 results) No results for input(s): PROBNP in the last 8760 hours. HbA1C: No results for input(s): HGBA1C in the last 72 hours. CBG: No results for input(s): GLUCAP in the last 168 hours. Lipid Profile: No results for input(s): CHOL, HDL, LDLCALC, TRIG, CHOLHDL, LDLDIRECT in the last 72 hours. Thyroid Function Tests: No results for input(s): TSH, T4TOTAL, FREET4, T3FREE, THYROIDAB in the last 72 hours. Anemia Panel: No results for input(s): VITAMINB12, FOLATE, FERRITIN, TIBC, IRON, RETICCTPCT in the last 72 hours. Urine analysis:    Component Value Date/Time   COLORURINE YELLOW 09/06/2019 2000   APPEARANCEUR TURBID (A) 09/06/2019 2000   LABSPEC 1.014 09/06/2019 2000   PHURINE 7.0 09/06/2019 2000   GLUCOSEU 50 (A) 09/06/2019 2000   HGBUR LARGE (A) 09/06/2019 2000   BILIRUBINUR NEGATIVE 09/06/2019 2000   [place] NEGATIVE 09/06/2019 2000   PROTEINUR >=300 (A) 09/06/2019 2000   NITRITE NEGATIVE 09/06/2019 2000   LEUKOCYTESUR NEGATIVE 09/06/2019 2000    Radiological Exams on Admission: CT Renal Stone Study  Result Date: 09/06/2019 CLINICAL DATA:  Progressive hematuria. EXAM: CT ABDOMEN AND PELVIS WITHOUT CONTRAST TECHNIQUE: Multidetector CT imaging of the abdomen and pelvis was performed following the standard protocol without IV contrast. COMPARISON:  None. FINDINGS: Lower chest: Slight atelectasis at the lung bases posteriorly. Extensive coronary artery calcifications. Aortic atherosclerosis. Heart size is normal. No pericardial effusion. Hepatobiliary: No focal liver abnormality is seen. Status post cholecystectomy. No biliary dilatation. Pancreas: Unremarkable. No pancreatic ductal dilatation or surrounding inflammatory changes. Spleen: Normal in size without focal abnormality. Adrenals/Urinary Tract: There is an irregular 4.2 by 2.0 by 1.7 cm mass in the right posterolateral  aspect of the bladder consistent with a tumor. Adrenal glands are normal. 2.5 cm low-density lesion on the lower pole of the right kidney, likely a cyst. Left kidney is normal. no hydronephrosis. Stomach/Bowel: Stomach is within normal limits. Appendix appears normal. No evidence of bowel wall thickening, distention, or inflammatory changes. Vascular/Lymphatic: There is a 6.1 cm maximum diameter saccular aneurysm of the abdominal aorta just below the level of the renal arteries. No adenopathy. Reproductive: Prostate is unremarkable. Other: No abdominal wall hernia or abnormality. No abdominopelvic ascites. Musculoskeletal: No acute abnormality. Degenerative disc disease at L2-3 and L5-S1. Bilateral pars defects at L5 with  grade 1 spondylolisthesis at L5-S1. IMPRESSION: 1. 4.2 x 2.0 x 1.7 cm mass in the right posterolateral aspect of the bladder consistent with a tumor. No ureteral obstruction at this time. 2. 6.1 cm maximum diameter saccular aneurysm of the abdominal aorta just below the level of the renal arteries. Vascular surgery consultation recommended due to increased risk of rupture for AAA >5.5 cm. This recommendation follows ACR consensus guidelines: White Paper of the ACR Incidental Findings Committee II on Vascular Findings. J Am Coll Radiol 2013; 10:789-794. Aortic aneurysm NOS (ICD10-I71.9) Aortic Atherosclerosis (ICD10-I70.0). Electronically Signed   By: Lorriane Shire M.D.   On: 09/06/2019 16:31     Assessment/Plan  Hematuria in the setting newly diagnosed bladder mass Urology was notified by ED physician and will need to be reconsulted in the morning Hemoglobin stable Will hold aspirin and Plavix Continue IV fluids  Abdominal aorta aneurysm Incidental finding on CT of a 6.1 cm diameter saccular aneuysm He is hemodynamically stable without any abominal pain at this time will need to consult vascular surgery or have close follow up outpatient  CAD s/p stent remote in 2009, normal  myoview scan in 2010 Hold aspirin and plavix   Hyperlipidemia Continue statin    DVT prophylaxis:SCD Code Status: Full Family Communication: Plan discussed with patient and wife at bedside  disposition Plan: Home with at least 2 midnight stays  Consults called:  Admission status: inpatient  Aariya Ferrick T Jonai Weyland DO [facility]   If 7PM-7AM, please contact night-coverage [email or web address]   09/06/2019, 10:36 PM

## 2019-09-06 NOTE — ED Triage Notes (Signed)
Pt states that he was seen earlier today at [facility] for hematuria. They found a mass on his bladder and scheduled follow up, but told him to come in if he wasn't able to urinate. He states that he hasn't had any output in about 2 hours and that was just a few drops. Reports increasing pressure. Pt is on Plavix.Marland Kitchen

## 2019-09-06 NOTE — Discharge Instructions (Addendum)
Return if any problems. Stop taking aspirin until rechecked.

## 2019-09-06 NOTE — ED Triage Notes (Signed)
Patient c/o hematuria x3 weeks that is progressively getting. Per patient clots in urine. Patient reports having to receive 2 units of blood approx a month ago with no known source of bleeding. Patient does take Plavix.

## 2019-09-07 DIAGNOSIS — R319 Hematuria, unspecified: Secondary | ICD-10-CM

## 2019-09-07 DIAGNOSIS — R310 Gross hematuria: Secondary | ICD-10-CM | POA: Diagnosis not present

## 2019-09-07 DIAGNOSIS — N3289 Other specified disorders of bladder: Secondary | ICD-10-CM | POA: Diagnosis not present

## 2019-09-07 DIAGNOSIS — I714 Abdominal aortic aneurysm, without rupture: Secondary | ICD-10-CM | POA: Diagnosis not present

## 2019-09-07 DIAGNOSIS — R338 Other retention of urine: Secondary | ICD-10-CM | POA: Diagnosis not present

## 2019-09-07 DIAGNOSIS — I2510 Atherosclerotic heart disease of native coronary artery without angina pectoris: Secondary | ICD-10-CM | POA: Diagnosis not present

## 2019-09-07 LAB — CBC
HCT: 41.1 % (ref 39.0–52.0)
Hemoglobin: 11.8 g/dL — ABNORMAL LOW (ref 13.0–17.0)
MCH: 26.6 pg (ref 26.0–34.0)
MCHC: 28.7 g/dL — ABNORMAL LOW (ref 30.0–36.0)
MCV: 92.6 fL (ref 80.0–100.0)
Platelets: 290 10*3/uL (ref 150–400)
RBC: 4.44 MIL/uL (ref 4.22–5.81)
WBC: 11.5 10*3/uL — ABNORMAL HIGH (ref 4.0–10.5)
nRBC: 0 % (ref 0.0–0.2)

## 2019-09-07 LAB — BASIC METABOLIC PANEL
Anion gap: 9 (ref 5–15)
BUN: 14 mg/dL (ref 8–23)
CO2: 29 mmol/L (ref 22–32)
Calcium: 9.1 mg/dL (ref 8.9–10.3)
Chloride: 107 mmol/L (ref 98–111)
Creatinine, Ser: 0.62 mg/dL (ref 0.61–1.24)
GFR calc Af Amer: >60 mL/min (ref >60)
GFR calc non Af Amer: >60 mL/min (ref >60)
Glucose, Bld: 122 mg/dL — ABNORMAL HIGH (ref 70–99)
Potassium: 3.5 mmol/L (ref 3.5–5.1)
Sodium: 145 mmol/L (ref 135–145)

## 2019-09-07 LAB — TYPE AND SCREEN
ABO/RH(D): O POS
Antibody Screen: NEGATIVE

## 2019-09-07 LAB — SARS CORONAVIRUS 2 (TAT 6-24 HRS): SARS Coronavirus 2: NEGATIVE

## 2019-09-07 MED ORDER — PANTOPRAZOLE SODIUM 40 MG PO TBEC: 40.0000 mg | DELAYED_RELEASE_TABLET | Freq: Every day | ORAL | Status: DC

## 2019-09-07 MED ORDER — HYDROCHLOROTHIAZIDE 12.5 MG PO CAPS: 12.5000 mg | ORAL_CAPSULE | Freq: Every day | ORAL | Status: DC | PRN

## 2019-09-07 NOTE — Care Management CC44 (Signed)
Condition Code 44 Documentation Completed  Patient Details  Name: DEMETRIOUS SKENANDORE MRN: [id number] Date of Birth: 1940-11-08   Condition Code 44 given:  Yes Patient signature on Condition Code 44 notice:  Yes Documentation of 2 MD's agreement:  Yes Code 44 added to claim:  Yes    Joaquin Courts, RN 09/07/2019, 10:40 AM

## 2019-09-07 NOTE — Care Management Obs Status (Signed)
[place] NOTIFICATION   Patient Details  Name: TORION SHAAK MRN: [id number] Date of Birth: [birth year]   Medicare Observation Status Notification Given:  Yes    Joaquin Courts, RN 09/07/2019, 10:40 AM

## 2019-09-07 NOTE — Discharge Summary (Signed)
Physician Discharge Summary  [name] [id number] DOB: [birth year] DOA: 09/06/2019  PCP: Celene Squibb, MD  Admit date: 09/06/2019 Discharge date: 09/07/2019  Admitted From: Home Disposition: Home  Recommendations for Outpatient Follow-up:  1. Follow up with PCP in 1 week with repeat CBC/BMP 2. Outpatient follow-up with urology and vascular surgery 3. Follow up in ED if symptoms worsen or new appear   Home Health: No Equipment/Devices: Indwelling Foley catheter  Discharge Condition: Stable CODE STATUS: Full Diet recommendation: Heart healthy  Brief/Interim Summary: 79 year old male with history of CAD status post LAD stent in 2009, polio with right arm paralysis, iron deficiency anemia, hypertension, hyperlipidemia presented with worsening hematuria.  He was found to have bladder tumor recently and was supposed to have outpatient urology evaluation and vascular surgery evaluation for 6.1 cm saccular aneurysm of the abdominal aorta.  Because of worsening hematuria, he presented to the vascular ED.  Neurology was consulted.  Foley catheter was placed.  Urology has evaluated the patient during the hospitalization and cleared the patient for discharge with close outpatient follow-up with urology.  Patient will also be evaluated by vascular surgery as an outpatient.  Hemoglobin has remained stable.  He will be discharged home today off aspirin and Plavix.  Discharge Diagnoses:   Hematuria in the setting of newly diagnosed bladder mass -Currently has Foley catheter which will be continued on discharge -Aspirin and Plavix will remain on hold on discharge -Hemoglobin stable -Urology/Dr. Gloriann Loan has evaluated the patient during the hospitalization and cleared the patient for discharge with close outpatient follow-up with urology.   Abdominal aortic aneurysm -Incidental finding of 6.1 cm diameter saccular aneurysm of abdominal aorta on CT without contrast -Spoke to Dr. Gilles Chiquito  surgery on phone and he reviewed the CAT scan and stated that patient does not have any leak.  Patient does not complain of any abdominal pain.  Dr. Scot Dock will arrange for outpatient vascular surgery evaluation and follow-up.  Patient/family aware of this  CAD status post stent -Remote in 2009.  Normal Myoview scan in 2010 -Outpatient follow-up with cardiology.  Aspirin and Plavix on hold for now  Hyperlipidemia -Continue statin  Discharge Instructions  Discharge Instructions    Ambulatory referral to Vascular Surgery   Complete by: As directed    Abdominal aortic aneurysm   Diet - low sodium heart healthy   Complete by: As directed    Increase activity slowly   Complete by: As directed      Allergies as of 09/07/2019   No Known Allergies     Medication List    STOP taking these medications   aspirin EC 81 MG tablet   clopidogrel 75 MG tablet Commonly known as: PLAVIX     TAKE these medications   ELDERBERRY PO Take by mouth daily.   ferrous sulfate 325 (65 FE) MG tablet Take 1 tablet (325 mg total) by mouth 2 (two) times daily with a meal.   hydrochlorothiazide 12.5 MG capsule Commonly known as: MICROZIDE Take 1 capsule (12.5 mg total) by mouth daily as needed (fluid).   levofloxacin 750 MG tablet Commonly known as: LEVAQUIN Take 750 mg by mouth daily.   nitroGLYCERIN 0.4 MG SL tablet Commonly known as: NITROSTAT Place 1 tablet (0.4 mg total) under the tongue every 5 (five) minutes as needed for chest pain.   pantoprazole 40 MG tablet Commonly known as: PROTONIX Take 1 tablet (40 mg total) by mouth daily. Take 30 min before meals   simvastatin 40  MG tablet Commonly known as: ZOCOR Take 40 mg by mouth at bedtime.   VITAMIN D3 PO Take 1 capsule by mouth daily.      Follow-up Information    Celene Squibb, MD. Schedule an appointment as soon as possible for a visit in 1 week(s).   Specialty: Internal Medicine Why: with repeat cbc/bmp Contact  information: [address] [place] [address] [phone]        Josue Hector, MD .   Specialty: Cardiology Contact information: [phone] [place] [address] [phone]        Lucas Mallow, MD. Schedule an appointment as soon as possible for a visit in 1 week(s).   Specialty: Urology Contact information: [place] [id number] [phone]        Angelia Mould, MD. Schedule an appointment as soon as possible for a visit in 1 week(s).   Specialties: Vascular Surgery, Cardiology Contact information: [address] [place] [address] [phone]          No Known Allergies  Consultations:  Urology/Dr. Wendi Snipes to Dr. Gilles Chiquito surgery on phone on 09/07/2019   Procedures/Studies: CT Renal Stone Study  Result Date: 09/06/2019 CLINICAL DATA:  Progressive hematuria. EXAM: CT ABDOMEN AND PELVIS WITHOUT CONTRAST TECHNIQUE: Multidetector CT imaging of the abdomen and pelvis was performed following the standard protocol without IV contrast. COMPARISON:  None. FINDINGS: Lower chest: Slight atelectasis at the lung bases posteriorly. Extensive coronary artery calcifications. Aortic atherosclerosis. Heart size is normal. No pericardial effusion. Hepatobiliary: No focal liver abnormality is seen. Status post cholecystectomy. No biliary dilatation. Pancreas: Unremarkable. No pancreatic ductal dilatation or surrounding inflammatory changes. Spleen: Normal in size without focal abnormality. Adrenals/Urinary Tract: There is an irregular 4.2 by 2.0 by 1.7 cm mass in the right posterolateral aspect of the bladder consistent with a tumor. Adrenal glands are normal. 2.5 cm low-density lesion on the lower pole of the right kidney, likely a cyst. Left kidney is normal. no hydronephrosis. Stomach/Bowel: Stomach is within normal limits. Appendix appears normal. No evidence of bowel wall thickening, distention, or  inflammatory changes. Vascular/Lymphatic: There is a 6.1 cm maximum diameter saccular aneurysm of the abdominal aorta just below the level of the renal arteries. No adenopathy. Reproductive: Prostate is unremarkable. Other: No abdominal wall hernia or abnormality. No abdominopelvic ascites. Musculoskeletal: No acute abnormality. Degenerative disc disease at L2-3 and L5-S1. Bilateral pars defects at L5 with grade 1 spondylolisthesis at L5-S1. IMPRESSION: 1. 4.2 x 2.0 x 1.7 cm mass in the right posterolateral aspect of the bladder consistent with a tumor. No ureteral obstruction at this time. 2. 6.1 cm maximum diameter saccular aneurysm of the abdominal aorta just below the level of the renal arteries. Vascular surgery consultation recommended due to increased risk of rupture for AAA >5.5 cm. This recommendation follows ACR consensus guidelines: White Paper of the ACR Incidental Findings Committee II on Vascular Findings. J Am Coll Radiol 2013; 10:789-794. Aortic aneurysm NOS (ICD10-I71.9) Aortic Atherosclerosis (ICD10-I70.0). Electronically Signed   By: Lorriane Shire M.D.   On: 09/06/2019 16:31       Subjective: Patient seen and examined at bedside.  He denies any abdominal pain, nausea, vomiting, fever, shortness of breath.  Discharge Exam: Vitals:   09/07/19 0022 09/07/19 0528  BP: (!) 170/82 139/73  Pulse: 84 74  Resp: 16 16  Temp: 98.1 F (36.7 C) 98.2 F (36.8 C)  SpO2: 90% 92%  General: Pt is alert, awake, not in acute distress.  He has indwelling Foley catheter present with pinkish urine.   Cardiovascular: rate controlled, S1/S2 + Respiratory: bilateral decreased breath sounds at bases with some scattered crackles Abdominal: Soft, NT, ND, bowel sounds + Extremities: no edema, no cyanosis    The results of significant diagnostics from this hospitalization (including imaging, microbiology, ancillary and laboratory) are listed below for reference.     Microbiology: Recent  Results (from the past 240 hour(s))  SARS CORONAVIRUS 2 (TAT 6-24 HRS) Nasopharyngeal Nasopharyngeal Swab     Status: None   Collection Time: 09/06/19 10:20 PM   Specimen: Nasopharyngeal Swab  Result Value Ref Range Status   SARS Coronavirus 2 NEGATIVE NEGATIVE Final    Comment: (NOTE) SARS-CoV-2 target nucleic acids are NOT DETECTED. The SARS-CoV-2 RNA is generally detectable in upper and lower respiratory specimens during the acute phase of infection. Negative results do not preclude SARS-CoV-2 infection, do not rule out co-infections with other pathogens, and should not be used as the sole basis for treatment or other patient management decisions. Negative results must be combined with clinical observations, patient history, and epidemiological information. The expected result is Negative. Fact Sheet for Patients: SugarRoll.be Fact Sheet for Healthcare Providers: https://www.woods-mathews.com/ This test is not yet approved or cleared by the [place] FDA and  has been authorized for detection and/or diagnosis of SARS-CoV-2 by FDA under an Emergency Use Authorization (EUA). This EUA will remain  in effect (meaning this test can be used) for the duration of the COVID-19 declaration under Section 56 4(b)(1) of the Act, 21 U.S.C. section 360bbb-3(b)(1), unless the authorization is terminated or revoked sooner. Performed at [facility], [place] [address], [place], [place] [address]      Labs: BNP (last 3 results) No results for input(s): BNP in the last 8760 hours. Basic Metabolic Panel: Recent Labs  Lab 09/06/19 1251 09/06/19 2150 09/07/19 0524  NA 142 144 145  K 3.5 3.6 3.5  CL 103 105 107  CO2 30 30 29   GLUCOSE 160* 117* 122*  BUN 17 16 14   CREATININE 0.74 0.57* 0.62  CALCIUM 8.9 9.2 9.1   Liver Function Tests: Recent Labs  Lab 09/06/19 1251  AST 43*  ALT 26  ALKPHOS 54  BILITOT 0.4  PROT 7.6  ALBUMIN 4.1    No results for input(s): LIPASE, AMYLASE in the last 168 hours. No results for input(s): AMMONIA in the last 168 hours. CBC: Recent Labs  Lab 09/06/19 1251 09/06/19 2150 09/07/19 0524  WBC 9.7 10.8* 11.5*  NEUTROABS 5.5 6.3  --   HGB 12.1* 11.5* 11.8*  HCT 42.7 40.2 41.1  MCV 93.8 92.6 92.6  PLT 311 294 290   Cardiac Enzymes: No results for input(s): CKTOTAL, CKMB, CKMBINDEX, TROPONINI in the last 168 hours. BNP: Invalid input(s): POCBNP CBG: No results for input(s): GLUCAP in the last 168 hours. D-Dimer No results for input(s): DDIMER in the last 72 hours. Hgb A1c No results for input(s): HGBA1C in the last 72 hours. Lipid Profile No results for input(s): CHOL, HDL, LDLCALC, TRIG, CHOLHDL, LDLDIRECT in the last 72 hours. Thyroid function studies No results for input(s): TSH, T4TOTAL, T3FREE, THYROIDAB in the last 72 hours.  Invalid input(s): FREET3 Anemia work up No results for input(s): VITAMINB12, FOLATE, FERRITIN, TIBC, IRON, RETICCTPCT in the last 72 hours. Urinalysis    Component Value Date/Time   COLORURINE YELLOW 09/06/2019 2000   APPEARANCEUR TURBID (A) 09/06/2019 2000  LABSPEC 1.014 09/06/2019 2000   PHURINE 7.0 09/06/2019 2000   GLUCOSEU 50 (A) 09/06/2019 2000   HGBUR LARGE (A) 09/06/2019 2000   BILIRUBINUR NEGATIVE 09/06/2019 2000   KETONESUR NEGATIVE 09/06/2019 2000   PROTEINUR >=300 (A) 09/06/2019 2000   NITRITE NEGATIVE 09/06/2019 2000   LEUKOCYTESUR NEGATIVE 09/06/2019 2000   Sepsis Labs Invalid input(s): PROCALCITONIN,  WBC,  LACTICIDVEN Microbiology Recent Results (from the past 240 hour(s))  SARS CORONAVIRUS 2 (TAT 6-24 HRS) Nasopharyngeal Nasopharyngeal Swab     Status: None   Collection Time: 09/06/19 10:20 PM   Specimen: Nasopharyngeal Swab  Result Value Ref Range Status   SARS Coronavirus 2 NEGATIVE NEGATIVE Final    Comment: (NOTE) SARS-CoV-2 target nucleic acids are NOT DETECTED. The SARS-CoV-2 RNA is generally detectable in  upper and lower respiratory specimens during the acute phase of infection. Negative results do not preclude SARS-CoV-2 infection, do not rule out co-infections with other pathogens, and should not be used as the sole basis for treatment or other patient management decisions. Negative results must be combined with clinical observations, patient history, and epidemiological information. The expected result is Negative. Fact Sheet for Patients: SugarRoll.be Fact Sheet for Healthcare Providers: https://www.woods-mathews.com/ This test is not yet approved or cleared by the [place] FDA and  has been authorized for detection and/or diagnosis of SARS-CoV-2 by FDA under an Emergency Use Authorization (EUA). This EUA will remain  in effect (meaning this test can be used) for the duration of the COVID-19 declaration under Section 56 4(b)(1) of the Act, 21 U.S.C. section 360bbb-3(b)(1), unless the authorization is terminated or revoked sooner. Performed at [facility], [place] [address] [place], [place] [address]      Time coordinating discharge: 35 minutes  SIGNED:   Aline August, MD  [facility] 09/07/2019, 9:49 AM

## 2019-09-07 NOTE — Consult Note (Signed)
H&P Physician requesting consult: [name]  Chief Complaint: Gross hematuria, bladder mass  History of Present Illness: 79 year old male presented to [facility] emergency department with gross hematuria.  Hemoglobin was stable at the time.  He was able to void and therefore he was discharged with plans for follow-up this next week.  Soon thereafter, he developed urinary retention therefore presented to [place] [place] [facility].  Foley catheter was placed.  This has been draining well.  Urine is starting to clear up some.  It is dark pink to light red and very thin.  Hemoglobin has been stable.  CT renal stone protocol revealed a 4.2 x 2.0 x 1.7 cm mass in the right posterior lateral bladder consistent with tumor.  There was no obvious obstruction of the ureter.  He was also noted to have a 6.1 cm AAA.  The patient had a stent placed in 2009.  He remains on Plavix.  This was held today.  Today, the patient has no complaints.  Creatinine is 0.62.  His cardiologist is [name].  Past Medical History:  Diagnosis Date  . Blood transfusion without reported diagnosis   . Coronary artery disease   . GERD (gastroesophageal reflux disease)   . HOH (hard of hearing)   . Hyperlipidemia   . Hypertension   . Iron deficiency anemia 05/14/2018  . Polio    hx of. right arm atrophy   Past Surgical History:  Procedure Laterality Date  . CARDIAC CATHETERIZATION  07-09-2007  . CHOLECYSTECTOMY    . COLONOSCOPY WITH PROPOFOL N/A 05/31/2018   Procedure: COLONOSCOPY WITH PROPOFOL;  Surgeon: Rogene Houston, MD;  [facility];  Service: Endoscopy;  Laterality: N/A;  1:45  . CORONARY STENT PLACEMENT     x3 vessels bifurcated single vessel  . ESOPHAGOGASTRODUODENOSCOPY (EGD) WITH PROPOFOL N/A 05/31/2018   Procedure: ESOPHAGOGASTRODUODENOSCOPY (EGD) WITH PROPOFOL;  Surgeon: Rogene Houston, MD;  [facility];  Service: Endoscopy;  Laterality: N/A;  . ESOPHAGOGASTRODUODENOSCOPY (EGD) WITH PROPOFOL  N/A 08/08/2019   Procedure: ESOPHAGOGASTRODUODENOSCOPY (EGD) WITH PROPOFOL;  Surgeon: Rogene Houston, MD;  [facility];  Service: Endoscopy;  Laterality: N/A;  . KNEE ARTHROPLASTY Left    pinning surgery  . POLYPECTOMY  05/31/2018   Procedure: POLYPECTOMY;  Surgeon: Rogene Houston, MD;  [facility];  Service: Endoscopy;;  sigmoid    Home Medications:  Medications Prior to Admission  Medication Sig Dispense Refill Last Dose  . aspirin EC 81 MG tablet Take 2 tablets (162 mg total) by mouth daily.   09/06/2019 at Unknown time  . Cholecalciferol (VITAMIN D3 PO) Take 1 capsule by mouth daily.    09/06/2019 at Unknown time  . clopidogrel (PLAVIX) 75 MG tablet Take 1 tablet by mouth once daily 90 tablet 1 09/06/2019 at Unknown time  . ELDERBERRY PO Take by mouth daily.   09/06/2019 at Unknown time  . ferrous sulfate 325 (65 FE) MG tablet Take 1 tablet (325 mg total) by mouth 2 (two) times daily with a meal.  3 09/06/2019 at Unknown time  . levofloxacin (LEVAQUIN) 750 MG tablet Take 750 mg by mouth daily.     . nitroGLYCERIN (NITROSTAT) 0.4 MG SL tablet Place 1 tablet (0.4 mg total) under the tongue every 5 (five) minutes as needed for chest pain. 25 tablet 3 unknown at unknown  . simvastatin (ZOCOR) 40 MG tablet Take 40 mg by mouth at bedtime.    09/05/2019 at Unknown time  . [  DISCONTINUED] hydrochlorothiazide (MICROZIDE) 12.5 MG capsule Take 1 capsule (12.5 mg total) by mouth daily. (Patient taking differently: Take 12.5 mg by mouth daily as needed (fluid). ) 90 capsule 3 Past Month at Unknown time  . [DISCONTINUED] pantoprazole (PROTONIX) 40 MG tablet Take 1 tablet (40 mg total) by mouth 2 (two) times daily. Take 30 min before meals (Patient taking differently: Take 40 mg by mouth daily. Take 30 min before meals) 60 tablet 3 09/06/2019 at Unknown time   Allergies: No Known Allergies  Family History  Problem Relation Age of Onset  . Heart attack Brother 70       died  .  Hypertension Mother   . Heart attack Father    Social History:  reports that he quit smoking about 15 years ago. His smoking use included cigarettes. He has a 15.00 pack-year smoking history. He has never used smokeless tobacco. He reports that he does not drink alcohol or use drugs.  ROS: A complete review of systems was performed.  All systems are negative except for pertinent findings as noted. ROS   Physical Exam:  Vital signs in last 24 hours: Temp:  [98.1 F (36.7 C)-98.8 F (37.1 C)] 98.2 F (36.8 C) (03/28 0528) Pulse Rate:  [60-84] 74 (03/28 0528) Resp:  [13-18] 16 (03/28 0528) BP: (120-170)/(70-87) 139/73 (03/28 0528) SpO2:  [90 %-98 %] 92 % (03/28 0528) Weight:  [88.4 kg-88.9 kg] 88.4 kg (03/28 0100) General:  Alert and oriented, No acute distress HEENT: Normocephalic, atraumatic Neck: No JVD or lymphadenopathy Cardiovascular: Regular rate and rhythm Lungs: Regular rate and effort Abdomen: Soft, nontender, nondistended, no abdominal masses Back: No CVA tenderness Genitourinary: Foley catheter in place draining dark pink to light red urine, thin and see through Extremities: No edema Neurologic: Grossly intact  Laboratory Data:  Results for orders placed or performed during the hospital encounter of 09/06/19 (from the past 24 hour(s))  Urinalysis, Routine w reflex microscopic- may I&O cath if menses     Status: Abnormal   Collection Time: 09/06/19  8:00 PM  Result Value Ref Range   Color, Urine YELLOW YELLOW   APPearance TURBID (A) CLEAR   Specific Gravity, Urine 1.014 1.005 - 1.030   pH 7.0 5.0 - 8.0   Glucose, UA 50 (A) NEGATIVE mg/dL   Hgb urine dipstick LARGE (A) NEGATIVE   Bilirubin Urine NEGATIVE NEGATIVE   Ketones, ur NEGATIVE NEGATIVE mg/dL   Protein, ur >=300 (A) NEGATIVE mg/dL   Nitrite NEGATIVE NEGATIVE   Leukocytes,Ua NEGATIVE NEGATIVE   RBC / HPF >50 (H) 0 - 5 RBC/hpf   WBC, UA 0-5 0 - 5 WBC/hpf   Bacteria, UA NONE SEEN NONE SEEN   WBC Clumps  PRESENT   ABO/Rh     Status: None (Preliminary result)   Collection Time: 09/06/19  9:09 PM  Result Value Ref Range   ABO/RH(D)      O POS Performed at [facility], [place] [address], [place], [place] [address]   CBC with Differential     Status: Abnormal   Collection Time: 09/06/19  9:50 PM  Result Value Ref Range   WBC 10.8 (H) 4.0 - 10.5 K/uL   RBC 4.34 4.22 - 5.81 MIL/uL   Hemoglobin 11.5 (L) 13.0 - 17.0 g/dL   HCT 40.2 39.0 - 52.0 %   MCV 92.6 80.0 - 100.0 fL   MCH 26.5 26.0 - 34.0 pg   MCHC 28.6 (L) 30.0 - 36.0 g/dL   RDW  Not Measured 11.5 - 15.5 %   Platelets 294 150 - 400 K/uL   nRBC 0.0 0.0 - 0.2 %   Neutrophils Relative % 59 %   Neutro Abs 6.3 1.7 - 7.7 K/uL   Lymphocytes Relative 19 %   Lymphs Abs 2.1 0.7 - 4.0 K/uL   Monocytes Relative 8 %   Monocytes Absolute 0.9 0.1 - 1.0 K/uL   Eosinophils Relative 13 %   Eosinophils Absolute 1.4 (H) 0.0 - 0.5 K/uL   Basophils Relative 1 %   Basophils Absolute 0.1 0.0 - 0.1 K/uL   Immature Granulocytes 0 %   Abs Immature Granulocytes 0.04 0.00 - 0.07 K/uL   Polychromasia PRESENT    Ovalocytes PRESENT   Basic metabolic panel     Status: Abnormal   Collection Time: 09/06/19  9:50 PM  Result Value Ref Range   Sodium 144 135 - 145 mmol/L   Potassium 3.6 3.5 - 5.1 mmol/L   Chloride 105 98 - 111 mmol/L   CO2 30 22 - 32 mmol/L   Glucose, Bld 117 (H) 70 - 99 mg/dL   BUN 16 8 - 23 mg/dL   Creatinine, Ser 0.57 (L) 0.61 - 1.24 mg/dL   Calcium 9.2 8.9 - 10.3 mg/dL   GFR calc non Af Amer >60 >60 mL/min   GFR calc Af Amer >60 >60 mL/min   Anion gap 9 5 - 15  Type and screen Vazquez     Status: None   Collection Time: 09/06/19  9:50 PM  Result Value Ref Range   ABO/RH(D) O POS    Antibody Screen NEG    Sample Expiration      09/09/2019,2359 Performed at [facility], [place] [address] [place], [place] [address]   SARS CORONAVIRUS 2 (TAT 6-24 HRS) Nasopharyngeal  Nasopharyngeal Swab     Status: None   Collection Time: 09/06/19 10:20 PM   Specimen: Nasopharyngeal Swab  Result Value Ref Range   SARS Coronavirus 2 NEGATIVE NEGATIVE  Basic metabolic panel     Status: Abnormal   Collection Time: 09/07/19  5:24 AM  Result Value Ref Range   Sodium 145 135 - 145 mmol/L   Potassium 3.5 3.5 - 5.1 mmol/L   Chloride 107 98 - 111 mmol/L   CO2 29 22 - 32 mmol/L   Glucose, Bld 122 (H) 70 - 99 mg/dL   BUN 14 8 - 23 mg/dL   Creatinine, Ser 0.62 0.61 - 1.24 mg/dL   Calcium 9.1 8.9 - 10.3 mg/dL   GFR calc non Af Amer >60 >60 mL/min   GFR calc Af Amer >60 >60 mL/min   Anion gap 9 5 - 15  CBC     Status: Abnormal   Collection Time: 09/07/19  5:24 AM  Result Value Ref Range   WBC 11.5 (H) 4.0 - 10.5 K/uL   RBC 4.44 4.22 - 5.81 MIL/uL   Hemoglobin 11.8 (L) 13.0 - 17.0 g/dL   HCT 41.1 39.0 - 52.0 %   MCV 92.6 80.0 - 100.0 fL   MCH 26.6 26.0 - 34.0 pg   MCHC 28.7 (L) 30.0 - 36.0 g/dL   RDW Not Measured 11.5 - 15.5 %   Platelets 290 150 - 400 K/uL   nRBC 0.0 0.0 - 0.2 %   Recent Results (from the past 240 hour(s))  SARS CORONAVIRUS 2 (TAT 6-24 HRS) Nasopharyngeal Nasopharyngeal Swab     Status: None   Collection Time: 09/06/19 10:20  PM   Specimen: Nasopharyngeal Swab  Result Value Ref Range Status   SARS Coronavirus 2 NEGATIVE NEGATIVE Final    Comment: (NOTE) SARS-CoV-2 target nucleic acids are NOT DETECTED. The SARS-CoV-2 RNA is generally detectable in upper and lower respiratory specimens during the acute phase of infection. Negative results do not preclude SARS-CoV-2 infection, do not rule out co-infections with other pathogens, and should not be used as the sole basis for treatment or other patient management decisions. Negative results must be combined with clinical observations, patient history, and epidemiological information. The expected result is Negative. Fact Sheet for Patients: SugarRoll.be Fact Sheet for  Healthcare Providers: https://www.woods-mathews.com/ This test is not yet approved or cleared by the [place] FDA and  has been authorized for detection and/or diagnosis of SARS-CoV-2 by FDA under an Emergency Use Authorization (EUA). This EUA will remain  in effect (meaning this test can be used) for the duration of the COVID-19 declaration under Section 56 4(b)(1) of the Act, 21 U.S.C. section 360bbb-3(b)(1), unless the authorization is terminated or revoked sooner. Performed at [facility], [place] [address] [place], [place] [address]    Creatinine: Recent Labs    09/06/19 1251 09/06/19 2150 09/07/19 0524  CREATININE 0.74 0.57* 0.62   CT scan personally reviewed and is detailed in history of present illness  Impression/Assessment:  Gross hematuria Bladder neoplasm Urinary retention  Plan:  Continue Foley catheter.  Recommend holding Plavix.  I will work on getting him on the schedule for a transurethral resection of bladder tumor.  I discussed the risk and benefits.  Risk include bleeding, infection, injury to surrounding structures including bladder perforation and the need for prolonged catheter, need for additional procedures.  He expressed understanding.  Marton Redwood, III 09/07/2019, 10:34 AM

## 2019-09-08 DIAGNOSIS — D72829 Elevated white blood cell count, unspecified: Secondary | ICD-10-CM | POA: Diagnosis not present

## 2019-09-08 DIAGNOSIS — N3001 Acute cystitis with hematuria: Secondary | ICD-10-CM | POA: Diagnosis not present

## 2019-09-08 DIAGNOSIS — D649 Anemia, unspecified: Secondary | ICD-10-CM | POA: Diagnosis not present

## 2019-09-08 DIAGNOSIS — D509 Iron deficiency anemia, unspecified: Secondary | ICD-10-CM | POA: Diagnosis not present

## 2019-09-08 DIAGNOSIS — E1169 Type 2 diabetes mellitus with other specified complication: Secondary | ICD-10-CM | POA: Diagnosis not present

## 2019-09-08 DIAGNOSIS — B9681 Helicobacter pylori [H. pylori] as the cause of diseases classified elsewhere: Secondary | ICD-10-CM | POA: Diagnosis not present

## 2019-09-08 DIAGNOSIS — B372 Candidiasis of skin and nail: Secondary | ICD-10-CM | POA: Diagnosis not present

## 2019-09-08 LAB — GLUCOSE, CAPILLARY: Glucose-Capillary: 131 mg/dL — ABNORMAL HIGH (ref 70–99)

## 2019-09-08 LAB — ABO/RH: ABO/RH(D): O POS

## 2019-09-09 ENCOUNTER — Other Ambulatory Visit: Payer: Self-pay

## 2019-09-09 DIAGNOSIS — C678 Malignant neoplasm of overlapping sites of bladder: Secondary | ICD-10-CM | POA: Diagnosis not present

## 2019-09-09 DIAGNOSIS — I713 Abdominal aortic aneurysm, ruptured, unspecified: Secondary | ICD-10-CM

## 2019-09-10 ENCOUNTER — Other Ambulatory Visit: Payer: Self-pay

## 2019-09-11 ENCOUNTER — Other Ambulatory Visit: Payer: Self-pay | Admitting: Urology

## 2019-09-18 ENCOUNTER — Other Ambulatory Visit: Payer: Self-pay | Admitting: *Deleted

## 2019-09-18 DIAGNOSIS — I713 Abdominal aortic aneurysm, ruptured, unspecified: Secondary | ICD-10-CM

## 2019-09-22 NOTE — Patient Instructions (Addendum)
DUE TO COVID-19 ONLY ONE VISITOR IS ALLOWED TO COME WITH YOU AND STAY IN THE WAITING ROOM ONLY DURING PRE OP AND PROCEDURE DAY OF SURGERY. TWO VISITOR MAY VISIT WITH YOU AFTER SURGERY IN YOUR PRIVATE ROOM DURING VISITING HOURS ONLY!   10a-8p  YOU NEED TO HAVE A COVID 19 TEST ON___4-13-21____ @__2 :00_____, THIS TEST MUST BE DONE BEFORE SURGERY, COME    [address],  ONCE YOUR COVID TEST IS COMPLETED, PLEASE BEGIN THE QUARANTINE INSTRUCTIONS AS OUTLINED IN YOUR HANDOUT.                [name]  09/22/2019   Your procedure is scheduled on: 09-26-19   Report to [facility] Main  Entrance   Report to admitting at    [place] AM     Call this number if you have problems the morning of surgery [phone]    Remember: Do not eat food or drink liquids :After Midnight.   BRUSH YOUR TEETH MORNING OF SURGERY AND RINSE YOUR MOUTH OUT, NO CHEWING GUM CANDY OR MINTS.     Take these medicines the morning of surgery with A SIP OF WATER: NONE                                You may not have any metal on your body including hair pins and              piercings  Do not wear jewelry,, lotions, powders or perfumes, deodorant              Men may shave face and neck.   Do not bring valuables to the hospital. [name].  Contacts, dentures or bridgework may not be worn into surgery.      Patients discharged the day of surgery will not be allowed to drive home. IF YOU ARE HAVING SURGERY AND GOING HOME THE SAME DAY, YOU MUST HAVE AN ADULT TO DRIVE YOU HOME AND BE WITH YOU FOR 24 HOURS. YOU MAY GO HOME BY TAXI OR UBER OR ORTHERWISE, BUT AN ADULT MUST ACCOMPANY YOU HOME AND STAY WITH YOU FOR 24 HOURS.  Name and phone number of your driver:  Special Instructions: N/A              Please read over the following fact sheets you were given: _____________________________________________________________________             [facility] -  Preparing for Surgery Before surgery, you can play an important role.  Because skin is not sterile, your skin needs to be as free of germs as possible.  You can reduce the number of germs on your skin by washing with CHG (chlorahexidine gluconate) soap before surgery.  CHG is an antiseptic cleaner which kills germs and bonds with the skin to continue killing germs even after washing. Please DO NOT use if you have an allergy to CHG or antibacterial soaps.  If your skin becomes reddened/irritated stop using the CHG and inform your nurse when you arrive at Short Stay. Do not shave (including legs and underarms) for at least 48 hours prior to the first CHG shower.  You may shave your face/neck. Please follow these instructions carefully:  1.  Shower with CHG Soap the night before surgery and the  morning of  Surgery.  2.  If you choose to wash your hair, wash your hair first as usual with your  normal  shampoo.  3.  After you shampoo, rinse your hair and body thoroughly to remove the  shampoo.                           4.  Use CHG as you would any other liquid soap.  You can apply chg directly  to the skin and wash                       Gently with a scrungie or clean washcloth.  5.  Apply the CHG Soap to your body ONLY FROM THE NECK DOWN.   Do not use on face/ open                           Wound or open sores. Avoid contact with eyes, ears mouth and genitals (private parts).                       Wash face,  Genitals (private parts) with your normal soap.             6.  Wash thoroughly, paying special attention to the area where your surgery  will be performed.  7.  Thoroughly rinse your body with warm water from the neck down.  8.  DO NOT shower/wash with your normal soap after using and rinsing off  the CHG Soap.                9.  Pat yourself dry with a clean towel.            10.  Wear clean pajamas.            11.  Place clean sheets on your bed the night of your first shower and do not  sleep  with pets. Day of Surgery : Do not apply any lotions/deodorants the morning of surgery.  Please wear clean clothes to the hospital/surgery center.  FAILURE TO FOLLOW THESE INSTRUCTIONS MAY RESULT IN THE CANCELLATION OF YOUR SURGERY PATIENT SIGNATURE_________________________________  NURSE SIGNATURE__________________________________  ________________________________________________________________________

## 2019-09-23 ENCOUNTER — Ambulatory Visit (HOSPITAL_COMMUNITY)
Admission: RE | Admit: 2019-09-23 | Discharge: 2019-09-23 | Disposition: A | Discharge status: Home / Self Care | Payer: Medicare HMO | Source: Ambulatory Visit | Attending: Vascular Surgery | Admitting: Vascular Surgery

## 2019-09-23 ENCOUNTER — Other Ambulatory Visit (HOSPITAL_COMMUNITY)
Admission: RE | Admit: 2019-09-23 | Discharge: 2019-09-23 | Disposition: A | Discharge status: Home / Self Care | Payer: Medicare HMO | Source: Ambulatory Visit | Attending: Urology | Admitting: Urology

## 2019-09-23 ENCOUNTER — Encounter (HOSPITAL_COMMUNITY): Payer: Self-pay

## 2019-09-23 ENCOUNTER — Encounter (HOSPITAL_COMMUNITY)
Admission: RE | Admit: 2019-09-23 | Discharge: 2019-09-23 | Disposition: A | Discharge status: Home / Self Care | Payer: Medicare HMO | Source: Ambulatory Visit | Attending: Urology | Admitting: Urology

## 2019-09-23 ENCOUNTER — Other Ambulatory Visit: Payer: Self-pay

## 2019-09-23 DIAGNOSIS — Z20822 Contact with and (suspected) exposure to covid-19: Secondary | ICD-10-CM | POA: Diagnosis not present

## 2019-09-23 DIAGNOSIS — I713 Abdominal aortic aneurysm, ruptured, unspecified: Secondary | ICD-10-CM

## 2019-09-23 DIAGNOSIS — Z01818 Encounter for other preprocedural examination: Secondary | ICD-10-CM | POA: Diagnosis not present

## 2019-09-23 DIAGNOSIS — I517 Cardiomegaly: Secondary | ICD-10-CM | POA: Insufficient documentation

## 2019-09-23 DIAGNOSIS — I714 Abdominal aortic aneurysm, without rupture: Secondary | ICD-10-CM | POA: Diagnosis not present

## 2019-09-23 DIAGNOSIS — I700 Atherosclerosis of aorta: Secondary | ICD-10-CM | POA: Diagnosis not present

## 2019-09-23 MED ORDER — IOHEXOL 350 MG/ML SOLN
100.0000 mL | Freq: Once | INTRAVENOUS | Status: AC | PRN
  Administered 2019-09-23: 100 mL via INTRAVENOUS

## 2019-09-23 NOTE — Progress Notes (Signed)
PCP - Allyn Kenner Cardiologist - [name] lov 06-18-19  Chest x-ray -  EKG - 09-24-19 epic Stress Test - 07-02-19 epic ECHO - 06-25-19 epic Cardiac Cath -  CT renal stone study 09-06-19 epic CT angiogram scheduled for 09-23-19  Sleep Study -  CPAP -   Fasting Blood Sugar -  Checks Blood Sugar _____ times a day  Blood Thinner Instructions: Aspirin Instructions: Last Dose:  Anesthesia review: 6.1cm abd. Aneurysm , Stent 2009, Mild to moderate aortic stenosis. Vascular consult  Patient denies shortness of breath, fever, cough and chest pain at PAT appointment   NONE   Patient verbalized understanding of instructions that were given to them at the PAT appointment. Patient was also instructed that they will need to review over the PAT instructions again at home before surgery.

## 2019-09-24 ENCOUNTER — Ambulatory Visit (INDEPENDENT_AMBULATORY_CARE_PROVIDER_SITE_OTHER): Payer: Medicare HMO | Admitting: Vascular Surgery

## 2019-09-24 ENCOUNTER — Inpatient Hospital Stay (HOSPITAL_COMMUNITY): Admission: RE | Admit: 2019-09-24 | Payer: Medicare HMO | Source: Ambulatory Visit

## 2019-09-24 ENCOUNTER — Encounter: Payer: Self-pay | Admitting: Vascular Surgery

## 2019-09-24 ENCOUNTER — Other Ambulatory Visit: Payer: Self-pay

## 2019-09-24 ENCOUNTER — Encounter (HOSPITAL_COMMUNITY)
Admission: RE | Admit: 2019-09-24 | Discharge: 2019-09-24 | Disposition: A | Discharge status: Home / Self Care | Payer: Medicare HMO | Source: Ambulatory Visit | Attending: Urology | Admitting: Urology

## 2019-09-24 VITALS — BP 139/74 | HR 105 | Temp 97.5°F | Ht 66.0 in | Wt 196.0 lb

## 2019-09-24 DIAGNOSIS — I10 Essential (primary) hypertension: Secondary | ICD-10-CM | POA: Diagnosis not present

## 2019-09-24 DIAGNOSIS — Z79899 Other long term (current) drug therapy: Secondary | ICD-10-CM | POA: Insufficient documentation

## 2019-09-24 DIAGNOSIS — I714 Abdominal aortic aneurysm, without rupture, unspecified: Secondary | ICD-10-CM

## 2019-09-24 DIAGNOSIS — E785 Hyperlipidemia, unspecified: Secondary | ICD-10-CM | POA: Diagnosis not present

## 2019-09-24 DIAGNOSIS — K219 Gastro-esophageal reflux disease without esophagitis: Secondary | ICD-10-CM | POA: Diagnosis not present

## 2019-09-24 DIAGNOSIS — D509 Iron deficiency anemia, unspecified: Secondary | ICD-10-CM | POA: Insufficient documentation

## 2019-09-24 DIAGNOSIS — Z01818 Encounter for other preprocedural examination: Secondary | ICD-10-CM | POA: Diagnosis not present

## 2019-09-24 DIAGNOSIS — Z87891 Personal history of nicotine dependence: Secondary | ICD-10-CM | POA: Diagnosis not present

## 2019-09-24 DIAGNOSIS — C679 Malignant neoplasm of bladder, unspecified: Secondary | ICD-10-CM | POA: Diagnosis not present

## 2019-09-24 DIAGNOSIS — I2510 Atherosclerotic heart disease of native coronary artery without angina pectoris: Secondary | ICD-10-CM | POA: Insufficient documentation

## 2019-09-24 LAB — BASIC METABOLIC PANEL
Anion gap: 10 (ref 5–15)
BUN: 12 mg/dL (ref 8–23)
CO2: 32 mmol/L (ref 22–32)
Calcium: 9.4 mg/dL (ref 8.9–10.3)
Chloride: 101 mmol/L (ref 98–111)
Creatinine, Ser: 0.6 mg/dL — ABNORMAL LOW (ref 0.61–1.24)
GFR calc Af Amer: >60 mL/min (ref >60)
GFR calc non Af Amer: >60 mL/min (ref >60)
Glucose, Bld: 120 mg/dL — ABNORMAL HIGH (ref 70–99)
Potassium: 3.9 mmol/L (ref 3.5–5.1)
Sodium: 143 mmol/L (ref 135–145)

## 2019-09-24 LAB — CBC
HCT: 44.6 % (ref 39.0–52.0)
Hemoglobin: 12.9 g/dL — ABNORMAL LOW (ref 13.0–17.0)
MCH: 26.9 pg (ref 26.0–34.0)
MCHC: 28.9 g/dL — ABNORMAL LOW (ref 30.0–36.0)
MCV: 92.9 fL (ref 80.0–100.0)
Platelets: 499 10*3/uL — ABNORMAL HIGH (ref 150–400)
RBC: 4.8 MIL/uL (ref 4.22–5.81)
RDW: 22.4 % — ABNORMAL HIGH (ref 11.5–15.5)
WBC: 12 10*3/uL — ABNORMAL HIGH (ref 4.0–10.5)
nRBC: 0 % (ref 0.0–0.2)

## 2019-09-24 LAB — SARS CORONAVIRUS 2 (TAT 6-24 HRS): SARS Coronavirus 2: NEGATIVE

## 2019-09-24 NOTE — H&P (View-Only) (Signed)
REASON FOR CONSULT:    Abdominal aortic aneurysm consult is requested by the emergency department.  ASSESSMENT & PLAN:   ABDOMINAL AORTIC ANEURYSM: This patient has a 6 cm infrarenal abdominal aortic aneurysm.  The neck however is very short and he is not a candidate for an endovascular repair.  Thus he would require open repair.  I have explained that the risk of an aneurysm this size rupturing is 5 to 10 %/year.  This reason I would recommend elective open repair.  He does have a history of coronary artery disease and underwent PTCA in 2009.  He has been on Plavix.  In addition his echo in January of this year showed mild to moderate aortic stenosis with an aortic valve area of 1.38 cm.  He will need preoperative cardiac clearance by his cardiologist Dr. Johnsie Cancel.  In addition we would like to hold his Plavix for 1 week preoperatively.  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  I explained the the risks associated with the surgery include but are not limited to bleeding, renal failure, MI, graft infection, and bowel ischemia.  I think his risk of major morbidity or mortality is 5%.  He is scheduled to have his bladder tumor resected transurethrally this coming Friday.  Once he is cleared from a cardiac standpoint I think we can schedule him for open repair of his aneurysm.   Deitra Mayo, MD Office: [phone]   HPI:   [name] is a pleasant 79 y.o. male, who had presented with hematuria to an outlying emergency department.  He was found to have a small bladder tumor and is scheduled for transurethral resection of this tumor this coming Friday.  An incidental finding was a 6 cm infrarenal abdominal aortic aneurysm and he was sent for vascular consultation.  He denies any abdominal pain or back pain.  He has no family history of aneurysmal disease.  He has had no previous abdominal  surgery.  Past Medical History:  Diagnosis Date  . Blood transfusion without reported diagnosis   . Coronary artery disease   . GERD (gastroesophageal reflux disease)   . HOH (hard of hearing)   . Hyperlipidemia   . Hypertension   . Iron deficiency anemia 05/14/2018  . Polio    hx of. right arm atrophy    Family History  Problem Relation Age of Onset  . Heart attack Brother 73       died  . Hypertension Mother   . Heart attack Father     SOCIAL HISTORY: Social History   Socioeconomic History  . Marital status: Married    Spouse name: Not on file  . Number of children: 2  . Years of education: Not on file  . Highest education level: Not on file  Occupational History  . Occupation: retired Furniture conservator/restorer  Tobacco Use  . Smoking status: Former Smoker    Packs/day: 1.00    Years: 15.00    Pack years: 15.00    Types: Cigarettes    Quit date: 06/12/2004    Years since quitting: 15.2  . Smokeless tobacco: Never Used  Substance and Sexual Activity  . Alcohol use: No    Alcohol/week: 0.0 standard drinks  . Drug use: No  . Sexual activity: Not Currently    Birth control/protection: None  Other Topics Concern  . Not on file  Social History Narrative  . Not on  file   Social Determinants of Health   Financial Resource Strain:   . Difficulty of Paying Living Expenses:   Food Insecurity:   . Worried About Charity fundraiser in the Last Year:   . Arboriculturist in the Last Year:   Transportation Needs:   . Film/video editor (Medical):   Marland Kitchen Lack of Transportation (Non-Medical):   Physical Activity:   . Days of Exercise per Week:   . Minutes of Exercise per Session:   Stress:   . Feeling of Stress :   Social Connections:   . Frequency of Communication with Friends and Family:   . Frequency of Social Gatherings with Friends and Family:   . Attends Religious Services:   . Active Member of Clubs or Organizations:   . Attends Archivist Meetings:   Marland Kitchen  Marital Status:   Intimate Partner Violence:   . Fear of Current or Ex-Partner:   . Emotionally Abused:   Marland Kitchen Physically Abused:   . Sexually Abused:     No Known Allergies  Current Outpatient Medications  Medication Sig Dispense Refill  . Cholecalciferol (VITAMIN D3 PO) Take 1 capsule by mouth daily.     Marland Kitchen ELDERBERRY PO Take by mouth daily.    . ferrous sulfate 325 (65 FE) MG tablet Take 1 tablet (325 mg total) by mouth 2 (two) times daily with a meal. (Patient not taking: Reported on 09/24/2019)  3  . hydrochlorothiazide (MICROZIDE) 12.5 MG capsule Take 1 capsule (12.5 mg total) by mouth daily as needed (fluid). (Patient not taking: Reported on 09/24/2019)    . levofloxacin (LEVAQUIN) 750 MG tablet Take 750 mg by mouth daily.    . nitroGLYCERIN (NITROSTAT) 0.4 MG SL tablet Place 1 tablet (0.4 mg total) under the tongue every 5 (five) minutes as needed for chest pain. (Patient not taking: Reported on 09/24/2019) 25 tablet 3  . pantoprazole (PROTONIX) 40 MG tablet Take 1 tablet (40 mg total) by mouth daily. Take 30 min before meals (Patient not taking: Reported on 09/24/2019)    . simvastatin (ZOCOR) 40 MG tablet Take 40 mg by mouth at bedtime.      No current facility-administered medications for this visit.    REVIEW OF SYSTEMS:  [X]  denotes positive finding, [ ]  denotes negative finding Cardiac  Comments:  Chest pain or chest pressure:    Shortness of breath upon exertion:    Short of breath when lying flat:    Irregular heart rhythm:        Vascular    Pain in calf, thigh, or hip brought on by ambulation:    Pain in feet at night that wakes you up from your sleep:     Blood clot in your veins:    Leg swelling:         Pulmonary    Oxygen at home:    Productive cough:     Wheezing:         Neurologic    Sudden weakness in arms or legs:     Sudden numbness in arms or legs:     Sudden onset of difficulty speaking or slurred speech:    Temporary loss of vision in one eye:      Problems with dizziness:         Gastrointestinal    Blood in stool:     Vomited blood:         Genitourinary    Burning when urinating:  x   Blood in urine: x       Psychiatric    Major depression:         Hematologic    Bleeding problems:    Problems with blood clotting too easily:        Skin    Rashes or ulcers:        Constitutional    Fever or chills:     PHYSICAL EXAM:   Vitals:   09/24/19 0840  BP: 139/74  Pulse: (!) 105  Temp: (!) 97.5 F (36.4 C)  TempSrc: Temporal  SpO2: 92%  Weight: 196 lb (88.9 kg)  Height: 5\' 6"  (1.676 m)   Body mass index is 31.64 kg/m.  GENERAL: The patient is a well-nourished male, in no acute distress. The vital signs are documented above. CARDIAC: There is a regular rate and rhythm. VASCULAR: I do not detect carotid bruits. On the right side he has a palpable femoral, dorsalis pedis, and posterior tibial pulse.  He has a biphasic dorsalis pedis and posterior tibial signal with the Doppler. On the left side he has a palpable femoral pulse with a palpable dorsalis pedis pulse.  I cannot palpate a posterior tibial pulse.  However, he has biphasic signals in the dorsalis pedis and posterior tibial positions. He has mild bilateral lower extremity swelling and some hyperpigmentation consistent with chronic venous insufficiency. PULMONARY: There is good air exchange bilaterally without wheezing or rales. ABDOMEN: Soft and non-tender with normal pitched bowel sounds.  His aneurysm is nontender. MUSCULOSKELETAL: There are no major deformities or cyanosis. NEUROLOGIC: He has right upper extremity weakness related to polio.  SKIN: There are no ulcers or rashes noted. PSYCHIATRIC: The patient has a normal affect.  DATA:    CT ABDOMEN PELVIS: I have reviewed his CT of the abdomen and pelvis that was done on 09/23/2019.  He has a 6 cm aneurysm which extends adjacent to the renals.  The neck is less than 1 cm and therefore he is not a  candidate for endovascular repair of his aneurysm.  He has significant calcific disease in his aorta and iliacs however I think we could safely clamp between the right renal and the accessory left renal to allow adequate exposure to sew the proximal anastomosis.  He does have some mild narrowing in the right common femoral artery.  LABS: His creatinine is 0.62.  White blood cell count 11.5.  Hemoglobin 11.8.  Platelets 290,000.  His LFTs were normal.

## 2019-09-24 NOTE — Progress Notes (Signed)
REASON FOR CONSULT:    Abdominal aortic aneurysm consult is requested by the emergency department.  ASSESSMENT & PLAN:   ABDOMINAL AORTIC ANEURYSM: This patient has a 6 cm infrarenal abdominal aortic aneurysm.  The neck however is very short and he is not a candidate for an endovascular repair.  Thus he would require open repair.  I have explained that the risk of an aneurysm this size rupturing is 5 to 10 %/year.  This reason I would recommend elective open repair.  He does have a history of coronary artery disease and underwent PTCA in 2009.  He has been on Plavix.  In addition his echo in January of this year showed mild to moderate aortic stenosis with an aortic valve area of 1.38 cm.  He will need preoperative cardiac clearance by his cardiologist Dr. Johnsie Cancel.  In addition we would like to hold his Plavix for 1 week preoperatively.  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  I explained the the risks associated with the surgery include but are not limited to bleeding, renal failure, MI, graft infection, and bowel ischemia.  I think his risk of major morbidity or mortality is 5%.  He is scheduled to have his bladder tumor resected transurethrally this coming Friday.  Once he is cleared from a cardiac standpoint I think we can schedule him for open repair of his aneurysm.   Deitra Mayo, MD Office: [phone]   HPI:   [name] is a pleasant 79 y.o. male, who had presented with hematuria to an outlying emergency department.  He was found to have a small bladder tumor and is scheduled for transurethral resection of this tumor this coming Friday.  An incidental finding was a 6 cm infrarenal abdominal aortic aneurysm and he was sent for vascular consultation.  He denies any abdominal pain or back pain.  He has no family history of aneurysmal disease.  He has had no previous abdominal  surgery.  Past Medical History:  Diagnosis Date  . Blood transfusion without reported diagnosis   . Coronary artery disease   . GERD (gastroesophageal reflux disease)   . HOH (hard of hearing)   . Hyperlipidemia   . Hypertension   . Iron deficiency anemia 05/14/2018  . Polio    hx of. right arm atrophy    Family History  Problem Relation Age of Onset  . Heart attack Brother 24       died  . Hypertension Mother   . Heart attack Father     SOCIAL HISTORY: Social History   Socioeconomic History  . Marital status: Married    Spouse name: Not on file  . Number of children: 2  . Years of education: Not on file  . Highest education level: Not on file  Occupational History  . Occupation: retired Furniture conservator/restorer  Tobacco Use  . Smoking status: Former Smoker    Packs/day: 1.00    Years: 15.00    Pack years: 15.00    Types: Cigarettes    Quit date: 06/12/2004    Years since quitting: 15.2  . Smokeless tobacco: Never Used  Substance and Sexual Activity  . Alcohol use: No    Alcohol/week: 0.0 standard drinks  . Drug use: No  . Sexual activity: Not Currently    Birth control/protection: None  Other Topics Concern  . Not on file  Social History Narrative  . Not on  file   Social Determinants of Health   Financial Resource Strain:   . Difficulty of Paying Living Expenses:   Food Insecurity:   . Worried About Charity fundraiser in the Last Year:   . Arboriculturist in the Last Year:   Transportation Needs:   . Film/video editor (Medical):   Marland Kitchen Lack of Transportation (Non-Medical):   Physical Activity:   . Days of Exercise per Week:   . Minutes of Exercise per Session:   Stress:   . Feeling of Stress :   Social Connections:   . Frequency of Communication with Friends and Family:   . Frequency of Social Gatherings with Friends and Family:   . Attends Religious Services:   . Active Member of Clubs or Organizations:   . Attends Archivist Meetings:   Marland Kitchen  Marital Status:   Intimate Partner Violence:   . Fear of Current or Ex-Partner:   . Emotionally Abused:   Marland Kitchen Physically Abused:   . Sexually Abused:     No Known Allergies  Current Outpatient Medications  Medication Sig Dispense Refill  . Cholecalciferol (VITAMIN D3 PO) Take 1 capsule by mouth daily.     Marland Kitchen ELDERBERRY PO Take by mouth daily.    . ferrous sulfate 325 (65 FE) MG tablet Take 1 tablet (325 mg total) by mouth 2 (two) times daily with a meal. (Patient not taking: Reported on 09/24/2019)  3  . hydrochlorothiazide (MICROZIDE) 12.5 MG capsule Take 1 capsule (12.5 mg total) by mouth daily as needed (fluid). (Patient not taking: Reported on 09/24/2019)    . levofloxacin (LEVAQUIN) 750 MG tablet Take 750 mg by mouth daily.    . nitroGLYCERIN (NITROSTAT) 0.4 MG SL tablet Place 1 tablet (0.4 mg total) under the tongue every 5 (five) minutes as needed for chest pain. (Patient not taking: Reported on 09/24/2019) 25 tablet 3  . pantoprazole (PROTONIX) 40 MG tablet Take 1 tablet (40 mg total) by mouth daily. Take 30 min before meals (Patient not taking: Reported on 09/24/2019)    . simvastatin (ZOCOR) 40 MG tablet Take 40 mg by mouth at bedtime.      No current facility-administered medications for this visit.    REVIEW OF SYSTEMS:  [X]  denotes positive finding, [ ]  denotes negative finding Cardiac  Comments:  Chest pain or chest pressure:    Shortness of breath upon exertion:    Short of breath when lying flat:    Irregular heart rhythm:        Vascular    Pain in calf, thigh, or hip brought on by ambulation:    Pain in feet at night that wakes you up from your sleep:     Blood clot in your veins:    Leg swelling:         Pulmonary    Oxygen at home:    Productive cough:     Wheezing:         Neurologic    Sudden weakness in arms or legs:     Sudden numbness in arms or legs:     Sudden onset of difficulty speaking or slurred speech:    Temporary loss of vision in one eye:      Problems with dizziness:         Gastrointestinal    Blood in stool:     Vomited blood:         Genitourinary    Burning when urinating:  x   Blood in urine: x       Psychiatric    Major depression:         Hematologic    Bleeding problems:    Problems with blood clotting too easily:        Skin    Rashes or ulcers:        Constitutional    Fever or chills:     PHYSICAL EXAM:   Vitals:   09/24/19 0840  BP: 139/74  Pulse: (!) 105  Temp: (!) 97.5 F (36.4 C)  TempSrc: Temporal  SpO2: 92%  Weight: 196 lb (88.9 kg)  Height: 5\' 6"  (1.676 m)   Body mass index is 31.64 kg/m.  GENERAL: The patient is a well-nourished male, in no acute distress. The vital signs are documented above. CARDIAC: There is a regular rate and rhythm. VASCULAR: I do not detect carotid bruits. On the right side he has a palpable femoral, dorsalis pedis, and posterior tibial pulse.  He has a biphasic dorsalis pedis and posterior tibial signal with the Doppler. On the left side he has a palpable femoral pulse with a palpable dorsalis pedis pulse.  I cannot palpate a posterior tibial pulse.  However, he has biphasic signals in the dorsalis pedis and posterior tibial positions. He has mild bilateral lower extremity swelling and some hyperpigmentation consistent with chronic venous insufficiency. PULMONARY: There is good air exchange bilaterally without wheezing or rales. ABDOMEN: Soft and non-tender with normal pitched bowel sounds.  His aneurysm is nontender. MUSCULOSKELETAL: There are no major deformities or cyanosis. NEUROLOGIC: He has right upper extremity weakness related to polio.  SKIN: There are no ulcers or rashes noted. PSYCHIATRIC: The patient has a normal affect.  DATA:    CT ABDOMEN PELVIS: I have reviewed his CT of the abdomen and pelvis that was done on 09/23/2019.  He has a 6 cm aneurysm which extends adjacent to the renals.  The neck is less than 1 cm and therefore he is not a  candidate for endovascular repair of his aneurysm.  He has significant calcific disease in his aorta and iliacs however I think we could safely clamp between the right renal and the accessory left renal to allow adequate exposure to sew the proximal anastomosis.  He does have some mild narrowing in the right common femoral artery.  LABS: His creatinine is 0.62.  White blood cell count 11.5.  Hemoglobin 11.8.  Platelets 290,000.  His LFTs were normal.

## 2019-09-25 MED ORDER — GENTAMICIN SULFATE 40 MG/ML IJ SOLN
5.0000 mg/kg | INTRAVENOUS | Status: AC
  Administered 2019-09-26: 370 mg via INTRAVENOUS
  Filled 2019-09-25: qty 9.25

## 2019-09-25 NOTE — Progress Notes (Addendum)
Anesthesia Chart Review   Case: [id number] Date/Time: 09/26/19 1030   Procedures:      TRANSURETHRAL RESECTION OF BLADDER TUMOR (TURBT) (N/A ) - 1 HR     CYSTOSCOPY WITH RETROGRADE PYELOGRAM (Bilateral )   Anesthesia type: General   Pre-op diagnosis: BLADDER CANCER   Location: [facility] 07 / [facility]   Surgeons: Alexis Frock, MD      DISCUSSION:78 y.o. former smoker (15 pack years, quit 06/12/04) with h/o HLD, HTN, GERD, CAD (LAD stent 2009), Mild to moderate aortic valve stenosis (Aortic valve mean gradient measures 17.0 mmHg. Aortic valve peak gradient measures 33.4 mmHg. Aortic valve area, by VTI measures 1.38 cm),  Polio with right arm paralysis, bladder cancer scheduled for above procedure 09/26/2019 with Dr. Alexis Frock.   Pt with recent admission 3/27-3/28/2021 with hematuria and urinary retention.  Diagnosed with new bladder mass.  Incidental finding of 6.1 cm AAA on CT.  Vascular consulted, pt asymptomatic, outpatient follow up recommended.    Pt seen by vascular surgeon, Dr. Deitra Mayo, 09/24/2019.  Per OV note elective open repair recommended, pt will need cardiac clearance before proceeding.    Pt last seen by cardiologist, Dr. Johnsie Cancel, 06/18/2019.  Stress test and echo ordered at this visit.  EF with 65-70%, mild to moderate AS noted on Echo.  Stress test low risk study.  6 month follow up recommended with repeat echo in 1 year.    Discussed with Dr. Tobias Alexander.  Anticipate pt can proceed with planned procedure barring acute status change.   VS: BP 128/80 (BP Location: Left Arm)   Pulse (!) 58   Temp 36.7 C (Oral)   Resp 18   Ht 5\' 6"  (1.676 m)   Wt 88.7 kg   SpO2 95%   BMI 31.56 kg/m   PROVIDERS: Celene Squibb, MD is PCP   [name], MD is Cardiologist  Deitra Mayo, MD is Vascular Surgeon LABS: Labs reviewed: Acceptable for surgery. (all labs ordered are listed, but only abnormal results are displayed)  Labs Reviewed  BASIC METABOLIC PANEL -  Abnormal; Notable for the following components:      Result Value   Glucose, Bld 120 (*)    Creatinine, Ser 0.60 (*)    All other components within normal limits  CBC - Abnormal; Notable for the following components:   WBC 12.0 (*)    Hemoglobin 12.9 (*)    MCHC 28.9 (*)    RDW 22.4 (*)    Platelets 499 (*)    All other components within normal limits     IMAGES: CT Angio Abdomen Pelvis 09/23/2019 IMPRESSION: VASCULAR  1. Stable uncomplicated large infrarenal abdominal aortic aneurysm measuring approximately 6 cm in diameter. No significant mural thrombus. Aortic aneurysm NOS (ICD10-I71.9). 2. Duplicated right renal arteries with approximately 50% luminal narrowing involving the dominant right renal artery and approximately 50% luminal narrowing of the solitary left renal artery without associated asymmetric renal enhancement or atrophy. 3. Suspected approximately 50% luminal narrowing of the right common femoral artery. 4. Incidentally noted common origin of the celiac and SMA without a hemodynamically significant stenosis. 5. Cardiomegaly with apparent calcifications with the aortic valve leaflets as could be seen in the setting of aortic stenosis. Further evaluation with cardiac echo could be performed as clinically indicated. 6. Coronary artery calcifications. Aortic Atherosclerosis (ICD10-I70.0).  NON-VASCULAR  1. Hepatic steatosis with mild nodularity hepatic contour as could be seen in the setting of early cirrhotic change. Correlation with LFTs is  advised. 2. Apparent urinary bladder wall thickening could be attributable to underdistention in the presence of a Foley catheter though a cystitis could have a similar appearance. 3. Bilateral L5 pars defects with associated grade 1 anterolisthesis of L5 upon S1.  EKG: 09/24/2019 Rate 56 bpm  Sinus bradycardia Right bundle branch block  New since previous tracing   CV: Echo 06/25/2019 IMPRESSIONS   1.  Left ventricular ejection fraction, by visual estimation, is 65 to  70%. The left ventricle has hyperdynamic function. There is no left  ventricular hypertrophy.  2. Left ventricular diastolic parameters are indeterminate.  3. The left ventricle has no regional wall motion abnormalities.  4. Global right ventricle has normal systolic function.The right  ventricular size is normal. No increase in right ventricular wall  thickness.  5. Left atrial size was mildly dilated.  6. Right atrial size was mildly dilated.  7. Mild to moderate mitral annular calcification.  8. The mitral valve is normal in structure. No evidence of mitral valve  regurgitation. No evidence of mitral stenosis.  9. The tricuspid valve is not well visualized.  10. The aortic valve is tricuspid. Aortic valve regurgitation is not  visualized. Mild to moderate aortic valve stenosis. Aortic valve mean  gradient measures 17.0 mmHg. Aortic valve peak gradient measures 33.4  mmHg. Aortic valve area, by VTI measures 1.38  cm.  11. Pulmonic regurgitation is mild.  12. The pulmonic valve was not well visualized. Pulmonic valve  regurgitation is mild.  13. The interatrial septum was not well visualized. Past Medical History:  Diagnosis Date  . Blood transfusion without reported diagnosis   . Coronary artery disease   . GERD (gastroesophageal reflux disease)   . HOH (hard of hearing)   . Hyperlipidemia   . Hypertension   . Iron deficiency anemia 05/14/2018  . Polio    hx of. right arm atrophy    Past Surgical History:  Procedure Laterality Date  . CARDIAC CATHETERIZATION  07-09-2007  . CHOLECYSTECTOMY    . COLONOSCOPY WITH PROPOFOL N/A 05/31/2018   Procedure: COLONOSCOPY WITH PROPOFOL;  Surgeon: Rogene Houston, MD;  [facility];  Service: Endoscopy;  Laterality: N/A;  1:45  . CORONARY STENT PLACEMENT     x3 vessels bifurcated single vessel  . ESOPHAGOGASTRODUODENOSCOPY (EGD) WITH PROPOFOL N/A  05/31/2018   Procedure: ESOPHAGOGASTRODUODENOSCOPY (EGD) WITH PROPOFOL;  Surgeon: Rogene Houston, MD;  [facility];  Service: Endoscopy;  Laterality: N/A;  . ESOPHAGOGASTRODUODENOSCOPY (EGD) WITH PROPOFOL N/A 08/08/2019   Procedure: ESOPHAGOGASTRODUODENOSCOPY (EGD) WITH PROPOFOL;  Surgeon: Rogene Houston, MD;  [facility];  Service: Endoscopy;  Laterality: N/A;  . KNEE SURGERY     Left knee  steel rod  . POLYPECTOMY  05/31/2018   Procedure: POLYPECTOMY;  Surgeon: Rogene Houston, MD;  [facility];  Service: Endoscopy;;  sigmoid    MEDICATIONS: . Cholecalciferol (VITAMIN D3 PO)  . ELDERBERRY PO  . ferrous sulfate 325 (65 FE) MG tablet  . hydrochlorothiazide (MICROZIDE) 12.5 MG capsule  . levofloxacin (LEVAQUIN) 750 MG tablet  . nitroGLYCERIN (NITROSTAT) 0.4 MG SL tablet  . pantoprazole (PROTONIX) 40 MG tablet  . simvastatin (ZOCOR) 40 MG tablet   No current facility-administered medications for this encounter.   Derrill Memo ON 09/26/2019] gentamicin (GARAMYCIN) 370 mg in dextrose 5 % 100 mL IVPB     Maia Plan [facility] Pre[facility]Surgical Testing [phone] 09/25/19  10:42 AM

## 2019-09-26 ENCOUNTER — Other Ambulatory Visit: Payer: Self-pay

## 2019-09-26 ENCOUNTER — Encounter (HOSPITAL_COMMUNITY): Payer: Self-pay | Admitting: Urology

## 2019-09-26 ENCOUNTER — Ambulatory Visit (HOSPITAL_COMMUNITY): Payer: Medicare HMO | Admitting: Physician Assistant

## 2019-09-26 ENCOUNTER — Ambulatory Visit (HOSPITAL_COMMUNITY): Payer: Medicare HMO

## 2019-09-26 ENCOUNTER — Encounter (HOSPITAL_COMMUNITY)
Admission: RE | Disposition: A | Discharge status: Home / Self Care | Payer: Self-pay | Source: Home / Self Care | Attending: Urology

## 2019-09-26 ENCOUNTER — Ambulatory Visit (HOSPITAL_COMMUNITY)
Admission: RE | Admit: 2019-09-26 | Discharge: 2019-09-26 | Disposition: A | Discharge status: Home / Self Care | Payer: Medicare HMO | Attending: Urology | Admitting: Urology

## 2019-09-26 DIAGNOSIS — I10 Essential (primary) hypertension: Secondary | ICD-10-CM | POA: Diagnosis not present

## 2019-09-26 DIAGNOSIS — I714 Abdominal aortic aneurysm, without rupture: Secondary | ICD-10-CM | POA: Insufficient documentation

## 2019-09-26 DIAGNOSIS — Z7982 Long term (current) use of aspirin: Secondary | ICD-10-CM | POA: Diagnosis not present

## 2019-09-26 DIAGNOSIS — Z955 Presence of coronary angioplasty implant and graft: Secondary | ICD-10-CM | POA: Insufficient documentation

## 2019-09-26 DIAGNOSIS — N3020 Other chronic cystitis without hematuria: Secondary | ICD-10-CM | POA: Diagnosis not present

## 2019-09-26 DIAGNOSIS — C679 Malignant neoplasm of bladder, unspecified: Secondary | ICD-10-CM | POA: Diagnosis not present

## 2019-09-26 DIAGNOSIS — Z7902 Long term (current) use of antithrombotics/antiplatelets: Secondary | ICD-10-CM | POA: Insufficient documentation

## 2019-09-26 DIAGNOSIS — D500 Iron deficiency anemia secondary to blood loss (chronic): Secondary | ICD-10-CM | POA: Diagnosis not present

## 2019-09-26 DIAGNOSIS — Z8249 Family history of ischemic heart disease and other diseases of the circulatory system: Secondary | ICD-10-CM | POA: Diagnosis not present

## 2019-09-26 DIAGNOSIS — C678 Malignant neoplasm of overlapping sites of bladder: Secondary | ICD-10-CM | POA: Diagnosis not present

## 2019-09-26 DIAGNOSIS — I2510 Atherosclerotic heart disease of native coronary artery without angina pectoris: Secondary | ICD-10-CM | POA: Insufficient documentation

## 2019-09-26 DIAGNOSIS — D090 Carcinoma in situ of bladder: Secondary | ICD-10-CM | POA: Diagnosis not present

## 2019-09-26 DIAGNOSIS — Z87891 Personal history of nicotine dependence: Secondary | ICD-10-CM | POA: Diagnosis not present

## 2019-09-26 HISTORY — PX: TRANSURETHRAL RESECTION OF BLADDER TUMOR: SHX2575

## 2019-09-26 HISTORY — PX: CYSTOSCOPY W/ RETROGRADES: SHX1426

## 2019-09-26 SURGERY — TURBT (TRANSURETHRAL RESECTION OF BLADDER TUMOR)
Anesthesia: General

## 2019-09-26 MED ORDER — DEXAMETHASONE SODIUM PHOSPHATE 10 MG/ML IJ SOLN
INTRAMUSCULAR | Status: DC | PRN
  Administered 2019-09-26: 10 mg via INTRAVENOUS

## 2019-09-26 MED ORDER — DEXAMETHASONE SODIUM PHOSPHATE 10 MG/ML IJ SOLN
INTRAMUSCULAR | Status: AC
  Filled 2019-09-26: qty 1

## 2019-09-26 MED ORDER — LACTATED RINGERS IV SOLN: INTRAVENOUS | Status: DC

## 2019-09-26 MED ORDER — FENTANYL CITRATE (PF) 100 MCG/2ML IJ SOLN
INTRAMUSCULAR | Status: AC
  Filled 2019-09-26: qty 2

## 2019-09-26 MED ORDER — IOHEXOL 300 MG/ML  SOLN
INTRAMUSCULAR | Status: DC | PRN
  Administered 2019-09-26: 40 mL

## 2019-09-26 MED ORDER — ONDANSETRON HCL 4 MG/2ML IJ SOLN
INTRAMUSCULAR | Status: DC | PRN
  Administered 2019-09-26: 4 mg via INTRAVENOUS

## 2019-09-26 MED ORDER — HYDROMORPHONE HCL 1 MG/ML IJ SOLN
INTRAMUSCULAR | Status: AC
  Administered 2019-09-26: 12:00:00 0.5 mg via INTRAVENOUS
  Filled 2019-09-26: qty 1

## 2019-09-26 MED ORDER — LABETALOL HCL 5 MG/ML IV SOLN: 5.0000 mg | INTRAVENOUS | Status: DC | PRN

## 2019-09-26 MED ORDER — PROPOFOL 10 MG/ML IV BOLUS
INTRAVENOUS | Status: AC
  Filled 2019-09-26: qty 20

## 2019-09-26 MED ORDER — ONDANSETRON HCL 4 MG/2ML IJ SOLN: 4.0000 mg | Freq: Once | INTRAMUSCULAR | Status: DC | PRN

## 2019-09-26 MED ORDER — LABETALOL HCL 5 MG/ML IV SOLN
INTRAVENOUS | Status: AC
  Administered 2019-09-26: 13:00:00 5 mg via INTRAVENOUS
  Filled 2019-09-26: qty 4

## 2019-09-26 MED ORDER — FENTANYL CITRATE (PF) 100 MCG/2ML IJ SOLN
INTRAMUSCULAR | Status: DC | PRN
  Administered 2019-09-26 (×2): 25 ug via INTRAVENOUS
  Administered 2019-09-26 (×2): 50 ug via INTRAVENOUS

## 2019-09-26 MED ORDER — SODIUM CHLORIDE 0.9 % IR SOLN
Status: DC | PRN
  Administered 2019-09-26: 6000 mL via INTRAVESICAL

## 2019-09-26 MED ORDER — TAMSULOSIN HCL 0.4 MG PO CAPS: 0.4000 mg | ORAL_CAPSULE | Freq: Every day | ORAL | 11 refills | Status: DC | PRN

## 2019-09-26 MED ORDER — HYDROMORPHONE HCL 1 MG/ML IJ SOLN
0.2500 mg | INTRAMUSCULAR | Status: DC | PRN
  Administered 2019-09-26 (×2): 0.5 mg via INTRAVENOUS
  Administered 2019-09-26: 0.25 mg via INTRAVENOUS

## 2019-09-26 MED ORDER — LABETALOL HCL 5 MG/ML IV SOLN
5.0000 mg | Freq: Once | INTRAVENOUS | Status: AC
  Administered 2019-09-26: 13:00:00 5 mg via INTRAVENOUS

## 2019-09-26 MED ORDER — TRAMADOL HCL 50 MG PO TABS: 50.0000 mg | ORAL_TABLET | Freq: Four times a day (QID) | ORAL | 0 refills | Status: DC | PRN

## 2019-09-26 MED ORDER — HYDROMORPHONE HCL 1 MG/ML IJ SOLN
INTRAMUSCULAR | Status: AC
  Administered 2019-09-26: 12:00:00 0.25 mg via INTRAVENOUS
  Filled 2019-09-26: qty 1

## 2019-09-26 MED ORDER — PROPOFOL 10 MG/ML IV BOLUS
INTRAVENOUS | Status: DC | PRN
  Administered 2019-09-26: 150 mg via INTRAVENOUS

## 2019-09-26 MED ORDER — MEPERIDINE HCL 50 MG/ML IJ SOLN: 6.2500 mg | INTRAMUSCULAR | Status: DC | PRN

## 2019-09-26 MED ORDER — LIDOCAINE 2% (20 MG/ML) 5 ML SYRINGE
INTRAMUSCULAR | Status: DC | PRN
  Administered 2019-09-26: 100 mg via INTRAVENOUS

## 2019-09-26 MED ORDER — LIDOCAINE 2% (20 MG/ML) 5 ML SYRINGE
INTRAMUSCULAR | Status: AC
  Filled 2019-09-26: qty 5

## 2019-09-26 MED ORDER — ONDANSETRON HCL 4 MG/2ML IJ SOLN
INTRAMUSCULAR | Status: AC
  Filled 2019-09-26: qty 2

## 2019-09-26 SURGICAL SUPPLY — 30 items
BAG DRN RND TRDRP ANRFLXCHMBR (UROLOGICAL SUPPLIES)
BAG URINE DRAIN 2000ML AR STRL (UROLOGICAL SUPPLIES) IMPLANT
BAG URO CATCHER STRL LF (MISCELLANEOUS) ×3 IMPLANT
BASKET LASER NITINOL 1.9FR (BASKET) IMPLANT
BSKT STON RTRVL 120 1.9FR (BASKET)
CATH INTERMIT  6FR 70CM (CATHETERS) ×3 IMPLANT
CLOTH BEACON ORANGE TIMEOUT ST (SAFETY) ×3 IMPLANT
ELECT REM PT RETURN 15FT ADLT (MISCELLANEOUS) ×3 IMPLANT
EVACUATOR MICROVAS BLADDER (UROLOGICAL SUPPLIES) IMPLANT
EXTRACTOR STONE 1.7FRX115CM (UROLOGICAL SUPPLIES) IMPLANT
FIBER LASER FLEXIVA 1000 (UROLOGICAL SUPPLIES) IMPLANT
FIBER LASER FLEXIVA 365 (UROLOGICAL SUPPLIES) IMPLANT
FIBER LASER FLEXIVA 550 (UROLOGICAL SUPPLIES) IMPLANT
FIBER LASER TRAC TIP (UROLOGICAL SUPPLIES) IMPLANT
GLOVE BIOGEL M STRL SZ7.5 (GLOVE) ×3 IMPLANT
GOWN STRL REUS W/TWL LRG LVL3 (GOWN DISPOSABLE) ×3 IMPLANT
GUIDEWIRE ANG ZIPWIRE 038X150 (WIRE) ×3 IMPLANT
GUIDEWIRE STR DUAL SENSOR (WIRE) ×3 IMPLANT
KIT TURNOVER KIT A (KITS) IMPLANT
LOOP CUT BIPOLAR 24F LRG (ELECTROSURGICAL) IMPLANT
MANIFOLD NEPTUNE II (INSTRUMENTS) ×3 IMPLANT
PACK CYSTO (CUSTOM PROCEDURE TRAY) ×3 IMPLANT
PENCIL SMOKE EVACUATOR (MISCELLANEOUS) IMPLANT
SHEATH URETERAL 12FRX28CM (UROLOGICAL SUPPLIES) IMPLANT
SHEATH URETERAL 12FRX35CM (MISCELLANEOUS) IMPLANT
SYR TOOMEY IRRIG 70ML (MISCELLANEOUS)
SYRINGE TOOMEY IRRIG 70ML (MISCELLANEOUS) IMPLANT
TUBE FEEDING 8FR 16IN STR KANG (MISCELLANEOUS) ×3 IMPLANT
TUBING CONNECTING 10 (TUBING) ×3 IMPLANT
TUBING UROLOGY SET (TUBING) ×3 IMPLANT

## 2019-09-26 NOTE — Anesthesia Preprocedure Evaluation (Signed)
Anesthesia Evaluation  Patient identified by MRN, date of birth, ID band Patient awake    Reviewed: Allergy & Precautions, NPO status , Patient's Chart, lab work & pertinent test results  Airway Mallampati: I  TM Distance: >3 FB Neck ROM: Full    Dental   Pulmonary former smoker,    Pulmonary exam normal        Cardiovascular hypertension, Pt. on medications + CAD  Normal cardiovascular exam     Neuro/Psych    GI/Hepatic GERD  Medicated and Controlled,  Endo/Other    Renal/GU      Musculoskeletal   Abdominal   Peds  Hematology   Anesthesia Other Findings   Reproductive/Obstetrics                             Anesthesia Physical Anesthesia Plan  ASA: II  Anesthesia Plan: General   Post-op Pain Management:    Induction: Intravenous  PONV Risk Score and Plan: 2  Airway Management Planned: LMA  Additional Equipment:   Intra-op Plan:   Post-operative Plan: Extubation in OR  Informed Consent: I have reviewed the patients History and Physical, chart, labs and discussed the procedure including the risks, benefits and alternatives for the proposed anesthesia with the patient or authorized representative who has indicated his/her understanding and acceptance.       Plan Discussed with: CRNA and Surgeon  Anesthesia Plan Comments:         Anesthesia Quick Evaluation

## 2019-09-26 NOTE — Transfer of Care (Signed)
Immediate Anesthesia Transfer of Care Note  Patient: [name]  Procedure(s) Performed: TRANSURETHRAL RESECTION OF BLADDER TUMOR (TURBT) (N/A ) CYSTOSCOPY WITH RETROGRADE PYELOGRAM (Bilateral )  Patient Location: PACU  Anesthesia Type:General  Level of Consciousness: drowsy  Airway & Oxygen Therapy: Patient Spontanous Breathing and Patient connected to face mask oxygen  Post-op Assessment: Report given to RN and Post -op Vital signs reviewed and stable  Post vital signs: Reviewed and stable  Last Vitals:  Vitals Value Taken Time  BP 167/93 09/26/19 1141  Temp    Pulse 92 09/26/19 1141  Resp 17 09/26/19 1141  SpO2 100 % 09/26/19 1141  Vitals shown include unvalidated device data.  Last Pain:  Vitals:   09/26/19 0926  TempSrc:   PainSc: 0-No pain      Patients Stated Pain Goal: 4 ([id number])  Complications: No apparent anesthesia complications

## 2019-09-26 NOTE — Discharge Instructions (Signed)
1 - You may have urinary urgency (bladder spasms) and bloody urine on / off for up to 2 weeks.  This is normal.  2 - Call MD or go to ER for fever >102, severe pain / nausea / vomiting not relieved by medications, or acute change in medical status  

## 2019-09-26 NOTE — H&P (Signed)
[name] is an 79 y.o. male.    Chief Complaint: Pre-OP Transurethral Resection of Bladder Tumor, Retrogrades, Possible Stenting  HPI:   1 - Bladder Neoplasm - 4cm Rt base neoplastm by on=contrast CT 08/2019 on eval gross hematuria. NO adenopathy NO tissue DX yet. Cysto 08/2019 confirms small rt sided tumor.   2 - Clot Urinary REtention - catheter placed 08/2019 for clot retention in setting of ASA/Plavix and bladder cancer. Blood thinners now held.   PMH sig fro CAD/Stent/Plavix (now not limiting, follows [name] cards). 6cm AAA (eval by Vascular [name] recs open repair). No prior abd surgeries. His PCP is Benjamin Cahill MD in [place].   Today " [name] " is seen to proceed with cysto, retrogrades, TURBT for new bladder cancer. No interval fevers, C19 screen negative.   Past Medical History:  Diagnosis Date  . Blood transfusion without reported diagnosis   . Coronary artery disease   . GERD (gastroesophageal reflux disease)   . HOH (hard of hearing)   . Hyperlipidemia   . Hypertension   . Iron deficiency anemia 05/14/2018  . Polio    hx of. right arm atrophy    Past Surgical History:  Procedure Laterality Date  . CARDIAC CATHETERIZATION  07-09-2007  . CHOLECYSTECTOMY    . COLONOSCOPY WITH PROPOFOL N/A 05/31/2018   Procedure: COLONOSCOPY WITH PROPOFOL;  Surgeon: Benjamin Houston, MD;  [facility];  Service: Endoscopy;  Laterality: N/A;  1:45  . CORONARY STENT PLACEMENT     x3 vessels bifurcated single vessel  . ESOPHAGOGASTRODUODENOSCOPY (EGD) WITH PROPOFOL N/A 05/31/2018   Procedure: ESOPHAGOGASTRODUODENOSCOPY (EGD) WITH PROPOFOL;  Surgeon: Benjamin Houston, MD;  [facility];  Service: Endoscopy;  Laterality: N/A;  . ESOPHAGOGASTRODUODENOSCOPY (EGD) WITH PROPOFOL N/A 08/08/2019   Procedure: ESOPHAGOGASTRODUODENOSCOPY (EGD) WITH PROPOFOL;  Surgeon: Benjamin Houston, MD;  [facility];  Service: Endoscopy;  Laterality: N/A;  . KNEE SURGERY      Left knee  steel rod  . POLYPECTOMY  05/31/2018   Procedure: POLYPECTOMY;  Surgeon: Benjamin Houston, MD;  [facility];  Service: Endoscopy;;  sigmoid    Family History  Problem Relation Age of Onset  . Heart attack Brother 4       died  . Hypertension Mother   . Heart attack Father    Social History:  reports that he quit smoking about 15 years ago. His smoking use included cigarettes. He has a 15.00 pack-year smoking history. He has never used smokeless tobacco. He reports that he does not drink alcohol or use drugs.  Allergies: No Known Allergies  No medications prior to admission.    Results for orders placed or performed during the hospital encounter of 09/24/19 (from the past 48 hour(s))  Basic metabolic panel     Status: Abnormal   Collection Time: 09/24/19 10:27 AM  Result Value Ref Range   Sodium 143 135 - 145 mmol/L   Potassium 3.9 3.5 - 5.1 mmol/L   Chloride 101 98 - 111 mmol/L   CO2 32 22 - 32 mmol/L   Glucose, Bld 120 (H) 70 - 99 mg/dL    Comment: Glucose reference range applies only to samples taken after fasting for at least 8 hours.   BUN 12 8 - 23 mg/dL   Creatinine, Ser 0.60 (L) 0.61 - 1.24 mg/dL   Calcium 9.4 8.9 - 10.3 mg/dL   GFR calc non Af Amer >60 >60 mL/min  GFR calc Af Amer >60 >60 mL/min   Anion gap 10 5 - 15    Comment: Performed at [facility], [place] [address], [place], [place] [address]  CBC     Status: Abnormal   Collection Time: 09/24/19 10:27 AM  Result Value Ref Range   WBC 12.0 (H) 4.0 - 10.5 K/uL   RBC 4.80 4.22 - 5.81 MIL/uL   Hemoglobin 12.9 (L) 13.0 - 17.0 g/dL   HCT 44.6 39.0 - 52.0 %   MCV 92.9 80.0 - 100.0 fL   MCH 26.9 26.0 - 34.0 pg   MCHC 28.9 (L) 30.0 - 36.0 g/dL   RDW 22.4 (H) 11.5 - 15.5 %   Platelets 499 (H) 150 - 400 K/uL   nRBC 0.0 0.0 - 0.2 %    Comment: Performed at [facility], [place] [address] [place], [place] [address]   No results found.  Review of Systems   Constitutional: Negative for chills and fever.  Genitourinary: Positive for hematuria.  All other systems reviewed and are negative.   There were no vitals taken for this visit. Physical Exam  Constitutional: He appears well-developed.  HENT:  Head: Normocephalic.  Eyes: Pupils are equal, round, and reactive to light.  Cardiovascular: Normal rate.  Respiratory: Effort normal.  GI: Soft.  Genitourinary:    Genitourinary Comments: NO CVAT. Catheter in place with light pink urine.    Musculoskeletal:        General: Normal range of motion.     Cervical back: Normal range of motion.  Neurological: He is alert.  Skin: Skin is warm.     Assessment/Plan  Proceed as planned with cysto, bilateral retrogrades, TURBT. He very well may have muscle-invasive cancer. This would potentially complicate AAA repair plans. Will coordinate with Dr. Doren Stokes overall plan.   Benjamin Frock, MD 09/26/2019, 6:50 AM

## 2019-09-26 NOTE — Anesthesia Postprocedure Evaluation (Signed)
Anesthesia Post Note  Patient: [name]  Procedure(s) Performed: TRANSURETHRAL RESECTION OF BLADDER TUMOR (TURBT) (N/A ) CYSTOSCOPY WITH RETROGRADE PYELOGRAM (Bilateral )     Anesthesia Type: General Comments: Evaluated patient in PACU prior to discharge.   Pt alert and oriented x3. Good mentation and recall. O2 Saturation 88% - 96% on RA. Discussed IS in detail, recommend utilizing 10x per hour for next 24 hours.  Mobility is consistent with preop limitations per wife. We discussed close monitoring at home and ambulation with assistance for the next 24 hours.   Wife and children will be present and available at home for the next 24 hours.                Effie Berkshire

## 2019-09-26 NOTE — Brief Op Note (Signed)
09/26/2019  11:24 AM  PATIENT:  Gwyndolyn Saxon A Porchia  79 y.o. male  PRE-OPERATIVE DIAGNOSIS:  BLADDER CANCER  POST-OPERATIVE DIAGNOSIS:  BLADDER CANCER  PROCEDURE:  Procedure(s) with comments: TRANSURETHRAL RESECTION OF BLADDER TUMOR (TURBT) (N/A) - 1 HR CYSTOSCOPY WITH RETROGRADE PYELOGRAM (Bilateral)  SURGEON:  Surgeon(s) and Role:    * Alexis Frock, MD - Primary  PHYSICIAN ASSISTANT:   ASSISTANTS: none   ANESTHESIA:   general  EBL:  minimal   BLOOD ADMINISTERED:none  DRAINS: none   LOCAL MEDICATIONS USED:  NONE  SPECIMEN:  Source of Specimen:  1 - bladder tumor; 2- base of bladder tumor  DISPOSITION OF SPECIMEN:  PATHOLOGY  COUNTS:  YES  TOURNIQUET:  * No tourniquets in log *  DICTATION: .Other Dictation: Dictation Number [id number]  PLAN OF CARE: Discharge to home after PACU  PATIENT DISPOSITION:  PACU - hemodynamically stable.   Delay start of Pharmacological VTE agent (>24hrs) due to surgical blood loss or risk of bleeding: yes

## 2019-09-26 NOTE — Anesthesia Postprocedure Evaluation (Signed)
Anesthesia Post Note  Patient: [name]  Procedure(s) Performed: TRANSURETHRAL RESECTION OF BLADDER TUMOR (TURBT) (N/A ) CYSTOSCOPY WITH RETROGRADE PYELOGRAM (Bilateral )     Patient location during evaluation: PACU Anesthesia Type: General Level of consciousness: awake and alert Pain management: pain level controlled Vital Signs Assessment: post-procedure vital signs reviewed and stable Respiratory status: spontaneous breathing, nonlabored ventilation, respiratory function stable and patient connected to nasal cannula oxygen Cardiovascular status: blood pressure returned to baseline and stable Postop Assessment: no apparent nausea or vomiting Anesthetic complications: no    Last Vitals:  Vitals:   09/26/19 1359 09/26/19 1400  BP:  (!) 165/92  Pulse: 91 85  Resp: (!) 25 20  Temp:    SpO2: (!) 86% 91%    Last Pain:  Vitals:   09/26/19 1345  TempSrc:   PainSc: 4                  Rozlynn Lippold DAVID

## 2019-09-26 NOTE — Addendum Note (Signed)
Addendum  created 09/26/19 1812 by Effie Berkshire, MD   Clinical Note Signed

## 2019-09-26 NOTE — Anesthesia Procedure Notes (Signed)
Procedure Name: LMA Insertion Date/Time: 09/26/2019 10:44 AM Performed by: Sharlette Dense, CRNA Patient Re-evaluated:Patient Re-evaluated prior to induction Oxygen Delivery Method: Circle system utilized Preoxygenation: Pre-oxygenation with 100% oxygen Induction Type: IV induction Ventilation: Mask ventilation without difficulty LMA: LMA inserted LMA Size: 4.0 Number of attempts: 1 Placement Confirmation: positive ETCO2 and breath sounds checked- equal and bilateral Tube secured with: Tape Dental Injury: Teeth and Oropharynx as per pre-operative assessment

## 2019-09-27 NOTE — Op Note (Signed)
NAME: [name], Benjamin A. MEDICAL RECORD HC:[id number] ACCOUNT [id number] DATE OF BIRTH:20-Nov-1940 FACILITY: [facility] LOCATION: [facility] PHYSICIAN:Sahira Cataldi Tresa Moore, MD  OPERATIVE REPORT  DATE OF PROCEDURE:  09/26/2019  SURGEON:  Alexis Frock, MD  PREOPERATIVE DIAGNOSES:   1.  Bladder cancer.  2.  History of hematuria with clot retention.  POSTOPERATIVE DIAGNOSES: 1.  Bladder cancer.  2.  History of hematuria with clot retention.  PROCEDURES:   1.  Transection of bladder tumor, volume medium. 2.  Bilateral retrograde pyelograms, interpretation.  ESTIMATED BLOOD LOSS:  20 mL.  COMPLICATIONS:  None.  SPECIMENS: 1.  Bladder tumor. 2.  Base of bladder tumor.  FINDINGS: 1.  Unremarkable bilateral retrograde pyelograms. 2.  Papillary tumor at the right lateral bladder wall, total surface area approximately 3 cm2. 3.  Area of erythema and edema in the posterior bladder wall, felt most likely to represent catheter irritation.   4.  Complete resolution of all accessible visible papillary tumor following transurethral resection.  INDICATIONS:  The patient is a pleasant 79 year old man who was found on workup of gross hematuria and clot retention to have a papillary bladder tumor.  He was on blood thinners.  His hematuria was very impressive.  He has now held his blood thinners  with much improvement of his hematuria and he presents for transurethral resection of bladder tumor today.  Notably, also has an incidental large abdominal aortic aneurysm, has met with vascular surgery, who also feels that he will require open repair.  He has  not yet had evaluation his bladder tumor completely.  Informed consent was obtained and placed in the medical record.  DESCRIPTION OF PROCEDURE:  The patient being identified, the procedure being transurethral resection of bladder tumor and retrograde was confirmed.  Procedure timeout was performed.  IV antibiotics administered.  General LMA anesthesia  induced.  The  patient was placed into a low lithotomy position.  A sterile field was created, prepping and draping his penis, perineum and proximal thighs using iodine.  Cystourethroscopy was performed using 21-French rigid cystoscope with offset lens.  Inspection of  the anterior and posterior urethra is unremarkable.  Inspection of bladder revealed some erythema and edema in the posterior wall, likely consistent with catheter irritation.  On the right lateral wall, there was an area of papillary tumor approximately  3 cm2.  This was actually much less volume than anticipated.  This was approximately 1 cm lateral to the right ureteral orifice.  Attention was directed to pyelograms.  First, the left ureteral orifice was cannulated with a 6-French renal catheter and  left retrograde pyelogram was obtained.  Left retrograde pyelogram demonstrated a single left ureter, single system left kidney.  No filling defects or narrowing noted.  Similarly, right retrograde pyelogram was obtained.  Right retrograde pyelogram demonstrates a single right ureter and single system right kidney.  No filling defects or narrowing noted.  Next, the cystoscope was exchanged for a 26-French resectoscope sheath with visual obturator and using resectoscope  loop, very careful resection was performed, first of the right papillary bladder tumor to the area of the superficial fibromuscular stroma of the bladder and several swipes were taken of the posterior erythema.  These fragments were set aside labeled as  bladder tumor.  Next, cold cup biopsy forceps were used to obtain representative deep bites of each foci.  These was set aside labeled as base of bladder tumor.  The resection loop was once again used to coagulate the base of these deep biopsy areas.  Following this, excellent hemostasis showed complete resolution of all areas of papillary bladder tumor.  No evidence of bladder perforation.  Ureteral orifices remained visibly  patent.  Bladder was partially emptied per cystoscope.  Procedure was  terminated.  The patient tolerated the procedure well.  No immediate perioperative   The patient was taken to postanesthesia care unit in stable condition with plan for discharge home after a trial of void.  VN/NUANCE  D:09/26/2019 T:09/26/2019 JOB:[id number]

## 2019-09-29 LAB — SURGICAL PATHOLOGY

## 2019-09-30 ENCOUNTER — Encounter: Payer: Self-pay | Admitting: Physician Assistant

## 2019-09-30 ENCOUNTER — Telehealth: Payer: Self-pay

## 2019-09-30 ENCOUNTER — Ambulatory Visit: Payer: Medicare HMO | Admitting: Physician Assistant

## 2019-09-30 ENCOUNTER — Other Ambulatory Visit: Payer: Self-pay

## 2019-09-30 VITALS — BP 136/84 | HR 77 | Ht 66.0 in | Wt 195.0 lb

## 2019-09-30 DIAGNOSIS — I10 Essential (primary) hypertension: Secondary | ICD-10-CM

## 2019-09-30 DIAGNOSIS — I350 Nonrheumatic aortic (valve) stenosis: Secondary | ICD-10-CM

## 2019-09-30 DIAGNOSIS — E785 Hyperlipidemia, unspecified: Secondary | ICD-10-CM

## 2019-09-30 DIAGNOSIS — I714 Abdominal aortic aneurysm, without rupture, unspecified: Secondary | ICD-10-CM

## 2019-09-30 DIAGNOSIS — I2510 Atherosclerotic heart disease of native coronary artery without angina pectoris: Secondary | ICD-10-CM | POA: Diagnosis not present

## 2019-09-30 DIAGNOSIS — Z01810 Encounter for preprocedural cardiovascular examination: Secondary | ICD-10-CM

## 2019-09-30 NOTE — Progress Notes (Addendum)
Cardiology Office Note    Date:  09/30/2019   ID:  WILLOUGHBY CORDY, DOB 1940-12-07, MRN [id number]  PCP:  Celene Squibb, MD  Cardiologist: [name], MD EPS: None  Chief Complaint  Patient presents with  . Pre-op Exam    History of Present Illness:  [name] is a 79 y.o. male with history of CAD DES x 2 pLAD 2006, patent on cath 2009, normal NST 2010, HTN, HLD, history of polio limiting his activity,chronically elevated WBC seen by hematology-no evidence of lymphoma or leukemia.  Patient saw Dr. Johnsie Cancel 06/18/19 and was having exertional dyspnea and not feeling well in general.Echo 06/25/19 normal LVEF 65-70%, mild to mod AS mean gradient 17 mmHg,peak 33.4 NST 07/02/19 normal. It turned out he was anemic and had transfusions.  Patient had surgery for bladder cancer 09/26/19.   Patient is here for surgical clearance. Now has a 6 cm infrarenal AAA and neck is too short for endovascular repair so he'll have to have open repair. They want Plavix held for 1 week prior to surgery. He denies chest pain, shortness of breath, dizziness, edema, presyncope. DOE improved after transfusions.  Past Medical History:  Diagnosis Date  . Blood transfusion without reported diagnosis   . Coronary artery disease   . GERD (gastroesophageal reflux disease)   . HOH (hard of hearing)   . Hyperlipidemia   . Hypertension   . Iron deficiency anemia 05/14/2018  . Polio    hx of. right arm atrophy    Past Surgical History:  Procedure Laterality Date  . CARDIAC CATHETERIZATION  07-09-2007  . CHOLECYSTECTOMY    . COLONOSCOPY WITH PROPOFOL N/A 05/31/2018   Procedure: COLONOSCOPY WITH PROPOFOL;  Surgeon: Rogene Houston, MD;  [facility]: [facility];  Service: Endoscopy;  Laterality: N/A;  1:45  . CORONARY STENT PLACEMENT     x3 vessels bifurcated single vessel  . CYSTOSCOPY W/ RETROGRADES Bilateral 09/26/2019   Procedure: CYSTOSCOPY WITH RETROGRADE PYELOGRAM;  Surgeon: Alexis Frock, MD;   Location: [facility];  Service: Urology;  Laterality: Bilateral;  . ESOPHAGOGASTRODUODENOSCOPY (EGD) WITH PROPOFOL N/A 05/31/2018   Procedure: ESOPHAGOGASTRODUODENOSCOPY (EGD) WITH PROPOFOL;  Surgeon: Rogene Houston, MD;  [facility];  Service: Endoscopy;  Laterality: N/A;  . ESOPHAGOGASTRODUODENOSCOPY (EGD) WITH PROPOFOL N/A 08/08/2019   Procedure: ESOPHAGOGASTRODUODENOSCOPY (EGD) WITH PROPOFOL;  Surgeon: Rogene Houston, MD;  [facility];  Service: Endoscopy;  Laterality: N/A;  . KNEE SURGERY     Left knee  steel rod  . POLYPECTOMY  05/31/2018   Procedure: POLYPECTOMY;  Surgeon: Rogene Houston, MD;  [facility]: [facility];  Service: Endoscopy;;  sigmoid  . TRANSURETHRAL RESECTION OF BLADDER TUMOR N/A 09/26/2019   Procedure: TRANSURETHRAL RESECTION OF BLADDER TUMOR (TURBT);  Surgeon: Alexis Frock, MD;  Location: [facility];  Service: Urology;  Laterality: N/A;  1 HR    Current Medications: Current Meds  Medication Sig  . aspirin EC 81 MG tablet Take 81 mg by mouth daily.  . Cholecalciferol (VITAMIN D3 PO) Take 1 capsule by mouth daily.   . clopidogrel (PLAVIX) 75 MG tablet Take 75 mg by mouth daily.  Marland Kitchen ELDERBERRY PO Take by mouth daily.  . ferrous sulfate 325 (65 FE) MG tablet Take 1 tablet (325 mg total) by mouth 2 (two) times daily with a meal.  . nitroGLYCERIN (NITROSTAT) 0.4 MG SL tablet Place 1 tablet (0.4 mg total) under the tongue every 5 (five) minutes as needed for  chest pain.  . pantoprazole (PROTONIX) 40 MG tablet Take 1 tablet (40 mg total) by mouth daily. Take 30 min before meals  . simvastatin (ZOCOR) 40 MG tablet Take 40 mg by mouth at bedtime.   . tamsulosin (FLOMAX) 0.4 MG CAPS capsule Take 1 capsule (0.4 mg total) by mouth daily as needed. For slow urinary stream     Allergies:   Patient has no known allergies.   Social History   Socioeconomic History  . Marital status: Married    Spouse name: Not on file  . Number of children: 2  . Years  of education: Not on file  . Highest education level: Not on file  Occupational History  . Occupation: retired Furniture conservator/restorer  Tobacco Use  . Smoking status: Former Smoker    Packs/day: 1.00    Years: 15.00    Pack years: 15.00    Types: Cigarettes    Quit date: 06/12/2004    Years since quitting: 15.3  . Smokeless tobacco: Never Used  Substance and Sexual Activity  . Alcohol use: No    Alcohol/week: 0.0 standard drinks  . Drug use: No  . Sexual activity: Not Currently    Birth control/protection: None  Other Topics Concern  . Not on file  Social History Narrative  . Not on file   Social Determinants of Health   Financial Resource Strain:   . Difficulty of Paying Living Expenses:   Food Insecurity:   . Worried About Charity fundraiser in the Last Year:   . Arboriculturist in the Last Year:   Transportation Needs:   . Film/video editor (Medical):   Marland Kitchen Lack of Transportation (Non-Medical):   Physical Activity:   . Days of Exercise per Week:   . Minutes of Exercise per Session:   Stress:   . Feeling of Stress :   Social Connections:   . Frequency of Communication with Friends and Family:   . Frequency of Social Gatherings with Friends and Family:   . Attends Religious Services:   . Active Member of Clubs or Organizations:   . Attends Archivist Meetings:   Marland Kitchen Marital Status:      Family History:  The patient's family history includes Heart attack in his father; Heart attack (age of onset: 74) in his brother; Hypertension in his mother.   ROS:   Please see the history of present illness.    ROS All other systems reviewed and are negative.   PHYSICAL EXAM:   VS:  BP 136/84   Pulse 77   Ht 5\' 6"  (1.676 m)   Wt 195 lb (88.5 kg)   SpO2 95%   BMI 31.47 kg/m   Physical Exam  GEN: Well nourished, well developed, in no acute distress  Neck: no JVD, carotid bruits, or masses [id number] systolic murmur LSB, no rubs, or gallops  Respiratory:  clear to  auscultation bilaterally, normal work of breathing GI: soft, nontender, nondistended, + BS Ext: without cyanosis, clubbing, or edema, Good distal pulses bilaterally Neuro:  Alert and Oriented x 3 Psych: euthymic mood, full affect  Wt Readings from Last 3 Encounters:  09/30/19 195 lb (88.5 kg)  09/26/19 195 lb 8.8 oz (88.7 kg)  09/24/19 195 lb 9 oz (88.7 kg)      Studies/Labs Reviewed:   EKG:  EKG is notordered today    Recent Labs: 09/06/2019: ALT 26 09/24/2019: BUN 12; Creatinine, Ser 0.60; Hemoglobin 12.9; Platelets 499; Potassium 3.9; Sodium  143   Lipid Panel    Component Value Date/Time   CHOL 125 02/23/2009 0000   TRIG 60 02/23/2009 0000   HDL 47 02/23/2009 0000   CHOLHDL 4.5 06/10/2007 0430   VLDL 19 06/10/2007 0430   LDLCALC 66 02/23/2009 0000    Additional studies/ records that were reviewed today include:  Echo 06/25/19 IMPRESSIONS     1. Left ventricular ejection fraction, by visual estimation, is 65 to  70%. The left ventricle has hyperdynamic function. There is no left  ventricular hypertrophy.   2. Left ventricular diastolic parameters are indeterminate.   3. The left ventricle has no regional wall motion abnormalities.   4. Global right ventricle has normal systolic function.The right  ventricular size is normal. No increase in right ventricular wall  thickness.   5. Left atrial size was mildly dilated.   6. Right atrial size was mildly dilated.   7. Mild to moderate mitral annular calcification.   8. The mitral valve is normal in structure. No evidence of mitral valve  regurgitation. No evidence of mitral stenosis.   9. The tricuspid valve is not well visualized.  10. The aortic valve is tricuspid. Aortic valve regurgitation is not  visualized. Mild to moderate aortic valve stenosis. Aortic valve mean  gradient measures 17.0 mmHg. Aortic valve peak gradient measures 33.4  mmHg. Aortic valve area, by VTI measures 1.38   cm.  11. Pulmonic  regurgitation is mild.  12. The pulmonic valve was not well visualized. Pulmonic valve  regurgitation is mild.  13. The interatrial septum was not well visualized.   FINDINGS   Left Ventricle: Left ventricular ejection fraction, by visual estimation,  is 65 to 70%. The left ventricle has hyperdynamic function. The left  ventricle has no regional wall motion abnormalities. There is no left  ventricular hypertrophy. Left  ventricular diastolic parameters are indeterminate.   Right Ventricle: The right ventricular size is normal. No increase in  right ventricular wall thickness. Global RV systolic function is has  normal systolic function.   Left Atrium: Left atrial size was mildly dilated.   Right Atrium: Right atrial size was mildly dilated   Pericardium: There is no evidence of pericardial effusion.   Mitral Valve: The mitral valve is normal in structure. There is mild  calcification of the mitral valve leaflet(s). Mild to moderate mitral  annular calcification. No evidence of mitral valve regurgitation. No  evidence of mitral valve stenosis by  observation.   Tricuspid Valve: The tricuspid valve is not well visualized. Tricuspid  valve regurgitation is trivial.   Aortic Valve: The aortic valve is tricuspid. . There is moderate  thickening and moderate calcification of the aortic valve. Aortic valve  regurgitation is not visualized. Mild to moderate aortic stenosis is  present. Moderate aortic valve annular  calcification. There is moderate thickening of the aortic valve. There is  moderate calcification of the aortic valve. Aortic valve mean gradient  measures 17.0 mmHg. Aortic valve peak gradient measures 33.4 mmHg. Aortic  valve area, by VTI measures 1.38  cm.   Pulmonic Valve: The pulmonic valve was not well visualized. Pulmonic valve  regurgitation is mild. Pulmonic regurgitation is mild. No evidence of  pulmonic stenosis.   Aorta: The aortic root is normal in size  and structure.   Pulmonary Artery: Indeterminant PASP, inadequate TR jet.   Venous: The inferior vena cava was not well visualized.   IAS/Shunts: The interatrial septum was not well visualized.    NST  07/02/19  There was no ST segment deviation noted during stress.  The study is normal. No myocardial ischemia or scar.  This is a low risk study.  Nuclear stress EF: 67%.    Cath 2006  SELECTIVE CORONARY ANGIOGRAPHY:  1.  The left main coronary artery is a moderate caliber vessel which is      without significant disease.  2.  The left circumflex coronary artery is a large caliber dominant vessel      with a moderate caliber posterior descending and a posterior lateral      branch. There were several small obtuse marginal branches that have      luminal irregularities.  3.  The left anterior descending coronary artery is a moderate caliber      vessel which has an 80% stenosis in its midportion between the first and      second diagonals involving the ostium of the second diagonal.  The      remainder of the artery has luminal irregularities.  4.  The right coronary artery is a moderate caliber nondominant vessel with      a relatively moderate caliber RV branch.  5.  The left ventriculogram reveals normal LV systolic function, estimated      at 60% with no wall motion abnormalities and no mitral regurgitation.    ASSESSMENT:  1.  Single-vessel coronary disease.  2.  Normal LV systolic function.    PLAN:  My plan is to start this man on Plavix, give him a Plavix load of 300  mg now; put him on 75 mg over the weekend. I will get him revascularized  percutaneously later on in the week. I will show these films to my colleague  Dr. Sabino Snipes.    IMPRESSION/RECOMMENDATIONS:  Successful placement of two drug-eluting stents  in the proximal left anterior descending.  Plavix should be continued for a  minimum of a year.  Aspirin will be continued indefinitely.             WED/MEDQ  D:  12/12/2004  T:  12/12/2004  Job:  [id number]    cc:   Angus [address], [place]  [place] [address]  Fax: [phone]    Scarlett Presto, M.D.  Fax: [phone]     ASSESSMENT:    1. Preoperative cardiovascular examination   2. Coronary artery disease involving native coronary artery of native heart without angina pectoris   3. Aortic valve stenosis, etiology of cardiac valve disease unspecified   4. Essential hypertension   5. Hyperlipidemia, unspecified hyperlipidemia type   6. Abdominal aortic aneurysm (AAA) without rupture (HCC)      PLAN:  In order of problems listed above:  Preoperative clearance for open repair of 6 cm AAA by Dr. Scot Dock who wants Plavix held for 6 days. Patient with remote history of stents to LAD 2006, patent on f/u cath 2010, normal nuclear stress test 07/02/19, normal LVEF, mild-mod AS. High risk surgery but excellent mets 7.25. with recent echo and NST in January no further cardiac work up needed. Dr. Johnsie Cancel to weigh in on holding Plavix-Addendum: Dr. Johnsie Cancel says it's ok to hold his Plavix for 5 days and has already sent Dr. Scot Dock a note regarding this. According to the Revised Cardiac Risk Index (RCRI), his Perioperative Risk of Major Cardiac Event is (%): 6.6  His Functional Capacity in METs is: 7.25 according to the [facility] Activity Status Index (DASI).    CAD  S/P DES pLAD x 2 2006, patent on cath 2009, normal NST 07/02/19-no angina  Mild-mod AS on echo 07/01/19-minimal murmur on exam, asymptomatic  HTN-diet controlled not on meds  HLD-managed by PCP    Medication Adjustments/Labs and Tests Ordered: Current medicines are reviewed at length with the patient today.  Concerns regarding medicines are outlined above.  Medication changes, Labs and Tests ordered today are listed in the Patient Instructions below. Patient Instructions  Medication Instructions:  Your physician recommends that you continue on your current  medications as directed. Please refer to the Current Medication list given to you today.  *If you need a refill on your cardiac medications before your next appointment, please call your pharmacy*   Lab Work: None ordered  If you have labs (blood work) drawn today and your tests are completely normal, you will receive your results only by: Marland Kitchen MyChart Message (if you have MyChart) OR . A paper copy in the mail If you have any lab test that is abnormal or we need to change your treatment, we will call you to review the results.   Testing/Procedures: None ordered   Follow-Up: At [facility], you and your health needs are our priority.  As part of our continuing mission to provide you with exceptional heart care, we have created designated Provider Care Teams.  These Care Teams include your primary Cardiologist (physician) and Advanced Practice Providers (APPs -  Physician Assistants and Nurse Practitioners) who all work together to provide you with the care you need, when you need it.  We recommend signing up for the patient portal called "MyChart".  Sign up information is provided on this After Visit Summary.  MyChart is used to connect with patients for Virtual Visits (Telemedicine).  Patients are able to view lab/test results, encounter notes, upcoming appointments, etc.  Non-urgent messages can be sent to your provider as well.   To learn more about what you can do with MyChart, go to [email or web address].    Your next appointment:   6 month(s)  The format for your next appointment:   In Person  Provider:   You may see [name], MD or one of the following Advanced Practice Providers on your designated Care Team:    Bernerd Pho, PA-C   Ermalinda Barrios, PA-C     Other Instructions      Signed, Ermalinda Barrios, Hershal Coria  09/30/2019 9:37 AM    [address], [place], [place]  [address] Phone: [phone]; Fax: [phone]

## 2019-09-30 NOTE — Patient Instructions (Signed)
Medication Instructions:  Your physician recommends that you continue on your current medications as directed. Please refer to the Current Medication list given to you today.  *If you need a refill on your cardiac medications before your next appointment, please call your pharmacy*   Lab Work: None ordered  If you have labs (blood work) drawn today and your tests are completely normal, you will receive your results only by: . MyChart Message (if you have MyChart) OR . A paper copy in the mail If you have any lab test that is abnormal or we need to change your treatment, we will call you to review the results.   Testing/Procedures: None ordered    Follow-Up: At [facility], you and your health needs are our priority.  As part of our continuing mission to provide you with exceptional heart care, we have created designated Provider Care Teams.  These Care Teams include your primary Cardiologist (physician) and Advanced Practice Providers (APPs -  Physician Assistants and Nurse Practitioners) who all work together to provide you with the care you need, when you need it.  We recommend signing up for the patient portal called "MyChart".  Sign up information is provided on this After Visit Summary.  MyChart is used to connect with patients for Virtual Visits (Telemedicine).  Patients are able to view lab/test results, encounter notes, upcoming appointments, etc.  Non-urgent messages can be sent to your provider as well.   To learn more about what you can do with MyChart, go to [email or web address].    Your next appointment:   6 month(s)  The format for your next appointment:   In Person  Provider:   You may see Peter Nishan, MD or one of the following Advanced Practice Providers on your designated Care Team:    Konica Stankowski Strader, PA-C   Michele Lenze, PA-C     Other Instructions   

## 2019-09-30 NOTE — Telephone Encounter (Signed)
Ria Comment from [facility] left vm-Dr. Tresa Moore feels it is okay from his point of view to proceed with pt's AAA repair on 4/27.

## 2019-10-01 NOTE — Progress Notes (Addendum)
Your procedure is scheduled on Tuesday April 27.  Report to [facility] Main Entrance "A" at 05:30 A.M., and check in at the Admitting office.  Call this number if you have problems the morning of surgery: [phone]  Call [phone] if you have any questions prior to your surgery date Monday-Friday 8am-4pm   Remember: Do not eat or drink after midnight the night before your surgery    Take these medicines the morning of surgery with A SIP OF WATER: pantoprazole (PROTONIX)  nitroGLYCERIN (NITROSTAT) - if needed  Follow your surgeon's instructions on when to stop Aspirin and clopidogrel (PLAVIX).   If no instructions were given by your surgeon then you will need to call the office to get those instructions.     As of today, STOP taking any Aspirin containing products, Aleve, Naproxen, Ibuprofen, Motrin, Advil, Goody's, BC's, all herbal medications, fish oil, and all vitamins.    The Morning of Surgery  Do not wear jewelry  Do not wear lotions, powders, colognes, or deodorant  Men may shave face and neck.  Do not bring valuables to the hospital.  [facility] is not responsible for any belongings or valuables.  If you are a smoker, DO NOT Smoke 24 hours prior to surgery  If you wear a CPAP at night please bring your mask the morning of surgery   Remember that you must have someone to transport you home after your surgery, and remain with you for 24 hours if you are discharged the same day.   Please bring cases for contacts, glasses, hearing aids, dentures or bridgework because it cannot be worn into surgery.    Leave your suitcase in the car.  After surgery it may be brought to your room.  For patients admitted to the hospital, discharge time will be determined by your treatment team.  Patients discharged the day of surgery will not be allowed to drive home.    Special instructions:   [facility]- Preparing For Surgery  Before surgery, you can play an important  role. Because skin is not sterile, your skin needs to be as free of germs as possible. You can reduce the number of germs on your skin by washing with CHG (chlorahexidine gluconate) Soap before surgery.  CHG is an antiseptic cleaner which kills germs and bonds with the skin to continue killing germs even after washing.    Oral Hygiene is also important to reduce your risk of infection.  Remember - BRUSH YOUR TEETH THE MORNING OF SURGERY WITH YOUR REGULAR TOOTHPASTE  Please do not use if you have an allergy to CHG or antibacterial soaps. If your skin becomes reddened/irritated stop using the CHG.  Do not shave (including legs and underarms) for at least 48 hours prior to first CHG shower. It is OK to shave your face.  Please follow these instructions carefully.   1. Shower the NIGHT BEFORE SURGERY and the MORNING OF SURGERY with CHG Soap.   2. If you chose to wash your hair and body, wash as usual with your normal shampoo and body-wash/soap.  3. Rinse your hair and body thoroughly to remove the shampoo and soap.  4. Apply CHG directly to the skin (ONLY FROM THE NECK DOWN) and wash gently with a scrungie or a clean washcloth.   5. Do not use on open wounds or open sores. Avoid contact with your eyes, ears, mouth and genitals (private parts). Wash Face and genitals (private parts)  with your normal soap.  6. Wash thoroughly, paying special attention to the area where your surgery will be performed.  7. Thoroughly rinse your body with warm water from the neck down.  8. DO NOT shower/wash with your normal soap after using and rinsing off the CHG Soap.  9. Pat yourself dry with a CLEAN TOWEL.  10. Wear CLEAN PAJAMAS to bed the night before surgery  11. Place CLEAN SHEETS on your bed the night of your first shower and DO NOT SLEEP WITH PETS.  12. Wear comfortable clothes the morning of surgery.     Day of Surgery:  Please shower the morning of surgery with the CHG soap Do not apply  any deodorants/lotions. Please wear clean clothes to the hospital/surgery center.   Remember to brush your teeth WITH YOUR REGULAR TOOTHPASTE.   Please read over the following fact sheets that you were given.

## 2019-10-02 ENCOUNTER — Inpatient Hospital Stay (HOSPITAL_COMMUNITY)
Admission: RE | Admit: 2019-10-02 | Discharge: 2019-10-02 | Disposition: A | Discharge status: Home / Self Care | Payer: Medicare HMO | Source: Ambulatory Visit

## 2019-10-02 NOTE — Progress Notes (Signed)
Your procedure is scheduled on Tuesday, Oct 14, 2019.  Report to [facility] Main Entrance "A" at 05:30 A.M., and check in at the Admitting office.  Call this number if you have problems the morning of surgery: [phone]  Call [phone] if you have any questions prior to your surgery date Monday-Friday 8am-4pm  Remember: Do not eat or drink after midnight the night before your surgery    Take these medicines the morning of surgery with A SIP OF WATER: pantoprazole (PROTONIX)  nitroGLYCERIN (NITROSTAT) - if needed tamsulosin [facility]) - if needed  Follow your surgeon's instructions on when to stop Aspirin and clopidogrel (PLAVIX).   If no instructions were given by your surgeon then you will need to call the office to get those instructions.     7 Days prior to surgery, STOP taking any NSAID products such as Aleve, Naproxen, Ibuprofen, Motrin, Advil, Goody's, BC's, all herbal medications, fish oil, and all vitamins.    The Morning of Surgery  Do not wear jewelry  Do not wear lotions, powders, colognes, or deodorant  Men may shave face and neck.  Do not bring valuables to the hospital.  [facility] is not responsible for any belongings or valuables.  If you are a smoker, DO NOT Smoke 24 hours prior to surgery  If you wear a CPAP at night please bring your mask the morning of surgery   Remember that you must have someone to transport you home after your surgery, and remain with you for 24 hours if you are discharged the same day.   Please bring cases for contacts, glasses, hearing aids, dentures or bridgework because it cannot be worn into surgery.    Leave your suitcase in the car.  After surgery it may be brought to your room.  For patients admitted to the hospital, discharge time will be determined by your treatment team.  Patients discharged the day of surgery will not be allowed to drive home.    Special instructions:   [place]- Preparing For  Surgery  Before surgery, you can play an important role. Because skin is not sterile, your skin needs to be as free of germs as possible. You can reduce the number of germs on your skin by washing with CHG (chlorahexidine gluconate) Soap before surgery.  CHG is an antiseptic cleaner which kills germs and bonds with the skin to continue killing germs even after washing.    Oral Hygiene is also important to reduce your risk of infection.  Remember - BRUSH YOUR TEETH THE MORNING OF SURGERY WITH YOUR REGULAR TOOTHPASTE  Please do not use if you have an allergy to CHG or antibacterial soaps. If your skin becomes reddened/irritated stop using the CHG.  Do not shave (including legs and underarms) for at least 48 hours prior to first CHG shower. It is OK to shave your face.  Please follow these instructions carefully.   1. Shower the NIGHT BEFORE SURGERY and the MORNING OF SURGERY with CHG Soap.   2. If you chose to wash your hair and body, wash as usual with your normal shampoo and body-wash/soap.  3. Rinse your hair and body thoroughly to remove the shampoo and soap.  4. Apply CHG directly to the skin (ONLY FROM THE NECK DOWN) and wash gently with a scrungie or a clean washcloth.   5. Do not use on open wounds or open sores. Avoid contact with your eyes, ears, mouth and genitals (private parts). Wash Face and  genitals (private parts)  with your normal soap.   6. Wash thoroughly, paying special attention to the area where your surgery will be performed.  7. Thoroughly rinse your body with warm water from the neck down.  8. DO NOT shower/wash with your normal soap after using and rinsing off the CHG Soap.  9. Pat yourself dry with a CLEAN TOWEL.  10. Wear CLEAN PAJAMAS to bed the night before surgery  11. Place CLEAN SHEETS on your bed the night of your first shower and DO NOT SLEEP WITH PETS.  12. Wear comfortable clothes the morning of surgery.     Day of Surgery:  Please shower the  morning of surgery with the CHG soap Do not apply any deodorants/lotions. Please wear clean clothes to the hospital/surgery center.   Remember to brush your teeth WITH YOUR REGULAR TOOTHPASTE.   Please read over the following fact sheets that you were given.

## 2019-10-06 ENCOUNTER — Other Ambulatory Visit (HOSPITAL_COMMUNITY): Payer: Medicare HMO

## 2019-10-06 DIAGNOSIS — C678 Malignant neoplasm of overlapping sites of bladder: Secondary | ICD-10-CM | POA: Diagnosis not present

## 2019-10-09 ENCOUNTER — Other Ambulatory Visit: Payer: Self-pay

## 2019-10-09 ENCOUNTER — Encounter (HOSPITAL_COMMUNITY): Payer: Self-pay

## 2019-10-09 ENCOUNTER — Telehealth: Payer: Self-pay

## 2019-10-09 ENCOUNTER — Encounter (HOSPITAL_COMMUNITY)
Admission: RE | Admit: 2019-10-09 | Discharge: 2019-10-09 | Disposition: A | Discharge status: Home / Self Care | Payer: Medicare HMO | Source: Ambulatory Visit | Attending: Vascular Surgery | Admitting: Vascular Surgery

## 2019-10-09 DIAGNOSIS — I714 Abdominal aortic aneurysm, without rupture: Secondary | ICD-10-CM | POA: Diagnosis not present

## 2019-10-09 DIAGNOSIS — I2510 Atherosclerotic heart disease of native coronary artery without angina pectoris: Secondary | ICD-10-CM | POA: Insufficient documentation

## 2019-10-09 DIAGNOSIS — Z7982 Long term (current) use of aspirin: Secondary | ICD-10-CM | POA: Diagnosis not present

## 2019-10-09 DIAGNOSIS — Z6831 Body mass index (BMI) 31.0-31.9, adult: Secondary | ICD-10-CM | POA: Insufficient documentation

## 2019-10-09 DIAGNOSIS — Z01812 Encounter for preprocedural laboratory examination: Secondary | ICD-10-CM | POA: Insufficient documentation

## 2019-10-09 DIAGNOSIS — I10 Essential (primary) hypertension: Secondary | ICD-10-CM | POA: Insufficient documentation

## 2019-10-09 DIAGNOSIS — K219 Gastro-esophageal reflux disease without esophagitis: Secondary | ICD-10-CM | POA: Diagnosis not present

## 2019-10-09 DIAGNOSIS — E669 Obesity, unspecified: Secondary | ICD-10-CM | POA: Insufficient documentation

## 2019-10-09 DIAGNOSIS — Z87891 Personal history of nicotine dependence: Secondary | ICD-10-CM | POA: Insufficient documentation

## 2019-10-09 DIAGNOSIS — Z79899 Other long term (current) drug therapy: Secondary | ICD-10-CM | POA: Diagnosis not present

## 2019-10-09 DIAGNOSIS — N390 Urinary tract infection, site not specified: Secondary | ICD-10-CM

## 2019-10-09 DIAGNOSIS — E785 Hyperlipidemia, unspecified: Secondary | ICD-10-CM | POA: Diagnosis not present

## 2019-10-09 DIAGNOSIS — D500 Iron deficiency anemia secondary to blood loss (chronic): Secondary | ICD-10-CM | POA: Insufficient documentation

## 2019-10-09 HISTORY — DX: Malignant neoplasm of bladder, unspecified: C67.9

## 2019-10-09 HISTORY — DX: Abdominal aortic aneurysm, without rupture: I71.4

## 2019-10-09 HISTORY — DX: Abdominal aortic aneurysm, without rupture, unspecified: I71.40

## 2019-10-09 LAB — COMPREHENSIVE METABOLIC PANEL
ALT: 25 U/L (ref 0–44)
AST: 27 U/L (ref 15–41)
Albumin: 3.7 g/dL (ref 3.5–5.0)
Alkaline Phosphatase: 65 U/L (ref 38–126)
Anion gap: 11 (ref 5–15)
BUN: 11 mg/dL (ref 8–23)
CO2: 30 mmol/L (ref 22–32)
Calcium: 9.3 mg/dL (ref 8.9–10.3)
Chloride: 99 mmol/L (ref 98–111)
Creatinine, Ser: 0.66 mg/dL (ref 0.61–1.24)
GFR calc Af Amer: >60 mL/min (ref >60)
GFR calc non Af Amer: >60 mL/min (ref >60)
Glucose, Bld: 160 mg/dL — ABNORMAL HIGH (ref 70–99)
Potassium: 3.6 mmol/L (ref 3.5–5.1)
Sodium: 140 mmol/L (ref 135–145)
Total Bilirubin: 0.4 mg/dL (ref 0.3–1.2)
Total Protein: 7.4 g/dL (ref 6.5–8.1)

## 2019-10-09 LAB — ABO/RH: ABO/RH(D): O POS

## 2019-10-09 LAB — URINALYSIS, ROUTINE W REFLEX MICROSCOPIC
Bilirubin Urine: NEGATIVE
Glucose, UA: NEGATIVE mg/dL
Ketones, ur: NEGATIVE mg/dL
Nitrite: POSITIVE — AB
Protein, ur: 30 mg/dL — AB
RBC / HPF: >50 RBC/hpf — ABNORMAL HIGH (ref 0–5)
Specific Gravity, Urine: 1.015 (ref 1.005–1.030)
WBC, UA: >50 WBC/hpf — ABNORMAL HIGH (ref 0–5)
pH: 6 (ref 5.0–8.0)

## 2019-10-09 LAB — CBC
HCT: 45.2 % (ref 39.0–52.0)
Hemoglobin: 13.4 g/dL (ref 13.0–17.0)
MCH: 27.3 pg (ref 26.0–34.0)
MCHC: 29.6 g/dL — ABNORMAL LOW (ref 30.0–36.0)
MCV: 92.1 fL (ref 80.0–100.0)
Platelets: 328 10*3/uL (ref 150–400)
RBC: 4.91 MIL/uL (ref 4.22–5.81)
RDW: 18.4 % — ABNORMAL HIGH (ref 11.5–15.5)
WBC: 13.8 10*3/uL — ABNORMAL HIGH (ref 4.0–10.5)
nRBC: 0 % (ref 0.0–0.2)

## 2019-10-09 LAB — APTT: aPTT: 29 seconds (ref 24–36)

## 2019-10-09 LAB — SURGICAL PCR SCREEN
MRSA, PCR: NEGATIVE
Staphylococcus aureus: NEGATIVE

## 2019-10-09 LAB — PROTIME-INR
INR: 1 (ref 0.8–1.2)
Prothrombin Time: 12.7 seconds (ref 11.4–15.2)

## 2019-10-09 MED ORDER — CIPROFLOXACIN HCL 500 MG PO TABS: 500.0000 mg | ORAL_TABLET | Freq: Two times a day (BID) | ORAL | 0 refills | Status: DC

## 2019-10-09 NOTE — Progress Notes (Signed)
PCP - Delphina Cahill Cardiologist - [name]  Chest x-ray - denies EKG - 09/24/19 Stress Test - denies ECHO - 06/25/19 Cardiac Cath - 06/2007 - stents placed  Blood Thinner Instructions: LD plavix 10/06/19 Aspirin Instructions: LD 10/06/19   COVID TEST- 4/30 @ [facility]   Anesthesia review: yes, see recent note from [place] PA  Patient denies shortness of breath, fever, cough and chest pain at PAT appointment   All instructions explained to the patient, with a verbal understanding of the material. Patient agrees to go over the instructions while at home for a better understanding. Patient also instructed to self quarantine after being tested for COVID-19. The opportunity to ask questions was provided.

## 2019-10-09 NOTE — Telephone Encounter (Signed)
Jesica from preadmit testing called and said that his urine looked dirty and she needed to let us know that it appeared he has a UTI.   Abx sent to pharmacy for protocol.   York Cerise, CMA

## 2019-10-09 NOTE — Progress Notes (Signed)
Left message for Larene Beach at Dr Scot Dock office about patients urine sample

## 2019-10-10 ENCOUNTER — Encounter (HOSPITAL_COMMUNITY): Payer: Self-pay

## 2019-10-10 ENCOUNTER — Other Ambulatory Visit (HOSPITAL_COMMUNITY)
Admission: RE | Admit: 2019-10-10 | Discharge: 2019-10-10 | Disposition: A | Discharge status: Home / Self Care | Payer: Medicare HMO | Source: Ambulatory Visit | Attending: Vascular Surgery | Admitting: Vascular Surgery

## 2019-10-10 DIAGNOSIS — Z01812 Encounter for preprocedural laboratory examination: Secondary | ICD-10-CM | POA: Insufficient documentation

## 2019-10-10 DIAGNOSIS — Z20822 Contact with and (suspected) exposure to covid-19: Secondary | ICD-10-CM | POA: Insufficient documentation

## 2019-10-10 NOTE — Anesthesia Preprocedure Evaluation (Addendum)
Anesthesia Evaluation  Patient identified by MRN, date of birth, ID band Patient awake    Reviewed: Allergy & Precautions, NPO status , Patient's Chart, lab work & pertinent test results  Airway Mallampati: I  TM Distance: >3 FB Neck ROM: Full    Dental   Pulmonary former smoker,    Pulmonary exam normal        Cardiovascular hypertension, Pt. on medications + CAD  Normal cardiovascular exam     Neuro/Psych    GI/Hepatic GERD  Medicated and Controlled,  Endo/Other    Renal/GU      Musculoskeletal   Abdominal   Peds  Hematology   Anesthesia Other Findings   Reproductive/Obstetrics                             Anesthesia Physical Anesthesia Plan  ASA: III  Anesthesia Plan: General   Post-op Pain Management:    Induction: Intravenous  PONV Risk Score and Plan:   Airway Management Planned: Oral ETT  Additional Equipment: Arterial line, CVP and Ultrasound Guidance Line Placement  Intra-op Plan:   Post-operative Plan: Possible Post-op intubation/ventilation  Informed Consent: I have reviewed the patients History and Physical, chart, labs and discussed the procedure including the risks, benefits and alternatives for the proposed anesthesia with the patient or authorized representative who has indicated his/her understanding and acceptance.       Plan Discussed with: CRNA and Surgeon  Anesthesia Plan Comments: (PAT note written 10/10/2019 by Myra Gianotti, PA-C. )       Anesthesia Quick Evaluation

## 2019-10-10 NOTE — Progress Notes (Signed)
Anesthesia Chart Review:  Case: [id number] Date/Time: 10/14/19 0715   Procedure: OPEN ANEURYSM ABDOMINAL AORTIC REPAIR (N/A )   Anesthesia type: General   Pre-op diagnosis: ABDOMINAL AORTIC ANEURYSM   Location: [facility] OR ROOM 12 / [facility]   Surgeons: Angelia Mould, MD      DISCUSSION: Patient is a 79 year old former smoker (15 pack years, quit 06/12/04) with h/o HLD, HTN, GERD, CAD (DES proximal LAD x2 12/12/04), mild-moderate AS (mean gradient 17.0 mmHg, peak gradient 33.4 mmHg, AVA by VTI 1.38 cm), Polio with right arm paralysis, bladder cancer (s/p TURBT 09/26/19: noninvasive low grade papillary urothelial carcinoma), AAA (6.1 cm 09/2019), anemia (gastric AVM, s/p APC 05/2018, nonbleeding on 07/2019, s/p PRBC).  BMI is consistent with obesity.   Patient had preoperative cardiology evaluation by Ermalinda Barrios, PA-C on 09/30/19. She wrote: Preoperative clearance for open repair of 6 cm AAA by Dr. Scot Dock who wants Plavix held for 6 days. Patient with remote history of stents to LAD 2006, patent on f/u cath 2010, normal nuclear stress test 07/02/19, normal LVEF, mild-mod AS. High risk surgery but excellent mets 7.25. with recent echo and NST in January no further cardiac work up needed. Dr. Johnsie Cancel to weigh in on holding Plavix-Addendum: Dr. Johnsie Cancel says it's ok to hold his Plavix for 5 days and has already sent Dr. Scot Dock a note regarding this. According to the Revised Cardiac Risk Index (RCRI), his Perioperative Risk of Major Cardiac Event is (%): 6.6 His Functional Capacity in METs is: 7.25 according to the [employer] Activity Status Index (DASI).   10/10/19 presurgical COVID-19 test in process. Anesthesia team to evaluate on the day of surgery.    VS: BP 135/77   Pulse 82   Temp 36.9 C (Oral)   Resp 18   Ht '5\' 6"'$  (1.676 m)   Wt 88 kg   SpO2 95%   BMI 31.33 kg/m    PROVIDERS: Celene Squibb, MD is PCP [name], MD is cardiologist Deitra Mayo, MD is vascular surgeon Alexis Frock, MD is urologist Hildred Laser, MD is GI Derek Jack, MD is hematologist. Seen in 2019 for leukocytosis. By notes, "His BCR/ABL by FISH was negative.  Jak 2 V617F test was negative.  Flow cytometry shows a predominance of T lymphocytes with relative abundance of CD 8+ cells.  No monoclonal B-cell population was identified.  This could be a reactive process.  LDH was within normal limits.Marland KitchenMarland KitchenHe has asymptomatic/incidental eosinophilia with absolute eosinophil count less than 1500.  He does not have any clinical signs or symptoms of disorders that can cause eosinophilia.  Hence we can safely delay further work-up with a bone marrow biopsy."   LABS: Labs reviewed: Acceptable for surgery. Cipro started by [facility] for abnormal UA (+ leuk,nitrites, blood, many bacteria). (all labs ordered are listed, but only abnormal results are displayed)  Labs Reviewed  CBC - Abnormal; Notable for the following components:      Result Value   WBC 13.8 (*)    MCHC 29.6 (*)    RDW 18.4 (*)    All other components within normal limits  COMPREHENSIVE METABOLIC PANEL - Abnormal; Notable for the following components:   Glucose, Bld 160 (*)    All other components within normal limits  URINALYSIS, ROUTINE W REFLEX MICROSCOPIC - Abnormal; Notable for the following components:   APPearance CLOUDY (*)    Hgb urine dipstick LARGE (*)    Protein, ur 30 (*)    Nitrite POSITIVE (*)  Leukocytes,Ua LARGE (*)    RBC / HPF >50 (*)    WBC, UA >50 (*)    Bacteria, UA MANY (*)    All other components within normal limits  SURGICAL PCR SCREEN  APTT  PROTIME-INR  TYPE AND SCREEN  ABO/RH    EGD 08/08/2019 - Normal esophagus. - Z-line regular, 39 cm from the incisors. - 2 cm hiatal hernia. - Normal stomach. - Normal duodenal bulb. - A single non-bleeding angiodysplastic lesion in the duodenum. - No specimens collected.   IMAGES: CT Angio Abdomen Pelvis 09/23/2019 IMPRESSION: VASCULAR 1. Stable  uncomplicated large infrarenal abdominal aortic aneurysm measuring approximately 6 cm in diameter. No significant mural thrombus. Aortic aneurysm NOS (ICD10-I71.9). 2. Duplicated right renal arteries with approximately 50% luminal narrowing involving the dominant right renal artery and approximately 50% luminal narrowing of the solitary left renal artery without associated asymmetric renal enhancement or atrophy. 3. Suspected approximately 50% luminal narrowing of the right common femoral artery. 4. Incidentally noted common origin of the celiac and SMA without a hemodynamically significant stenosis. 5. Cardiomegaly with apparent calcifications with the aortic valve leaflets as could be seen in the setting of aortic stenosis. Further evaluation with cardiac echo could be performed as clinically indicated. 6. Coronary artery calcifications. Aortic Atherosclerosis (ICD10-I70.0). NON-VASCULAR 1. Hepatic steatosis with mild nodularity hepatic contour as could be seen in the setting of early cirrhotic change. Correlation with LFTs is advised. 2. Apparent urinary bladder wall thickening could be attributable to underdistention in the presence of a Foley catheter though a cystitis could have a similar appearance. 3. Bilateral L5 pars defects with associated grade 1 anterolisthesis of L5 upon S1.   EKG: 09/24/2019 Rate 56 bpm  Sinus bradycardia Right bundle branch block  New since previous tracing    CV: Nuclear stress test 07/02/2019  There was no ST segment deviation noted during stress.  The study is normal. No myocardial ischemia or scar.  This is a low risk study.  Nuclear stress EF: 67%.   Echo 06/25/2019 IMPRESSIONS  1. Left ventricular ejection fraction, by visual estimation, is 65 to  70%. The left ventricle has hyperdynamic function. There is no left  ventricular hypertrophy.  2. Left ventricular diastolic parameters are indeterminate.  3. The left ventricle  has no regional wall motion abnormalities.  4. Global right ventricle has normal systolic function.The right  ventricular size is normal. No increase in right ventricular wall  thickness.  5. Left atrial size was mildly dilated.  6. Right atrial size was mildly dilated.  7. Mild to moderate mitral annular calcification.  8. The mitral valve is normal in structure. No evidence of mitral valve  regurgitation. No evidence of mitral stenosis.  9. The tricuspid valve is not well visualized.  10. The aortic valve is tricuspid. Aortic valve regurgitation is not  visualized. Mild to moderate aortic valve stenosis. Aortic valve mean  gradient measures 17.0 mmHg. Aortic valve peak gradient measures 33.4  mmHg. Aortic valve area, by VTI measures 1.38  cm.  11. Pulmonic regurgitation is mild.  12. The pulmonic valve was not well visualized. Pulmonic valve  regurgitation is mild.  13. The interatrial septum was not well visualized.   Cardiac cath 07/09/2007 - LM: Normal. - LAD: Widely patent stent in the proximal vessel.  The mid-LAD was calcified. There appeared to be a 40% to 50% calcific lesion at the takeoff of the D2 branch. Second diagonal branch itself appeared to have a 40% to 50% ostial  stenosis.  Distal LAD was somewhat of a small vessel.  - LCX: Large and left dominant.  There was a medium-sized intermediate branch with 30% to 40% multiple discrete lesions proximally.  The proximal CX itself had 20% multiple discrete lesions.  There was a very small OM1 with 70% to 80% diffuse distal disease at a bifurcation. This was not amenable to angioplasty.  The distal CX PDA, PLA x2 had 30% to 40% multiple discrete lesions. - RCA: Small and nondominant.  There is 20% to 30% multiple discrete lesions and mid-vessel. - LVEF 60%.   Past Medical History:  Diagnosis Date  . AAA (abdominal aortic aneurysm) ([place])   . Bladder cancer ([place])    s/p TURBT 09/26/19  . Blood transfusion without  reported diagnosis   . Coronary artery disease    stents placed  . GERD (gastroesophageal reflux disease)   . HOH (hard of hearing)   . Hyperlipidemia   . Hypertension   . Iron deficiency anemia 05/14/2018  . Polio    hx of. right arm atrophy    Past Surgical History:  Procedure Laterality Date  . CARDIAC CATHETERIZATION  07-09-2007  . CHOLECYSTECTOMY    . COLONOSCOPY WITH PROPOFOL N/A 05/31/2018   Procedure: COLONOSCOPY WITH PROPOFOL;  Surgeon: Rogene Houston, MD;  Location: [facility] ENDO SUITE;  Service: Endoscopy;  Laterality: N/A;  1:45  . CORONARY STENT PLACEMENT     x3 vessels bifurcated single vessel  . CYSTOSCOPY W/ RETROGRADES Bilateral 09/26/2019   Procedure: CYSTOSCOPY WITH RETROGRADE PYELOGRAM;  Surgeon: Alexis Frock, MD;  Location: [facility] ORS;  Service: Urology;  Laterality: Bilateral;  . ESOPHAGOGASTRODUODENOSCOPY (EGD) WITH PROPOFOL N/A 05/31/2018   Procedure: ESOPHAGOGASTRODUODENOSCOPY (EGD) WITH PROPOFOL;  Surgeon: Rogene Houston, MD;  Location: [facility];  Service: Endoscopy;  Laterality: N/A;  . ESOPHAGOGASTRODUODENOSCOPY (EGD) WITH PROPOFOL N/A 08/08/2019   Procedure: ESOPHAGOGASTRODUODENOSCOPY (EGD) WITH PROPOFOL;  Surgeon: Rogene Houston, MD;  Location: [facility];  Service: Endoscopy;  Laterality: N/A;  . KNEE SURGERY     Left knee  steel rod  . POLYPECTOMY  05/31/2018   Procedure: POLYPECTOMY;  Surgeon: Rogene Houston, MD;  Location: [facility];  Service: Endoscopy;;  sigmoid  . TRANSURETHRAL RESECTION OF BLADDER TUMOR N/A 09/26/2019   Procedure: TRANSURETHRAL RESECTION OF BLADDER TUMOR (TURBT);  Surgeon: Alexis Frock, MD;  Location: [facility] ORS;  Service: Urology;  Laterality: N/A;  1 HR    MEDICATIONS: . aspirin EC 81 MG tablet  . Cholecalciferol (VITAMIN D3 PO)  . ciprofloxacin (CIPRO) 500 MG tablet  . clopidogrel (PLAVIX) 75 MG tablet  . ELDERBERRY PO  . ferrous sulfate 325 (65 FE) MG tablet  . nitroGLYCERIN (NITROSTAT) 0.4 MG SL tablet  .  pantoprazole (PROTONIX) 40 MG tablet  . simvastatin (ZOCOR) 40 MG tablet  . tamsulosin (FLOMAX) 0.4 MG CAPS capsule   No current facility-administered medications for this encounter.     Myra Gianotti, PA-C Surgical Short Stay/Anesthesiology [facility] Phone [phone] [facility] Phone [phone] 10/10/2019 4:57 PM

## 2019-10-11 LAB — SARS CORONAVIRUS 2 (TAT 6-24 HRS): SARS Coronavirus 2: NEGATIVE

## 2019-10-14 ENCOUNTER — Encounter (HOSPITAL_COMMUNITY): Payer: Self-pay | Admitting: Vascular Surgery

## 2019-10-14 ENCOUNTER — Other Ambulatory Visit: Payer: Self-pay

## 2019-10-14 ENCOUNTER — Inpatient Hospital Stay (HOSPITAL_COMMUNITY)
Admission: RE | Admit: 2019-10-14 | Discharge: 2019-10-20 | DRG: 269 | Disposition: A | Discharge status: Home Health | Payer: Medicare HMO | Attending: Vascular Surgery | Admitting: Vascular Surgery

## 2019-10-14 ENCOUNTER — Encounter (HOSPITAL_COMMUNITY)
Admission: RE | Disposition: A | Discharge status: Home Health | Payer: Self-pay | Source: Home / Self Care | Attending: Vascular Surgery

## 2019-10-14 ENCOUNTER — Inpatient Hospital Stay (HOSPITAL_COMMUNITY): Payer: Medicare HMO | Admitting: Anesthesiology

## 2019-10-14 ENCOUNTER — Inpatient Hospital Stay (HOSPITAL_COMMUNITY): Payer: Medicare HMO | Admitting: Physician Assistant

## 2019-10-14 ENCOUNTER — Inpatient Hospital Stay (HOSPITAL_COMMUNITY): Payer: Medicare HMO

## 2019-10-14 DIAGNOSIS — I714 Abdominal aortic aneurysm, without rupture, unspecified: Secondary | ICD-10-CM | POA: Diagnosis present

## 2019-10-14 DIAGNOSIS — Z87891 Personal history of nicotine dependence: Secondary | ICD-10-CM | POA: Diagnosis not present

## 2019-10-14 DIAGNOSIS — Z955 Presence of coronary angioplasty implant and graft: Secondary | ICD-10-CM | POA: Diagnosis not present

## 2019-10-14 DIAGNOSIS — I10 Essential (primary) hypertension: Secondary | ICD-10-CM | POA: Diagnosis not present

## 2019-10-14 DIAGNOSIS — K219 Gastro-esophageal reflux disease without esophagitis: Secondary | ICD-10-CM | POA: Diagnosis present

## 2019-10-14 DIAGNOSIS — R079 Chest pain, unspecified: Secondary | ICD-10-CM | POA: Diagnosis not present

## 2019-10-14 DIAGNOSIS — K429 Umbilical hernia without obstruction or gangrene: Secondary | ICD-10-CM | POA: Diagnosis not present

## 2019-10-14 DIAGNOSIS — Z8249 Family history of ischemic heart disease and other diseases of the circulatory system: Secondary | ICD-10-CM

## 2019-10-14 DIAGNOSIS — Z8612 Personal history of poliomyelitis: Secondary | ICD-10-CM

## 2019-10-14 DIAGNOSIS — Z4682 Encounter for fitting and adjustment of non-vascular catheter: Secondary | ICD-10-CM | POA: Diagnosis not present

## 2019-10-14 DIAGNOSIS — I350 Nonrheumatic aortic (valve) stenosis: Secondary | ICD-10-CM | POA: Diagnosis not present

## 2019-10-14 DIAGNOSIS — D500 Iron deficiency anemia secondary to blood loss (chronic): Secondary | ICD-10-CM | POA: Diagnosis not present

## 2019-10-14 DIAGNOSIS — I872 Venous insufficiency (chronic) (peripheral): Secondary | ICD-10-CM | POA: Diagnosis present

## 2019-10-14 DIAGNOSIS — E785 Hyperlipidemia, unspecified: Secondary | ICD-10-CM | POA: Diagnosis not present

## 2019-10-14 DIAGNOSIS — Z79899 Other long term (current) drug therapy: Secondary | ICD-10-CM | POA: Diagnosis not present

## 2019-10-14 DIAGNOSIS — Z792 Long term (current) use of antibiotics: Secondary | ICD-10-CM

## 2019-10-14 DIAGNOSIS — I2510 Atherosclerotic heart disease of native coronary artery without angina pectoris: Secondary | ICD-10-CM | POA: Diagnosis not present

## 2019-10-14 DIAGNOSIS — R0602 Shortness of breath: Secondary | ICD-10-CM | POA: Diagnosis not present

## 2019-10-14 DIAGNOSIS — H9190 Unspecified hearing loss, unspecified ear: Secondary | ICD-10-CM | POA: Diagnosis present

## 2019-10-14 DIAGNOSIS — Z8551 Personal history of malignant neoplasm of bladder: Secondary | ICD-10-CM | POA: Diagnosis not present

## 2019-10-14 DIAGNOSIS — Q272 Other congenital malformations of renal artery: Secondary | ICD-10-CM

## 2019-10-14 HISTORY — PX: ABDOMINAL AORTIC ANEURYSM REPAIR: SHX42

## 2019-10-14 LAB — BLOOD GAS, ARTERIAL
Acid-Base Excess: 0.3 mmol/L (ref 0.0–2.0)
Bicarbonate: 26.7 mmol/L (ref 20.0–28.0)
FIO2: 44
O2 Saturation: 99 %
Patient temperature: 36.6
pCO2 arterial: 62.1 mmHg — ABNORMAL HIGH (ref 32.0–48.0)
pH, Arterial: 7.254 — ABNORMAL LOW (ref 7.350–7.450)
pO2, Arterial: 168 mmHg — ABNORMAL HIGH (ref 83.0–108.0)

## 2019-10-14 LAB — BASIC METABOLIC PANEL
Anion gap: 10 (ref 5–15)
BUN: 10 mg/dL (ref 8–23)
CO2: 25 mmol/L (ref 22–32)
Calcium: 9.3 mg/dL (ref 8.9–10.3)
Chloride: 105 mmol/L (ref 98–111)
Creatinine, Ser: 0.83 mg/dL (ref 0.61–1.24)
GFR calc Af Amer: >60 mL/min (ref >60)
GFR calc non Af Amer: >60 mL/min (ref >60)
Glucose, Bld: 187 mg/dL — ABNORMAL HIGH (ref 70–99)
Potassium: 4.5 mmol/L (ref 3.5–5.1)
Sodium: 140 mmol/L (ref 135–145)

## 2019-10-14 LAB — PROTIME-INR
INR: 1.2 (ref 0.8–1.2)
Prothrombin Time: 14.8 seconds (ref 11.4–15.2)

## 2019-10-14 LAB — APTT: aPTT: 28 seconds (ref 24–36)

## 2019-10-14 LAB — CBC
HCT: 43 % (ref 39.0–52.0)
Hemoglobin: 13.1 g/dL (ref 13.0–17.0)
MCH: 28.1 pg (ref 26.0–34.0)
MCHC: 30.5 g/dL (ref 30.0–36.0)
MCV: 92.1 fL (ref 80.0–100.0)
Platelets: 311 10*3/uL (ref 150–400)
RBC: 4.67 MIL/uL (ref 4.22–5.81)
RDW: 17.4 % — ABNORMAL HIGH (ref 11.5–15.5)
WBC: 29.4 10*3/uL — ABNORMAL HIGH (ref 4.0–10.5)
nRBC: 0 % (ref 0.0–0.2)

## 2019-10-14 LAB — PREPARE RBC (CROSSMATCH)

## 2019-10-14 LAB — MAGNESIUM: Magnesium: 1.7 mg/dL (ref 1.7–2.4)

## 2019-10-14 SURGERY — ANEURYSM ABDOMINAL AORTIC REPAIR
Anesthesia: General

## 2019-10-14 MED ORDER — ROCURONIUM BROMIDE 10 MG/ML (PF) SYRINGE
PREFILLED_SYRINGE | INTRAVENOUS | Status: DC | PRN
  Administered 2019-10-14: 50 mg via INTRAVENOUS
  Administered 2019-10-14: 20 mg via INTRAVENOUS
  Administered 2019-10-14: 30 mg via INTRAVENOUS
  Administered 2019-10-14: 100 mg via INTRAVENOUS
  Administered 2019-10-14: 30 mg via INTRAVENOUS

## 2019-10-14 MED ORDER — PHENYLEPHRINE 40 MCG/ML (10ML) SYRINGE FOR IV PUSH (FOR BLOOD PRESSURE SUPPORT)
PREFILLED_SYRINGE | INTRAVENOUS | Status: DC | PRN
  Administered 2019-10-14: 40 ug via INTRAVENOUS
  Administered 2019-10-14: 120 ug via INTRAVENOUS
  Administered 2019-10-14: 80 ug via INTRAVENOUS
  Administered 2019-10-14: 40 ug via INTRAVENOUS

## 2019-10-14 MED ORDER — HYDROMORPHONE HCL 1 MG/ML IJ SOLN
0.2500 mg | INTRAMUSCULAR | Status: DC | PRN
  Administered 2019-10-14 (×3): 0.25 mg via INTRAVENOUS

## 2019-10-14 MED ORDER — FENTANYL CITRATE (PF) 250 MCG/5ML IJ SOLN
INTRAMUSCULAR | Status: AC
  Filled 2019-10-14: qty 5

## 2019-10-14 MED ORDER — MANNITOL 25 % IV SOLN
INTRAVENOUS | Status: DC | PRN
  Administered 2019-10-14: 25 g via INTRAVENOUS

## 2019-10-14 MED ORDER — SODIUM CHLORIDE 0.9 % IV SOLN: 10.0000 mL/h | Freq: Once | INTRAVENOUS | Status: DC

## 2019-10-14 MED ORDER — ONDANSETRON HCL 4 MG/2ML IJ SOLN
INTRAMUSCULAR | Status: AC
  Filled 2019-10-14: qty 2

## 2019-10-14 MED ORDER — DEXAMETHASONE SODIUM PHOSPHATE 10 MG/ML IJ SOLN
INTRAMUSCULAR | Status: AC
  Filled 2019-10-14: qty 1

## 2019-10-14 MED ORDER — CALCIUM CHLORIDE 10 % IV SOLN
INTRAVENOUS | Status: DC | PRN
Start: 2019-10-14 — End: 2019-10-14
  Administered 2019-10-14 (×2): 100 mg via INTRAVENOUS
  Administered 2019-10-14 (×3): 200 mg via INTRAVENOUS

## 2019-10-14 MED ORDER — EPHEDRINE SULFATE-NACL 50-0.9 MG/10ML-% IV SOSY
PREFILLED_SYRINGE | INTRAVENOUS | Status: DC | PRN
  Administered 2019-10-14: 10 mg via INTRAVENOUS

## 2019-10-14 MED ORDER — ALBUMIN HUMAN 5 % IV SOLN: INTRAVENOUS | Status: DC | PRN

## 2019-10-14 MED ORDER — ALUM & MAG HYDROXIDE-SIMETH 200-200-20 MG/5ML PO SUSP
15.0000 mL | ORAL | Status: DC | PRN
  Filled 2019-10-14: qty 30

## 2019-10-14 MED ORDER — SUGAMMADEX SODIUM 200 MG/2ML IV SOLN
INTRAVENOUS | Status: DC | PRN
  Administered 2019-10-14: 200 mg via INTRAVENOUS

## 2019-10-14 MED ORDER — SODIUM CHLORIDE 0.9 % IV SOLN
INTRAVENOUS | Status: DC | PRN
  Administered 2019-10-14: 500 mL

## 2019-10-14 MED ORDER — CALCIUM CHLORIDE 10 % IV SOLN
INTRAVENOUS | Status: AC
  Filled 2019-10-14: qty 10

## 2019-10-14 MED ORDER — HEMOSTATIC AGENTS (NO CHARGE) OPTIME
TOPICAL | Status: DC | PRN
  Administered 2019-10-14: 1 via TOPICAL

## 2019-10-14 MED ORDER — ROCURONIUM BROMIDE 10 MG/ML (PF) SYRINGE
PREFILLED_SYRINGE | INTRAVENOUS | Status: AC
  Filled 2019-10-14: qty 10

## 2019-10-14 MED ORDER — GLYCOPYRROLATE PF 0.2 MG/ML IJ SOSY
PREFILLED_SYRINGE | INTRAMUSCULAR | Status: AC
  Filled 2019-10-14: qty 1

## 2019-10-14 MED ORDER — CEFAZOLIN SODIUM-DEXTROSE 2-4 GM/100ML-% IV SOLN
2.0000 g | INTRAVENOUS | Status: AC
  Administered 2019-10-14: 2 g via INTRAVENOUS

## 2019-10-14 MED ORDER — FENTANYL CITRATE (PF) 250 MCG/5ML IJ SOLN
INTRAMUSCULAR | Status: DC | PRN
  Administered 2019-10-14 (×2): 50 ug via INTRAVENOUS
  Administered 2019-10-14: 200 ug via INTRAVENOUS
  Administered 2019-10-14: 50 ug via INTRAVENOUS

## 2019-10-14 MED ORDER — SENNOSIDES-DOCUSATE SODIUM 8.6-50 MG PO TABS: 1.0000 | ORAL_TABLET | Freq: Every evening | ORAL | Status: DC | PRN

## 2019-10-14 MED ORDER — GUAIFENESIN-DM 100-10 MG/5ML PO SYRP: 15.0000 mL | ORAL_SOLUTION | ORAL | Status: DC | PRN

## 2019-10-14 MED ORDER — ONDANSETRON HCL 4 MG/2ML IJ SOLN
INTRAMUSCULAR | Status: DC | PRN
  Administered 2019-10-14: 4 mg via INTRAVENOUS

## 2019-10-14 MED ORDER — PHENYLEPHRINE 40 MCG/ML (10ML) SYRINGE FOR IV PUSH (FOR BLOOD PRESSURE SUPPORT)
PREFILLED_SYRINGE | INTRAVENOUS | Status: AC
  Filled 2019-10-14: qty 10

## 2019-10-14 MED ORDER — ROCURONIUM BROMIDE 10 MG/ML (PF) SYRINGE
PREFILLED_SYRINGE | INTRAVENOUS | Status: AC
  Filled 2019-10-14: qty 20

## 2019-10-14 MED ORDER — PHENYLEPHRINE HCL-NACL 10-0.9 MG/250ML-% IV SOLN
INTRAVENOUS | Status: DC | PRN
  Administered 2019-10-14: 25 ug/min via INTRAVENOUS

## 2019-10-14 MED ORDER — HYDRALAZINE HCL 20 MG/ML IJ SOLN
5.0000 mg | INTRAMUSCULAR | Status: AC | PRN
  Administered 2019-10-15 (×2): 5 mg via INTRAVENOUS
  Filled 2019-10-14 (×2): qty 1

## 2019-10-14 MED ORDER — LABETALOL HCL 5 MG/ML IV SOLN: 10.0000 mg | INTRAVENOUS | Status: DC | PRN

## 2019-10-14 MED ORDER — ONDANSETRON HCL 4 MG/2ML IJ SOLN: 4.0000 mg | Freq: Four times a day (QID) | INTRAMUSCULAR | Status: DC | PRN

## 2019-10-14 MED ORDER — PROPOFOL 10 MG/ML IV BOLUS
INTRAVENOUS | Status: DC | PRN
  Administered 2019-10-14 (×2): 100 mg via INTRAVENOUS

## 2019-10-14 MED ORDER — PANTOPRAZOLE SODIUM 40 MG IV SOLR
40.0000 mg | Freq: Every day | INTRAVENOUS | Status: DC
  Administered 2019-10-14 – 2019-10-16 (×3): 40 mg via INTRAVENOUS
  Filled 2019-10-14 (×3): qty 40

## 2019-10-14 MED ORDER — CLEVIDIPINE BUTYRATE 0.5 MG/ML IV EMUL
INTRAVENOUS | Status: DC | PRN
  Administered 2019-10-14: 3 mg/h via INTRAVENOUS

## 2019-10-14 MED ORDER — FLEET ENEMA 7-19 GM/118ML RE ENEM
1.0000 | ENEMA | Freq: Once | RECTAL | Status: DC | PRN
  Filled 2019-10-14: qty 1

## 2019-10-14 MED ORDER — LIDOCAINE 2% (20 MG/ML) 5 ML SYRINGE
INTRAMUSCULAR | Status: DC | PRN
  Administered 2019-10-14: 100 mg via INTRAVENOUS

## 2019-10-14 MED ORDER — DOCUSATE SODIUM 100 MG PO CAPS
100.0000 mg | ORAL_CAPSULE | Freq: Every day | ORAL | Status: DC
  Administered 2019-10-16 – 2019-10-20 (×5): 100 mg via ORAL
  Filled 2019-10-14 (×5): qty 1

## 2019-10-14 MED ORDER — GLYCOPYRROLATE PF 0.2 MG/ML IJ SOSY
PREFILLED_SYRINGE | INTRAMUSCULAR | Status: DC | PRN
  Administered 2019-10-14: .2 mg via INTRAVENOUS

## 2019-10-14 MED ORDER — VASOPRESSIN 20 UNIT/ML IV SOLN
INTRAVENOUS | Status: DC | PRN
  Administered 2019-10-14: 1 [IU] via INTRAVENOUS

## 2019-10-14 MED ORDER — PROTAMINE SULFATE 10 MG/ML IV SOLN
INTRAVENOUS | Status: DC | PRN
  Administered 2019-10-14: 10 mg via INTRAVENOUS
  Administered 2019-10-14: 20 mg via INTRAVENOUS

## 2019-10-14 MED ORDER — BISACODYL 5 MG PO TBEC: 5.0000 mg | DELAYED_RELEASE_TABLET | Freq: Every day | ORAL | Status: DC | PRN

## 2019-10-14 MED ORDER — LACTATED RINGERS IV SOLN: INTRAVENOUS | Status: DC | PRN

## 2019-10-14 MED ORDER — PROPOFOL 10 MG/ML IV BOLUS
INTRAVENOUS | Status: AC
  Filled 2019-10-14: qty 20

## 2019-10-14 MED ORDER — METOPROLOL TARTRATE 5 MG/5ML IV SOLN: 2.0000 mg | INTRAVENOUS | Status: DC | PRN

## 2019-10-14 MED ORDER — ESMOLOL HCL 100 MG/10ML IV SOLN
INTRAVENOUS | Status: DC | PRN
  Administered 2019-10-14 (×2): 20 mg via INTRAVENOUS

## 2019-10-14 MED ORDER — MIDAZOLAM HCL 2 MG/2ML IJ SOLN
INTRAMUSCULAR | Status: AC
  Filled 2019-10-14: qty 2

## 2019-10-14 MED ORDER — LIDOCAINE 2% (20 MG/ML) 5 ML SYRINGE
INTRAMUSCULAR | Status: AC
  Filled 2019-10-14: qty 5

## 2019-10-14 MED ORDER — METOPROLOL TARTRATE 5 MG/5ML IV SOLN
INTRAVENOUS | Status: AC
  Filled 2019-10-14: qty 5

## 2019-10-14 MED ORDER — POTASSIUM CHLORIDE CRYS ER 20 MEQ PO TBCR: 20.0000 meq | EXTENDED_RELEASE_TABLET | Freq: Every day | ORAL | Status: DC | PRN

## 2019-10-14 MED ORDER — 0.9 % SODIUM CHLORIDE (POUR BTL) OPTIME
TOPICAL | Status: DC | PRN
  Administered 2019-10-14: 2000 mL
  Administered 2019-10-14: 1000 mL

## 2019-10-14 MED ORDER — HEPARIN SODIUM (PORCINE) 1000 UNIT/ML IJ SOLN
INTRAMUSCULAR | Status: AC
  Filled 2019-10-14: qty 1

## 2019-10-14 MED ORDER — MAGNESIUM SULFATE 2 GM/50ML IV SOLN: 2.0000 g | Freq: Every day | INTRAVENOUS | Status: DC | PRN

## 2019-10-14 MED ORDER — METOPROLOL TARTRATE 5 MG/5ML IV SOLN
2.5000 mg | Freq: Once | INTRAVENOUS | Status: AC
  Administered 2019-10-14: 2.5 mg via INTRAVENOUS

## 2019-10-14 MED ORDER — ONDANSETRON HCL 4 MG/2ML IJ SOLN: 4.0000 mg | Freq: Once | INTRAMUSCULAR | Status: DC | PRN

## 2019-10-14 MED ORDER — ESMOLOL HCL 100 MG/10ML IV SOLN
INTRAVENOUS | Status: AC
  Filled 2019-10-14: qty 10

## 2019-10-14 MED ORDER — SODIUM CHLORIDE 0.9 % IV SOLN: INTRAVENOUS | Status: DC

## 2019-10-14 MED ORDER — ACETAMINOPHEN 325 MG PO TABS
325.0000 mg | ORAL_TABLET | ORAL | Status: DC | PRN
  Administered 2019-10-16 – 2019-10-19 (×6): 650 mg via ORAL
  Filled 2019-10-14 (×6): qty 2

## 2019-10-14 MED ORDER — MORPHINE SULFATE (PF) 2 MG/ML IV SOLN
2.0000 mg | INTRAVENOUS | Status: DC | PRN
  Administered 2019-10-14 – 2019-10-15 (×3): 4 mg via INTRAVENOUS
  Administered 2019-10-15 – 2019-10-16 (×8): 2 mg via INTRAVENOUS
  Filled 2019-10-14: qty 1
  Filled 2019-10-14: qty 2
  Filled 2019-10-14 (×5): qty 1
  Filled 2019-10-14 (×3): qty 2
  Filled 2019-10-14 (×3): qty 1

## 2019-10-14 MED ORDER — SODIUM CHLORIDE 0.9 % IV SOLN: 500.0000 mL | Freq: Once | INTRAVENOUS | Status: DC | PRN

## 2019-10-14 MED ORDER — MIDAZOLAM HCL 5 MG/5ML IJ SOLN
INTRAMUSCULAR | Status: DC | PRN
  Administered 2019-10-14: 2 mg via INTRAVENOUS

## 2019-10-14 MED ORDER — MEPERIDINE HCL 25 MG/ML IJ SOLN: 6.2500 mg | INTRAMUSCULAR | Status: DC | PRN

## 2019-10-14 MED ORDER — PHENOL 1.4 % MT LIQD: 1.0000 | OROMUCOSAL | Status: DC | PRN

## 2019-10-14 MED ORDER — HYDRALAZINE HCL 20 MG/ML IJ SOLN
INTRAMUSCULAR | Status: DC | PRN
  Administered 2019-10-14 (×2): 5 mg via INTRAVENOUS

## 2019-10-14 MED ORDER — CEFAZOLIN SODIUM-DEXTROSE 2-4 GM/100ML-% IV SOLN
INTRAVENOUS | Status: AC
  Filled 2019-10-14: qty 100

## 2019-10-14 MED ORDER — DEXAMETHASONE SODIUM PHOSPHATE 10 MG/ML IJ SOLN
INTRAMUSCULAR | Status: DC | PRN
  Administered 2019-10-14: 4 mg via INTRAVENOUS

## 2019-10-14 MED ORDER — METOPROLOL TARTRATE 5 MG/5ML IV SOLN: 2.5000 mg | INTRAVENOUS | Status: DC | PRN

## 2019-10-14 MED ORDER — CHLORHEXIDINE GLUCONATE CLOTH 2 % EX PADS
6.0000 | MEDICATED_PAD | Freq: Every day | CUTANEOUS | Status: DC
  Administered 2019-10-14 – 2019-10-19 (×6): 6 via TOPICAL

## 2019-10-14 MED ORDER — HYDROMORPHONE HCL 1 MG/ML IJ SOLN
INTRAMUSCULAR | Status: AC
  Filled 2019-10-14: qty 1

## 2019-10-14 MED ORDER — VASOPRESSIN 20 UNIT/ML IV SOLN
INTRAVENOUS | Status: AC
  Filled 2019-10-14: qty 1

## 2019-10-14 MED ORDER — METOPROLOL TARTRATE 5 MG/5ML IV SOLN
5.0000 mg | Freq: Four times a day (QID) | INTRAVENOUS | Status: DC
  Administered 2019-10-14 – 2019-10-20 (×22): 5 mg via INTRAVENOUS
  Filled 2019-10-14 (×22): qty 5

## 2019-10-14 MED ORDER — CEFAZOLIN SODIUM-DEXTROSE 2-4 GM/100ML-% IV SOLN
2.0000 g | Freq: Three times a day (TID) | INTRAVENOUS | Status: AC
  Administered 2019-10-14 (×2): 2 g via INTRAVENOUS
  Filled 2019-10-14 (×2): qty 100

## 2019-10-14 MED ORDER — HEPARIN SODIUM (PORCINE) 1000 UNIT/ML IJ SOLN
INTRAMUSCULAR | Status: DC | PRN
  Administered 2019-10-14: 8500 [IU] via INTRAVENOUS
  Administered 2019-10-14: 2000 [IU] via INTRAVENOUS

## 2019-10-14 MED ORDER — PROTAMINE SULFATE 10 MG/ML IV SOLN
INTRAVENOUS | Status: AC
  Filled 2019-10-14: qty 5

## 2019-10-14 MED ORDER — EPHEDRINE 5 MG/ML INJ
INTRAVENOUS | Status: AC
  Filled 2019-10-14: qty 10

## 2019-10-14 MED ORDER — ACETAMINOPHEN 325 MG RE SUPP
325.0000 mg | RECTAL | Status: DC | PRN
  Filled 2019-10-14: qty 2

## 2019-10-14 MED ORDER — METOPROLOL TARTRATE 5 MG/5ML IV SOLN
2.5000 mg | Freq: Once | INTRAVENOUS | Status: AC
  Administered 2019-10-14: 2.5 mg via INTRAVENOUS
  Filled 2019-10-14: qty 5

## 2019-10-14 MED ORDER — SODIUM CHLORIDE 0.9 % IV SOLN
INTRAVENOUS | Status: AC
  Filled 2019-10-14: qty 1.2

## 2019-10-14 MED ORDER — CHLORHEXIDINE GLUCONATE CLOTH 2 % EX PADS: 6.0000 | MEDICATED_PAD | Freq: Once | CUTANEOUS | Status: DC

## 2019-10-14 MED ORDER — OXYCODONE HCL 5 MG PO TABS
5.0000 mg | ORAL_TABLET | ORAL | Status: DC | PRN
  Administered 2019-10-16: 5 mg via ORAL
  Administered 2019-10-17: 10 mg via ORAL
  Administered 2019-10-17: 5 mg via ORAL
  Filled 2019-10-14 (×2): qty 1
  Filled 2019-10-14 (×2): qty 2

## 2019-10-14 MED ORDER — SODIUM CHLORIDE 0.9 % IV SOLN: INTRAVENOUS | Status: DC | PRN

## 2019-10-14 SURGICAL SUPPLY — 65 items
ADH SKN CLS APL DERMABOND .7 (GAUZE/BANDAGES/DRESSINGS) ×3
AGENT HMST 10 BLLW SHRT CANN (HEMOSTASIS) ×1
CANISTER SUCT 3000ML PPV (MISCELLANEOUS) ×2 IMPLANT
CANNULA VESSEL 3MM 2 BLNT TIP (CANNULA) ×4 IMPLANT
CLIP VESOCCLUDE LG 6/CT (CLIP) ×1 IMPLANT
CLIP VESOCCLUDE MED 24/CT (CLIP) ×2 IMPLANT
CLIP VESOCCLUDE SM WIDE 24/CT (CLIP) ×2 IMPLANT
DERMABOND ADVANCED (GAUZE/BANDAGES/DRESSINGS) ×3
DERMABOND ADVANCED .7 DNX12 (GAUZE/BANDAGES/DRESSINGS) IMPLANT
ELECT BLADE 4.0 EZ CLEAN MEGAD (MISCELLANEOUS) ×2
ELECT BLADE 6.5 EXT (BLADE) IMPLANT
ELECT REM PT RETURN 9FT ADLT (ELECTROSURGICAL) ×2
ELECTRODE BLDE 4.0 EZ CLN MEGD (MISCELLANEOUS) ×1 IMPLANT
ELECTRODE REM PT RTRN 9FT ADLT (ELECTROSURGICAL) ×1 IMPLANT
FELT TEFLON 1X6 (MISCELLANEOUS) IMPLANT
GLOVE BIO SURGEON STRL SZ 6.5 (GLOVE) ×1 IMPLANT
GLOVE BIO SURGEON STRL SZ7.5 (GLOVE) ×3 IMPLANT
GLOVE BIOGEL PI IND STRL 6.5 (GLOVE) IMPLANT
GLOVE BIOGEL PI IND STRL 7.0 (GLOVE) IMPLANT
GLOVE BIOGEL PI IND STRL 8 (GLOVE) ×1 IMPLANT
GLOVE BIOGEL PI INDICATOR 6.5 (GLOVE) ×1
GLOVE BIOGEL PI INDICATOR 7.0 (GLOVE) ×1
GLOVE BIOGEL PI INDICATOR 8 (GLOVE) ×1
GOWN STRL REUS W/ TWL LRG LVL3 (GOWN DISPOSABLE) ×3 IMPLANT
GOWN STRL REUS W/ TWL XL LVL3 (GOWN DISPOSABLE) IMPLANT
GOWN STRL REUS W/TWL LRG LVL3 (GOWN DISPOSABLE) ×8
GOWN STRL REUS W/TWL XL LVL3 (GOWN DISPOSABLE) ×2
GRAFT HEMASHIELD 16MM (Vascular Products) ×1 IMPLANT
HEMOSTAT HEMOBLAST BELLOWS (HEMOSTASIS) ×1 IMPLANT
INSERT FOGARTY 61MM (MISCELLANEOUS) ×4 IMPLANT
INSERT FOGARTY SM (MISCELLANEOUS) ×7 IMPLANT
KIT BASIN OR (CUSTOM PROCEDURE TRAY) ×2 IMPLANT
KIT TURNOVER KIT B (KITS) ×2 IMPLANT
NS IRRIG 1000ML POUR BTL (IV SOLUTION) ×5 IMPLANT
PACK AORTA (CUSTOM PROCEDURE TRAY) ×2 IMPLANT
PAD ARMBOARD 7.5X6 YLW CONV (MISCELLANEOUS) ×4 IMPLANT
RETAINER VISCERA MED (MISCELLANEOUS) ×2 IMPLANT
SPONGE SURGIFOAM ABS GEL 100 (HEMOSTASIS) IMPLANT
STAPLER VISISTAT (STAPLE) ×2 IMPLANT
SUT ETHIBOND 5 LR DA (SUTURE) ×2 IMPLANT
SUT PDS AB 1 TP1 54 (SUTURE) ×4 IMPLANT
SUT PROLENE 3 0 SH 48 (SUTURE) ×8 IMPLANT
SUT PROLENE 3 0 SH DA (SUTURE) ×4 IMPLANT
SUT PROLENE 5 0 C 1 24 (SUTURE) ×1 IMPLANT
SUT PROLENE 5 0 C 1 36 (SUTURE) ×7 IMPLANT
SUT PROLENE 6 0 BV (SUTURE) ×4 IMPLANT
SUT SILK 2 0 (SUTURE) ×2
SUT SILK 2 0 SH CR/8 (SUTURE) ×2 IMPLANT
SUT SILK 2-0 18XBRD TIE 12 (SUTURE) IMPLANT
SUT SILK 3 0 (SUTURE) ×2
SUT SILK 3-0 18XBRD TIE 12 (SUTURE) IMPLANT
SUT SILK 4 0 (SUTURE) ×2
SUT SILK 4-0 18XBRD TIE 12 (SUTURE) IMPLANT
SUT VIC AB 2-0 CT1 27 (SUTURE) ×4
SUT VIC AB 2-0 CT1 TAPERPNT 27 (SUTURE) IMPLANT
SUT VIC AB 2-0 CTB1 (SUTURE) ×4 IMPLANT
SUT VIC AB 3-0 SH 27 (SUTURE) ×4
SUT VIC AB 3-0 SH 27X BRD (SUTURE) IMPLANT
SUT VIC AB 4-0 PS2 18 (SUTURE) ×3 IMPLANT
TOWEL GREEN STERILE (TOWEL DISPOSABLE) ×2 IMPLANT
TOWEL GREEN STERILE FF (TOWEL DISPOSABLE) ×1 IMPLANT
TOWEL OR NON WOVEN STRL DISP B (DISPOSABLE) ×1 IMPLANT
TOWEL SURG RFD BLUE STRL DISP (DISPOSABLE) ×1 IMPLANT
TRAY FOLEY MTR SLVR 16FR STAT (SET/KITS/TRAYS/PACK) ×2 IMPLANT
WATER STERILE IRR 1000ML POUR (IV SOLUTION) ×4 IMPLANT

## 2019-10-14 NOTE — Anesthesia Procedure Notes (Signed)
Arterial Line Insertion Start/End5/09/2019 6:57 AM, 10/14/2019 6:57 AM Performed by: Renato Shin, CRNA, CRNA  Patient location: Pre-op. Preanesthetic checklist: patient identified, IV checked, site marked, risks and benefits discussed, surgical consent, monitors and equipment checked, pre-op evaluation, timeout performed and anesthesia consent Lidocaine 1% used for infiltration Left was placed Catheter size: 20 G Hand hygiene performed , maximum sterile barriers used  and Seldinger technique used Allen's test indicative of satisfactory collateral circulation Attempts: 1 Procedure performed without using ultrasound guided technique. Following insertion, dressing applied and Biopatch. Post procedure assessment: normal  Patient tolerated the procedure well with no immediate complications.

## 2019-10-14 NOTE — Op Note (Signed)
NAME: COURTENAY LOWERS    MRN: [id number] DOB: 12/30/1940    DATE OF OPERATION: 10/14/2019  PREOP DIAGNOSIS:    6 cm juxtarenal abdominal aortic aneurysm  POSTOP DIAGNOSIS:    Same  PROCEDURE:    Repair of juxtarenal abdominal aortic aneurysm Oversewing of left renal vein  SURGEON: Judeth Cornfield. Scot Dock, MD  ASSIST: .Corie [place], [place]  ANESTHESIA: General  EBL: 1200 cc  INDICATIONS:    DAELIN MAIRE is a 79 y.o. male who had an incidental finding of a 6 cm juxtarenal abdominal aortic aneurysm that was found for a work-up of hematuria.  He had a bladder cancer removed and presents for elective repair of his aneurysm.  FINDINGS:   The suprarenal clamp time was 23 minutes.  There was significant calcific disease at the pararenal aorta and distal aorta and proximal iliac arteries.  He had palpable dorsalis pedis pulses at the completion of the procedure.  TECHNIQUE:   The patient was taken to the operating room after monitoring lines were placed by anesthesia.  The patient received a general anesthetic.  The abdomen and groins were prepped and draped in usual sterile fashion.  The abdomen was entered through a midline incision.  There was some omentum that was stuck up into a periumbilical hernia that was dissected free.  Exploratory laparotomy was performed and no other significant intra-abdominal findings were noted except for this large abdominal aortic aneurysm.  The retroperitoneal tissue was divided separating the duodenum from the proximal aorta.  The superior mesenteric vein was divided between 2-0 silk ties.  The retractor system was placed in the transverse colon reflected superiorly and the small bowel reflected to the right.  The dissection was carried up to the level of the renal vein.  Given that this was a juxtarenal aneurysm and I anticipated suprarenal clamp placement the left renal vein was clamped at each end and divided.  Each end was oversewn with a running 5-0  Prolene suture.  This allowed better exposure of the pararenal aorta.  The left renal artery came off anteriorly and was somewhat plastered to the neck of the aneurysm making dissection difficult.  There were 2 renal arteries on the right side and I was able to control both of these with a vessel loop.  I was concerned about getting around the left renal artery and given that he was 82 I felt it might be best to avoid a suprarenal clamp.  I therefore felt that I could clamp just below the renals.  Distally the dissection was carried down to the common iliac arteries.  Both common iliac arteries were dissected free to where I could place a clamp safely.  The patient was heparinized and received 25 g of mannitol.  The clamp was placed just below the renals after distal clamps had been placed on the common iliac arteries.  The artery was opened longitudinally and teed off proximally.  Lumbars were oversewn with 2-0 silk sutures.  The IMA had been ligated and divided.  I selected a 16 mm graft given that the aorta distally was smaller in the aorta proximally was larger I thought this was a reasonable compromise for the 2 sides.  Proximal left the posterior wall intact and the graft was sewn end to end to the juxtarenal aorta using 3-0 Prolene's.  Once I had completed the back wall I used a pledget anteriorly and completed the anastomosis.  I tested the anastomosis and I think there was  some calcium in the pararenal aorta which should correct the aorta slightly near the anastomosis.  I repaired this by placing a suprarenal clamp and controlling the renals on the right.  I then placed multiple pledgeted 3-0 Prolene sutures in the areas of concern and at this point there was good hemostasis when the clamp was released.  The graft then pulled the appropriate length for anastomosis to the distal aorta.  The artery was teed off distally and the graft was sewn into into the distal aorta using a continuous 3-0 Prolene suture.   Prior to completing this anastomosis the arteries were backbled and flushed appropriately and the anastomosis completed.  Flow was reestablished to both legs which the patient tolerated from a hemodynamic standpoint.  At this point there is good Doppler signals in the iliac arteries and the 2 right renal arteries it was difficult to position the Doppler on the left renal artery but there was a reasonable Doppler signal in the left renal artery which did have a proximal stenosis also.  The heparin was partially reversed with protamine.  I then closed the aneurysm sac over the graft with a running 2-0 Vicryl suture.  The retroperitoneal tissue was then closed with a running 2-0 Vicryl suture.  I ran the small bowel and the abdominal contents returned to the normal position.  The fascial leg was then closed with two #1 PDS sutures.  The subcutaneous layer was closed to 3-0 Vicryl's.  The skin was closed with two 4-0 Vicryl's.  At the completion there were palpable dorsalis pedis pulses.  Patient was making good urine output.  Patient was hemodynamically stable and was transferred to the recovery room in stable condition.  All needle and sponge counts were correct.  Deitra Mayo, MD, FACS [facility]  DATE OF DICTATION:   10/14/2019

## 2019-10-14 NOTE — Interval H&P Note (Signed)
History and Physical Interval Note:  10/14/2019 7:17 AM  [name]  has presented today for surgery, with the diagnosis of ABDOMINAL AORTIC ANEURYSM.  The various methods of treatment have been discussed with the patient and family. After consideration of risks, benefits and other options for treatment, the patient has consented to  Procedure(s): OPEN ANEURYSM ABDOMINAL AORTIC REPAIR (N/A) as a surgical intervention.  The patient's history has been reviewed, patient examined, no change in status, stable for surgery.  I have reviewed the patient's chart and labs.  Questions were answered to the patient's satisfaction.     Deitra Mayo

## 2019-10-14 NOTE — Progress Notes (Addendum)
  Progress Note    10/14/2019 1:54 PM Day of Surgery  Subjective:  Seen in PACU with no complaints. Very drowsy post operatively   Vitals:   10/14/19 1315 10/14/19 1330  BP: 131/64 131/69  Pulse: (!) 124 (!) 102  Resp: (!) 23 (!) 24  Temp:    SpO2: 99% 100%   Physical Exam: General: drowsy, not in any acute distress Cardiac: tachycardic Lungs:  Non labored Incisions: Abdominal incision clean, dry and intact Extremities:  2+ radial pulses. Moving all extremities. Bilateral lower extremities warm and well perfused. 2+ DP pulses bilaterally Abdomen: non distended, soft, incision intact   CBC    Component Value Date/Time   WBC 29.4 (H) 10/14/2019 1153   RBC 4.67 10/14/2019 1153   HGB 13.1 10/14/2019 1153   HCT 43.0 10/14/2019 1153   PLT 311 10/14/2019 1153   MCV 92.1 10/14/2019 1153   MCH 28.1 10/14/2019 1153   MCHC 30.5 10/14/2019 1153   RDW 17.4 (H) 10/14/2019 1153   LYMPHSABS 2.1 09/06/2019 2150   MONOABS 0.9 09/06/2019 2150   EOSABS 1.4 (H) 09/06/2019 2150   BASOSABS 0.1 09/06/2019 2150    BMET    Component Value Date/Time   NA 140 10/14/2019 1153   K 4.5 10/14/2019 1153   CL 105 10/14/2019 1153   CO2 25 10/14/2019 1153   GLUCOSE 187 (H) 10/14/2019 1153   BUN 10 10/14/2019 1153   CREATININE 0.83 10/14/2019 1153   CREATININE 0.73 08/06/2019 1254   CALCIUM 9.3 10/14/2019 1153   GFRNONAA >60 10/14/2019 1153   GFRNONAA 89 08/06/2019 1254   GFRAA >60 10/14/2019 1153   GFRAA 103 08/06/2019 1254    INR    Component Value Date/Time   INR 1.2 10/14/2019 1153     Intake/Output Summary (Last 24 hours) at 10/14/2019 1354 Last data filed at 10/14/2019 1128 Gross per 24 hour  Intake 3680 ml  Output 2450 ml  Net 1230 ml     Assessment/Plan:  79 y.o. male is s/p repair of juxtarenal abdominal aortic aneurysm Day of Surgery. Doing well in recovery room.  Tachycardic but otherwise hemodynamically stable. Pain well controlled. Abdominal incision clean,dry and  intact. Bilateral lower extremities well perfused with palpable DP pulses bilaterally - to 2H this afternoon   Karoline Caldwell, PA-C [facility] [phone] 10/14/2019   I have interviewed the patient and examined the patient. I agree with the findings by the PA.  Gae Gallop, MD [phone]  1:54 PM

## 2019-10-14 NOTE — Progress Notes (Signed)
Ring removed, placed in a labeled pink denture box and in a labeled plastic bag and given to wife, Karen Kitchens.

## 2019-10-14 NOTE — Anesthesia Procedure Notes (Signed)
Central Venous Catheter Insertion Performed by: Roderic Palau, MD, anesthesiologist Start/End5/09/2019 7:00 AM, 10/14/2019 7:10 AM Patient location: Pre-op. Preanesthetic checklist: patient identified, IV checked, site marked, risks and benefits discussed, surgical consent, monitors and equipment checked, pre-op evaluation, timeout performed and anesthesia consent Position: Trendelenburg Lidocaine 1% used for infiltration and patient sedated Hand hygiene performed , maximum sterile barriers used  and Seldinger technique used Catheter size: 8 Fr Total catheter length 16. Central line was placed.Double lumen Procedure performed using ultrasound guided technique. Ultrasound Notes:anatomy identified, needle tip was noted to be adjacent to the nerve/plexus identified, no ultrasound evidence of intravascular and/or intraneural injection and image(s) printed for medical record Attempts: 1 Following insertion, dressing applied, line sutured and Biopatch. Post procedure assessment: blood return through all ports  Patient tolerated the procedure well with no immediate complications.

## 2019-10-14 NOTE — Anesthesia Postprocedure Evaluation (Signed)
Anesthesia Post Note  Patient: Gwyndolyn Saxon A Freedman  Procedure(s) Performed: OPEN ABDOMINAL AORTIC ANEURYSM REPAIR USING HEMASHIELD GOLD 10mm x 30cm GRAFT (N/A )     Patient location during evaluation: PACU Anesthesia Type: General Level of consciousness: awake and alert Pain management: pain level controlled Vital Signs Assessment: post-procedure vital signs reviewed and stable Respiratory status: spontaneous breathing, nonlabored ventilation, respiratory function stable and patient connected to nasal cannula oxygen Cardiovascular status: blood pressure returned to baseline and stable Postop Assessment: no apparent nausea or vomiting Anesthetic complications: no    Last Vitals:  Vitals:   10/14/19 1439 10/14/19 1500  BP: 125/71 131/78  Pulse: (!) 109 (!) 109  Resp: 20 (!) 28  Temp:    SpO2: 95% 95%    Last Pain:  Vitals:   10/14/19 1500  TempSrc:   PainSc: 7                  Nechama Escutia DAVID

## 2019-10-14 NOTE — Transfer of Care (Signed)
Immediate Anesthesia Transfer of Care Note  Patient: [name]  Procedure(s) Performed: OPEN ABDOMINAL AORTIC ANEURYSM REPAIR USING HEMASHIELD GOLD 26mm x 30cm GRAFT (N/A )  Patient Location: PACU  Anesthesia Type:General  Level of Consciousness: awake, drowsy and patient cooperative  Airway & Oxygen Therapy: Patient Spontanous Breathing and Patient connected to face mask oxygen  Post-op Assessment: Report given to RN, Post -op Vital signs reviewed and stable and Patient moving all extremities X 4  Post vital signs: Reviewed and stable  Last Vitals:  Vitals Value Taken Time  BP 151/73 10/14/19 1200  Temp 36.6 C 10/14/19 1200  Pulse 127 10/14/19 1207  Resp 20 10/14/19 1207  SpO2 100 % 10/14/19 1207  Vitals shown include unvalidated device data.  Last Pain:  Vitals:   10/14/19 1200  TempSrc:   PainSc: 0-No pain      Patients Stated Pain Goal: 4 ([phone] [id number])  Complications: No apparent anesthesia complications

## 2019-10-14 NOTE — Anesthesia Procedure Notes (Signed)
Procedure Name: Intubation Date/Time: 10/14/2019 7:39 AM Performed by: Renato Shin, CRNA Pre-anesthesia Checklist: Patient identified, Emergency Drugs available, Suction available and Patient being monitored Patient Re-evaluated:Patient Re-evaluated prior to induction Oxygen Delivery Method: Circle system utilized Preoxygenation: Pre-oxygenation with 100% oxygen Induction Type: IV induction Ventilation: Mask ventilation without difficulty Laryngoscope Size: Miller and 3 Grade View: Grade I Tube type: Oral Tube size: 8.0 mm Number of attempts: 1 Airway Equipment and Method: Stylet and Oral airway Placement Confirmation: ETT inserted through vocal cords under direct vision,  positive ETCO2 and breath sounds checked- equal and bilateral Secured at: 21 cm Tube secured with: Tape Dental Injury: Teeth and Oropharynx as per pre-operative assessment

## 2019-10-14 NOTE — Progress Notes (Signed)
Family notified of patients location. 2H22, VS stable, receiving RN at bedside.  Rowe Pavy, RN

## 2019-10-15 ENCOUNTER — Inpatient Hospital Stay (HOSPITAL_COMMUNITY): Payer: Medicare HMO

## 2019-10-15 LAB — COMPREHENSIVE METABOLIC PANEL
ALT: 15 U/L (ref 0–44)
AST: 19 U/L (ref 15–41)
Albumin: 3.2 g/dL — ABNORMAL LOW (ref 3.5–5.0)
Alkaline Phosphatase: 47 U/L (ref 38–126)
Anion gap: 8 (ref 5–15)
BUN: 13 mg/dL (ref 8–23)
CO2: 28 mmol/L (ref 22–32)
Calcium: 8.9 mg/dL (ref 8.9–10.3)
Chloride: 104 mmol/L (ref 98–111)
Creatinine, Ser: 0.63 mg/dL (ref 0.61–1.24)
GFR calc Af Amer: >60 mL/min (ref >60)
GFR calc non Af Amer: >60 mL/min (ref >60)
Glucose, Bld: 150 mg/dL — ABNORMAL HIGH (ref 70–99)
Potassium: 4.6 mmol/L (ref 3.5–5.1)
Sodium: 140 mmol/L (ref 135–145)
Total Bilirubin: 0.5 mg/dL (ref 0.3–1.2)
Total Protein: 5.8 g/dL — ABNORMAL LOW (ref 6.5–8.1)

## 2019-10-15 LAB — POCT I-STAT 7, (LYTES, BLD GAS, ICA,H+H)
Acid-Base Excess: 3 mmol/L — ABNORMAL HIGH (ref 0.0–2.0)
Acid-Base Excess: 1 mmol/L (ref 0.0–2.0)
Acid-base deficit: 1 mmol/L (ref 0.0–2.0)
Bicarbonate: 27.9 mmol/L (ref 20.0–28.0)
Bicarbonate: 26.6 mmol/L (ref 20.0–28.0)
Bicarbonate: 27.4 mmol/L (ref 20.0–28.0)
Calcium, Ion: 1.18 mmol/L (ref 1.15–1.40)
Calcium, Ion: 1.4 mmol/L (ref 1.15–1.40)
Calcium, Ion: 1.33 mmol/L (ref 1.15–1.40)
HCT: 35 % — ABNORMAL LOW (ref 39.0–52.0)
HCT: 38 % — ABNORMAL LOW (ref 39.0–52.0)
HCT: 42 % (ref 39.0–52.0)
Hemoglobin: 11.9 g/dL — ABNORMAL LOW (ref 13.0–17.0)
Hemoglobin: 12.9 g/dL — ABNORMAL LOW (ref 13.0–17.0)
Hemoglobin: 14.3 g/dL (ref 13.0–17.0)
O2 Saturation: 100 %
O2 Saturation: 99 %
O2 Saturation: 93 %
Patient temperature: 35.9
Patient temperature: 36.8
Patient temperature: 97.5
Potassium: 3.6 mmol/L (ref 3.5–5.1)
Potassium: 4.1 mmol/L (ref 3.5–5.1)
Potassium: 4 mmol/L (ref 3.5–5.1)
Sodium: 140 mmol/L (ref 135–145)
Sodium: 137 mmol/L (ref 135–145)
Sodium: 140 mmol/L (ref 135–145)
TCO2: 29 mmol/L (ref 22–32)
TCO2: 28 mmol/L (ref 22–32)
TCO2: 29 mmol/L (ref 22–32)
pCO2 arterial: 42.7 mmHg (ref 32.0–48.0)
pCO2 arterial: 45.2 mmHg (ref 32.0–48.0)
pCO2 arterial: 58.7 mmHg — ABNORMAL HIGH (ref 32.0–48.0)
pH, Arterial: 7.419 (ref 7.350–7.450)
pH, Arterial: 7.376 (ref 7.350–7.450)
pH, Arterial: 7.274 — ABNORMAL LOW (ref 7.350–7.450)
pO2, Arterial: 178 mmHg — ABNORMAL HIGH (ref 83.0–108.0)
pO2, Arterial: 161 mmHg — ABNORMAL HIGH (ref 83.0–108.0)
pO2, Arterial: 76 mmHg — ABNORMAL LOW (ref 83.0–108.0)

## 2019-10-15 LAB — POCT ACTIVATED CLOTTING TIME
Activated Clotting Time: 142 seconds
Activated Clotting Time: 202 seconds
Activated Clotting Time: 103 seconds

## 2019-10-15 LAB — CBC
HCT: 41.4 % (ref 39.0–52.0)
Hemoglobin: 12.5 g/dL — ABNORMAL LOW (ref 13.0–17.0)
MCH: 28 pg (ref 26.0–34.0)
MCHC: 30.2 g/dL (ref 30.0–36.0)
MCV: 92.6 fL (ref 80.0–100.0)
Platelets: 291 10*3/uL (ref 150–400)
RBC: 4.47 MIL/uL (ref 4.22–5.81)
RDW: 17.1 % — ABNORMAL HIGH (ref 11.5–15.5)
WBC: 18.8 10*3/uL — ABNORMAL HIGH (ref 4.0–10.5)
nRBC: 0 % (ref 0.0–0.2)

## 2019-10-15 LAB — AMYLASE: Amylase: 54 U/L (ref 28–100)

## 2019-10-15 LAB — MAGNESIUM: Magnesium: 1.8 mg/dL (ref 1.7–2.4)

## 2019-10-15 MED ORDER — SODIUM CHLORIDE 0.9% FLUSH: 10.0000 mL | INTRAVENOUS | Status: DC | PRN

## 2019-10-15 MED ORDER — SODIUM CHLORIDE 0.9% FLUSH
10.0000 mL | Freq: Two times a day (BID) | INTRAVENOUS | Status: DC
  Administered 2019-10-15: 30 mL
  Administered 2019-10-16 – 2019-10-19 (×5): 10 mL

## 2019-10-15 NOTE — Evaluation (Signed)
Physical Therapy Evaluation Patient Details Name: [name] MRN: [id number] DOB: 07/19/1940 Today's Date: 10/15/2019   History of Present Illness  Pt is a 79 yo male s/p AAA repair. PMHx: CAD, bladder tumor with recent (4/16) TURBT, HTN, polio with nonfunctional RUE, lt knee/femur ORIF  Clinical Impression  Pt admitted with above diagnosis and presents to PT with functional limitations due to deficits listed below (See PT problem list). Pt needs skilled PT to maximize independence and safety to allow discharge to home with wife. Expect steady progress. Will not be able to use walker due to nonfunctional RUE from polio.      Follow Up Recommendations Home health PT;Supervision/Assistance - 24 hour    Equipment Recommendations  Other (comment)(To be determined)    Recommendations for Other Services       Precautions / Restrictions Precautions Precautions: Fall      Mobility  Bed Mobility               General bed mobility comments: Pt up in chair  Transfers Overall transfer level: Needs assistance Equipment used: 4-wheeled walker Transfers: Sit to/from Stand Sit to Stand: +2 physical assistance;Mod assist         General transfer comment: Assist to bring hips up and for balance. Pt with nonfunctional RUE and multiple lines/tubes on rt hand/wrist making it difficult to use  Ambulation/Gait Ambulation/Gait assistance: Min assist Gait Distance (Feet): 110 Feet Assistive device: 4-wheeled walker;1 person hand held assist Gait Pattern/deviations: Step-through pattern;Decreased stride length Gait velocity: decr Gait velocity interpretation: 1.31 - 2.62 ft/sec, indicative of limited community ambulator General Gait Details: Used rollator with pt pushing with LUE. Assist for balance, lines, and to guide rollator. Once close to chair leg rollator and amb with hand held with improved gait pattern.  Stairs            Wheelchair Mobility    Modified Rankin  (Stroke Patients Only)       Balance Overall balance assessment: Needs assistance Sitting-balance support: No upper extremity supported;Feet supported Sitting balance-Leahy Scale: Fair     Standing balance support: Single extremity supported Standing balance-Leahy Scale: Poor Standing balance comment: UE support                             Pertinent Vitals/Pain Pain Assessment: Faces Faces Pain Scale: Hurts a little bit Pain Location: abdominal incision Pain Descriptors / Indicators: Operative site guarding;Sore Pain Intervention(s): Limited activity within patient's tolerance;Monitored during session    [place] expects to be discharged to:: Private residence Living Arrangements: Spouse/significant other Available Help at Discharge: Family;Available 24 hours/day Type of Home: House Home Access: Stairs to enter Entrance Stairs-Rails: Psychiatric nurse of Steps: 3-4 Home Layout: One level Home Equipment: Grab bars - toilet;Grab bars - tub/shower;Shower seat(lift chair)      Prior Function Level of Independence: Needs assistance   Gait / Transfers Assistance Needed: Amb without assistive device. Difficulty getting up at times           Hand Dominance   Dominant Hand: Left    Extremity/Trunk Assessment   Upper Extremity Assessment Upper Extremity Assessment: Defer to OT evaluation    Lower Extremity Assessment Lower Extremity Assessment: Generalized weakness       Communication   Communication: HOH  Cognition Arousal/Alertness: Awake/alert Behavior During Therapy: Flat affect Overall Cognitive Status: Difficult to assess  General Comments: Pt's wife answering many times for patient. Unsure if pt is slow to process or if  this is normal dynamic.      General Comments General comments (skin integrity, edema, etc.): SpO2 98% on RA. Removed O2 and SpO2 dropped to  87%. Amb on 1L O2 with SpO2 >90%. HR and BP stable    Exercises     Assessment/Plan    PT Assessment Patient needs continued PT services  PT Problem List Decreased strength;Decreased activity tolerance;Decreased balance;Decreased mobility       PT Treatment Interventions DME instruction;Gait training;Stair training;Functional mobility training;Therapeutic activities;Therapeutic exercise;Balance training;Patient/family education    PT Goals (Current goals can be found in the Care Plan section)  Acute Rehab PT Goals Patient Stated Goal: be able to garden PT Goal Formulation: With patient/family Time For Goal Achievement: 10/29/19 Potential to Achieve Goals: Good    Frequency Min 3X/week   Barriers to discharge Inaccessible home environment stairs to enter    Co-evaluation PT/OT/SLP Co-Evaluation/Treatment: Yes Reason for Co-Treatment: Complexity of the patient's impairments (multi-system involvement);For patient/therapist safety PT goals addressed during session: Mobility/safety with mobility         AM-PAC PT "6 Clicks" Mobility  Outcome Measure Help needed turning from your back to your side while in a flat bed without using bedrails?: A Little Help needed moving from lying on your back to sitting on the side of a flat bed without using bedrails?: A Lot Help needed moving to and from a bed to a chair (including a wheelchair)?: A Lot Help needed standing up from a chair using your arms (e.g., wheelchair or bedside chair)?: A Lot Help needed to walk in hospital room?: A Little Help needed climbing 3-5 steps with a railing? : Total 6 Click Score: 13    End of Session Equipment Utilized During Treatment: Gait belt;Oxygen Activity Tolerance: Patient tolerated treatment well Patient left: in chair;with call bell/phone within reach;with family/visitor present Nurse Communication: Mobility status PT Visit Diagnosis: Unsteadiness on feet (R26.81);Other abnormalities of gait  and mobility (R26.89);Muscle weakness (generalized) (M62.81)    Time: [id number] PT Time Calculation (min) (ACUTE ONLY): 40 min   Charges:   PT Evaluation $PT Eval Moderate Complexity: 1 Mod PT Treatments $Gait Training: 8-22 mins        [place] Pager [phone] Office [place] 10/15/2019, 1:05 PM

## 2019-10-15 NOTE — Progress Notes (Signed)
Patient to room from [facility]. Vital signs obtained. CHG bath completed. On monitor CCMD notified. Alert and oriented to room and call light. Call bell within reach Paulene Floor, RN

## 2019-10-15 NOTE — Evaluation (Signed)
Occupational Therapy Evaluation Patient Details Name: [name] MRN: [id number] DOB: [birth year] Today's Date: 10/15/2019    History of Present Illness Pt is a 79 yo male s/p AAA repair. PMHx: CAD, bladder tumor with recent (4/16) TURBT, HTN, polio with nonfunctional RUE, lt knee/femur ORIF   Clinical Impression   Pt PTA: pt was independent with ADL and moblity, home with spouse. Pt currently limited by A line in LUE, decreased strength and decreased activity tolerance. Pt minA +2 for mobility with reminders to walk closer to AD with LUE holding on as RUE supported by therapist. PT with questionable cognition and would like to test further. Pt would benefit from continued OT skilled services. OT following acutely.       Follow Up Recommendations  Home health OT;Supervision/Assistance - 24 hour(initially)    Equipment Recommendations  None recommended by OT    Recommendations for Other Services       Precautions / Restrictions Precautions Precautions: Fall;Other (comment) Precaution Comments: RUE nonfunctional s/p polio Restrictions Weight Bearing Restrictions: No      Mobility Bed Mobility               General bed mobility comments: Pt up in chair  Transfers Overall transfer level: Needs assistance Equipment used: 4-wheeled walker Transfers: Sit to/from Stand Sit to Stand: +2 physical assistance;Mod assist         General transfer comment: Assist to bring hips up and for balance. Pt with nonfunctional RUE and multiple lines/tubes on rt hand/wrist making it difficult to use    Balance Overall balance assessment: Needs assistance Sitting-balance support: No upper extremity supported;Feet supported Sitting balance-Leahy Scale: Fair     Standing balance support: Single extremity supported Standing balance-Leahy Scale: Poor Standing balance comment: LUE support; cues to stand closer to it                           ADL either performed or  assessed with clinical judgement   ADL Overall ADL's : Needs assistance/impaired Eating/Feeding: Set up;Sitting   Grooming: Min guard;Standing   Upper Body Bathing: Minimal assistance;Sitting;Standing   Lower Body Bathing: Moderate assistance;Cueing for safety;Cueing for sequencing;Sitting/lateral leans;Sit to/from stand   Upper Body Dressing : Minimal assistance;Cueing for sequencing;Standing   Lower Body Dressing: Moderate assistance;Cueing for sequencing;Sitting/lateral leans;Sit to/from stand   Toilet Transfer: Minimal assistance;+2 for physical assistance;+2 for safety/equipment;Ambulation;RW;BSC   Toileting- Clothing Manipulation and Hygiene: Moderate assistance;Cueing for safety;Cueing for sequencing;Sitting/lateral lean;Sit to/from stand       Functional mobility during ADLs: Minimal assistance;+2 for physical assistance;+2 for safety/equipment;Cueing for safety;Cueing for sequencing;Rolling walker General ADL Comments: Pt limited by A line in LUE, decreased strength and decreased activity tolerance.      Vision Baseline Vision/History: No visual deficits Patient Visual Report: No change from baseline Vision Assessment?: No apparent visual deficits     Perception     Praxis      Pertinent Vitals/Pain Pain Assessment: Faces Faces Pain Scale: Hurts a little bit Pain Location: abdominal incision Pain Descriptors / Indicators: Operative site guarding;Sore Pain Intervention(s): Limited activity within patient's tolerance;Monitored during session     Hand Dominance Left   Extremity/Trunk Assessment Upper Extremity Assessment Upper Extremity Assessment: Generalized weakness;RUE deficits/detail;LUE deficits/detail RUE Deficits / Details: s/p polio, PROM, WFLs RUE Coordination: decreased fine motor;decreased gross motor LUE Deficits / Details: A line in arm, otherwise, AROM is WFLs   Lower Extremity Assessment Lower Extremity Assessment: Defer to PT  evaluation;Generalized weakness   Cervical / Trunk Assessment Cervical / Trunk Assessment: Normal   Communication Communication Communication: HOH   Cognition Arousal/Alertness: Awake/alert Behavior During Therapy: Flat affect Overall Cognitive Status: Difficult to assess                                 General Comments: Pt with very few verbalizations; spouse answering most questions - continue to assess cognition.   General Comments  SpO2 98% on RA. Removed O2 and SpO2 dropped to 87%. Amb on 1L O2 with SpO2 >90%. HR and BP stable    Exercises     Shoulder Instructions      Home Living Family/patient expects to be discharged to:: Private residence Living Arrangements: Spouse/significant other Available Help at Discharge: Family;Available 24 hours/day Type of Home: House Home Access: Stairs to enter [employer] of Steps: 3-4 Entrance Stairs-Rails: Right;Left Home Layout: One level     Bathroom Shower/Tub: Occupational psychologist: Handicapped height     Home Equipment: Grab bars - toilet;Grab bars - tub/shower;Shower seat(lift chair)          Prior Functioning/Environment Level of Independence: Needs assistance  Gait / Transfers Assistance Needed: Amb without assistive device. Difficulty getting up at times ADL's / Homemaking Assistance Needed: independent            OT Problem List: Decreased activity tolerance;Pain;Impaired UE functional use;Increased edema;Decreased cognition;Impaired balance (sitting and/or standing);Decreased strength;Decreased knowledge of use of DME or AE      OT Treatment/Interventions: Self-care/ADL training;Therapeutic exercise;Neuromuscular education;Energy conservation;Therapeutic activities;Cognitive remediation/compensation;Patient/family education;Balance training;DME and/or AE instruction    OT Goals(Current goals can be found in the care plan section) Acute Rehab OT Goals Patient Stated Goal:  be able to garden OT Goal Formulation: With patient Time For Goal Achievement: 10/29/19 Potential to Achieve Goals: Good ADL Goals Pt Will Perform Grooming: with supervision;standing Pt Will Perform Lower Body Dressing: with min guard assist;sitting/lateral leans;sit to/from stand Pt/caregiver will Perform Home Exercise Program: Increased strength;Left upper extremity;With Supervision Additional ADL Goal #1: Pt will recall 3 energy conservation strategies to utilize for ADL and mobility.  OT Frequency: Min 2X/week   Barriers to D/C:            Co-evaluation PT/OT/SLP Co-Evaluation/Treatment: Yes Reason for Co-Treatment: Complexity of the patient's impairments (multi-system involvement);To address functional/ADL transfers   OT goals addressed during session: ADL's and self-care      AM-PAC OT "6 Clicks" Daily Activity     Outcome Measure Help from another person eating meals?: A Little Help from another person taking care of personal grooming?: A Little Help from another person toileting, which includes using toliet, bedpan, or urinal?: A Lot Help from another person bathing (including washing, rinsing, drying)?: A Lot Help from another person to put on and taking off regular upper body clothing?: A Little Help from another person to put on and taking off regular lower body clothing?: A Lot 6 Click Score: 15   End of Session Equipment Utilized During Treatment: Gait belt;Other (comment)(rollator in LUE only) Nurse Communication: Mobility status  Activity Tolerance: Patient tolerated treatment well Patient left: in chair;with call bell/phone within reach;with family/visitor present  OT Visit Diagnosis: Unsteadiness on feet (R26.81);Muscle weakness (generalized) (M62.81);Pain Pain - part of body: (abdomen)                Time: [id number] OT Time Calculation (min): 21 min Charges:  OT  General Charges $OT Visit: 1 Visit OT Evaluation $OT Eval Moderate Complexity: 1  Mod  Jefferey Pica, OTR/L Acute Rehabilitation Services Pager: [phone] Office: [phone]   Jayke Caul C 10/15/2019, 4:38 PM

## 2019-10-15 NOTE — Progress Notes (Signed)
   VASCULAR SURGERY ASSESSMENT & PLAN:   POD 1 - OPEN AAA REPAIR: Overall patient is doing well.  CARDIAC: Hemodynamically stable.  PULMONARY: Extubated with good oxygenation.  RENAL: He has good urine output and his renal function is normal.  GI/NUTRITION: He does have some bowel sounds this morning.  He only put out 50 cc from his NG tube.  We will discontinue his NG tube but keep n.p.o.  Transfer to 4 E. and start to mobilize.   SUBJECTIVE:   Pain well controlled.  No flatus.  Mild nausea.  PHYSICAL EXAM:   Vitals:   10/15/19 0400 10/15/19 0415 10/15/19 0430 10/15/19 0445  BP: 130/66     Pulse: 93 86 80 84  Resp: (!) 28 (!) 27 (!) 25 19  Temp: 98.4 F (36.9 C)     TempSrc: Oral     SpO2: 98% 99% 99% 100%  Weight:      Height:       I/O's 1700 cc positive yesterday.  LUNGS: Clear ABDOMEN: His incision looks fine.  He has normal pitched bowel sounds. VASCULAR: He has palpable dorsalis pedis pulses.  LABS:   Lab Results  Component Value Date   WBC 18.8 (H) 10/15/2019   HGB 12.5 (L) 10/15/2019   HCT 41.4 10/15/2019   MCV 92.6 10/15/2019   PLT 291 10/15/2019   Lab Results  Component Value Date   CREATININE 0.63 10/15/2019    PROBLEM LIST:    Active Problems:   Abdominal aortic aneurysm (AAA) (HCC)   Abdominal aortic aneurysm (AAA) greater than 5.5 cm in diameter in male [facility])   CURRENT MEDS:   . Chlorhexidine Gluconate Cloth  6 each Topical Daily  . docusate sodium  100 mg Oral Daily  . metoprolol tartrate  5 mg Intravenous Q6H  . pantoprazole (PROTONIX) IV  40 mg Intravenous QHS  . sodium chloride flush  10-40 mL Intracatheter Q12H    Deitra Mayo Office: [phone] 10/15/2019

## 2019-10-16 LAB — TYPE AND SCREEN
ABO/RH(D): O POS
Antibody Screen: NEGATIVE
Unit division: 0
Unit division: 0
Unit division: 0
Unit division: 0

## 2019-10-16 LAB — BPAM RBC
Blood Product Expiration Date: 202106012359
Blood Product Expiration Date: 202106022359
Blood Product Expiration Date: 202106032359
Blood Product Expiration Date: 202106032359
ISSUE DATE / TIME: 202105040218
ISSUE DATE / TIME: 202105040808
ISSUE DATE / TIME: 202105040808
Unit Type and Rh: 5100
Unit Type and Rh: 5100
Unit Type and Rh: 5100
Unit Type and Rh: 5100

## 2019-10-16 MED ORDER — BISACODYL 10 MG RE SUPP: 10.0000 mg | Freq: Every day | RECTAL | Status: DC | PRN

## 2019-10-16 NOTE — Progress Notes (Signed)
Physical Therapy Treatment Patient Details Name: [name] MRN: [id number] DOB: 07-Jan-1941 Today's Date: 10/16/2019    History of Present Illness Pt is a 79 yo male s/p AAA repair. PMHx: CAD, bladder tumor with recent (4/16) TURBT, HTN, polio with nonfunctional RUE, lt knee/femur ORIF    PT Comments    Patient progressing with mobility and able to walk better with HHA than with RW.  Once not so sore and with less lines will trial cane versus one loftrand crutch.  PT to follow, wife present and likely able to supervise upon d/c.   Follow Up Recommendations  Home health PT;Supervision/Assistance - 24 hour     Equipment Recommendations  Other (comment)(TBA (lofstrand crutch vs cane))    Recommendations for Other Services       Precautions / Restrictions Precautions Precautions: Fall Precaution Comments: RUE nonfunctional s/p polio    Mobility  Bed Mobility Overal bed mobility: Needs Assistance Bed Mobility: Rolling;Sidelying to Sit Rolling: Mod assist;+2 for safety/equipment Sidelying to sit: Mod assist;+2 for safety/equipment       General bed mobility comments: assist to roll to L due to R arm unable to assist, max cues for technique, assist for up to EOB cues for pushing with L elbow and bracing with feet to lift trunk  Transfers Overall transfer level: Needs assistance Equipment used: Rolling walker (2 wheeled);1 person hand held assist;2 person hand held assist Transfers: Sit to/from Stand Sit to Stand: Mod assist;+2 safety/equipment         General transfer comment: lifting help from EOB  Ambulation/Gait Ambulation/Gait assistance: Min assist Gait Distance (Feet): 118 Feet Assistive device: Rolling walker (2 wheeled);2 person hand held assist;1 person hand held assist Gait Pattern/deviations: Step-through pattern;Decreased stride length;Trunk flexed     General Gait Details: assist with saftery with walkerinitially then used L HHA and occasional R HHA  +2 for lines with O2 and IV and for safety   Stairs             Wheelchair Mobility    Modified Rankin (Stroke Patients Only)       Balance Overall balance assessment: Needs assistance Sitting-balance support: No upper extremity supported;Feet supported Sitting balance-Leahy Scale: Good     Standing balance support: Single extremity supported Standing balance-Leahy Scale: Poor Standing balance comment: L UE support and min a for static balance                            Cognition Arousal/Alertness: Awake/alert   Overall Cognitive Status: Impaired/Different from baseline Area of Impairment: Problem solving;Safety/judgement;Following commands                       Following Commands: Follows one step commands with increased time Safety/Judgement: Decreased awareness of deficits   Problem Solving: Slow processing;Requires verbal cues;Difficulty sequencing        Exercises      General Comments General comments (skin integrity, edema, etc.): on O2 with ambulation since when turned off at rest dropped to 87%      Pertinent Vitals/Pain Pain Assessment: Faces Faces Pain Scale: Hurts a little bit Pain Location: abdominal incision Pain Descriptors / Indicators: Operative site guarding;Sore Pain Intervention(s): Repositioned;Monitored during session    Home Living                      Prior Function  PT Goals (current goals can now be found in the care plan section) Progress towards PT goals: Progressing toward goals    Frequency    Min 3X/week      PT Plan Current plan remains appropriate    Co-evaluation              AM-PAC PT "6 Clicks" Mobility   Outcome Measure  Help needed turning from your back to your side while in a flat bed without using bedrails?: A Lot Help needed moving from lying on your back to sitting on the side of a flat bed without using bedrails?: A Lot Help needed moving to and  from a bed to a chair (including a wheelchair)?: A Lot Help needed standing up from a chair using your arms (e.g., wheelchair or bedside chair)?: A Lot Help needed to walk in hospital room?: A Lot Help needed climbing 3-5 steps with a railing? : Total 6 Click Score: 11    End of Session Equipment Utilized During Treatment: Oxygen Activity Tolerance: Patient tolerated treatment well Patient left: in chair;with call bell/phone within reach Nurse Communication: Mobility status PT Visit Diagnosis: Other abnormalities of gait and mobility (R26.89);Muscle weakness (generalized) (M62.81)     Time: [id number] PT Time Calculation (min) (ACUTE ONLY): 26 min  Charges:  $Gait Training: 8-22 mins $Therapeutic Activity: 8-22 mins                     Magda Kiel, PT [place] [phone] 10/16/2019    Reginia Naas 10/16/2019, 1:18 PM

## 2019-10-16 NOTE — Progress Notes (Addendum)
  Progress Note  VASCULAR SURGERY ASSESSMENT & PLAN:   POD 2 - OPEN AAA REPAIR:  Palpable pedal pulses.  Abdominal incision looks good.  Hypoactive bowel sounds.  CARDIAC: No further problems with tachycardia.  He is on a beta-blocker.  PULMONARY: Encourage IS.  Mobilize.  RENAL: He has good urine output.  Postop creatinine was normal.  I have ordered follow-up labs for tomorrow.  GI/NUTRITION: He is thirsty.  Still not passing flatus nor has he moved his bowels.  We will give him a suppository today and start clear liquids slowly.  PHYSICAL THERAPY: He has generalized weakness and physical therapy feels that he will need home health physical therapy with 24-hour assistance.  If he progresses slowly he may even need a short stay in a skilled nursing facility.  DVT PROPHYLAXIS: He is on subcu heparin.  VASCULAR QUALITY INITIATIVE: Aspirin and statin when he is taking p.o.  Deitra Mayo, MD Office: [phone]   10/16/2019 7:42 AM 2 Days Post-Op  Subjective:  Slightly confused this morning. Complaining of back and shoulder pain and dry mouth   Vitals:   10/16/19 0000 10/16/19 0321  BP: 122/61 137/67  Pulse: 92 92  Resp: 17 (!) 21  Temp: 98.3 F (36.8 C) 98.1 F (36.7 C)  SpO2: 98% 98%   Physical Exam: General: well appearing, well nourished, not in any acute distress Cardiac:  regular Lungs:  Clear to auscultation bilaterally, non labored Incisions:  Midline incision is clean, dry and intact Extremities: extremities well perfused and warm. Palpable DP pulses bilaterally Abdomen:  Soft, non distended, mildly tender Neurologic: slightly confused but responds appropriately   Intake/Output Summary (Last 24 hours) at 10/16/2019 0742 Last data filed at 10/16/2019 0400 Gross per 24 hour  Intake 2727.87 ml  Output 945 ml  Net 1782.87 ml     Assessment/Plan:  79 y.o. male is s/p open AAA repair 2 Days Post-Op. Slightly confused this morning. Will continue to  monitor. He remains hemodynamically stable. No flatus. Keep NPO for now. Pain seems well controlled. Mobilize today   DVT Prophylaxis: SCDs   Karoline Caldwell, [place] [facility] [phone] 10/16/2019 7:42 AM

## 2019-10-17 LAB — CBC
HCT: 37.1 % — ABNORMAL LOW (ref 39.0–52.0)
Hemoglobin: 11 g/dL — ABNORMAL LOW (ref 13.0–17.0)
MCH: 27.4 pg (ref 26.0–34.0)
MCHC: 29.6 g/dL — ABNORMAL LOW (ref 30.0–36.0)
MCV: 92.5 fL (ref 80.0–100.0)
Platelets: 254 10*3/uL (ref 150–400)
RBC: 4.01 MIL/uL — ABNORMAL LOW (ref 4.22–5.81)
RDW: 16.5 % — ABNORMAL HIGH (ref 11.5–15.5)
WBC: 11.4 10*3/uL — ABNORMAL HIGH (ref 4.0–10.5)
nRBC: 0 % (ref 0.0–0.2)

## 2019-10-17 LAB — BASIC METABOLIC PANEL
Anion gap: 8 (ref 5–15)
BUN: 11 mg/dL (ref 8–23)
CO2: 33 mmol/L — ABNORMAL HIGH (ref 22–32)
Calcium: 8.6 mg/dL — ABNORMAL LOW (ref 8.9–10.3)
Chloride: 98 mmol/L (ref 98–111)
Creatinine, Ser: 0.45 mg/dL — ABNORMAL LOW (ref 0.61–1.24)
GFR calc Af Amer: >60 mL/min (ref >60)
GFR calc non Af Amer: >60 mL/min (ref >60)
Glucose, Bld: 133 mg/dL — ABNORMAL HIGH (ref 70–99)
Potassium: 3.2 mmol/L — ABNORMAL LOW (ref 3.5–5.1)
Sodium: 139 mmol/L (ref 135–145)

## 2019-10-17 MED ORDER — ASPIRIN EC 81 MG PO TBEC
81.0000 mg | DELAYED_RELEASE_TABLET | Freq: Every day | ORAL | Status: DC
  Administered 2019-10-17 – 2019-10-20 (×4): 81 mg via ORAL
  Filled 2019-10-17 (×4): qty 1

## 2019-10-17 MED ORDER — SIMVASTATIN 20 MG PO TABS
40.0000 mg | ORAL_TABLET | Freq: Every day | ORAL | Status: DC
  Administered 2019-10-17 – 2019-10-19 (×3): 40 mg via ORAL
  Filled 2019-10-17 (×3): qty 2

## 2019-10-17 MED ORDER — POTASSIUM CHLORIDE CRYS ER 20 MEQ PO TBCR
20.0000 meq | EXTENDED_RELEASE_TABLET | Freq: Two times a day (BID) | ORAL | Status: AC
  Administered 2019-10-17 – 2019-10-18 (×4): 20 meq via ORAL
  Filled 2019-10-17 (×4): qty 1

## 2019-10-17 MED ORDER — PANTOPRAZOLE SODIUM 40 MG PO TBEC
40.0000 mg | DELAYED_RELEASE_TABLET | Freq: Every day | ORAL | Status: DC
  Administered 2019-10-17 – 2019-10-19 (×3): 40 mg via ORAL
  Filled 2019-10-17 (×3): qty 1

## 2019-10-17 NOTE — Progress Notes (Addendum)
  Progress Note    10/17/2019 8:23 AM 3 Days Post-Op  Subjective:  Tolerating clear liquids without nausea yesterday.  Passing flatus.   Vitals:   10/17/19 0801 10/17/19 0812  BP: (!) 161/75 (!) 152/67  Pulse: 70 67  Resp: (!) 27 (!) 25  Temp: 98 F (36.7 C)   SpO2: 92% 91%   Physical Exam: Lungs:  Non labored on 1L by [place] Incisions:  abd incision c/d/i Extremities:  Palpable DP pulses Abdomen:  Soft, NT, ND Neurologic: A&O  CBC    Component Value Date/Time   WBC 11.4 (H) 10/17/2019 0600   RBC 4.01 (L) 10/17/2019 0600   HGB 11.0 (L) 10/17/2019 0600   HCT 37.1 (L) 10/17/2019 0600   PLT 254 10/17/2019 0600   MCV 92.5 10/17/2019 0600   MCH 27.4 10/17/2019 0600   MCHC 29.6 (L) 10/17/2019 0600   RDW 16.5 (H) 10/17/2019 0600   LYMPHSABS 2.1 09/06/2019 2150   MONOABS 0.9 09/06/2019 2150   EOSABS 1.4 (H) 09/06/2019 2150   BASOSABS 0.1 09/06/2019 2150    BMET    Component Value Date/Time   NA 139 10/17/2019 0600   K 3.2 (L) 10/17/2019 0600   CL 98 10/17/2019 0600   CO2 33 (H) 10/17/2019 0600   GLUCOSE 133 (H) 10/17/2019 0600   BUN 11 10/17/2019 0600   CREATININE 0.45 (L) 10/17/2019 0600   CREATININE 0.73 08/06/2019 1254   CALCIUM 8.6 (L) 10/17/2019 0600   GFRNONAA >60 10/17/2019 0600   GFRNONAA 89 08/06/2019 1254   GFRAA >60 10/17/2019 0600   GFRAA 103 08/06/2019 1254    INR    Component Value Date/Time   INR 1.2 10/14/2019 1153     Intake/Output Summary (Last 24 hours) at 10/17/2019 0823 Last data filed at 10/17/2019 0200 Gross per 24 hour  Intake 120 ml  Output 1275 ml  Net -1155 ml     Assessment/Plan:  79 y.o. male is s/p open AAA repair 3 Days Post-Op   BLE well perfused with palpable DP pulses Pulm: Sat of 90 on 1L by [place]; encouraged IS, OOB Renal: Good UOP; Cr is within normal limits GI: Passing flatus; tolerating clears; advance to full liquids this afternoon Restarted aspirin and statin PT recommending HH PT  DVT prophylaxis:  subq  heparin   Dagoberto Ligas, PA-C [facility] [phone] 10/17/2019 8:23 AM  I have interviewed the patient and examined the patient. I agree with the findings by the PA. Advance diet. Mobilize. Supplement K.    Gae Gallop, MD [phone]

## 2019-10-17 NOTE — Progress Notes (Signed)
Occupational Therapy Treatment Patient Details Name: [name] MRN: [id number] DOB: [birth year] Today's Date: 10/17/2019    History of present illness Pt is a 79 yo male s/p AAA repair. PMHx: CAD, bladder tumor with recent (4/16) TURBT, HTN, polio with nonfunctional RUE, lt knee/femur ORIF   OT comments  Pt. Seen for skilled OT.  Wife present for session. Pt. Requiring 2 person assist for bed mobility and functional transfers.  Provided energy conservation handouts for implementation during adl completion.  Will cont. To progress activity tolerance during adls next session.    Initial BP: 161/75-98 (RN aware of pre-tx. BP and stated transfer to chair was okay) BP after transfer to chair: 152/67-91   Follow Up Recommendations  Home health OT;Supervision/Assistance - 24 hour    Equipment Recommendations  None recommended by OT    Recommendations for Other Services      Precautions / Restrictions Precautions Precautions: Fall Precaution Comments: RUE nonfunctional s/p polio       Mobility Bed Mobility Overal bed mobility: Needs Assistance Bed Mobility: Rolling;Sidelying to Sit Rolling: Mod assist;+2 for safety/equipment Sidelying to sit: Mod assist;+2 for safety/equipment       General bed mobility comments: assist to roll to L due to R arm unable to assist, max cues for technique, assist for up to EOB cues for pushing with L elbow and bracing with feet to lift trunk  Transfers Overall transfer level: Needs assistance Equipment used: 2 person hand held assist Transfers: Sit to/from [employer] Transfers Sit to Stand: Mod assist;+2 safety/equipment Stand pivot transfers: Mod assist;+2 safety/equipment       General transfer comment: lifting help from EOB, pt. able to stand approx. 10 -15 seconds prior to pivot. controlled decend to chair with cues required.    Balance                                           ADL either performed or  assessed with clinical judgement   ADL Overall ADL's : Needs assistance/impaired Eating/Feeding: Set up;Sitting Eating/Feeding Details (indicate cue type and reason): wife present and he motioned to her to set up his tray and she assisted with b.fast set up                     Toilet Transfer: Minimal assistance;+2 for physical assistance;+2 for safety/equipment;Ambulation Toilet Transfer Details (indicate cue type and reason): simulated with transfer from eob to recliner         Functional mobility during ADLs: Minimal assistance;+2 for physical assistance;+2 for safety/equipment;Cueing for safety;Cueing for sequencing General ADL Comments: preparation for standing grooming tasks with pt. participation in bed mobilty and si/stand, pivot transfers to recliner, provided energy conservation handouts for pt. and spouse to review     Vision       Perception     Praxis      Cognition Arousal/Alertness: Awake/alert Behavior During Therapy: Flat affect                                            Exercises     Shoulder Instructions       General Comments      Pertinent Vitals/ Pain       Pain Assessment: No/denies pain  Home  Living                                          Prior Functioning/Environment              Frequency  Min 2X/week        Progress Toward Goals  OT Goals(current goals can now be found in the care plan section)  Progress towards OT goals: Progressing toward goals     Plan Discharge plan remains appropriate    Co-evaluation                 AM-PAC OT "6 Clicks" Daily Activity     Outcome Measure   Help from another person eating meals?: A Little Help from another person taking care of personal grooming?: A Little Help from another person toileting, which includes using toliet, bedpan, or urinal?: A Lot Help from another person bathing (including washing, rinsing, drying)?: A  Lot Help from another person to put on and taking off regular upper body clothing?: A Little Help from another person to put on and taking off regular lower body clothing?: A Lot 6 Click Score: 15    End of Session Equipment Utilized During Treatment: Gait belt;Oxygen  OT Visit Diagnosis: Unsteadiness on feet (R26.81);Muscle weakness (generalized) (M62.81);Pain   Activity Tolerance Patient tolerated treatment well   Patient Left in chair;with call bell/phone within reach;with family/visitor present;with nursing/sitter in room   Nurse Communication Other (comment)(reported initial elevated BP, rn in room and states it is ok to transfer to chair to take BP after)        Time: [id number] OT Time Calculation (min): 15 min  Charges: OT General Charges $OT Visit: 1 Visit OT Treatments $Self Care/Home Management : 8-22 mins  Sonia Baller, COTA/L Acute Rehabilitation [phone]   Janice Coffin 10/17/2019, 8:55 AM

## 2019-10-18 MED ORDER — HEPARIN SODIUM (PORCINE) 5000 UNIT/ML IJ SOLN
5000.0000 [IU] | Freq: Three times a day (TID) | INTRAMUSCULAR | Status: DC
  Administered 2019-10-18 – 2019-10-20 (×6): 5000 [IU] via SUBCUTANEOUS
  Filled 2019-10-18 (×7): qty 1

## 2019-10-18 NOTE — Progress Notes (Signed)
   VASCULAR SURGERY ASSESSMENT & PLAN:   POD 4 -OPEN ABDOMINAL AORTIC ANEURYSM REPAIR: Patient is doing well.  He has been ambulating.  I will advance to a regular diet today and anticipate discharge tomorrow or Monday.  Dulcolax suppository today.  DC triple-lumen catheter.  VASCULAR QUALITY INITIATIVE: He is on aspirin and is on a statin.  DVT PROPHYLAXIS: He is on subcu heparin.   SUBJECTIVE:   No complaints.  Passing flatus but no BM.  PHYSICAL EXAM:   Vitals:   10/17/19 1840 10/17/19 2110 10/18/19 0030 10/18/19 0541  BP: 124/70 134/75 139/76 139/73  Pulse: 90 82 78 76  Resp: 20 (!) 22 20 14   Temp: 98.6 F (37 C) 98.2 F (36.8 C) 98.2 F (36.8 C) 98 F (36.7 C)  TempSrc: Oral Oral Oral Oral  SpO2: 93% 95% 96% 94%  Weight:      Height:       Abdomen has normal pitched bowel sounds. Palpable dorsalis pedis pulses bilaterally. His abdominal incision looks fine.  LABS:   No new labs.  PROBLEM LIST:    Active Problems:   Abdominal aortic aneurysm (AAA) (HCC)   Abdominal aortic aneurysm (AAA) greater than 5.5 cm in diameter in male [facility])   CURRENT MEDS:   . aspirin EC  81 mg Oral Daily  . Chlorhexidine Gluconate Cloth  6 each Topical Daily  . docusate sodium  100 mg Oral Daily  . metoprolol tartrate  5 mg Intravenous Q6H  . pantoprazole  40 mg Oral QHS  . potassium chloride  20 mEq Oral BID  . simvastatin  40 mg Oral QHS  . sodium chloride flush  10-40 mL Intracatheter Q12H    Deitra Mayo Office: [phone] 10/18/2019

## 2019-10-18 NOTE — Progress Notes (Signed)
Pt's CVC internal jugular removed today per MD order.  Pt tolerated well.  No complications to report at this time.  Pt will be on bed rest and monitored per protocol.

## 2019-10-18 NOTE — Progress Notes (Signed)
Shanon Brow RN aware of order to d/c CVC

## 2019-10-19 NOTE — Progress Notes (Signed)
   VASCULAR SURGERY ASSESSMENT & PLAN:   POD 5 -OPEN ABDOMINAL AORTIC ANEURYSM REPAIR: Patient is doing well.  He has been ambulating.  I have advanced to a regular diet.  VASCULAR QUALITY INITIATIVE: He is on aspirin and is on a statin.  DVT PROPHYLAXIS: He is on subcu heparin.  Anticipate discharge tomorrow.   SUBJECTIVE:   No complaints.  Tolerating full liquids.  He did move his bowels.  He has been ambulating.  PHYSICAL EXAM:   Vitals:   10/18/19 1930 10/18/19 2338 10/19/19 0319 10/19/19 0609  BP: 124/73 138/66 100/72 (!) 147/81  Pulse: (!) 55 76 78 67  Resp: 20 (!) 27  16  Temp:  97.6 F (36.4 C) 98 F (36.7 C)   TempSrc: Oral Oral Oral   SpO2: 98% 93% 93% 96%  Weight:      Height:       Lungs are clear. Abdomen normal pitched bowel sounds.  His incision looks fine. He has palpable dorsalis pedis pulses. The central line is out of his neck now.  LABS:    PROBLEM LIST:    Active Problems:   Abdominal aortic aneurysm (AAA) (HCC)   Abdominal aortic aneurysm (AAA) greater than 5.5 cm in diameter in male [facility])   CURRENT MEDS:   . aspirin EC  81 mg Oral Daily  . Chlorhexidine Gluconate Cloth  6 each Topical Daily  . docusate sodium  100 mg Oral Daily  . heparin injection (subcutaneous)  5,000 Units Subcutaneous Q8H  . metoprolol tartrate  5 mg Intravenous Q6H  . pantoprazole  40 mg Oral QHS  . simvastatin  40 mg Oral QHS  . sodium chloride flush  10-40 mL Intracatheter Q12H    Deitra Mayo Office: [phone] 10/19/2019

## 2019-10-20 MED ORDER — OXYCODONE HCL 5 MG PO TABS: 5.0000 mg | ORAL_TABLET | Freq: Four times a day (QID) | ORAL | 0 refills | Status: DC | PRN

## 2019-10-20 MED FILL — Heparin Sodium (Porcine) Inj 1000 Unit/ML: INTRAMUSCULAR | Qty: 30 | Status: AC

## 2019-10-20 MED FILL — Sodium Chloride Irrigation Soln 0.9%: Qty: 3000 | Status: AC

## 2019-10-20 NOTE — Progress Notes (Signed)
Physical Therapy Treatment Patient Details Name: [name] MRN: [id number] DOB: August 19, 1940 Today's Date: 10/20/2019    History of Present Illness Pt is a 79 yo male s/p AAA repair. PMHx: CAD, bladder tumor with recent (4/16) TURBT, HTN, polio with nonfunctional RUE, lt knee/femur ORIF    PT Comments    Pt doing well with mobility. Able to use rolling walker with LUE. Ready for dc home with wife.    Follow Up Recommendations  Home health PT;Supervision/Assistance - 24 hour     Equipment Recommendations  Rolling walker with 5" wheels;3in1 (PT)    Recommendations for Other Services       Precautions / Restrictions Precautions Precautions: Fall Restrictions Weight Bearing Restrictions: No    Mobility  Bed Mobility               General bed mobility comments: Pt up in chair  Transfers Overall transfer level: Needs assistance Equipment used: Rolling walker (2 wheeled) Transfers: Sit to/from Stand Sit to Stand: Min guard         General transfer comment: Assist to stabilize walker and pt can pull up without assist  Ambulation/Gait Ambulation/Gait assistance: Supervision;Min guard Gait Distance (Feet): 200 Feet Assistive device: Rolling walker (2 wheeled);Straight cane;None Gait Pattern/deviations: Step-through pattern;Decreased stride length Gait velocity: decr Gait velocity interpretation: 1.31 - 2.62 ft/sec, indicative of limited community ambulator General Gait Details: Can push walker with LUE and supervision. With cane on no device min guard   Stairs             Wheelchair Mobility    Modified Rankin (Stroke Patients Only)       Balance Overall balance assessment: Needs assistance Sitting-balance support: No upper extremity supported;Feet supported Sitting balance-Leahy Scale: Good     Standing balance support: No upper extremity supported Standing balance-Leahy Scale: Fair                              Cognition  Arousal/Alertness: Awake/alert Behavior During Therapy: Flat affect Overall Cognitive Status: Within Functional Limits for tasks assessed                                        Exercises      General Comments        Pertinent Vitals/Pain Pain Assessment: No/denies pain    Home Living                      Prior Function            PT Goals (current goals can now be found in the care plan section) Acute Rehab PT Goals Patient Stated Goal: be able to garden Progress towards PT goals: Progressing toward goals    Frequency    Min 3X/week      PT Plan Current plan remains appropriate    Co-evaluation              AM-PAC PT "6 Clicks" Mobility   Outcome Measure  Help needed turning from your back to your side while in a flat bed without using bedrails?: None Help needed moving from lying on your back to sitting on the side of a flat bed without using bedrails?: A Little Help needed moving to and from a bed to a chair (including a wheelchair)?: A Little Help needed standing up  from a chair using your arms (e.g., wheelchair or bedside chair)?: A Little Help needed to walk in hospital room?: A Little Help needed climbing 3-5 steps with a railing? : A Little 6 Click Score: 19    End of Session   Activity Tolerance: Patient tolerated treatment well Patient left: in chair;with call bell/phone within reach;with family/visitor present Nurse Communication: Mobility status PT Visit Diagnosis: Unsteadiness on feet (R26.81);Other abnormalities of gait and mobility (R26.89);Muscle weakness (generalized) (M62.81)     Time: [id number] PT Time Calculation (min) (ACUTE ONLY): 10 min  Charges:  $Gait Training: 8-22 mins                     [place] Pager [phone] Office [place] 10/20/2019, 10:26 AM

## 2019-10-20 NOTE — Discharge Instructions (Signed)
 [facility] [place]  Discharge Instructions   Open Aortic Surgery  Please refer to the following instructions for your post-procedure care. Your surgeon or Physician Assistant will discuss any changes with you.  Activity  Avoid lifting more than eight pounds (a gallon of milk) until after your first post-operative visit. You are encouraged to walk as much as you can. You can slowly return to normal activities but must avoid strenuous activity and heavy lifting until your doctor tells you it's OK. Heavy lifting can hurt the incision and cause a hernia. Avoid activities such as vacuuming or swinging a golf club. It is normal to feel tired for several weeks after your surgery. Do not drive until your doctor gives the OK and you are no longer taking prescription pain medications. It is also normal to have difficulty with sleep habits, eating and bowl movements after surgery. These will go away with time.  Bathing/Showering  You may shower after you go home. Do not soak in a bathtub, hot tub, or swim until the incision heals.  Incision Care  Shower every day. Clean your incision with mild soap and water. Pat the area dry with a clean towel. You do not need a bandage unless otherwise instructed. Do not apply any ointments or creams to your incision. You may have skin glue on your incision. Do not peel it off. It will come off on its own in about one week. If you have staples or sutures along your incision, they will be removed at your post op appointment.  Diet  Resume your normal diet. There are no special food restriction following this procedure. A low fat/low cholesterol diet is recommended for all patients with vascular disease. After your aortic surgery, it's normal to feel full faster than usual and to not feel as hungry as you normally would. You will probably lose weight initially following your surgery. It's best to eat small, frequent meals over the course of  the day. Call the office if you find that you are unable to eat even small meals. In order to heal from your surgery, it is CRITICAL to get adequate nutrition. Your body requires vitamins, minerals, and protein. Vegetables are the best source of vitamins and minerals. causing pain, you may take over-the-counter pain reliever such as acetaminophen (Tylenol). If you were prescribed a stronger pain medication, please be aware these medication can cause nausea and constipation. Prevent nausea by taking the medication with a snack or meal. Avoid constipation by drinking plenty of fluids and eating foods with a high amount of fiber, such as fruits, vegetables and grains. Take 100mg of the over-the-counter stool softener Colace twice a day as needed to help with constipation. A laxative, such as Milk of Magnesia, may be recommended for you at this time. Do not take a laxative unless your surgeon or P.A. tells you it's OK. Do not take Tylenol if you are taking stronger pain medications (such as Percocet).  Follow Up  Our office will schedule a follow up appointment 2-3 weeks after discharge.  Please call us immediately for any of the following conditions    .     Severe or worsening pain in your legs or feet or in your abdomen back or chest. Increased pain, redness drainage (pus) from your incision site. Increased abdominal pain, bloating, nausea, vomiting, or persistent diarrhea. Fever of 101 degrees or higher. Swelling in your leg (s).  Reduce your risk of vascular disease  Stop smoking.   If you would like help, call [facility] at 1-800-QUIT-NOW (1-800-784-8669) or [facility] at 336-586-4000. Manage your cholesterol Maintain a desired weight Control your diabetes Keep your blood pressure down  If you have any questions please call the office at [phone].   

## 2019-10-20 NOTE — TOC Transition Note (Signed)
Transition of Care [facility]) - CM/SW Discharge Note Marvetta Gibbons RN, BSN Transitions of Care Unit 4E- RN Case Manager [phone]   Patient Details  Name: [name] MRN: [id number] Date of Birth: 1941/03/10  Transition of Care [facility]) CM/SW Contact:  Dawayne Patricia, RN Phone Number: 10/20/2019, 2:27 PM   Clinical Narrative:    Pt stable for transition home, s/p AAA. Orders placed for HHPT/OT. And DME RW and 3n1. CM in to room to speak with pt and wife regarding transition needs- will have DME RW and 3n1 delivered to room prior to discharge- call made to [facility] with [employer] tor request DME. Discussed with pt and wife HH needs- list provided for choice Per CMS guidelines from medicare.gov website with star ratings (copy placed in shadow chart)- per pt and wife they do not have a preference after review and state that they will leave it up to CM to find an agency that can accept (they do not request a call with name of agency and will wait until agency calls to set up). Son waiting to transport home once DME delivered. Address, phone # and PCP all confirmed with pt in epic.  Call made to [facility] with Alvis Lemmings for [facility] needs- Alvis Lemmings is able to accept referral and will reach out to pt for start of care   Final next level of care: [place] Barriers to Discharge: No Barriers Identified   Patient Goals and CMS Choice Patient states their goals for this hospitalization and ongoing recovery are:: "get better" CMS Medicare.gov Compare Post Acute Care list provided to:: Patient Choice offered to / list presented to : Patient, Spouse  Discharge Placement                   Home with [facility]    Discharge Plan and Services   Discharge Planning Services: CM Consult Post Acute Care Choice: Home Health, Durable Medical Equipment          DME Arranged: 3-N-1, Walker rolling DME Agency: [employer] Date DME Agency Contacted: 10/20/19 Time DME Agency Contacted: 1000 Representative  spoke with at DME Agency: Thedore Mins HH Arranged: PT, OT [place] Agency: [place] Date [place]: 10/20/19 Time [place]: 1245 Representative spoke with at [place]: [place] ([place]) Interventions     Readmission Risk Interventions No flowsheet data found.

## 2019-10-20 NOTE — Progress Notes (Addendum)
[facility] [place]  Subjective  - Doing well and ready to go home.  Objective 127/81 76 97.9 F (36.6 C) (Oral) (!) 22 96%  Intake/Output Summary (Last 24 hours) at 10/20/2019 0719 Last data filed at 10/19/2019 1405 Gross per 24 hour  Intake 120 ml  Output 1175 ml  Net -1055 ml    Ambulating in the room with rolling walker Incision healing well, Abdomin soft DP pulses B LE palpable Lungs non labored breathing on RA  Assessment/Planning: S/P open AAA repair  Discharge today with HH PT and rolling walker F/U in 2-3 weeks with Dr. Everrett Coombe 10/20/2019 7:19 AM --  Laboratory Lab Results: No results for input(s): WBC, HGB, HCT, PLT in the last 72 hours. BMET No results for input(s): NA, K, CL, CO2, GLUCOSE, BUN, CREATININE, CALCIUM in the last 72 hours.  COAG Lab Results  Component Value Date   INR 1.2 10/14/2019   INR 1.0 10/09/2019   No results found for: PTT  I have interviewed the patient and examined the patient. I agree with the findings by the PA.  Agree with plans for discharge today.  Gae Gallop, MD [phone]

## 2019-10-20 NOTE — Care Management Important Message (Signed)
Important Message  Patient Details  Name: [name] MRN: [id number] Date of Birth: 15-Apr-1941   Medicare Important Message Given:  Yes     Shelda Altes 10/20/2019, 9:33 AM

## 2019-10-20 NOTE — Progress Notes (Signed)
Pt discharged from unit, VSS, wound education and discharge instruction given to pt and wife.   Phoebe Sharps, RN

## 2019-10-21 NOTE — Discharge Summary (Signed)
[facility] Discharge Summary   Patient ID:  [name] MRN: [id number] DOB/AGE: Sep 30, 1940 79 y.o.  Admit date: 10/14/2019 Discharge date:10/20/19 Date of Surgery: 10/14/2019 Surgeon: Surgeon(s): Angelia Mould, MD  Admission Diagnosis: Abdominal aortic aneurysm (AAA) [facility]) [I71.4] Abdominal aortic aneurysm (AAA) greater than 5.5 cm in diameter in male [facility]) [I71.4]  Discharge Diagnoses:  Abdominal aortic aneurysm (AAA) ([place]) [I71.4] Abdominal aortic aneurysm (AAA) greater than 5.5 cm in diameter in male [facility]) [I71.4]  Secondary Diagnoses: Past Medical History:  Diagnosis Date  . AAA (abdominal aortic aneurysm) ([place])   . Bladder cancer ([place])    s/p TURBT 09/26/19  . Blood transfusion without reported diagnosis   . Coronary artery disease    stents placed  . GERD (gastroesophageal reflux disease)   . HOH (hard of hearing)   . Hyperlipidemia   . Hypertension   . Iron deficiency anemia 05/14/2018  . Polio    hx of. right arm atrophy    Procedure(s): OPEN ABDOMINAL AORTIC ANEURYSM REPAIR USING HEMASHIELD GOLD 68mm x 30cm GRAFT  Discharged Condition: stable  HPI:  ABDOMINAL AORTIC ANEURYSM: This patient has a 6 cm infrarenal abdominal aortic aneurysm.  The neck however is very short and he is not a candidate for an endovascular repair.  Thus he would require open repair.  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   Hospital Course:  [name] is a 79 y.o. male is S/P Procedure(s): OPEN ABDOMINAL AORTIC ANEURYSM REPAIR USING HEMASHIELD GOLD 60mm x 30cm GRAFT Post op he maintained palpable pedal pulses and was admitted to [facility].  He maintained good urine output and was extubated.  NG tube was discontinued post op day 1.  He was advanced to clears on post op day 3.  Increased mobility and transferred to 4E.  Post op 5 he was ambulating independently, tolerating  full diet with good pain controled.  F/U with HH PT and rolling walker ordered.  He is on aspirin and is on a statin.   Significant Diagnostic Studies: CBC Lab Results  Component Value Date   WBC 11.4 (H) 10/17/2019   HGB 11.0 (L) 10/17/2019   HCT 37.1 (L) 10/17/2019   MCV 92.5 10/17/2019   PLT 254 10/17/2019    BMET    Component Value Date/Time   NA 139 10/17/2019 0600   K 3.2 (L) 10/17/2019 0600   CL 98 10/17/2019 0600   CO2 33 (H) 10/17/2019 0600   GLUCOSE 133 (H) 10/17/2019 0600   BUN 11 10/17/2019 0600   CREATININE 0.45 (L) 10/17/2019 0600   CREATININE 0.73 08/06/2019 1254   CALCIUM 8.6 (L) 10/17/2019 0600   GFRNONAA >60 10/17/2019 0600   GFRNONAA 89 08/06/2019 1254   GFRAA >60 10/17/2019 0600   GFRAA 103 08/06/2019 1254   COAG Lab Results  Component Value Date   INR 1.2 10/14/2019   INR 1.0 10/09/2019     Disposition:  Discharge to :Home Discharge Instructions    Call MD for:  redness, tenderness, or signs of infection (pain, swelling, bleeding, redness, odor or green/yellow discharge around incision site)   Complete by: As directed    Call MD for:  severe or increased pain, loss or decreased feeling  in affected limb(s)   Complete by: As directed    Call MD for:  temperature >100.5   Complete by: As directed    Resume previous diet  Complete by: As directed      Allergies as of 10/20/2019      Reactions   Tramadol Other (See Comments)   Excessive sleepiness/difficulty waking patient      Medication List    TAKE these medications   aspirin EC 81 MG tablet Take 81 mg by mouth daily.   clopidogrel 75 MG tablet Commonly known as: PLAVIX Take 75 mg by mouth daily.   ELDERBERRY PO Take 1 tablet by mouth daily. Notes to patient: Take as Prior   ferrous sulfate 325 (65 FE) MG tablet Take 1 tablet (325 mg total) by mouth 2 (two) times daily with a meal. What changed: when to take this   nitroGLYCERIN 0.4 MG SL tablet Commonly known as:  NITROSTAT Place 1 tablet (0.4 mg total) under the tongue every 5 (five) minutes as needed for chest pain. What changed: when to take this   oxyCODONE 5 MG immediate release tablet Commonly known as: Oxy IR/ROXICODONE Take 1 tablet (5 mg total) by mouth every 6 (six) hours as needed for moderate pain.   pantoprazole 40 MG tablet Commonly known as: PROTONIX Take 1 tablet (40 mg total) by mouth daily. Take 30 min before meals What changed: when to take this   simvastatin 40 MG tablet Commonly known as: ZOCOR Take 40 mg by mouth at bedtime.   tamsulosin 0.4 MG Caps capsule Commonly known as: FLOMAX Take 1 capsule (0.4 mg total) by mouth daily as needed. For slow urinary stream What changed:   when to take this  additional instructions   VITAMIN D3 PO Take 1 capsule by mouth daily. Notes to patient: Daily       Verbal and written Discharge instructions given to the patient. Wound care per Discharge AVS Follow-up Information    Angelia Mould, MD Follow up in 2 week(s).   Specialties: Vascular Surgery, Cardiology Contact information: [place] [address] [phone]        [facility] Follow up.   Specialty: Home Health Services Contact information: [place] [address] [phone]           Signed: Roxy Horseman 10/21/2019, 8:46 AM - For VQI Registry use --- Instructions: Press F2 to tab through selections.  Delete question if not applicable.   Post-op:  Time to Extubation: [x ] In OR, [ ]  < 12 hrs, [ ]  12-24 hrs, [ ]  >=24 hrs Vasopressors Req. Post-op: No ICU Stay: 3 days Transfusion: No  If yes, 0 units given MI: [x ] No, [ ]  Troponin only, [ ]  EKG or Clinical New Arrhythmia: No  Complications: CHF: No Resp failure: [x ] none, [ ]  Pneumonia, [ ]  Ventilator Chg in renal function: [x ] none, [ ]  Inc. Cr > 0.5, [ ]  Temp. Dialysis, [ ]  Permanent dialysis Leg ischemia: [x ] No, [ ]   Yes, no Surgery needed, [ ]  Yes, Surgery needed, [ ]  Amputation Bowel ischemia: [x ] No, [ ]  Medical Rx, [ ]  Surgical Rx Wound complication: [x ] No, [ ]  Superficial separation/infection, [ ]  Return to OR Return to OR: No  Return to OR for bleeding: No Stroke: [x ] None, [ ]  Minor, [ ]  Major  Discharge medications: Statin use:  Yes ASA use:  Yes Plavix use:  No  for medical reason   Beta blocker use: No  for medical reason

## 2019-11-12 ENCOUNTER — Encounter: Payer: Self-pay | Admitting: Vascular Surgery

## 2019-11-12 ENCOUNTER — Ambulatory Visit (INDEPENDENT_AMBULATORY_CARE_PROVIDER_SITE_OTHER): Payer: Self-pay | Admitting: Vascular Surgery

## 2019-11-12 ENCOUNTER — Other Ambulatory Visit: Payer: Self-pay

## 2019-11-12 VITALS — BP 146/87 | HR 65 | Temp 97.7°F | Resp 20 | Ht 66.0 in | Wt 191.0 lb

## 2019-11-12 DIAGNOSIS — Z48812 Encounter for surgical aftercare following surgery on the circulatory system: Secondary | ICD-10-CM

## 2019-11-12 DIAGNOSIS — I714 Abdominal aortic aneurysm, without rupture, unspecified: Secondary | ICD-10-CM

## 2019-11-12 NOTE — Progress Notes (Signed)
Patient name: [name] MRN: [id number] DOB: Jan 21, 1941 Sex: male  REASON FOR VISIT:   Follow-up after open repair of juxtarenal abdominal aortic aneurysm  HPI:   [name] is a pleasant 79 y.o. male good and incidental finding of a 6 cm juxtarenal abdominal aortic aneurysm that was found during a work-up for hematuria.  He had a bladder cancer removed and then presented for elective repair of his aneurysm.  On 10/14/2019 he underwent repair of a juxtarenal abdominal aortic aneurysm.  He did well postoperatively and comes in for routine follow-up visit.  Since I saw him last he has been doing well.  He is gradually resuming his normal activities.  His appetite is slowly returning.  He has no chest pain or shortness of breath.  Current Outpatient Medications  Medication Sig Dispense Refill  . aspirin EC 81 MG tablet Take 81 mg by mouth daily.    . Cholecalciferol (VITAMIN D3 PO) Take 1 capsule by mouth daily.     . clopidogrel (PLAVIX) 75 MG tablet Take 75 mg by mouth daily.    Marland Kitchen ELDERBERRY PO Take 1 tablet by mouth daily.     . ferrous sulfate 325 (65 FE) MG tablet Take 1 tablet (325 mg total) by mouth 2 (two) times daily with a meal. (Patient taking differently: Take 325 mg by mouth daily. )  3  . nitroGLYCERIN (NITROSTAT) 0.4 MG SL tablet Place 1 tablet (0.4 mg total) under the tongue every 5 (five) minutes as needed for chest pain. (Patient taking differently: Place 0.4 mg under the tongue every 5 (five) minutes x 3 doses as needed for chest pain. ) 25 tablet 3  . pantoprazole (PROTONIX) 40 MG tablet Take 1 tablet (40 mg total) by mouth daily. Take 30 min before meals (Patient taking differently: Take 40 mg by mouth in the morning and at bedtime. Take 30 min before meals)    . simvastatin (ZOCOR) 40 MG tablet Take 40 mg by mouth at bedtime.     . tamsulosin (FLOMAX) 0.4 MG CAPS capsule Take 1 capsule (0.4 mg total) by mouth daily as needed. For slow urinary stream (Patient  taking differently: Take 0.4 mg by mouth every evening. ) 30 capsule 11  . oxyCODONE (OXY IR/ROXICODONE) 5 MG immediate release tablet Take 1 tablet (5 mg total) by mouth every 6 (six) hours as needed for moderate pain. (Patient not taking: Reported on 11/12/2019) 20 tablet 0   No current facility-administered medications for this visit.    REVIEW OF SYSTEMS:  [X]  denotes positive finding, [ ]  denotes negative finding Vascular    Leg swelling    Cardiac    Chest pain or chest pressure:    Shortness of breath upon exertion:    Short of breath when lying flat:    Irregular heart rhythm:    Constitutional    Fever or chills:     PHYSICAL EXAM:   Vitals:   11/12/19 0840  BP: (!) 146/87  Pulse: 65  Resp: 20  Temp: 97.7 F (36.5 C)  SpO2: 95%  Weight: 191 lb (86.6 kg)  Height: 5\' 6"  (1.676 m)    GENERAL: The patient is a well-nourished male, in no acute distress. The vital signs are documented above. CARDIOVASCULAR: There is a regular rate and rhythm. PULMONARY: There is good air exchange bilaterally without wheezing or rales. VASCULAR: He has palpable femoral and dorsalis pedis pulses bilaterally. His abdominal incision is healing nicely.  DATA:  No new data  MEDICAL ISSUES:   STATUS POST OPEN REPAIR OF JUXTARENAL ANEURYSM: The patient is doing well status post open repair of his juxtarenal aneurysm.  He is gradually resuming his normal activities.  I explained to him that if he gets any invasive procedures like colonoscopy that might create a bacteremia that he should receive prophylactic antibiotics.  I will see him back in 6 months with ABIs.  After that I will follow him on a yearly basis.  His wife would prefer to have him followed routinely.  I think this is perfectly reasonable.  Deitra Mayo [facility] [place] [phone]

## 2019-11-14 ENCOUNTER — Other Ambulatory Visit: Payer: Self-pay | Admitting: *Deleted

## 2019-11-14 DIAGNOSIS — I739 Peripheral vascular disease, unspecified: Secondary | ICD-10-CM

## 2019-12-02 ENCOUNTER — Other Ambulatory Visit: Payer: Self-pay | Admitting: Cardiovascular Disease

## 2019-12-11 DIAGNOSIS — E782 Mixed hyperlipidemia: Secondary | ICD-10-CM | POA: Diagnosis not present

## 2019-12-11 DIAGNOSIS — I10 Essential (primary) hypertension: Secondary | ICD-10-CM | POA: Diagnosis not present

## 2019-12-11 DIAGNOSIS — B9681 Helicobacter pylori [H. pylori] as the cause of diseases classified elsewhere: Secondary | ICD-10-CM | POA: Diagnosis not present

## 2019-12-11 DIAGNOSIS — D649 Anemia, unspecified: Secondary | ICD-10-CM | POA: Diagnosis not present

## 2019-12-11 DIAGNOSIS — D509 Iron deficiency anemia, unspecified: Secondary | ICD-10-CM | POA: Diagnosis not present

## 2019-12-11 DIAGNOSIS — B372 Candidiasis of skin and nail: Secondary | ICD-10-CM | POA: Diagnosis not present

## 2019-12-11 DIAGNOSIS — J029 Acute pharyngitis, unspecified: Secondary | ICD-10-CM | POA: Diagnosis not present

## 2019-12-11 DIAGNOSIS — E1169 Type 2 diabetes mellitus with other specified complication: Secondary | ICD-10-CM | POA: Diagnosis not present

## 2019-12-11 DIAGNOSIS — D72829 Elevated white blood cell count, unspecified: Secondary | ICD-10-CM | POA: Diagnosis not present

## 2019-12-11 DIAGNOSIS — I2510 Atherosclerotic heart disease of native coronary artery without angina pectoris: Secondary | ICD-10-CM | POA: Diagnosis not present

## 2019-12-17 DIAGNOSIS — I10 Essential (primary) hypertension: Secondary | ICD-10-CM | POA: Diagnosis not present

## 2019-12-17 DIAGNOSIS — E1169 Type 2 diabetes mellitus with other specified complication: Secondary | ICD-10-CM | POA: Diagnosis not present

## 2019-12-17 DIAGNOSIS — B372 Candidiasis of skin and nail: Secondary | ICD-10-CM | POA: Diagnosis not present

## 2019-12-17 DIAGNOSIS — D72829 Elevated white blood cell count, unspecified: Secondary | ICD-10-CM | POA: Diagnosis not present

## 2019-12-17 DIAGNOSIS — I714 Abdominal aortic aneurysm, without rupture: Secondary | ICD-10-CM | POA: Diagnosis not present

## 2019-12-17 DIAGNOSIS — R5383 Other fatigue: Secondary | ICD-10-CM | POA: Diagnosis not present

## 2019-12-17 DIAGNOSIS — D509 Iron deficiency anemia, unspecified: Secondary | ICD-10-CM | POA: Diagnosis not present

## 2019-12-17 DIAGNOSIS — K219 Gastro-esophageal reflux disease without esophagitis: Secondary | ICD-10-CM | POA: Diagnosis not present

## 2019-12-17 DIAGNOSIS — E782 Mixed hyperlipidemia: Secondary | ICD-10-CM | POA: Diagnosis not present

## 2020-01-05 DIAGNOSIS — C678 Malignant neoplasm of overlapping sites of bladder: Secondary | ICD-10-CM | POA: Diagnosis not present

## 2020-02-19 ENCOUNTER — Ambulatory Visit: Payer: Medicare HMO | Attending: Internal Medicine

## 2020-02-19 ENCOUNTER — Ambulatory Visit: Payer: Self-pay

## 2020-02-19 DIAGNOSIS — Z23 Encounter for immunization: Secondary | ICD-10-CM

## 2020-02-19 NOTE — Progress Notes (Signed)
   Covid-19 Vaccination Clinic  Name:  [name]    MRN: [id number] DOB: 08/12/1940  02/19/2020  [name] was observed post Covid-19 immunization for 15 minutes without incident. He was provided with Vaccine Information Sheet and instruction to access the V-Safe system.   [name] was instructed to call 911 with any severe reactions post vaccine: Marland Kitchen Difficulty breathing  . Swelling of face and throat  . A fast heartbeat  . A bad rash all over body  . Dizziness and weakness

## 2020-03-15 DIAGNOSIS — L218 Other seborrheic dermatitis: Secondary | ICD-10-CM | POA: Diagnosis not present

## 2020-03-17 DIAGNOSIS — I2510 Atherosclerotic heart disease of native coronary artery without angina pectoris: Secondary | ICD-10-CM | POA: Diagnosis not present

## 2020-03-17 DIAGNOSIS — D649 Anemia, unspecified: Secondary | ICD-10-CM | POA: Diagnosis not present

## 2020-03-17 DIAGNOSIS — J309 Allergic rhinitis, unspecified: Secondary | ICD-10-CM | POA: Diagnosis not present

## 2020-03-17 DIAGNOSIS — K219 Gastro-esophageal reflux disease without esophagitis: Secondary | ICD-10-CM | POA: Diagnosis not present

## 2020-03-17 DIAGNOSIS — I10 Essential (primary) hypertension: Secondary | ICD-10-CM | POA: Diagnosis not present

## 2020-03-17 DIAGNOSIS — Z0000 Encounter for general adult medical examination without abnormal findings: Secondary | ICD-10-CM | POA: Diagnosis not present

## 2020-03-17 DIAGNOSIS — E782 Mixed hyperlipidemia: Secondary | ICD-10-CM | POA: Diagnosis not present

## 2020-03-17 DIAGNOSIS — R110 Nausea: Secondary | ICD-10-CM | POA: Diagnosis not present

## 2020-03-17 DIAGNOSIS — D509 Iron deficiency anemia, unspecified: Secondary | ICD-10-CM | POA: Diagnosis not present

## 2020-03-17 DIAGNOSIS — R7301 Impaired fasting glucose: Secondary | ICD-10-CM | POA: Diagnosis not present

## 2020-03-23 DIAGNOSIS — E1169 Type 2 diabetes mellitus with other specified complication: Secondary | ICD-10-CM | POA: Diagnosis not present

## 2020-03-23 DIAGNOSIS — B372 Candidiasis of skin and nail: Secondary | ICD-10-CM | POA: Diagnosis not present

## 2020-03-23 DIAGNOSIS — D509 Iron deficiency anemia, unspecified: Secondary | ICD-10-CM | POA: Diagnosis not present

## 2020-03-23 DIAGNOSIS — D72829 Elevated white blood cell count, unspecified: Secondary | ICD-10-CM | POA: Diagnosis not present

## 2020-03-23 DIAGNOSIS — I10 Essential (primary) hypertension: Secondary | ICD-10-CM | POA: Diagnosis not present

## 2020-03-23 DIAGNOSIS — Z23 Encounter for immunization: Secondary | ICD-10-CM | POA: Diagnosis not present

## 2020-03-23 DIAGNOSIS — R5383 Other fatigue: Secondary | ICD-10-CM | POA: Diagnosis not present

## 2020-03-23 DIAGNOSIS — K219 Gastro-esophageal reflux disease without esophagitis: Secondary | ICD-10-CM | POA: Diagnosis not present

## 2020-03-23 DIAGNOSIS — E782 Mixed hyperlipidemia: Secondary | ICD-10-CM | POA: Diagnosis not present

## 2020-03-23 DIAGNOSIS — I714 Abdominal aortic aneurysm, without rupture: Secondary | ICD-10-CM | POA: Diagnosis not present

## 2020-03-23 DIAGNOSIS — Z0001 Encounter for general adult medical examination with abnormal findings: Secondary | ICD-10-CM | POA: Diagnosis not present

## 2020-03-24 ENCOUNTER — Telehealth: Payer: Self-pay

## 2020-03-24 DIAGNOSIS — E785 Hyperlipidemia, unspecified: Secondary | ICD-10-CM

## 2020-03-24 MED ORDER — ATORVASTATIN CALCIUM 40 MG PO TABS
40.0000 mg | ORAL_TABLET | Freq: Every day | ORAL | 3 refills | Status: DC
Start: 2020-03-24 — End: 2021-04-25

## 2020-03-24 NOTE — Telephone Encounter (Signed)
The patient has been notified of the result and verbalized understanding.  All questions (if any) were answered. Michaelyn Barter, RN 03/24/2020 5:17 PM   [facility] lab work. Will send in lipitor 40 mg to patient's local pharmacy.

## 2020-03-24 NOTE — Telephone Encounter (Signed)
-----   Message from Josue Hector, MD sent at 03/23/2020  4:57 PM EDT ----- LDL 91 too high for CAD/AAA would change to lipitor 40 mg daily and repeat labs in 3 months otherwise labs ok

## 2020-04-06 DIAGNOSIS — C678 Malignant neoplasm of overlapping sites of bladder: Secondary | ICD-10-CM | POA: Diagnosis not present

## 2020-04-27 NOTE — Progress Notes (Signed)
Patient ID: [name], male   DOB: 1940/08/14, 79 y.o.   MRN: [id number]   79 y.o. CAD stent LAD patent by cath 2009  Activity limited by polio affecting legs and calf pain. No significant PVD by ABI's HTN and HLD well Rx. No angina or chest pain   Staying busy doing remodeling work   Labs with DR Nevada Crane Reviewed LDL 82 and normal LFTs  Chronically elevated WBC Seen by hematology and no evidence of lymphoma or leukemia  Had EGD and colonoscopy 05/13/18 for heme positive stool and iron deficiency anemia Noted external Hemorrhoids and one small polyp removed Also had an area of gastric angiodysplasia Rx with clip/cautery  Myovue normal no ischemia 07/02/19 Echo with mild / moderate AS 06/25/19   No cardiac symptoms   ROS: Denies fever, malais, weight loss, blurry vision, decreased visual acuity, cough, sputum, SOB, hemoptysis, pleuritic pain, palpitaitons, heartburn, abdominal pain, melena, lower extremity edema, claudication, or rash.  All other systems reviewed and negative  General: BP 126/66   Pulse 82   Ht 5\' 8"  (1.727 m)   Wt 87.5 kg   SpO2 94%   BMI 29.35 kg/m   Affect appropriate Healthy:  appears stated age 45: normal Neck supple with no adenopathy JVP normal no bruits no thyromegaly Lungs clear with no wheezing and good diaphragmatic motion Heart:  S1/S2 AS murmur, no rub, gallop or click PMI normal Abdomen: benighn, BS positve, no tenderness, no AAA no bruit.  No HSM or HJR Distal pulses intact with no bruits No edema Neuro non-focal Skin warm and dry Polio with atrophied right shoulder and arm  Post left TKR    Current Outpatient Medications  Medication Sig Dispense Refill  . aspirin EC 81 MG tablet Take 81 mg by mouth daily.    Marland Kitchen atorvastatin (LIPITOR) 40 MG tablet Take 1 tablet (40 mg total) by mouth daily. 90 tablet 3  . Cholecalciferol (VITAMIN D3 PO) Take 1 capsule by mouth daily.     . clopidogrel (PLAVIX) 75 MG tablet Take 1 tablet by mouth  once daily 90 tablet 2  . ELDERBERRY PO Take 1 tablet by mouth daily.     . ferrous sulfate 325 (65 FE) MG tablet Take 1 tablet (325 mg total) by mouth 2 (two) times daily with a meal. (Patient taking differently: Take 325 mg by mouth daily. )  3  . nitroGLYCERIN (NITROSTAT) 0.4 MG SL tablet Place 1 tablet (0.4 mg total) under the tongue every 5 (five) minutes as needed for chest pain. (Patient taking differently: Place 0.4 mg under the tongue every 5 (five) minutes x 3 doses as needed for chest pain. ) 25 tablet 3  . pantoprazole (PROTONIX) 40 MG tablet Take 1 tablet (40 mg total) by mouth daily. Take 30 min before meals     No current facility-administered medications for this visit.    Allergies  Tramadol  Electrocardiogram:     12/27/17 SR rate 68 RBBB RAD no changes   Assessment and Plan  CAD: Distant LAD stent Patent cath 2009 Normal myovue 2010  Most recent myovue 07/02/19 normal EF 67%   HTN: Well controlled.  Continue current medications and low sodium Dash type diet.    Chol:   LDL 92 most recent labs no on lipitor 40 mg daily    Murmur: Echo reviewed 06/25/19 Mild to moderate AS mean gradient 17 mmHg  Peak 33 mmHg, DVI 0.44 and AVA 1.4 cm2  F/U echo January 2022  Edema:  Dependant -Low dose diuretic HCTZ 12.5 mg  PRN Low sodium diet .   Anemia:  See notes regarding Endo/Colon on 05/31/18  Continue iron and prilosec   F/U in 6 months  Echo January 2022 for AS  [name]

## 2020-05-04 ENCOUNTER — Ambulatory Visit: Payer: Medicare HMO | Admitting: Cardiovascular Disease

## 2020-05-04 ENCOUNTER — Other Ambulatory Visit: Payer: Self-pay

## 2020-05-04 ENCOUNTER — Encounter: Payer: Self-pay | Admitting: Cardiovascular Disease

## 2020-05-04 VITALS — BP 126/66 | HR 82 | Ht 68.0 in | Wt 193.0 lb

## 2020-05-04 DIAGNOSIS — I350 Nonrheumatic aortic (valve) stenosis: Secondary | ICD-10-CM | POA: Diagnosis not present

## 2020-05-04 MED ORDER — NITROGLYCERIN 0.4 MG SL SUBL: 0.4000 mg | SUBLINGUAL_TABLET | SUBLINGUAL | 3 refills | Status: DC | PRN

## 2020-05-04 NOTE — Patient Instructions (Signed)
Medication Instructions:  Your physician recommends that you continue on your current medications as directed. Please refer to the Current Medication list given to you today.  *If you need a refill on your cardiac medications before your next appointment, please call your pharmacy*   Lab Work: None today If you have labs (blood work) drawn today and your tests are completely normal, you will receive your results only by: . MyChart Message (if you have MyChart) OR . A paper copy in the mail If you have any lab test that is abnormal or we need to change your treatment, we will call you to review the results.   Testing/Procedures: Your physician has requested that you have an echocardiogram. Echocardiography is a painless test that uses sound waves to create images of your heart. It provides your doctor with information about the size and shape of your heart and how well your heart's chambers and valves are working. This procedure takes approximately one hour. There are no restrictions for this procedure.     Follow-Up: At [facility], you and your health needs are our priority.  As part of our continuing mission to provide you with exceptional heart care, we have created designated Provider Care Teams.  These Care Teams include your primary Cardiologist (physician) and Advanced Practice Providers (APPs -  Physician Assistants and Nurse Practitioners) who all work together to provide you with the care you need, when you need it.  We recommend signing up for the patient portal called "MyChart".  Sign up information is provided on this After Visit Summary.  MyChart is used to connect with patients for Virtual Visits (Telemedicine).  Patients are able to view lab/test results, encounter notes, upcoming appointments, etc.  Non-urgent messages can be sent to your provider as well.   To learn more about what you can do with MyChart, go to [email or web address].    Your next appointment:   6  month(s)  The format for your next appointment:   In Person  Provider:   Peter Nishan, MD   Other Instructions None     Thank you for choosing [facility] !         

## 2020-05-14 ENCOUNTER — Ambulatory Visit (HOSPITAL_COMMUNITY)
Admission: RE | Admit: 2020-05-14 | Discharge: 2020-05-14 | Disposition: A | Discharge status: Home / Self Care | Payer: Medicare HMO | Source: Ambulatory Visit | Attending: Cardiovascular Disease | Admitting: Cardiovascular Disease

## 2020-05-14 ENCOUNTER — Other Ambulatory Visit: Payer: Self-pay

## 2020-05-14 DIAGNOSIS — I350 Nonrheumatic aortic (valve) stenosis: Secondary | ICD-10-CM | POA: Diagnosis not present

## 2020-05-14 LAB — ECHOCARDIOGRAM COMPLETE
AR max vel: 1.69 cm2
AV Area VTI: 1.77 cm2
AV Area mean vel: 1.61 cm2
AV Mean grad: 18 mmHg
AV Peak grad: 34.1 mmHg
Ao pk vel: 2.92 m/s
Area-P 1/2: 2.33 cm2
S' Lateral: 2.6 cm

## 2020-05-14 NOTE — Progress Notes (Signed)
*  PRELIMINARY RESULTS* Echocardiogram 2D Echocardiogram has been performed.  [name] 05/14/2020, 10:28 AM

## 2020-06-02 ENCOUNTER — Encounter (HOSPITAL_COMMUNITY): Payer: Medicare HMO

## 2020-06-02 ENCOUNTER — Ambulatory Visit: Payer: Medicare HMO | Admitting: Vascular Surgery

## 2020-07-05 DIAGNOSIS — N3000 Acute cystitis without hematuria: Secondary | ICD-10-CM | POA: Diagnosis not present

## 2020-07-05 DIAGNOSIS — C678 Malignant neoplasm of overlapping sites of bladder: Secondary | ICD-10-CM | POA: Diagnosis not present

## 2020-07-06 DIAGNOSIS — Z0000 Encounter for general adult medical examination without abnormal findings: Secondary | ICD-10-CM | POA: Diagnosis not present

## 2020-07-06 DIAGNOSIS — R310 Gross hematuria: Secondary | ICD-10-CM | POA: Diagnosis not present

## 2020-07-06 DIAGNOSIS — D509 Iron deficiency anemia, unspecified: Secondary | ICD-10-CM | POA: Diagnosis not present

## 2020-07-06 DIAGNOSIS — D649 Anemia, unspecified: Secondary | ICD-10-CM | POA: Diagnosis not present

## 2020-07-06 DIAGNOSIS — I10 Essential (primary) hypertension: Secondary | ICD-10-CM | POA: Diagnosis not present

## 2020-07-06 DIAGNOSIS — K219 Gastro-esophageal reflux disease without esophagitis: Secondary | ICD-10-CM | POA: Diagnosis not present

## 2020-07-06 DIAGNOSIS — R110 Nausea: Secondary | ICD-10-CM | POA: Diagnosis not present

## 2020-07-06 DIAGNOSIS — I2510 Atherosclerotic heart disease of native coronary artery without angina pectoris: Secondary | ICD-10-CM | POA: Diagnosis not present

## 2020-07-06 DIAGNOSIS — R7301 Impaired fasting glucose: Secondary | ICD-10-CM | POA: Diagnosis not present

## 2020-07-06 DIAGNOSIS — J309 Allergic rhinitis, unspecified: Secondary | ICD-10-CM | POA: Diagnosis not present

## 2020-07-06 DIAGNOSIS — E782 Mixed hyperlipidemia: Secondary | ICD-10-CM | POA: Diagnosis not present

## 2020-07-07 ENCOUNTER — Ambulatory Visit (HOSPITAL_COMMUNITY)
Admission: RE | Admit: 2020-07-07 | Discharge: 2020-07-07 | Disposition: A | Discharge status: Home / Self Care | Payer: Medicare HMO | Source: Ambulatory Visit | Attending: Vascular Surgery | Admitting: Vascular Surgery

## 2020-07-07 ENCOUNTER — Ambulatory Visit: Payer: Medicare HMO | Admitting: Vascular Surgery

## 2020-07-07 ENCOUNTER — Other Ambulatory Visit: Payer: Self-pay

## 2020-07-07 ENCOUNTER — Encounter: Payer: Self-pay | Admitting: Vascular Surgery

## 2020-07-07 VITALS — BP 139/71 | HR 95 | Temp 97.9°F | Ht 68.0 in | Wt 190.0 lb

## 2020-07-07 DIAGNOSIS — I714 Abdominal aortic aneurysm, without rupture, unspecified: Secondary | ICD-10-CM

## 2020-07-07 DIAGNOSIS — I739 Peripheral vascular disease, unspecified: Secondary | ICD-10-CM | POA: Diagnosis not present

## 2020-07-07 NOTE — Progress Notes (Signed)
REASON FOR VISIT:   Follow-up after repair of juxtarenal aneurysm  MEDICAL ISSUES:   S/P REPAIR OF JUXTARENAL ABDOMINAL AORTIC ANEURYSM: The patient is doing well status post repair of his juxtarenal aneurysm.  He has no significant abdominal pain or back pain.  He has resumed his normal activities.  His appetite is good.  He has no claudication or lower extremity symptoms.  His wife who would like [place] to follow him on a yearly basis I plan on seeing him back in 1 year and have ordered ABIs at that time.  He knows to call sooner if he has problems.   HPI:   [name] is a pleasant 80 y.o. male who is being worked up for hematuria and an incidental finding was a 6 cm juxtarenal abdominal aortic aneurysm.  He had his bladder cancer addressed and then presented for elective repair of his aneurysm.  On 10/14/2019 he underwent repair of a juxtarenal abdominal aortic aneurysm.  When I saw him last on 11/12/2019 he was doing well with no specific complaints.  I set him up for a 60-month follow-up visit and ABIs.  Since I saw him last, he denies any history of claudication, rest pain, or nonhealing ulcers.  He said no significant abdominal pain or back pain.  He has resumed his normal activities.  His appetite is normal.  His bowels are functioning normally.  Past Medical History:  Diagnosis Date  . AAA (abdominal aortic aneurysm) ([place])   . Bladder cancer ([place])    s/p TURBT 09/26/19  . Blood transfusion without reported diagnosis   . Coronary artery disease    stents placed  . GERD (gastroesophageal reflux disease)   . HOH (hard of hearing)   . Hyperlipidemia   . Hypertension   . Iron deficiency anemia 05/14/2018  . Polio    hx of. right arm atrophy    Family History  Problem Relation Age of Onset  . Heart attack Brother 65       died  . Hypertension Mother   . Heart attack Father     SOCIAL HISTORY: Social History   Tobacco Use  . Smoking status: Former Smoker    Packs/day:  1.00    Years: 15.00    Pack years: 15.00    Types: Cigarettes    Quit date: 06/12/2004    Years since quitting: 16.0  . Smokeless tobacco: Never Used  Substance Use Topics  . Alcohol use: No    Alcohol/week: 0.0 standard drinks    Allergies  Allergen Reactions  . Tramadol Other (See Comments)    Excessive sleepiness/difficulty waking patient    Current Outpatient Medications  Medication Sig Dispense Refill  . aspirin EC 81 MG tablet Take 81 mg by mouth daily.    Marland Kitchen atorvastatin (LIPITOR) 40 MG tablet Take 1 tablet (40 mg total) by mouth daily. 90 tablet 3  . Cholecalciferol (VITAMIN D3 PO) Take 1 capsule by mouth daily.     . clopidogrel (PLAVIX) 75 MG tablet Take 1 tablet by mouth once daily 90 tablet 2  . ELDERBERRY PO Take 1 tablet by mouth daily.     . ferrous sulfate 325 (65 FE) MG tablet Take 1 tablet (325 mg total) by mouth 2 (two) times daily with a meal. (Patient taking differently: Take 325 mg by mouth daily.)  3  . nitroGLYCERIN (NITROSTAT) 0.4 MG SL tablet Place 1 tablet (0.4 mg total) under the tongue every 5 (five) minutes  x 3 doses as needed for chest pain. 25 tablet 3  . pantoprazole (PROTONIX) 40 MG tablet Take 1 tablet (40 mg total) by mouth daily. Take 30 min before meals     No current facility-administered medications for this visit.    REVIEW OF SYSTEMS:  [X]  denotes positive finding, [ ]  denotes negative finding Cardiac  Comments:  Chest pain or chest pressure:    Shortness of breath upon exertion:    Short of breath when lying flat:    Irregular heart rhythm:        Vascular    Pain in calf, thigh, or hip brought on by ambulation:    Pain in feet at night that wakes you up from your sleep:     Blood clot in your veins:    Leg swelling:         Pulmonary    Oxygen at home:    Productive cough:     Wheezing:         Neurologic    Sudden weakness in arms or legs:     Sudden numbness in arms or legs:     Sudden onset of difficulty speaking or  slurred speech:    Temporary loss of vision in one eye:     Problems with dizziness:         Gastrointestinal    Blood in stool:     Vomited blood:         Genitourinary    Burning when urinating:     Blood in urine:        Psychiatric    Major depression:         Hematologic    Bleeding problems:    Problems with blood clotting too easily:        Skin    Rashes or ulcers:        Constitutional    Fever or chills:     PHYSICAL EXAM:   Vitals:   07/07/20 1444  BP: 139/71  Pulse: 95  Temp: 97.9 F (36.6 C)  TempSrc: Skin  SpO2: 98%  Weight: 190 lb (86.2 kg)  Height: 5\' 8"  (1.727 m)    GENERAL: The patient is a well-nourished male, in no acute distress. The vital signs are documented above. CARDIAC: There is a regular rate and rhythm.  VASCULAR: I do not detect carotid bruits. He has palpable femoral pulses bilaterally. He has a palpable right posterior tibial pulse and a left dorsalis pedis pulse. He has no's lower extremity swelling. PULMONARY: There is good air exchange bilaterally without wheezing or rales. ABDOMEN: Soft and non-tender with normal pitched bowel sounds.  MUSCULOSKELETAL: There are no major deformities or cyanosis. SKIN: There are no ulcers or rashes noted. PSYCHIATRIC: The patient has a normal affect.  DATA:    ARTERIAL DOPPLER STUDY: I have independently interpreted his arterial Doppler study today.  On the right side he has a triphasic posterior tibial signal with a biphasic dorsalis pedis signal.  ABIs 100%.  Toe pressures 89 mmHg.  On the left side he has a triphasic posterior tibial signal with a biphasic dorsalis pedis signal.  ABIs 100%.    Deitra Mayo [facility] [phone]

## 2020-07-12 DIAGNOSIS — B372 Candidiasis of skin and nail: Secondary | ICD-10-CM | POA: Diagnosis not present

## 2020-07-12 DIAGNOSIS — K219 Gastro-esophageal reflux disease without esophagitis: Secondary | ICD-10-CM | POA: Diagnosis not present

## 2020-07-12 DIAGNOSIS — E1169 Type 2 diabetes mellitus with other specified complication: Secondary | ICD-10-CM | POA: Diagnosis not present

## 2020-07-12 DIAGNOSIS — E782 Mixed hyperlipidemia: Secondary | ICD-10-CM | POA: Diagnosis not present

## 2020-07-12 DIAGNOSIS — R5383 Other fatigue: Secondary | ICD-10-CM | POA: Diagnosis not present

## 2020-07-12 DIAGNOSIS — D72829 Elevated white blood cell count, unspecified: Secondary | ICD-10-CM | POA: Diagnosis not present

## 2020-07-12 DIAGNOSIS — C678 Malignant neoplasm of overlapping sites of bladder: Secondary | ICD-10-CM | POA: Diagnosis not present

## 2020-07-12 DIAGNOSIS — D509 Iron deficiency anemia, unspecified: Secondary | ICD-10-CM | POA: Diagnosis not present

## 2020-07-12 DIAGNOSIS — I714 Abdominal aortic aneurysm, without rupture: Secondary | ICD-10-CM | POA: Diagnosis not present

## 2020-07-12 DIAGNOSIS — I10 Essential (primary) hypertension: Secondary | ICD-10-CM | POA: Diagnosis not present

## 2020-07-28 DIAGNOSIS — L218 Other seborrheic dermatitis: Secondary | ICD-10-CM | POA: Diagnosis not present

## 2020-07-28 DIAGNOSIS — L820 Inflamed seborrheic keratosis: Secondary | ICD-10-CM | POA: Diagnosis not present

## 2020-08-04 DIAGNOSIS — K219 Gastro-esophageal reflux disease without esophagitis: Secondary | ICD-10-CM | POA: Diagnosis not present

## 2020-08-04 DIAGNOSIS — Z6829 Body mass index (BMI) 29.0-29.9, adult: Secondary | ICD-10-CM | POA: Diagnosis not present

## 2020-08-04 DIAGNOSIS — R030 Elevated blood-pressure reading, without diagnosis of hypertension: Secondary | ICD-10-CM | POA: Diagnosis not present

## 2020-08-04 DIAGNOSIS — G8323 Monoplegia of upper limb affecting right nondominant side: Secondary | ICD-10-CM | POA: Diagnosis not present

## 2020-08-04 DIAGNOSIS — E785 Hyperlipidemia, unspecified: Secondary | ICD-10-CM | POA: Diagnosis not present

## 2020-08-04 DIAGNOSIS — I2510 Atherosclerotic heart disease of native coronary artery without angina pectoris: Secondary | ICD-10-CM | POA: Diagnosis not present

## 2020-08-04 DIAGNOSIS — M8960 Osteopathy after poliomyelitis, unspecified site: Secondary | ICD-10-CM | POA: Diagnosis not present

## 2020-08-04 DIAGNOSIS — J302 Other seasonal allergic rhinitis: Secondary | ICD-10-CM | POA: Diagnosis not present

## 2020-08-04 DIAGNOSIS — B91 Sequelae of poliomyelitis: Secondary | ICD-10-CM | POA: Diagnosis not present

## 2020-08-04 DIAGNOSIS — E663 Overweight: Secondary | ICD-10-CM | POA: Diagnosis not present

## 2020-08-23 DIAGNOSIS — D649 Anemia, unspecified: Secondary | ICD-10-CM | POA: Diagnosis not present

## 2020-08-23 DIAGNOSIS — R110 Nausea: Secondary | ICD-10-CM | POA: Diagnosis not present

## 2020-08-23 DIAGNOSIS — R7301 Impaired fasting glucose: Secondary | ICD-10-CM | POA: Diagnosis not present

## 2020-08-23 DIAGNOSIS — Z0000 Encounter for general adult medical examination without abnormal findings: Secondary | ICD-10-CM | POA: Diagnosis not present

## 2020-08-23 DIAGNOSIS — I2510 Atherosclerotic heart disease of native coronary artery without angina pectoris: Secondary | ICD-10-CM | POA: Diagnosis not present

## 2020-08-23 DIAGNOSIS — K219 Gastro-esophageal reflux disease without esophagitis: Secondary | ICD-10-CM | POA: Diagnosis not present

## 2020-08-23 DIAGNOSIS — I10 Essential (primary) hypertension: Secondary | ICD-10-CM | POA: Diagnosis not present

## 2020-08-23 DIAGNOSIS — E782 Mixed hyperlipidemia: Secondary | ICD-10-CM | POA: Diagnosis not present

## 2020-08-23 DIAGNOSIS — J309 Allergic rhinitis, unspecified: Secondary | ICD-10-CM | POA: Diagnosis not present

## 2020-08-23 DIAGNOSIS — D509 Iron deficiency anemia, unspecified: Secondary | ICD-10-CM | POA: Diagnosis not present

## 2020-08-25 DIAGNOSIS — L820 Inflamed seborrheic keratosis: Secondary | ICD-10-CM | POA: Diagnosis not present

## 2020-08-31 ENCOUNTER — Other Ambulatory Visit: Payer: Self-pay | Admitting: Physician Assistant

## 2020-09-16 DIAGNOSIS — E1169 Type 2 diabetes mellitus with other specified complication: Secondary | ICD-10-CM | POA: Diagnosis not present

## 2020-09-16 DIAGNOSIS — K219 Gastro-esophageal reflux disease without esophagitis: Secondary | ICD-10-CM | POA: Diagnosis not present

## 2020-09-16 DIAGNOSIS — E782 Mixed hyperlipidemia: Secondary | ICD-10-CM | POA: Diagnosis not present

## 2020-09-16 DIAGNOSIS — I714 Abdominal aortic aneurysm, without rupture: Secondary | ICD-10-CM | POA: Diagnosis not present

## 2020-09-16 DIAGNOSIS — D509 Iron deficiency anemia, unspecified: Secondary | ICD-10-CM | POA: Diagnosis not present

## 2020-09-16 DIAGNOSIS — R5383 Other fatigue: Secondary | ICD-10-CM | POA: Diagnosis not present

## 2020-09-16 DIAGNOSIS — B372 Candidiasis of skin and nail: Secondary | ICD-10-CM | POA: Diagnosis not present

## 2020-09-16 DIAGNOSIS — D72829 Elevated white blood cell count, unspecified: Secondary | ICD-10-CM | POA: Diagnosis not present

## 2020-09-16 DIAGNOSIS — I10 Essential (primary) hypertension: Secondary | ICD-10-CM | POA: Diagnosis not present

## 2020-09-16 DIAGNOSIS — C678 Malignant neoplasm of overlapping sites of bladder: Secondary | ICD-10-CM | POA: Diagnosis not present

## 2020-09-20 IMAGING — CT CT RENAL STONE PROTOCOL
2 of 4 series · 16 of 46 positions shown, 18 images · non-contrast
Comparison: None.

CLINICAL DATA: Progressive hematuria.

EXAM:
CT ABDOMEN AND PELVIS WITHOUT CONTRAST
TECHNIQUE: Multidetector CT imaging of the abdomen and pelvis was performed
following the standard protocol without IV contrast.

[Series 2: axial st · axial · 0.84mm/px · z∈[+870,+1290]mm · 13 of 98 slices shown, 15 images]
[im 7/98  soft-tissue]
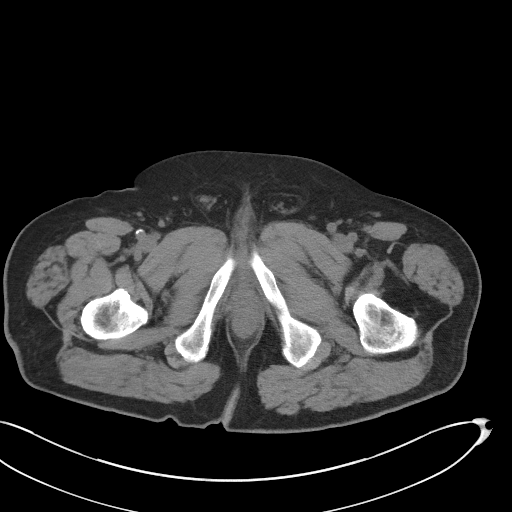
[im 7/98  bone]
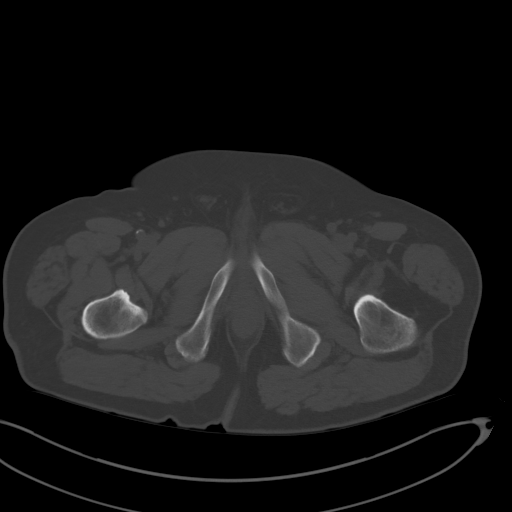
[im 14/98  soft-tissue]
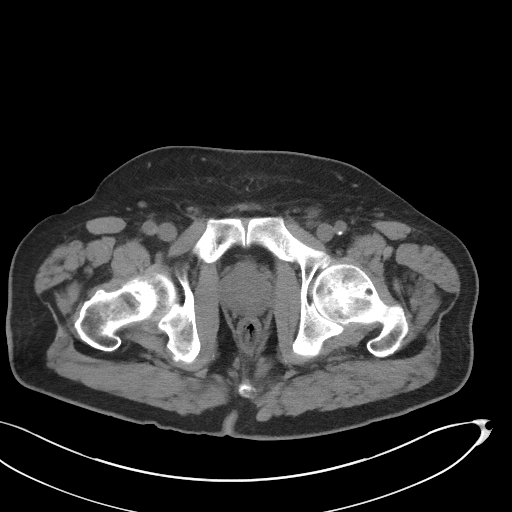
[im 21/98  soft-tissue]
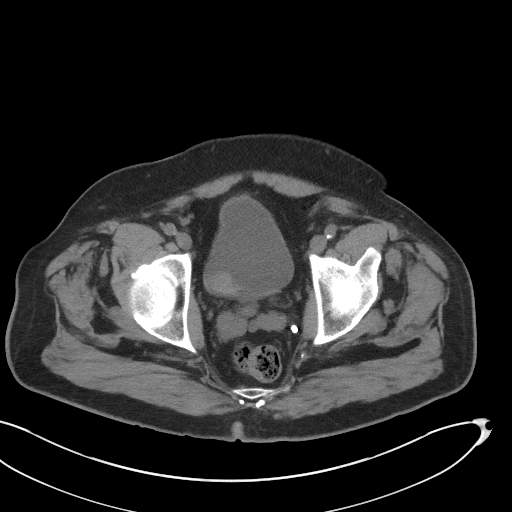
[im 28/98  soft-tissue]
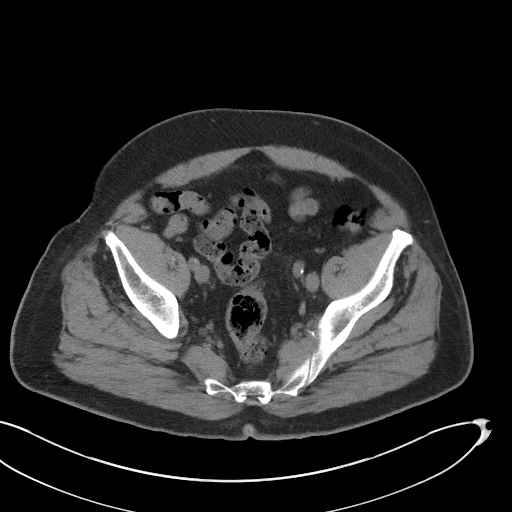
[im 35/98  soft-tissue]
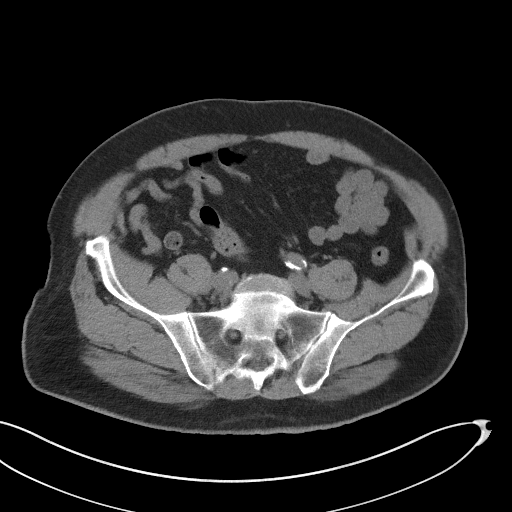
[im 42/98  soft-tissue]
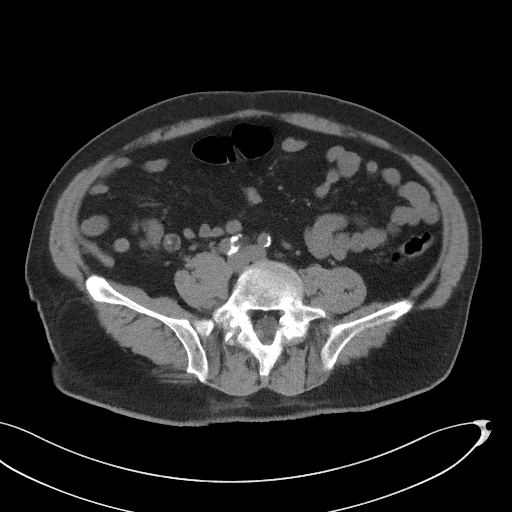
[im 49/98  soft-tissue]
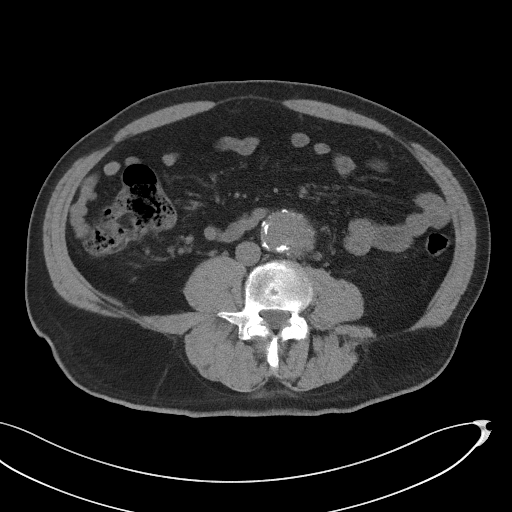
[im 56/98  soft-tissue]
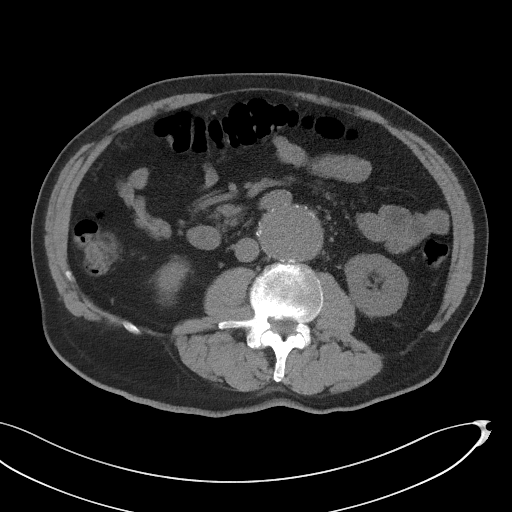
[im 63/98  soft-tissue]
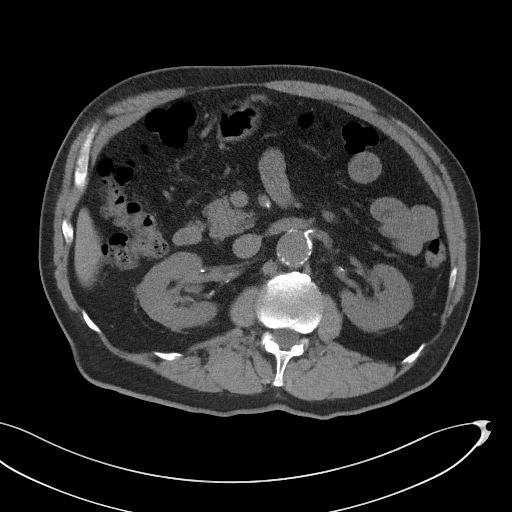
[im 63/98  bone]
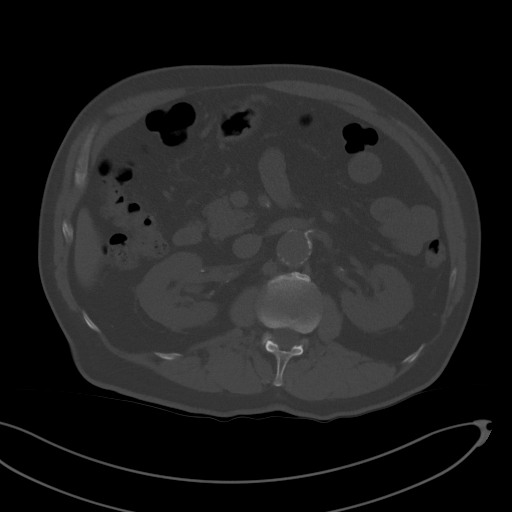
[im 70/98  soft-tissue]
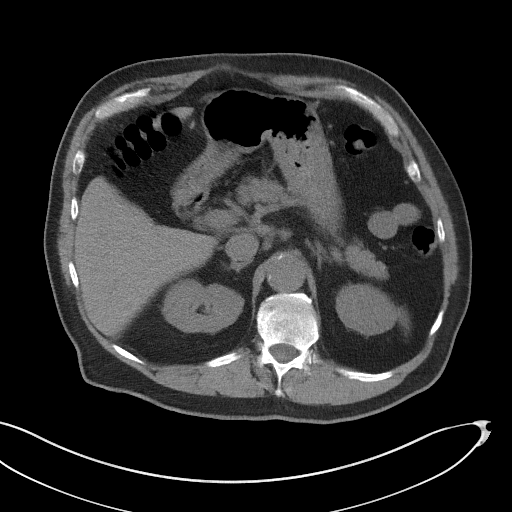
[im 77/98  soft-tissue]
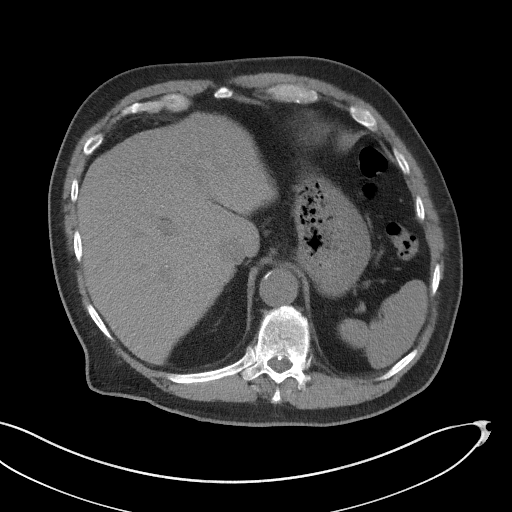
[im 84/98  soft-tissue]
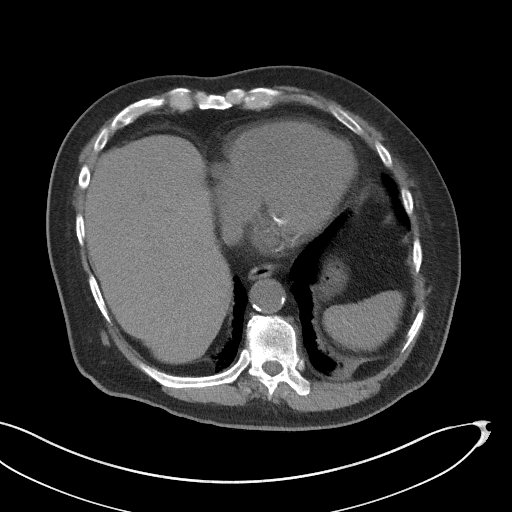
[im 91/98  soft-tissue]
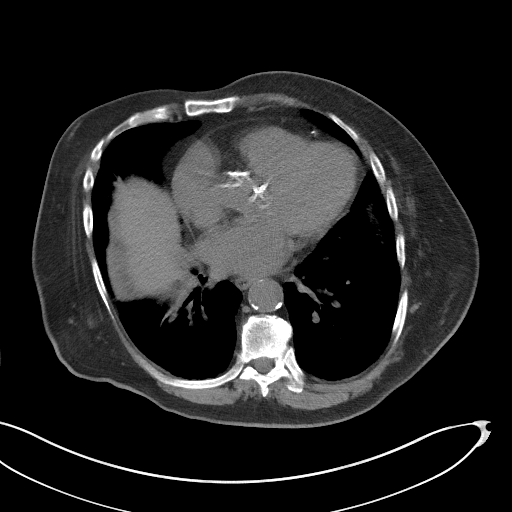

[Series 5: coronal st · coronal · 0.82mm/px · 3 of 113 slices shown]
[im 38/113  soft-tissue]
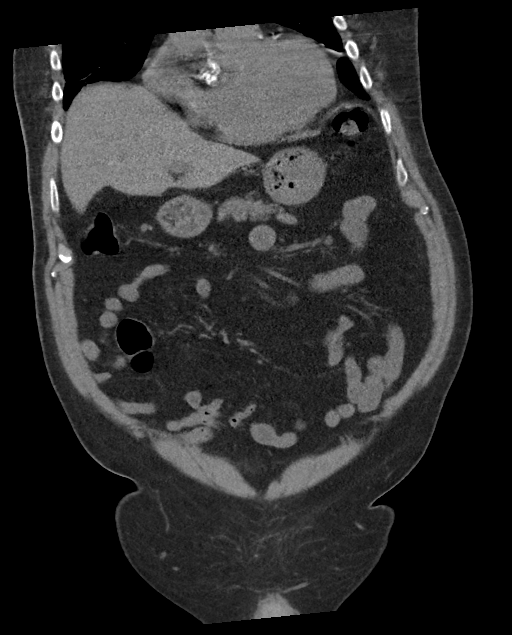
[im 50/113  soft-tissue]
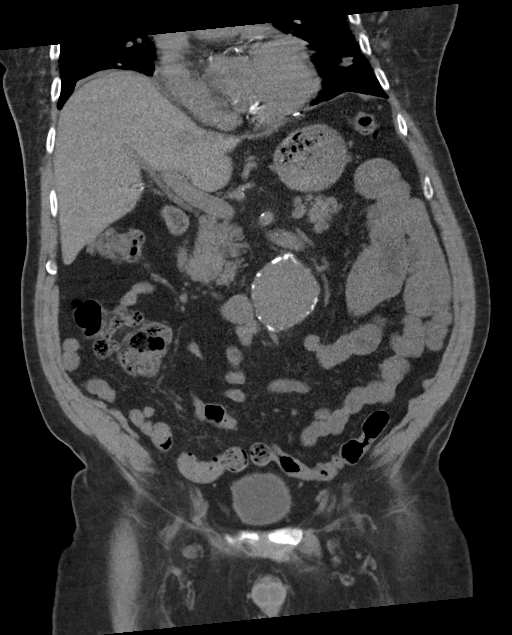
[im 63/113  soft-tissue]
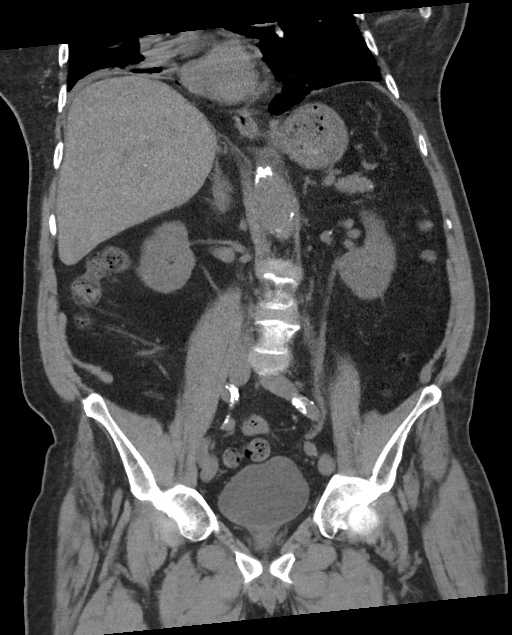

[16 of 46 positions shown; findings below may reference images not displayed]

FINDINGS: Lower chest: Slight atelectasis at the lung bases posteriorly.
Extensive coronary artery calcifications. Aortic atherosclerosis.
Heart size is normal. No pericardial effusion.

Hepatobiliary: No focal liver abnormality is seen. Status post
cholecystectomy. No biliary dilatation.

Pancreas: Unremarkable. No pancreatic ductal dilatation or
surrounding inflammatory changes.

Spleen: Normal in size without focal abnormality.

Adrenals/Urinary Tract: There is an irregular 4.2 by 2.0 by 1.7 cm
mass in the right posterolateral aspect of the bladder consistent
with a tumor. Adrenal glands are normal. 2.5 cm low-density lesion
on the lower pole of the right kidney, likely a cyst. Left kidney is
normal. no hydronephrosis.

Stomach/Bowel: Stomach is within normal limits. Appendix appears
normal. No evidence of bowel wall thickening, distention, or
inflammatory changes.

Vascular/Lymphatic: There is a 6.1 cm maximum diameter saccular
aneurysm of the abdominal aorta just below the level of the renal
arteries. No adenopathy.

Reproductive: Prostate is unremarkable.

Other: No abdominal wall hernia or abnormality. No abdominopelvic
ascites.

Musculoskeletal: No acute abnormality. Degenerative disc disease at
L2-3 and L5-S1. Bilateral pars defects at L5 with grade 1
spondylolisthesis at L5-S1.
IMPRESSION: 1. 4.2 x 2.0 x 1.7 cm mass in the right posterolateral aspect of the
bladder consistent with a tumor. No ureteral obstruction at this
time.
2. 6.1 cm maximum diameter saccular aneurysm of the abdominal aorta
just below the level of the renal arteries. Vascular surgery
consultation recommended due to increased risk of rupture for AAA
>5.5 cm. This recommendation follows ACR consensus guidelines: White
Paper of the ACR Incidental Findings Committee II on Vascular
Findings. [HOSPITAL] 5338; [DATE]. Aortic aneurysm NOS
(BUWB1-L9J.H)

Aortic Atherosclerosis (BUWB1-24L.L).

## 2020-10-07 DIAGNOSIS — C678 Malignant neoplasm of overlapping sites of bladder: Secondary | ICD-10-CM | POA: Diagnosis not present

## 2020-10-18 NOTE — Progress Notes (Signed)
Patient ID: [name], male   DOB: 04/25/1941, 80 y.o.   MRN: [id number]    79 y.o. history of distant LAD stent 2009. CRFls HTN and HLD. Activity limited by leg weakness from polio. Stays busy doing remodeling work. Sees hematiology for chronically elevated WBC no signs of lymphoma  Had EGD and colonoscopy 05/13/18 for heme positive stool and iron deficiency anemia Noted external Hemorrhoids and one small polyp removed Also had an area of gastric angiodysplasia Rx with clip/cautery  Myovue normal no ischemia 07/02/19 Echo with mild / moderate AS 06/25/19   Rx for bladder cancer. CT showed 6 cm AAA  Seen by Dr Scot Dock with open repair 10/14/19   No cardiac complaints Takes PRN diuretic for LE edema  ROS: Denies fever, malais, weight loss, blurry vision, decreased visual acuity, cough, sputum, SOB, hemoptysis, pleuritic pain, palpitaitons, heartburn, abdominal pain, melena, lower extremity edema, claudication, or rash.  All other systems reviewed and negative  General: BP 136/74   Pulse 62   Ht 5\' 6"  (1.676 m)   Wt 89.4 kg   SpO2 95%   BMI 31.80 kg/m   Affect appropriate Healthy:  appears stated age 34: normal Neck supple with no adenopathy JVP normal no bruits no thyromegaly Lungs clear with no wheezing and good diaphragmatic motion Heart:  S1/S2 AS murmur, no rub, gallop or click PMI normal Abdomen: benighn, BS positve, no tenderness, no AAA no bruit.  No HSM or HJR Distal pulses intact with no bruits Plus one bilateral edema Neuro non-focal Skin warm and dry Polio with atrophied right shoulder and arm  Post left TKR    Current Outpatient Medications  Medication Sig Dispense Refill  . aspirin EC 81 MG tablet Take 81 mg by mouth daily.    Marland Kitchen atorvastatin (LIPITOR) 40 MG tablet Take 1 tablet (40 mg total) by mouth daily. 90 tablet 3  . Cholecalciferol (VITAMIN D3 PO) Take 1 capsule by mouth daily.     . clopidogrel (PLAVIX) 75 MG tablet Take 1 tablet by mouth once  daily 90 tablet 0  . ELDERBERRY PO Take 1 tablet by mouth daily.     . ferrous sulfate 325 (65 FE) MG tablet Take 1 tablet (325 mg total) by mouth 2 (two) times daily with a meal. (Patient taking differently: Take 325 mg by mouth daily.)  3  . nitroGLYCERIN (NITROSTAT) 0.4 MG SL tablet Place 1 tablet (0.4 mg total) under the tongue every 5 (five) minutes x 3 doses as needed for chest pain. 25 tablet 3  . pantoprazole (PROTONIX) 40 MG tablet Take 1 tablet (40 mg total) by mouth daily. Take 30 min before meals     No current facility-administered medications for this visit.    Allergies  Tramadol  Electrocardiogram:    10/26/2020 SR rate 61 RBBB LAD no changes   Assessment and Plan  CAD: Distant LAD stent  Most recent myovue 07/02/19 normal EF 67% No angina Continue medical Rx   HTN: Well controlled.  Continue current medications and low sodium Dash type diet.    Chol:   LDL 75 most recent labs continue  lipitor 40 mg daily    Aortic Stenosis :  TTE reviewed 05/14/20 EF 60-65% mild/moderate AS with mean gradient 18 peak 34 mmHg AVA 1.6 cm2 DVI 0.47 f/u echo December 2022   Edema:  Dependant -Low dose diuretic HCTZ 12.5 mg  PRN Low sodium diet .   Anemia:  See notes regarding Endo/Colon on 05/31/18  Continue iron and prilosec   AAA:  Post repair Dr Scot Dock 10/14/19 ABI's normal 07/07/20 f/u US per [facility]   F/U in 6 months  Echo December 2022 for AS  [name]

## 2020-10-26 ENCOUNTER — Encounter: Payer: Self-pay | Admitting: Cardiovascular Disease

## 2020-10-26 ENCOUNTER — Ambulatory Visit: Payer: Medicare HMO | Admitting: Cardiovascular Disease

## 2020-10-26 ENCOUNTER — Other Ambulatory Visit: Payer: Self-pay

## 2020-10-26 VITALS — BP 136/74 | HR 62 | Ht 66.0 in | Wt 197.0 lb

## 2020-10-26 DIAGNOSIS — I2510 Atherosclerotic heart disease of native coronary artery without angina pectoris: Secondary | ICD-10-CM

## 2020-10-26 DIAGNOSIS — I10 Essential (primary) hypertension: Secondary | ICD-10-CM

## 2020-10-26 DIAGNOSIS — I350 Nonrheumatic aortic (valve) stenosis: Secondary | ICD-10-CM | POA: Diagnosis not present

## 2020-10-26 NOTE — Patient Instructions (Addendum)
Medication Instructions:  Continue all current medications.  Labwork: none  Testing/Procedures: Your physician has requested that you have an echocardiogram. Echocardiography is a painless test that uses sound waves to create images of your heart. It provides your doctor with information about the size and shape of your heart and how well your heart's chambers and valves are working. This procedure takes approximately one hour. There are no restrictions for this procedure - DUE IN December .  Follow-Up: December   Any Other Special Instructions Will Be Listed Below (If Applicable).  If you need a refill on your cardiac medications before your next appointment, please call your pharmacy.

## 2020-10-28 IMAGING — DX DG CHEST 1V PORT
1 series · 1 of 1 positions shown · non-contrast
Comparison: 12/05/2004

CLINICAL DATA: NG tube placement and central line placement.
Shortness of breath.

EXAM:
PORTABLE CHEST 1 VIEW

[chest]
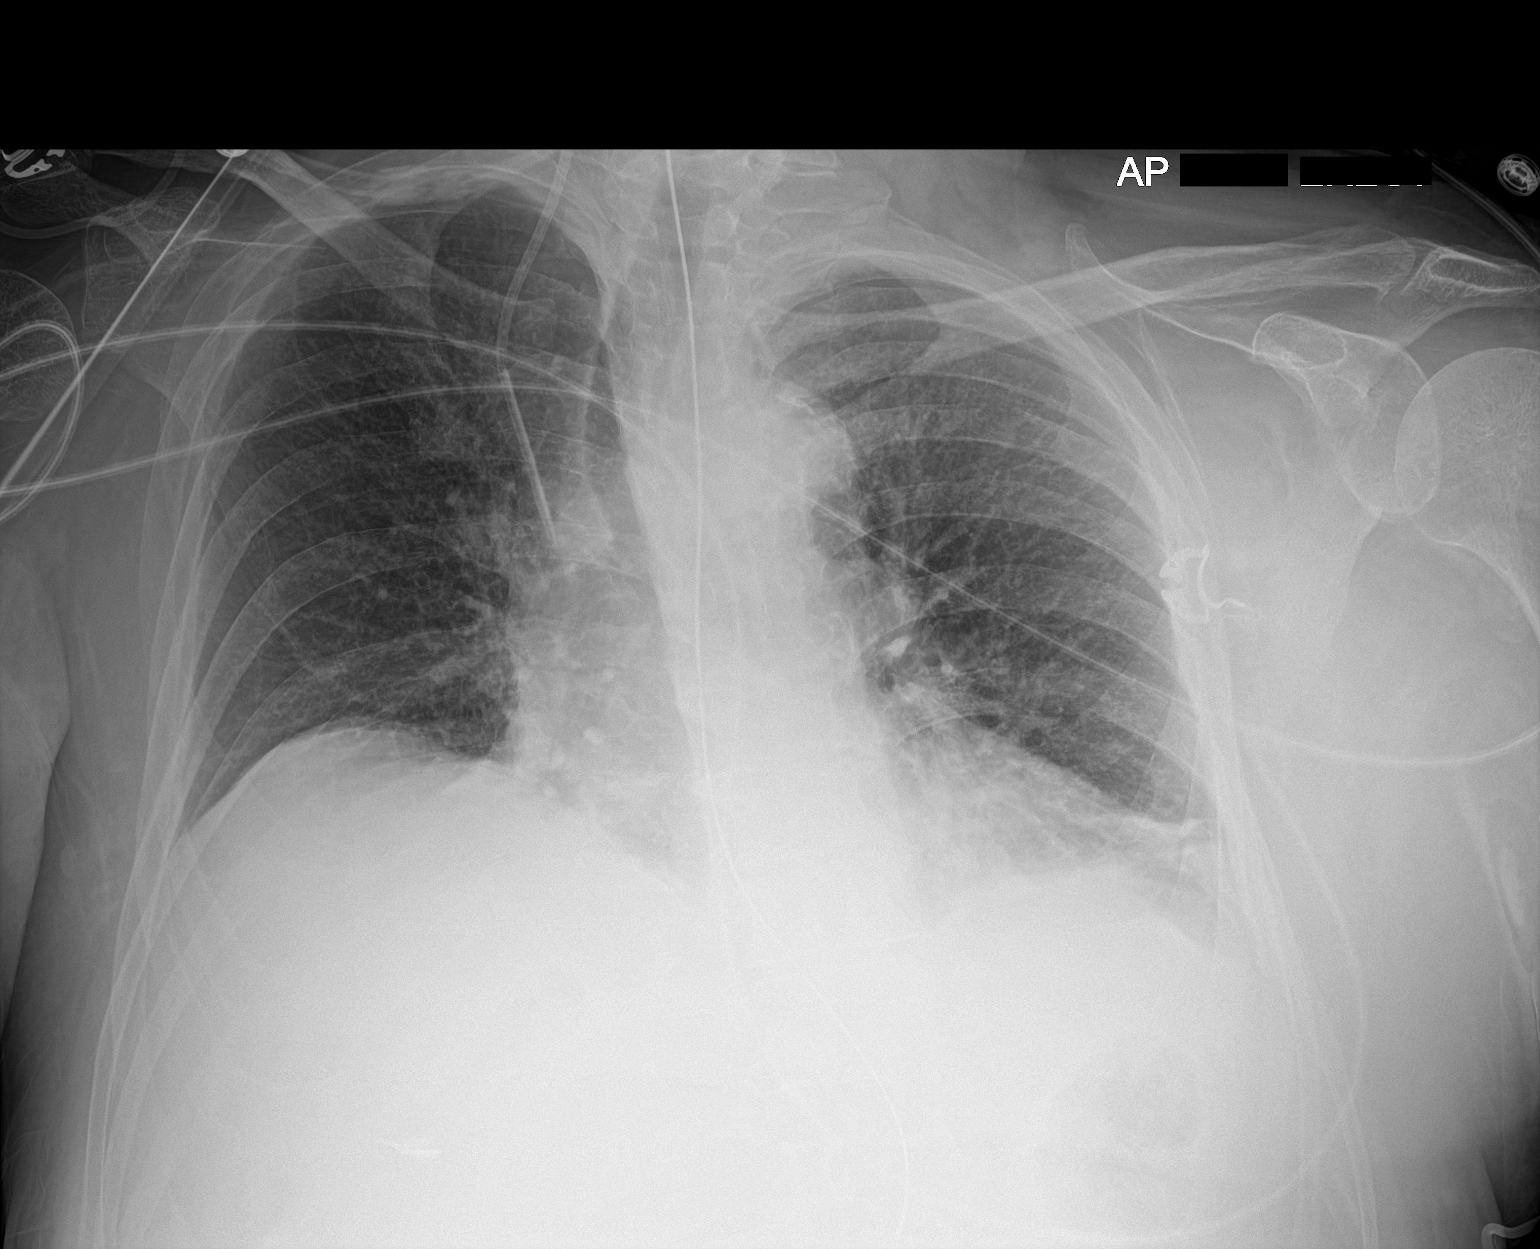

[1 of 1 positions shown; findings below may reference images not displayed]

FINDINGS: Patient is rotated to the left. Nasogastric tube courses into the
region of the stomach and off the film as tip is not visualized.
Right IJ central venous catheter has tip over the SVC. No
pneumothorax. Lungs are hypoinflated with minimal left basilar
density and mild right retrocardiac density suggesting atelectasis
although infection is possible. Minimal prominence of the perihilar
markings likely due to the degree of hypoinflation and less likely
vascular congestion. Cardiomediastinal silhouette and remainder of
the exam is unchanged.
IMPRESSION: 1. Hypoinflation with minimal bibasilar opacification suggesting
atelectasis and less likely infection. Possible mild vascular
congestion.

2.  Tubes and lines as described.

## 2020-10-28 IMAGING — DX DG ABD PORTABLE 1V
1 series · 3 of 3 positions shown · non-contrast
Comparison: None.

CLINICAL DATA: NG tube placement.

EXAM:
PORTABLE ABDOMEN - 1 VIEW

[Series 1: abdomen · 0.14mm/px · 3 of 3 slices shown]
[im 1/3]
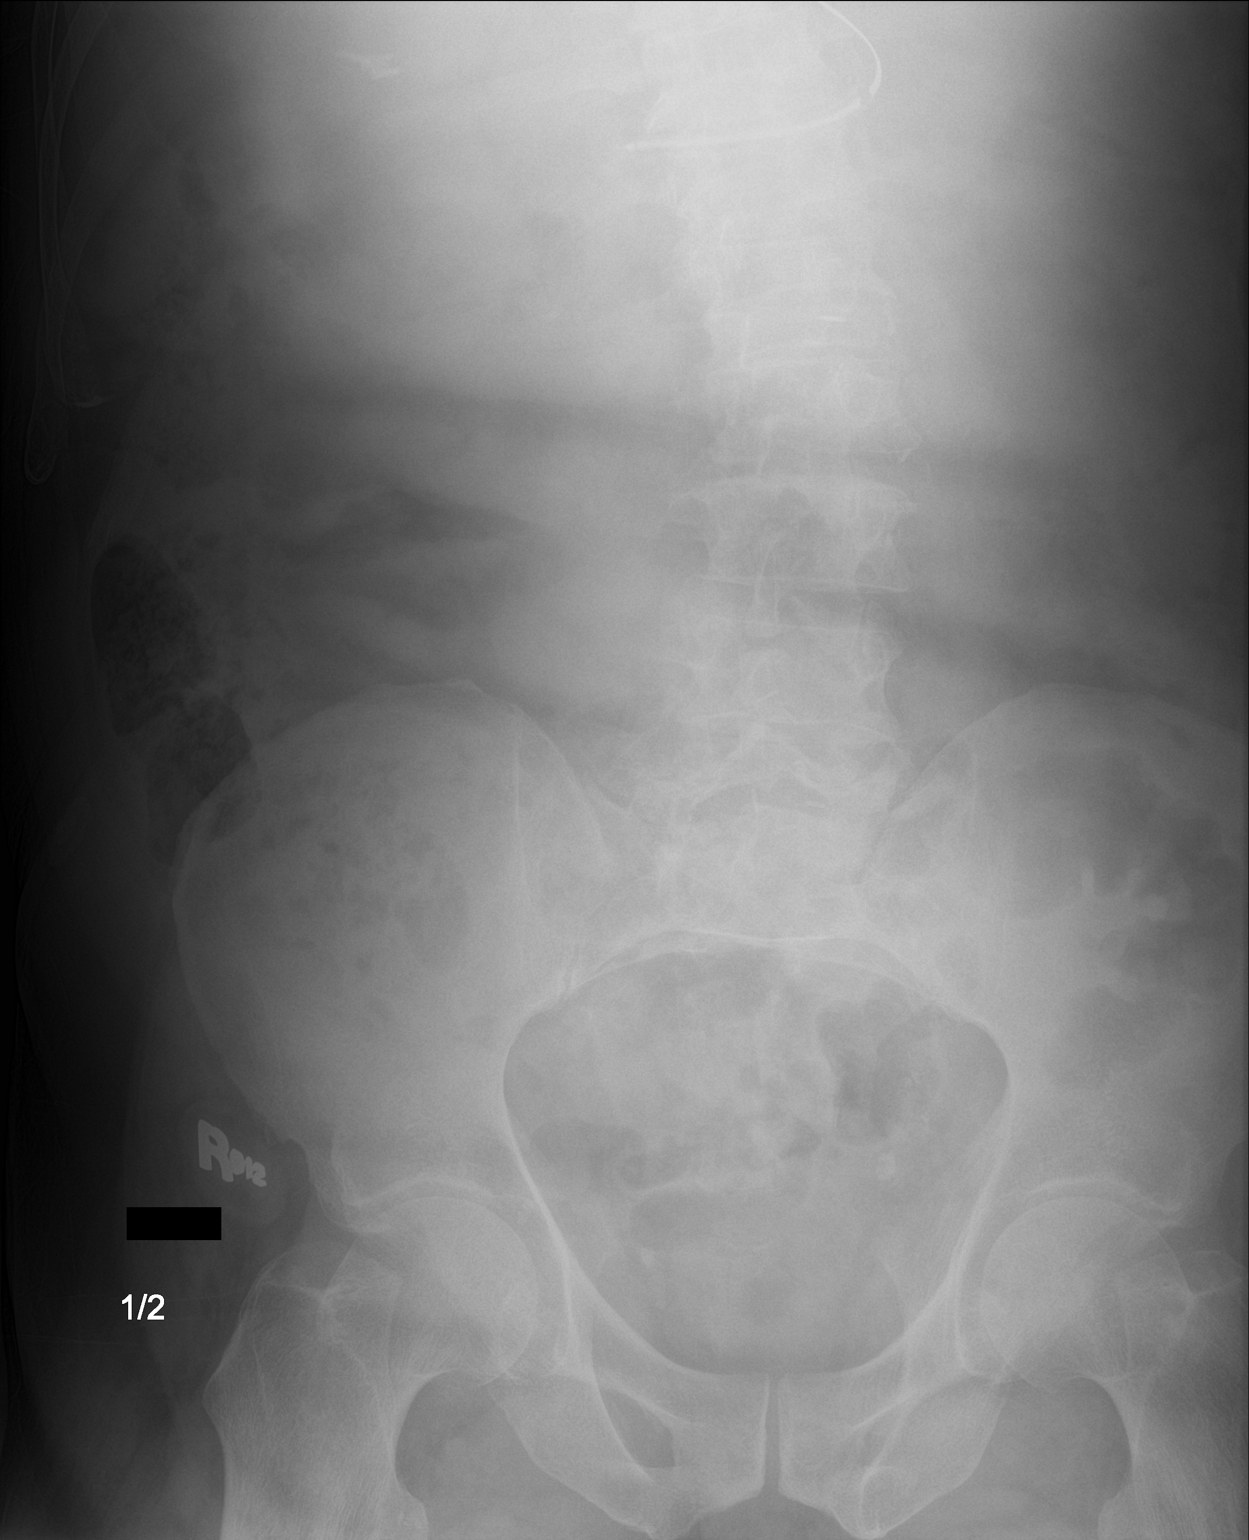
[im 2/3]
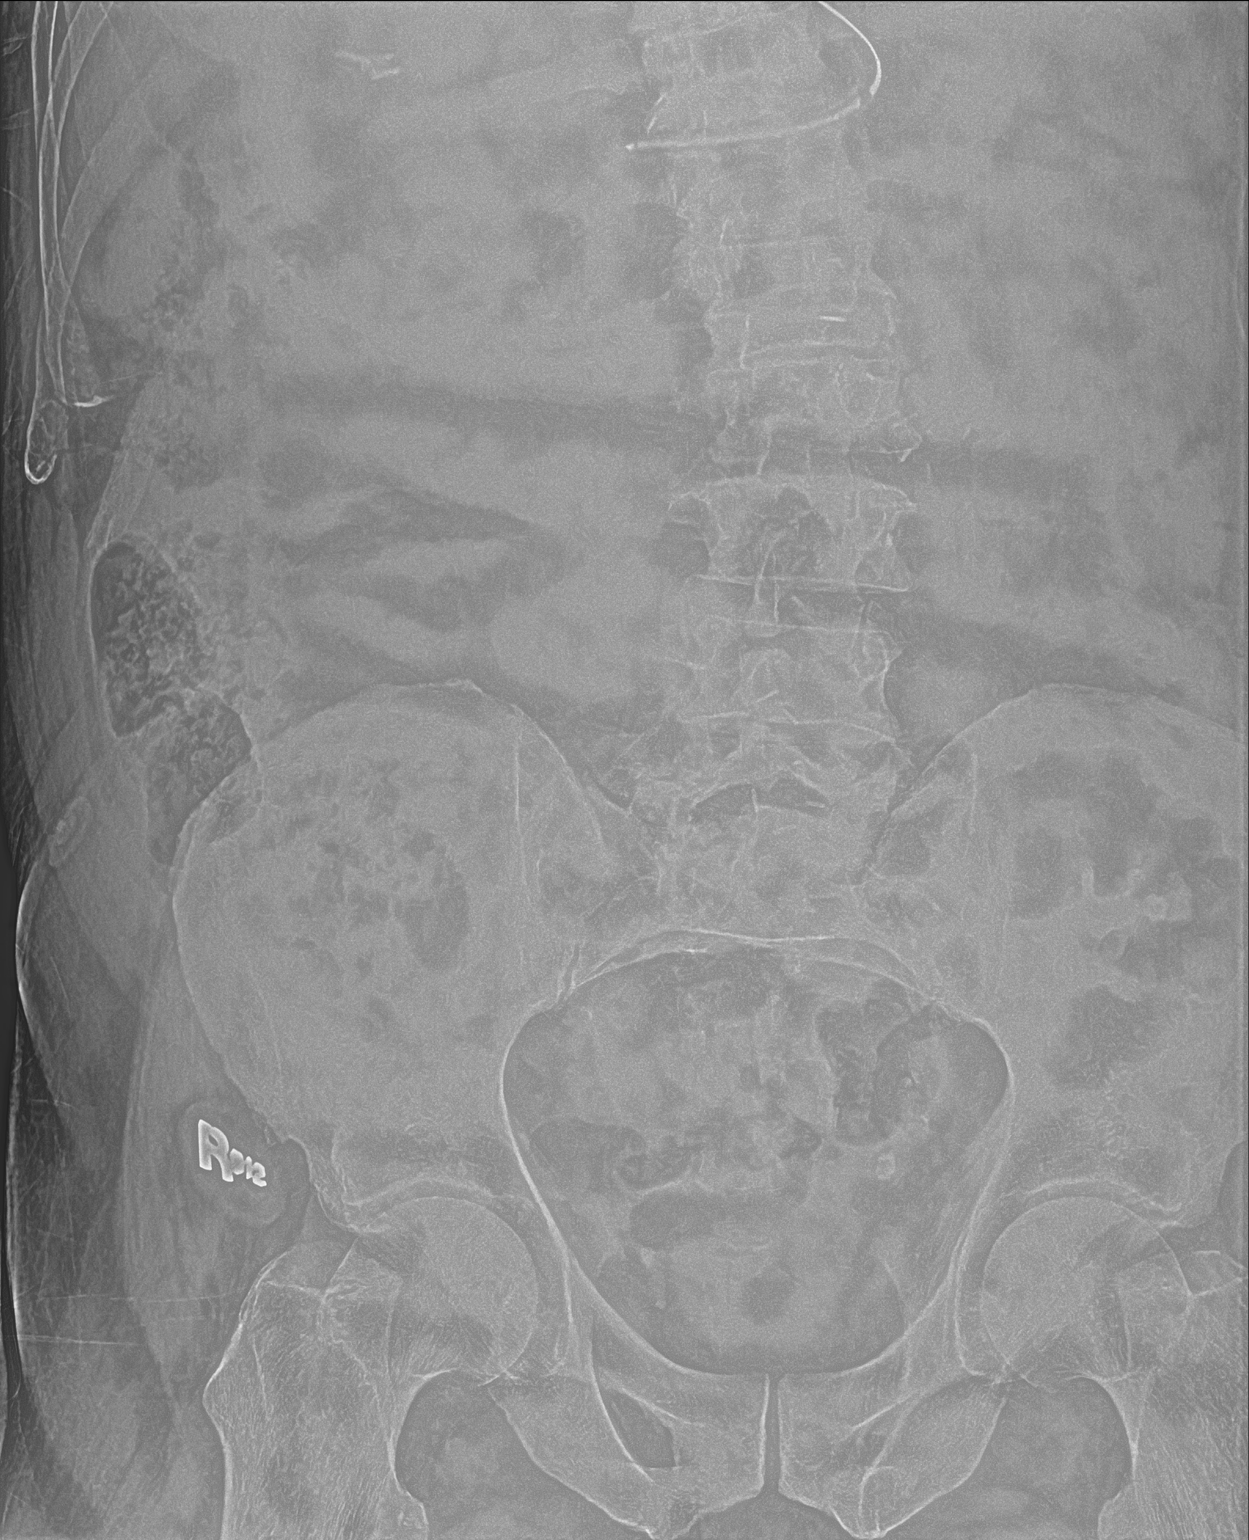
[im 3/3]
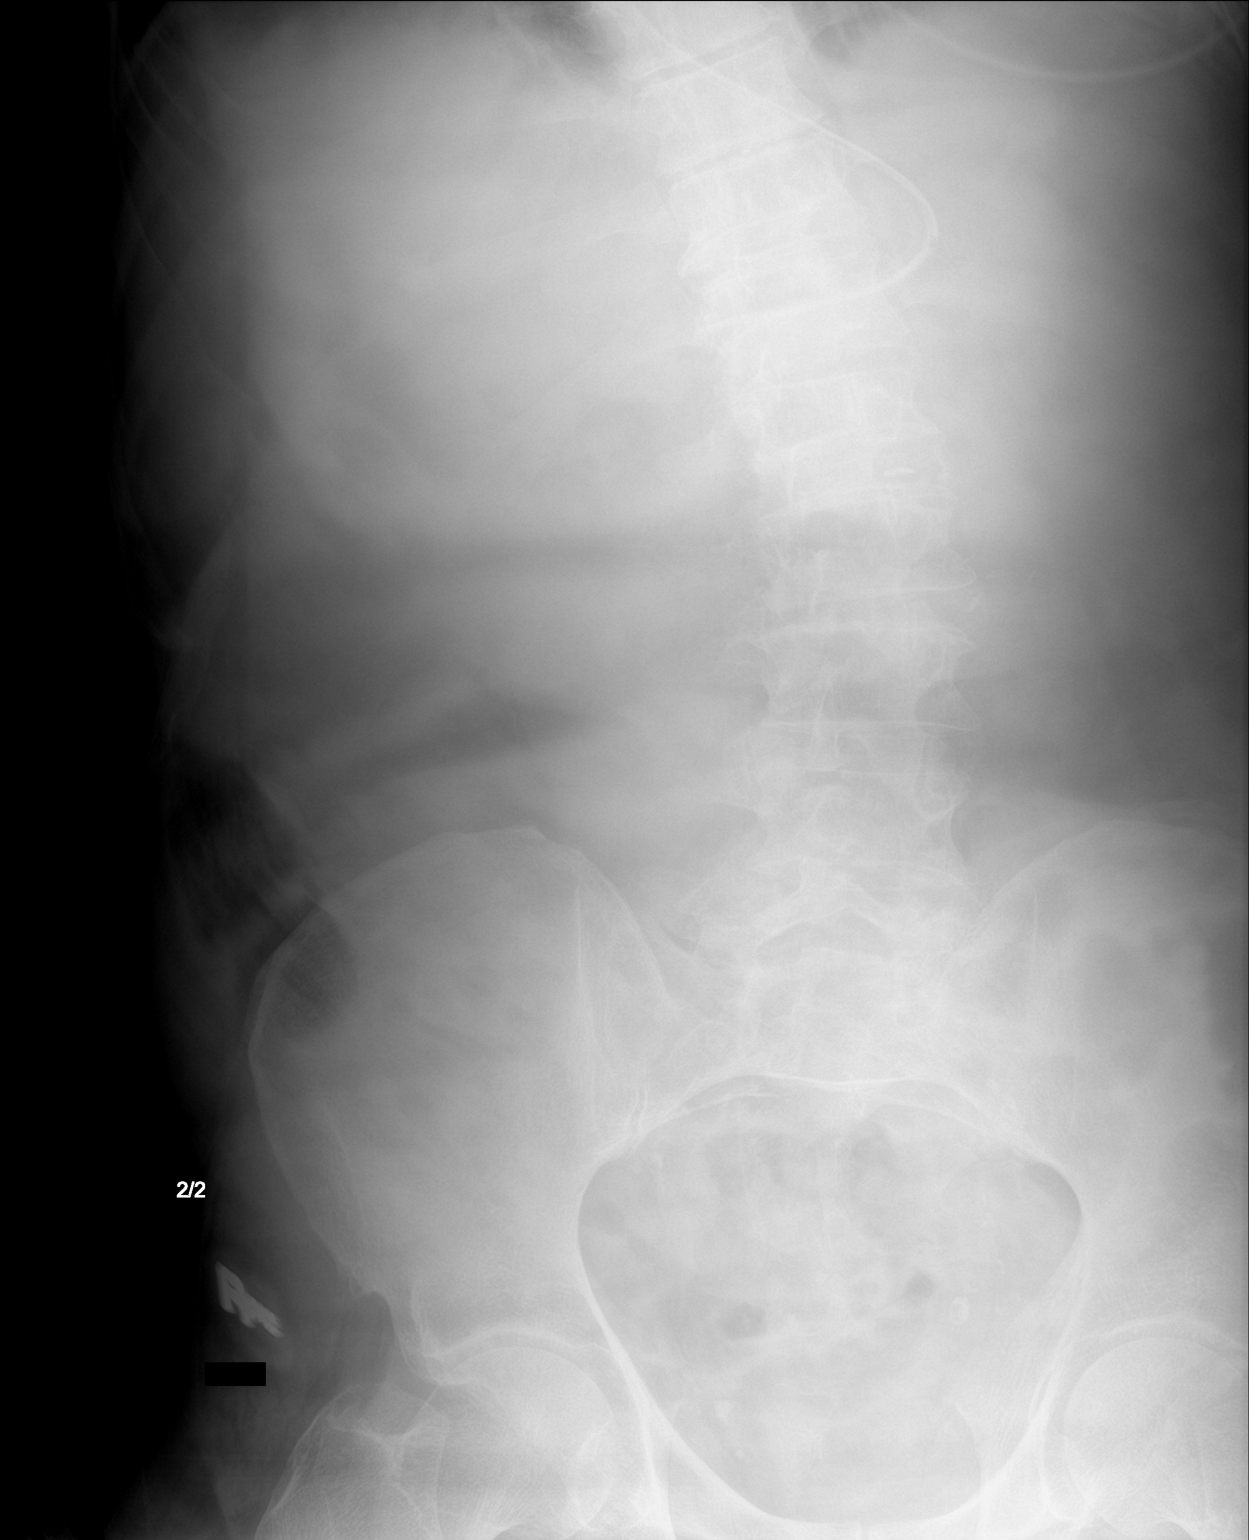

[3 of 3 positions shown; findings below may reference images not displayed]

FINDINGS: Evidence of patient's NG tube with tip just right of midline likely
over the distal stomach. Bowel gas pattern is nonobstructive. No
evidence of free peritoneal air. Degenerative changes of the spine
and minimal degenerative change of the hips. Few pelvic phleboliths
are present.
IMPRESSION: Nonobstructive bowel gas pattern.

NG tube with tip just right of midline likely over the distal
stomach.

## 2020-11-25 ENCOUNTER — Other Ambulatory Visit: Payer: Self-pay | Admitting: Cardiovascular Disease

## 2020-12-17 DIAGNOSIS — R7301 Impaired fasting glucose: Secondary | ICD-10-CM | POA: Diagnosis not present

## 2020-12-17 DIAGNOSIS — I10 Essential (primary) hypertension: Secondary | ICD-10-CM | POA: Diagnosis not present

## 2020-12-21 DIAGNOSIS — C678 Malignant neoplasm of overlapping sites of bladder: Secondary | ICD-10-CM | POA: Diagnosis not present

## 2020-12-21 DIAGNOSIS — I714 Abdominal aortic aneurysm, without rupture: Secondary | ICD-10-CM | POA: Diagnosis not present

## 2020-12-21 DIAGNOSIS — R7401 Elevation of levels of liver transaminase levels: Secondary | ICD-10-CM | POA: Diagnosis not present

## 2020-12-21 DIAGNOSIS — K219 Gastro-esophageal reflux disease without esophagitis: Secondary | ICD-10-CM | POA: Diagnosis not present

## 2020-12-21 DIAGNOSIS — E1169 Type 2 diabetes mellitus with other specified complication: Secondary | ICD-10-CM | POA: Diagnosis not present

## 2020-12-21 DIAGNOSIS — E782 Mixed hyperlipidemia: Secondary | ICD-10-CM | POA: Diagnosis not present

## 2020-12-21 DIAGNOSIS — J309 Allergic rhinitis, unspecified: Secondary | ICD-10-CM | POA: Diagnosis not present

## 2020-12-21 DIAGNOSIS — I10 Essential (primary) hypertension: Secondary | ICD-10-CM | POA: Diagnosis not present

## 2020-12-21 DIAGNOSIS — D72829 Elevated white blood cell count, unspecified: Secondary | ICD-10-CM | POA: Diagnosis not present

## 2020-12-24 ENCOUNTER — Encounter: Payer: Self-pay | Admitting: Cardiovascular Disease

## 2021-02-28 DIAGNOSIS — X32XXXD Exposure to sunlight, subsequent encounter: Secondary | ICD-10-CM | POA: Diagnosis not present

## 2021-02-28 DIAGNOSIS — L570 Actinic keratosis: Secondary | ICD-10-CM | POA: Diagnosis not present

## 2021-04-05 DIAGNOSIS — Z23 Encounter for immunization: Secondary | ICD-10-CM | POA: Diagnosis not present

## 2021-04-07 DIAGNOSIS — C678 Malignant neoplasm of overlapping sites of bladder: Secondary | ICD-10-CM | POA: Diagnosis not present

## 2021-04-24 ENCOUNTER — Other Ambulatory Visit: Payer: Self-pay | Admitting: Cardiovascular Disease

## 2021-04-26 ENCOUNTER — Other Ambulatory Visit: Payer: Self-pay

## 2021-04-26 DIAGNOSIS — I350 Nonrheumatic aortic (valve) stenosis: Secondary | ICD-10-CM

## 2021-05-11 ENCOUNTER — Other Ambulatory Visit: Payer: Self-pay

## 2021-05-11 ENCOUNTER — Ambulatory Visit (HOSPITAL_COMMUNITY)
Admission: RE | Admit: 2021-05-11 | Discharge: 2021-05-11 | Disposition: A | Discharge status: Home / Self Care | Payer: Medicare HMO | Source: Ambulatory Visit | Attending: Cardiovascular Disease | Admitting: Cardiovascular Disease

## 2021-05-11 DIAGNOSIS — I350 Nonrheumatic aortic (valve) stenosis: Secondary | ICD-10-CM | POA: Diagnosis not present

## 2021-05-11 LAB — ECHOCARDIOGRAM COMPLETE
AR max vel: 1.26 cm2
AV Area VTI: 1.41 cm2
AV Area mean vel: 1.34 cm2
AV Mean grad: 21 mmHg
AV Peak grad: 39.4 mmHg
Ao pk vel: 3.14 m/s
Area-P 1/2: 2.37 cm2
S' Lateral: 2.8 cm

## 2021-05-11 NOTE — Progress Notes (Signed)
*  PRELIMINARY RESULTS* Echocardiogram 2D Echocardiogram has been performed.  Samuel Germany 05/11/2021, 12:50 PM

## 2021-05-16 DIAGNOSIS — C678 Malignant neoplasm of overlapping sites of bladder: Secondary | ICD-10-CM | POA: Diagnosis not present

## 2021-05-16 DIAGNOSIS — R110 Nausea: Secondary | ICD-10-CM | POA: Diagnosis not present

## 2021-05-16 DIAGNOSIS — R0609 Other forms of dyspnea: Secondary | ICD-10-CM | POA: Diagnosis not present

## 2021-05-16 DIAGNOSIS — R5383 Other fatigue: Secondary | ICD-10-CM | POA: Diagnosis not present

## 2021-05-17 ENCOUNTER — Other Ambulatory Visit: Payer: Self-pay | Admitting: Internal Medicine

## 2021-05-17 ENCOUNTER — Other Ambulatory Visit (HOSPITAL_COMMUNITY): Payer: Self-pay | Admitting: Internal Medicine

## 2021-05-17 DIAGNOSIS — R110 Nausea: Secondary | ICD-10-CM

## 2021-05-19 ENCOUNTER — Inpatient Hospital Stay (HOSPITAL_COMMUNITY)
Admission: EM | Admit: 2021-05-19 | Discharge: 2021-05-27 | DRG: 286 | Disposition: A | Discharge status: Skilled Nursing Facility | Payer: Medicare HMO | Attending: Internal Medicine | Admitting: Internal Medicine

## 2021-05-19 ENCOUNTER — Inpatient Hospital Stay (HOSPITAL_COMMUNITY): Payer: Medicare HMO

## 2021-05-19 ENCOUNTER — Emergency Department (HOSPITAL_COMMUNITY): Payer: Medicare HMO

## 2021-05-19 ENCOUNTER — Encounter (HOSPITAL_COMMUNITY): Payer: Self-pay | Admitting: *Deleted

## 2021-05-19 DIAGNOSIS — I25110 Atherosclerotic heart disease of native coronary artery with unstable angina pectoris: Secondary | ICD-10-CM | POA: Diagnosis not present

## 2021-05-19 DIAGNOSIS — R011 Cardiac murmur, unspecified: Secondary | ICD-10-CM | POA: Diagnosis not present

## 2021-05-19 DIAGNOSIS — J9601 Acute respiratory failure with hypoxia: Secondary | ICD-10-CM | POA: Diagnosis present

## 2021-05-19 DIAGNOSIS — R911 Solitary pulmonary nodule: Secondary | ICD-10-CM | POA: Diagnosis present

## 2021-05-19 DIAGNOSIS — I248 Other forms of acute ischemic heart disease: Secondary | ICD-10-CM | POA: Diagnosis present

## 2021-05-19 DIAGNOSIS — R109 Unspecified abdominal pain: Secondary | ICD-10-CM | POA: Diagnosis not present

## 2021-05-19 DIAGNOSIS — R29818 Other symptoms and signs involving the nervous system: Secondary | ICD-10-CM | POA: Diagnosis not present

## 2021-05-19 DIAGNOSIS — I214 Non-ST elevation (NSTEMI) myocardial infarction: Secondary | ICD-10-CM | POA: Diagnosis not present

## 2021-05-19 DIAGNOSIS — N3289 Other specified disorders of bladder: Secondary | ICD-10-CM | POA: Diagnosis not present

## 2021-05-19 DIAGNOSIS — I4510 Unspecified right bundle-branch block: Secondary | ICD-10-CM | POA: Diagnosis not present

## 2021-05-19 DIAGNOSIS — M47812 Spondylosis without myelopathy or radiculopathy, cervical region: Secondary | ICD-10-CM | POA: Diagnosis not present

## 2021-05-19 DIAGNOSIS — K6389 Other specified diseases of intestine: Secondary | ICD-10-CM | POA: Diagnosis not present

## 2021-05-19 DIAGNOSIS — Z7982 Long term (current) use of aspirin: Secondary | ICD-10-CM | POA: Diagnosis not present

## 2021-05-19 DIAGNOSIS — H9190 Unspecified hearing loss, unspecified ear: Secondary | ICD-10-CM

## 2021-05-19 DIAGNOSIS — Z20822 Contact with and (suspected) exposure to covid-19: Secondary | ICD-10-CM | POA: Diagnosis not present

## 2021-05-19 DIAGNOSIS — T884XXA Failed or difficult intubation, initial encounter: Secondary | ICD-10-CM

## 2021-05-19 DIAGNOSIS — Z9049 Acquired absence of other specified parts of digestive tract: Secondary | ICD-10-CM | POA: Diagnosis not present

## 2021-05-19 DIAGNOSIS — I714 Abdominal aortic aneurysm, without rupture, unspecified: Secondary | ICD-10-CM | POA: Diagnosis not present

## 2021-05-19 DIAGNOSIS — Z8551 Personal history of malignant neoplasm of bladder: Secondary | ICD-10-CM | POA: Diagnosis not present

## 2021-05-19 DIAGNOSIS — Z885 Allergy status to narcotic agent status: Secondary | ICD-10-CM

## 2021-05-19 DIAGNOSIS — Z4682 Encounter for fitting and adjustment of non-vascular catheter: Secondary | ICD-10-CM | POA: Diagnosis not present

## 2021-05-19 DIAGNOSIS — B91 Sequelae of poliomyelitis: Secondary | ICD-10-CM

## 2021-05-19 DIAGNOSIS — Z7902 Long term (current) use of antithrombotics/antiplatelets: Secondary | ICD-10-CM

## 2021-05-19 DIAGNOSIS — G8191 Hemiplegia, unspecified affecting right dominant side: Secondary | ICD-10-CM | POA: Diagnosis present

## 2021-05-19 DIAGNOSIS — I2541 Coronary artery aneurysm: Secondary | ICD-10-CM | POA: Diagnosis present

## 2021-05-19 DIAGNOSIS — G9340 Encephalopathy, unspecified: Secondary | ICD-10-CM

## 2021-05-19 DIAGNOSIS — Z79899 Other long term (current) drug therapy: Secondary | ICD-10-CM

## 2021-05-19 DIAGNOSIS — Z0189 Encounter for other specified special examinations: Secondary | ICD-10-CM

## 2021-05-19 DIAGNOSIS — I6522 Occlusion and stenosis of left carotid artery: Secondary | ICD-10-CM | POA: Diagnosis not present

## 2021-05-19 DIAGNOSIS — G928 Other toxic encephalopathy: Secondary | ICD-10-CM | POA: Diagnosis present

## 2021-05-19 DIAGNOSIS — E669 Obesity, unspecified: Secondary | ICD-10-CM | POA: Diagnosis not present

## 2021-05-19 DIAGNOSIS — Z955 Presence of coronary angioplasty implant and graft: Secondary | ICD-10-CM

## 2021-05-19 DIAGNOSIS — J9811 Atelectasis: Secondary | ICD-10-CM | POA: Diagnosis not present

## 2021-05-19 DIAGNOSIS — E876 Hypokalemia: Secondary | ICD-10-CM | POA: Diagnosis not present

## 2021-05-19 DIAGNOSIS — R748 Abnormal levels of other serum enzymes: Secondary | ICD-10-CM | POA: Diagnosis present

## 2021-05-19 DIAGNOSIS — J9602 Acute respiratory failure with hypercapnia: Secondary | ICD-10-CM | POA: Diagnosis present

## 2021-05-19 DIAGNOSIS — I517 Cardiomegaly: Secondary | ICD-10-CM | POA: Diagnosis not present

## 2021-05-19 DIAGNOSIS — R0902 Hypoxemia: Secondary | ICD-10-CM

## 2021-05-19 DIAGNOSIS — R609 Edema, unspecified: Secondary | ICD-10-CM | POA: Diagnosis not present

## 2021-05-19 DIAGNOSIS — G4733 Obstructive sleep apnea (adult) (pediatric): Secondary | ICD-10-CM | POA: Diagnosis present

## 2021-05-19 DIAGNOSIS — J189 Pneumonia, unspecified organism: Secondary | ICD-10-CM | POA: Diagnosis not present

## 2021-05-19 DIAGNOSIS — I350 Nonrheumatic aortic (valve) stenosis: Secondary | ICD-10-CM | POA: Diagnosis not present

## 2021-05-19 DIAGNOSIS — J939 Pneumothorax, unspecified: Secondary | ICD-10-CM

## 2021-05-19 DIAGNOSIS — I7789 Other specified disorders of arteries and arterioles: Secondary | ICD-10-CM | POA: Diagnosis not present

## 2021-05-19 DIAGNOSIS — I10 Essential (primary) hypertension: Secondary | ICD-10-CM | POA: Diagnosis not present

## 2021-05-19 DIAGNOSIS — D509 Iron deficiency anemia, unspecified: Secondary | ICD-10-CM | POA: Diagnosis present

## 2021-05-19 DIAGNOSIS — K219 Gastro-esophageal reflux disease without esophagitis: Secondary | ICD-10-CM | POA: Diagnosis not present

## 2021-05-19 DIAGNOSIS — Z6832 Body mass index (BMI) 32.0-32.9, adult: Secondary | ICD-10-CM | POA: Diagnosis not present

## 2021-05-19 DIAGNOSIS — I25118 Atherosclerotic heart disease of native coronary artery with other forms of angina pectoris: Secondary | ICD-10-CM | POA: Diagnosis not present

## 2021-05-19 DIAGNOSIS — E785 Hyperlipidemia, unspecified: Secondary | ICD-10-CM | POA: Diagnosis not present

## 2021-05-19 DIAGNOSIS — F40240 Claustrophobia: Secondary | ICD-10-CM | POA: Diagnosis not present

## 2021-05-19 DIAGNOSIS — R079 Chest pain, unspecified: Secondary | ICD-10-CM | POA: Diagnosis not present

## 2021-05-19 DIAGNOSIS — Z4659 Encounter for fitting and adjustment of other gastrointestinal appliance and device: Secondary | ICD-10-CM

## 2021-05-19 DIAGNOSIS — I672 Cerebral atherosclerosis: Secondary | ICD-10-CM | POA: Diagnosis not present

## 2021-05-19 DIAGNOSIS — Y95 Nosocomial condition: Secondary | ICD-10-CM | POA: Diagnosis not present

## 2021-05-19 DIAGNOSIS — R7401 Elevation of levels of liver transaminase levels: Secondary | ICD-10-CM

## 2021-05-19 DIAGNOSIS — Z959 Presence of cardiac and vascular implant and graft, unspecified: Secondary | ICD-10-CM

## 2021-05-19 DIAGNOSIS — I739 Peripheral vascular disease, unspecified: Secondary | ICD-10-CM | POA: Diagnosis present

## 2021-05-19 DIAGNOSIS — A809 Acute poliomyelitis, unspecified: Secondary | ICD-10-CM | POA: Diagnosis present

## 2021-05-19 DIAGNOSIS — Z87891 Personal history of nicotine dependence: Secondary | ICD-10-CM

## 2021-05-19 DIAGNOSIS — Z8249 Family history of ischemic heart disease and other diseases of the circulatory system: Secondary | ICD-10-CM | POA: Diagnosis not present

## 2021-05-19 DIAGNOSIS — D500 Iron deficiency anemia secondary to blood loss (chronic): Secondary | ICD-10-CM | POA: Diagnosis present

## 2021-05-19 DIAGNOSIS — I2510 Atherosclerotic heart disease of native coronary artery without angina pectoris: Secondary | ICD-10-CM | POA: Diagnosis not present

## 2021-05-19 DIAGNOSIS — J90 Pleural effusion, not elsewhere classified: Secondary | ICD-10-CM | POA: Diagnosis not present

## 2021-05-19 DIAGNOSIS — D72829 Elevated white blood cell count, unspecified: Secondary | ICD-10-CM | POA: Diagnosis present

## 2021-05-19 DIAGNOSIS — R0602 Shortness of breath: Secondary | ICD-10-CM | POA: Diagnosis not present

## 2021-05-19 DIAGNOSIS — I700 Atherosclerosis of aorta: Secondary | ICD-10-CM | POA: Diagnosis not present

## 2021-05-19 LAB — CBC WITH DIFFERENTIAL/PLATELET
Abs Immature Granulocytes: 0.15 10*3/uL — ABNORMAL HIGH (ref 0.00–0.07)
Basophils Absolute: 0.1 10*3/uL (ref 0.0–0.1)
Basophils Relative: 1 %
Eosinophils Absolute: 0 10*3/uL (ref 0.0–0.5)
Eosinophils Relative: 0 %
HCT: 39.6 % (ref 39.0–52.0)
Hemoglobin: 10.3 g/dL — ABNORMAL LOW (ref 13.0–17.0)
Immature Granulocytes: 1 %
Lymphocytes Relative: 9 %
Lymphs Abs: 1.3 10*3/uL (ref 0.7–4.0)
MCH: 21.4 pg — ABNORMAL LOW (ref 26.0–34.0)
MCHC: 26 g/dL — ABNORMAL LOW (ref 30.0–36.0)
MCV: 82.3 fL (ref 80.0–100.0)
Monocytes Absolute: 1.8 10*3/uL — ABNORMAL HIGH (ref 0.1–1.0)
Monocytes Relative: 12 %
Neutro Abs: 11.3 10*3/uL — ABNORMAL HIGH (ref 1.7–7.7)
Neutrophils Relative %: 77 %
Platelets: 362 10*3/uL (ref 150–400)
RBC: 4.81 MIL/uL (ref 4.22–5.81)
RDW: 17.7 % — ABNORMAL HIGH (ref 11.5–15.5)
WBC: 14.6 10*3/uL — ABNORMAL HIGH (ref 4.0–10.5)
nRBC: 1.1 % — ABNORMAL HIGH (ref 0.0–0.2)

## 2021-05-19 LAB — COMPREHENSIVE METABOLIC PANEL
ALT: 272 U/L — ABNORMAL HIGH (ref 0–44)
AST: 303 U/L — ABNORMAL HIGH (ref 15–41)
Albumin: 4.2 g/dL (ref 3.5–5.0)
Alkaline Phosphatase: 81 U/L (ref 38–126)
Anion gap: 9 (ref 5–15)
BUN: 29 mg/dL — ABNORMAL HIGH (ref 8–23)
CO2: 32 mmol/L (ref 22–32)
Calcium: 9.1 mg/dL (ref 8.9–10.3)
Chloride: 95 mmol/L — ABNORMAL LOW (ref 98–111)
Creatinine, Ser: 1.13 mg/dL (ref 0.61–1.24)
GFR, Estimated: >60 mL/min (ref >60)
Glucose, Bld: 156 mg/dL — ABNORMAL HIGH (ref 70–99)
Potassium: 4.5 mmol/L (ref 3.5–5.1)
Sodium: 136 mmol/L (ref 135–145)
Total Bilirubin: 0.7 mg/dL (ref 0.3–1.2)
Total Protein: 8 g/dL (ref 6.5–8.1)

## 2021-05-19 LAB — HEPATITIS PANEL, ACUTE
HCV Ab: NONREACTIVE
Hep A IgM: NONREACTIVE
Hep B C IgM: NONREACTIVE
Hepatitis B Surface Ag: NONREACTIVE

## 2021-05-19 LAB — TROPONIN I (HIGH SENSITIVITY)
Troponin I (High Sensitivity): 1033 ng/L (ref <18)
Troponin I (High Sensitivity): 1452 ng/L (ref <18)
Troponin I (High Sensitivity): 1507 ng/L (ref <18)

## 2021-05-19 LAB — RESP PANEL BY RT-PCR (FLU A&B, COVID) ARPGX2
Influenza A by PCR: NEGATIVE
Influenza B by PCR: NEGATIVE
SARS Coronavirus 2 by RT PCR: NEGATIVE

## 2021-05-19 LAB — VITAMIN D 25 HYDROXY (VIT D DEFICIENCY, FRACTURES): Vit D, 25-Hydroxy: 30.31 ng/mL (ref 30–100)

## 2021-05-19 LAB — LIPASE, BLOOD: Lipase: 32 U/L (ref 11–51)

## 2021-05-19 LAB — HEPARIN LEVEL (UNFRACTIONATED): Heparin Unfractionated: 0.2 IU/mL — ABNORMAL LOW (ref 0.30–0.70)

## 2021-05-19 LAB — HEMOGLOBIN A1C
Hgb A1c MFr Bld: 6.4 % — ABNORMAL HIGH (ref 4.8–5.6)
Mean Plasma Glucose: 136.98 mg/dL

## 2021-05-19 LAB — GLUCOSE, CAPILLARY: Glucose-Capillary: 140 mg/dL — ABNORMAL HIGH (ref 70–99)

## 2021-05-19 LAB — LACTIC ACID, PLASMA: Lactic Acid, Venous: 1.5 mmol/L (ref 0.5–1.9)

## 2021-05-19 LAB — TSH: TSH: 0.762 u[IU]/mL (ref 0.350–4.500)

## 2021-05-19 LAB — CBG MONITORING, ED: Glucose-Capillary: 109 mg/dL — ABNORMAL HIGH (ref 70–99)

## 2021-05-19 MED ORDER — ASPIRIN 325 MG PO TABS
325.0000 mg | ORAL_TABLET | Freq: Every day | ORAL | Status: DC
  Administered 2021-05-19 – 2021-05-21 (×2): 325 mg via ORAL
  Filled 2021-05-19 (×2): qty 1

## 2021-05-19 MED ORDER — SODIUM CHLORIDE 0.9 % IV SOLN: 250.0000 mL | INTRAVENOUS | Status: DC | PRN

## 2021-05-19 MED ORDER — INSULIN ASPART 100 UNIT/ML IJ SOLN
0.0000 [IU] | Freq: Three times a day (TID) | INTRAMUSCULAR | Status: DC
  Administered 2021-05-20 – 2021-05-22 (×2): 2 [IU] via SUBCUTANEOUS
  Administered 2021-05-23 – 2021-05-25 (×5): 1 [IU] via SUBCUTANEOUS
  Administered 2021-05-25: 17:00:00 2 [IU] via SUBCUTANEOUS
  Administered 2021-05-25 – 2021-05-26 (×2): 1 [IU] via SUBCUTANEOUS
  Administered 2021-05-26 – 2021-05-27 (×2): 2 [IU] via SUBCUTANEOUS
  Administered 2021-05-27: 1 [IU] via SUBCUTANEOUS

## 2021-05-19 MED ORDER — ONDANSETRON HCL 4 MG PO TABS: 4.0000 mg | ORAL_TABLET | Freq: Four times a day (QID) | ORAL | Status: DC | PRN

## 2021-05-19 MED ORDER — NITROGLYCERIN 0.4 MG SL SUBL: 0.4000 mg | SUBLINGUAL_TABLET | SUBLINGUAL | Status: DC | PRN

## 2021-05-19 MED ORDER — HEPARIN BOLUS VIA INFUSION
4000.0000 [IU] | Freq: Once | INTRAVENOUS | Status: AC
  Administered 2021-05-19: 4000 [IU] via INTRAVENOUS

## 2021-05-19 MED ORDER — IOHEXOL 300 MG/ML  SOLN
100.0000 mL | Freq: Once | INTRAMUSCULAR | Status: AC | PRN
  Administered 2021-05-19: 100 mL via INTRAVENOUS

## 2021-05-19 MED ORDER — PANTOPRAZOLE SODIUM 40 MG IV SOLR
40.0000 mg | Freq: Two times a day (BID) | INTRAVENOUS | Status: DC
  Administered 2021-05-19 – 2021-05-22 (×5): 40 mg via INTRAVENOUS
  Filled 2021-05-19 (×5): qty 40

## 2021-05-19 MED ORDER — SODIUM CHLORIDE 0.9% FLUSH: 3.0000 mL | INTRAVENOUS | Status: DC | PRN

## 2021-05-19 MED ORDER — SODIUM CHLORIDE 0.9% FLUSH
3.0000 mL | Freq: Two times a day (BID) | INTRAVENOUS | Status: DC
  Administered 2021-05-19 – 2021-05-21 (×4): 3 mL via INTRAVENOUS

## 2021-05-19 MED ORDER — ONDANSETRON HCL 4 MG/2ML IJ SOLN: 4.0000 mg | Freq: Four times a day (QID) | INTRAMUSCULAR | Status: DC | PRN

## 2021-05-19 MED ORDER — BISACODYL 5 MG PO TBEC: 5.0000 mg | DELAYED_RELEASE_TABLET | Freq: Every day | ORAL | Status: DC | PRN

## 2021-05-19 MED ORDER — SODIUM CHLORIDE 0.9 % WEIGHT BASED INFUSION
1.0000 mL/kg/h | INTRAVENOUS | Status: DC
  Administered 2021-05-20: 1 mL/kg/h via INTRAVENOUS

## 2021-05-19 MED ORDER — TRAZODONE HCL 50 MG PO TABS: 25.0000 mg | ORAL_TABLET | Freq: Every evening | ORAL | Status: DC | PRN

## 2021-05-19 MED ORDER — IBUPROFEN 200 MG PO TABS: 400.0000 mg | ORAL_TABLET | Freq: Four times a day (QID) | ORAL | Status: DC | PRN

## 2021-05-19 MED ORDER — FENTANYL CITRATE PF 50 MCG/ML IJ SOSY
25.0000 ug | PREFILLED_SYRINGE | INTRAMUSCULAR | Status: DC | PRN
  Administered 2021-05-20 (×2): 25 ug via INTRAVENOUS
  Filled 2021-05-19 (×4): qty 1

## 2021-05-19 MED ORDER — HEPARIN (PORCINE) 25000 UT/250ML-% IV SOLN
1150.0000 [IU]/h | INTRAVENOUS | Status: DC
Start: 2021-05-19 — End: 2021-05-20
  Administered 2021-05-19: 1000 [IU]/h via INTRAVENOUS
  Filled 2021-05-19: qty 250

## 2021-05-19 MED ORDER — CLOPIDOGREL BISULFATE 75 MG PO TABS: 75.0000 mg | ORAL_TABLET | Freq: Every day | ORAL | Status: DC

## 2021-05-19 MED ORDER — ASPIRIN 81 MG PO CHEW
81.0000 mg | CHEWABLE_TABLET | ORAL | Status: AC
  Administered 2021-05-20: 81 mg via ORAL
  Filled 2021-05-19: qty 1

## 2021-05-19 MED ORDER — PANTOPRAZOLE SODIUM 40 MG PO TBEC
40.0000 mg | DELAYED_RELEASE_TABLET | Freq: Every day | ORAL | Status: DC
  Administered 2021-05-19: 40 mg via ORAL
  Filled 2021-05-19: qty 1

## 2021-05-19 MED ORDER — SODIUM CHLORIDE 0.9 % WEIGHT BASED INFUSION
3.0000 mL/kg/h | INTRAVENOUS | Status: DC
  Administered 2021-05-20: 3 mL/kg/h via INTRAVENOUS

## 2021-05-19 NOTE — ED Notes (Signed)
Aware of need for urine sample, urinal at bedside. Call bell in reach.

## 2021-05-19 NOTE — ED Provider Notes (Signed)
[facility] EMERGENCY DEPARTMENT Provider Note   CSN: [id number] Arrival date & time: 05/19/21  1043     History Chief Complaint  Patient presents with   Nausea   Abdominal Pain    [name] is a 80 y.o. male.  This is a 80 y.o. male with significant medical history as below, including AAA, bladder cancer who presents to the ED with complaint of ongoing abdominal pain for the past month or so.  Patient reports abdominal pain is been chronic, he took the medication yesterday that he believes was an antacid.  Began to have worsening abdominal pain following taking his medication, chest pain, dyspnea, indigestion.  Pain described as aching, pressure sensation and has been ongoing since the onset.  Mildly worsened with exertion.  Ongoing indigestion sensation.  Dyspnea is also been ongoing also worse with exertion.  No home oxygen use.   The history is provided by the patient and the spouse. No language interpreter was used.  Abdominal Pain Pain location:  Generalized Pain quality: aching and cramping   Pain severity:  Mild Onset quality:  Gradual Timing:  Intermittent Progression:  Worsening Chronicity:  Recurrent Context: not alcohol use, not diet changes, not recent travel, not suspicious food intake and not trauma   Associated symptoms: chest pain, fatigue and shortness of breath   Associated symptoms: no chills, no cough, no fever, no hematuria, no nausea and no vomiting       Past Medical History:  Diagnosis Date   AAA (abdominal aortic aneurysm)    Bladder cancer (HCC)    s/p TURBT 09/26/19   Blood transfusion without reported diagnosis    Coronary artery disease    stents placed   GERD (gastroesophageal reflux disease)    HOH (hard of hearing)    Hyperlipidemia    Hypertension    Iron deficiency anemia 05/14/2018   Polio    hx of. right arm atrophy    Patient Active Problem List   Diagnosis Date Noted   NSTEMI (non-ST elevated myocardial infarction) ([place])  05/19/2021   Acute respiratory failure with hypoxia ([place]) 05/19/2021   GERD (gastroesophageal reflux disease)    HOH (hard of hearing)    Elevated liver enzymes    Abdominal aortic aneurysm (AAA) 10/14/2019   Abdominal aortic aneurysm (AAA) greater than 5.5 cm in diameter in male 10/14/2019   Hematuria 09/06/2019   Bladder mass 09/06/2019   AAA (abdominal aortic aneurysm) 09/06/2019   S/P arterial stent 09/06/2019   Acute blood loss anemia 08/08/2019   Iron deficiency anemia due to chronic blood loss 08/08/2019   Thrombocytosis 08/08/2019   Symptomatic anemia 08/07/2019   Coronary artery disease 08/07/2019   Absolute anemia 05/15/2018   Guaiac positive stools 05/15/2018   Iron deficiency anemia 05/14/2018   Leukocytosis 10/09/2017   Murmur 05/15/2014   Polio 05/15/2014   HTN (hypertension) 11/23/2010   CLAUDICATION 05/19/2009   Elevated lipids 09/17/2008   CORONARY ARTERY ANEURYSM 09/17/2008    Past Surgical History:  Procedure Laterality Date   ABDOMINAL AORTIC ANEURYSM REPAIR N/A 10/14/2019   Procedure: OPEN ABDOMINAL AORTIC ANEURYSM REPAIR USING HEMASHIELD GOLD 28mm x 30cm GRAFT;  Surgeon: Angelia Mould, MD;  Location: [facility];  Service: Vascular;  Laterality: N/A;   CARDIAC CATHETERIZATION  07-09-2007   CHOLECYSTECTOMY     COLONOSCOPY WITH PROPOFOL N/A 05/31/2018   Procedure: COLONOSCOPY WITH PROPOFOL;  Surgeon: Rogene Houston, MD;  [facility];  Service: Endoscopy;  Laterality: N/A;  1:45   CORONARY STENT PLACEMENT     x3 vessels bifurcated single vessel   CYSTOSCOPY W/ RETROGRADES Bilateral 09/26/2019   Procedure: CYSTOSCOPY WITH RETROGRADE PYELOGRAM;  Surgeon: Alexis Frock, MD;  Location: [facility];  Service: Urology;  Laterality: Bilateral;   ESOPHAGOGASTRODUODENOSCOPY (EGD) WITH PROPOFOL N/A 05/31/2018   Procedure: ESOPHAGOGASTRODUODENOSCOPY (EGD) WITH PROPOFOL;  Surgeon: Rogene Houston, MD;  [facility];  Service: Endoscopy;   Laterality: N/A;   ESOPHAGOGASTRODUODENOSCOPY (EGD) WITH PROPOFOL N/A 08/08/2019   Procedure: ESOPHAGOGASTRODUODENOSCOPY (EGD) WITH PROPOFOL;  Surgeon: Rogene Houston, MD;  [facility];  Service: Endoscopy;  Laterality: N/A;   KNEE SURGERY     Left knee  steel rod   POLYPECTOMY  05/31/2018   Procedure: POLYPECTOMY;  Surgeon: Rogene Houston, MD;  [facility];  Service: Endoscopy;;  sigmoid   TRANSURETHRAL RESECTION OF BLADDER TUMOR N/A 09/26/2019   Procedure: TRANSURETHRAL RESECTION OF BLADDER TUMOR (TURBT);  Surgeon: Alexis Frock, MD;  Location: [facility];  Service: Urology;  Laterality: N/A;  1 HR       Family History  Problem Relation Age of Onset   Heart attack Brother 42       died   Hypertension Mother    Heart attack Father     Social History   Tobacco Use   Smoking status: Former    Packs/day: 1.00    Years: 15.00    Pack years: 15.00    Types: Cigarettes    Quit date: 06/12/2004    Years since quitting: 16.9   Smokeless tobacco: Never  Vaping Use   Vaping Use: Never used  Substance Use Topics   Alcohol use: No    Alcohol/week: 0.0 standard drinks   Drug use: No    Home Medications Prior to Admission medications   Medication Sig Start Date End Date Taking? Authorizing Provider  aspirin EC 81 MG tablet Take 81 mg by mouth daily.   Yes [provider]  atorvastatin (LIPITOR) 40 MG tablet Take 1 tablet by mouth once daily 04/25/21  Yes Josue Hector, MD  clopidogrel (PLAVIX) 75 MG tablet Take 1 tablet by mouth once daily 11/26/20  Yes Josue Hector, MD  ELDERBERRY PO Take 1 tablet by mouth daily.    Yes [provider]  ondansetron (ZOFRAN) 4 MG tablet Take 8 mg by mouth 2 (two) times daily. 05/16/21  Yes [provider]  pantoprazole (PROTONIX) 40 MG tablet Take 1 tablet (40 mg total) by mouth daily. Take 30 min before meals 09/07/19  Yes Aline August, MD  Probiotic Product (PROBIOTIC PO) Take 1 tablet by  mouth daily.   Yes [provider]  ferrous sulfate 325 (65 FE) MG tablet Take 1 tablet (325 mg total) by mouth 2 (two) times daily with a meal. Patient not taking: Reported on 05/19/2021 08/08/19   Orson Eva, MD  nitroGLYCERIN (NITROSTAT) 0.4 MG SL tablet Place 1 tablet (0.4 mg total) under the tongue every 5 (five) minutes x 3 doses as needed for chest pain. 05/04/20   Josue Hector, MD    Allergies    Tramadol  Review of Systems   Review of Systems  Constitutional:  Positive for fatigue. Negative for chills and fever.  HENT:  Negative for facial swelling and trouble swallowing.   Eyes:  Negative for photophobia and visual disturbance.  Respiratory:  Positive for shortness of breath. Negative for cough.   Cardiovascular:  Positive for chest pain.  Negative for palpitations.  Gastrointestinal:  Positive for abdominal pain. Negative for nausea and vomiting.  Endocrine: Negative for polydipsia and polyuria.  Genitourinary:  Negative for difficulty urinating and hematuria.  Musculoskeletal:  Negative for gait problem and joint swelling.  Skin:  Negative for pallor and rash.  Neurological:  Negative for syncope and headaches.  Psychiatric/Behavioral:  Negative for agitation and confusion.    Physical Exam Updated Vital Signs BP 123/66   Pulse 80   Temp 98.3 F (36.8 C) (Oral)   Resp (!) 25   Ht 5\' 6"  (1.676 m)   Wt 88.9 kg Comment: weighed himself at home yesterday, consistent with MD office weight monday  SpO2 96%   BMI 31.64 kg/m   Physical Exam Vitals and nursing note reviewed.  Constitutional:      General: He is not in acute distress.    Appearance: He is well-developed.  HENT:     Head: Normocephalic and atraumatic.     Right Ear: External ear normal.     Left Ear: External ear normal.     Mouth/Throat:     Mouth: Mucous membranes are moist.  Eyes:     General: No scleral icterus. Cardiovascular:     Rate and Rhythm: Normal rate and regular rhythm.      Pulses: Normal pulses.     Heart sounds: Murmur heard.  Systolic murmur is present.  Pulmonary:     Effort: Pulmonary effort is normal. No respiratory distress.     Breath sounds: Normal breath sounds.  Abdominal:     General: Abdomen is flat.     Palpations: Abdomen is soft.     Tenderness: There is no abdominal tenderness. There is no right CVA tenderness, left CVA tenderness, guarding or rebound.     Comments: No appreciable pulsatile abdominal mass, no abdominal bruit  Musculoskeletal:        General: Normal range of motion.     Cervical back: Normal range of motion.     Right lower leg: No edema.     Left lower leg: No edema.  Skin:    General: Skin is warm and dry.     Capillary Refill: Capillary refill takes less than 2 seconds.  Neurological:     Mental Status: He is alert and oriented to person, place, and time.  Psychiatric:        Mood and Affect: Mood normal.        Behavior: Behavior normal.    ED Results / Procedures / Treatments   Labs (all labs ordered are listed, but only abnormal results are displayed) Labs Reviewed  CBC WITH DIFFERENTIAL/PLATELET - Abnormal; Notable for the following components:      Result Value   WBC 14.6 (*)    Hemoglobin 10.3 (*)    MCH 21.4 (*)    MCHC 26.0 (*)    RDW 17.7 (*)    nRBC 1.1 (*)    Neutro Abs 11.3 (*)    Monocytes Absolute 1.8 (*)    Abs Immature Granulocytes 0.15 (*)    All other components within normal limits  COMPREHENSIVE METABOLIC PANEL - Abnormal; Notable for the following components:   Chloride 95 (*)    Glucose, Bld 156 (*)    BUN 29 (*)    AST 303 (*)    ALT 272 (*)    All other components within normal limits  TROPONIN I (HIGH SENSITIVITY) - Abnormal; Notable for the following components:   Troponin I (High  Sensitivity) 1,033 (*)    All other components within normal limits  TROPONIN I (HIGH SENSITIVITY) - Abnormal; Notable for the following components:   Troponin I (High Sensitivity) 1,452 (*)     All other components within normal limits  RESP PANEL BY RT-PCR (FLU A&B, COVID) ARPGX2  LIPASE, BLOOD  LACTIC ACID, PLASMA  TSH  URINALYSIS, ROUTINE W REFLEX MICROSCOPIC  HEPATITIS PANEL, ACUTE  HEPARIN LEVEL (UNFRACTIONATED)  HEMOGLOBIN A1C  VITAMIN D 25 HYDROXY (VIT D DEFICIENCY, FRACTURES)    EKG EKG Interpretation  Date/Time:  Thursday May 19 2021 12:40:20 EST Ventricular Rate:  75 PR Interval:  177 QRS Duration: 141 QT Interval:  406 QTC Calculation: 211 R Axis:   77 Text Interpretation: Sinus rhythm Right bundle branch block Inferior infarct, old Similar to prior Confirmed by Wynona Dove ([id number]) on 05/19/2021 2:37:02 PM  Radiology DG Chest 1 View  Result Date: 05/19/2021 CLINICAL DATA:  Shortness of breath. EXAM: CHEST  1 VIEW COMPARISON:  Chest x-ray from same day at 1222 hours. FINDINGS: Stable cardiomegaly. Unchanged bibasilar atelectasis/scarring. Linear density overlying the peripheral right lung corresponds to a prominent lung marking on CT from same day. No focal consolidation, pleural effusion, or pneumothorax. No acute osseous abnormality. IMPRESSION: 1. Linear density overlying the peripheral right lung corresponds to a prominent lung marking. No pneumothorax. Electronically Signed   By: Titus Dubin M.D.   On: 05/19/2021 14:41   CT ABDOMEN PELVIS W CONTRAST  Result Date: 05/19/2021 CLINICAL DATA:  Acute generalized abdominal pain. EXAM: CT ABDOMEN AND PELVIS WITH CONTRAST TECHNIQUE: Multidetector CT imaging of the abdomen and pelvis was performed using the standard protocol following bolus administration of intravenous contrast. CONTRAST:  129mL OMNIPAQUE IOHEXOL 300 MG/ML  SOLN COMPARISON:  September 23, 2019. FINDINGS: Lower chest: Mild bibasilar subsegmental atelectasis is noted. Hepatobiliary: No focal liver abnormality is seen. Status post cholecystectomy. No biliary dilatation. Pancreas: Unremarkable. No pancreatic ductal dilatation or surrounding inflammatory  changes. Spleen: Normal in size without focal abnormality. Adrenals/Urinary Tract: Adrenal glands appear normal. Right renal cyst is noted. No hydronephrosis or renal obstruction is noted. No renal or ureteral calculi are noted. Urinary bladder is unremarkable. Stomach/Bowel: Stomach is within normal limits. Appendix appears normal. No evidence of bowel wall thickening, distention, or inflammatory changes. Vascular/Lymphatic: Status post surgical repair of abdominal aorta. No adenopathy is noted. Reproductive: Prostate is unremarkable. Other: No abdominal wall hernia or abnormality. No abdominopelvic ascites. Musculoskeletal: Minimal grade 1 anterolisthesis is noted secondary to bilateral L5 spondylolysis. IMPRESSION: No acute abnormality seen in the abdomen or pelvis. Aortic Atherosclerosis (ICD10-I70.0). Electronically Signed   By: Marijo Conception M.D.   On: 05/19/2021 13:33   DG Chest Portable 1 View  Result Date: 05/19/2021 CLINICAL DATA:  Shortness of breath EXAM: PORTABLE CHEST 1 VIEW COMPARISON:  Previous studies including the examination of 10/15/2019 FINDINGS: Transverse diameter of heart is increased. There are no signs of pulmonary edema. There are linear densities in medial left lower lung fields. Left lateral CP angle is indistinct. There is no definite pneumothorax. Faint linear density seen in the lateral aspect of right lower lung fields may suggest pleural thickening. Less likely possibility would be loculated right pneumothorax. IMPRESSION: Cardiomegaly. Increased markings in the medial lower lung fields may suggest scarring or subsegmental atelectasis. There are no new focal infiltrates. There is faint linear radiopacity in the lateral aspect of right lower lung fields which may be due to pleural thickening or suggest small loculated right pneumothorax. There is  no demonstrable right apical pneumothorax. Repeat PA views of chest in the inspiration and expiration may be considered.  Electronically Signed   By: Elmer Picker M.D.   On: 05/19/2021 12:42    Procedures .Critical Care Performed by: Jeanell Sparrow, DO Authorized by: Jeanell Sparrow, DO   Critical care provider statement:    Critical care time (minutes):  44   Critical care time was exclusive of:  Separately billable procedures and treating other patients   Critical care was necessary to treat or prevent imminent or life-threatening deterioration of the following conditions:  Cardiac failure   Critical care was time spent personally by me on the following activities:  Development of treatment plan with patient or surrogate, discussions with consultants, evaluation of patient's response to treatment, examination of patient, ordering and review of laboratory studies, ordering and review of radiographic studies, ordering and performing treatments and interventions, pulse oximetry, re-evaluation of patient's condition and review of old charts   Care discussed with: admitting provider     Medications Ordered in ED Medications  aspirin tablet 325 mg (325 mg Oral Given 05/19/21 1414)  heparin ADULT infusion 100 units/mL (25000 units/281mL) (1,000 Units/hr Intravenous New Bag/Given 05/19/21 1439)  nitroGLYCERIN (NITROSTAT) SL tablet 0.4 mg (has no administration in time range)  pantoprazole (PROTONIX) EC tablet 40 mg (40 mg Oral Given 05/19/21 1436)  ibuprofen (ADVIL) tablet 400 mg (has no administration in time range)  fentaNYL (SUBLIMAZE) injection 25 mcg (has no administration in time range)  traZODone (DESYREL) tablet 25 mg (has no administration in time range)  bisacodyl (DULCOLAX) EC tablet 5 mg (has no administration in time range)  ondansetron (ZOFRAN) tablet 4 mg (has no administration in time range)    Or  ondansetron (ZOFRAN) injection 4 mg (has no administration in time range)  insulin aspart (novoLOG) injection 0-9 Units (has no administration in time range)  iohexol (OMNIPAQUE) 300 MG/ML solution  100 mL (100 mLs Intravenous Contrast Given 05/19/21 1301)  heparin bolus via infusion 4,000 Units (4,000 Units Intravenous Bolus from Bag 05/19/21 1439)    ED Course  I have reviewed the triage vital signs and the nursing notes.  Pertinent labs & imaging results that were available during my care of the patient were reviewed by me and considered in my medical decision making (see chart for details).    MDM Rules/Calculators/A&P                           CC: Abdominal pain, chest pain, dyspnea  This patient complains of above; this involves an extensive number of treatment options and is a complaint that carries with it a high risk of complications and morbidity. Vital signs were reviewed. Serious etiologies considered.  Record review:  Previous records obtained and reviewed   Additional history obtained from spouse  Work up as above, notable for:  Labs & imaging results that were available during my care of the patient were reviewed by me and considered in my medical decision making.   I ordered imaging studies which included CXR, CTAP and I independently visualized and interpreted imaging which showed no acute abnormality CT.  Chest x-ray with possible likely atelectasis, possibly loculated right side pneumothorax.  CXR abnormality, favor atelectasis, very low suspicion for ptx.  Patient with significant elevation to troponin, ongoing chest pain, dyspnea.  Hypoxia on arrival but improved with 2 L nasal cannula.  EKG without evidence acute ischemia.  Discussed with  cardiology on-call who recommends transfer to [facility] for possible heart cath.  Recommend starting heparin, aspirin. They will notify cards team at [facility]   Patient with transaminitis, no clear source as evidenced on CT PE will obtain hepatitis screen.  No evidence of cholelithiasis on CT. No heavy etoh use. Hx cholecystectomy.      D/w with [facility] who accepts patient for admission.    This chart was dictated using voice  recognition software.  Despite best efforts to proofread,  errors can occur which can change the documentation meaning.  Final Clinical Impression(s) / ED Diagnoses Final diagnoses:  NSTEMI (non-ST elevated myocardial infarction) ([place])  Hypoxia  Transaminitis    Rx / DC Orders ED Discharge Orders     None        Jeanell Sparrow, DO 05/19/21 1611

## 2021-05-19 NOTE — ED Notes (Signed)
Returned from CT.

## 2021-05-19 NOTE — ED Notes (Signed)
Portable xray at bedside.

## 2021-05-19 NOTE — ED Notes (Signed)
Transported with [facility]

## 2021-05-19 NOTE — ED Notes (Signed)
Transported to CT on monitor 

## 2021-05-19 NOTE — Progress Notes (Signed)
Dr. Blossom Hoops notified about pt's troponin of 1507. Pt is currently sleeping and asymptomatic at this time no complaints of pain. Will continue to monitor pt very closely for any changes.

## 2021-05-19 NOTE — Progress Notes (Signed)
ANTICOAGULATION CONSULT NOTE - Initial Consult  Pharmacy Consult for heparin Indication: chest pain/ACS  Allergies  Allergen Reactions   Tramadol Other (See Comments)    Excessive sleepiness/difficulty waking patient    Patient Measurements: Height: 5\' 6"  (167.6 cm) Weight: 88.9 kg (196 lb) (weighed himself at home yesterday, consistent with MD office weight monday) IBW/kg (Calculated) : 63.8 Heparin Dosing Weight: 83 kg  Vital Signs: Temp: 98.3 F (36.8 C) (12/08 1121) Temp Source: Oral (12/08 1121) BP: 123/68 (12/08 1335) Pulse Rate: 78 (12/08 1335)  Labs: Recent Labs    05/19/21 1131 05/19/21 1151  HGB  --  10.3*  HCT  --  39.6  PLT  --  362  CREATININE  --  1.13  TROPONINIHS 1,033*  --     Estimated Creatinine Clearance: 54.4 mL/min (by C-G formula based on SCr of 1.13 mg/dL).   Medical History: Past Medical History:  Diagnosis Date   AAA (abdominal aortic aneurysm)    Bladder cancer ([place])    s/p TURBT 09/26/19   Blood transfusion without reported diagnosis    Coronary artery disease    stents placed   GERD (gastroesophageal reflux disease)    HOH (hard of hearing)    Hyperlipidemia    Hypertension    Iron deficiency anemia 05/14/2018   Polio    hx of. right arm atrophy    Medications:  (Not in a hospital admission)   Assessment: Pharmacy consulted to dose heparin in patient with chest pain/ACS.  Patient is not on anticoagulation prior to admission.    Hgb 10.3 Trop 1,033  Goal of Therapy:  Heparin level 0.3-0.7 units/ml Monitor platelets by anticoagulation protocol: Yes   Plan:  Give 4000 units bolus x 1 Start heparin infusion at 1000 units/hr Check anti-Xa level in 8 hours and daily while on heparin Continue to monitor H&H and platelets  Margot Ables, PharmD Clinical Pharmacist 05/19/2021 2:18 PM

## 2021-05-19 NOTE — ED Notes (Signed)
Wife updated concerning patient's transfer to [facility].

## 2021-05-19 NOTE — ED Notes (Addendum)
CRITICAL VALUE STICKER  CRITICAL VALUE: troponin I (high sens): 1033ng/L  MD NOTIFIED: Dr. Pearline Cables  TIME OF NOTIFICATION: 5379  RESPONSE: MD at bedside, EKG obtained and given to Dr. Pearline Cables

## 2021-05-19 NOTE — ED Triage Notes (Signed)
Nausea with vomiting, abdominal pain for months, history of abdominal aneurysm

## 2021-05-19 NOTE — Progress Notes (Signed)
[place] for heparin Indication: chest pain/ACS  Allergies  Allergen Reactions   Tramadol Other (See Comments)    Excessive sleepiness/difficulty waking patient    Patient Measurements: Height: 5\' 6"  (167.6 cm) Weight: 88.9 kg (196 lb) (weighed himself at home yesterday, consistent with MD office weight monday) IBW/kg (Calculated) : 63.8 Heparin Dosing Weight: 83 kg  Vital Signs: Temp: 99 F (37.2 C) (12/08 2045) Temp Source: Oral (12/08 2045) BP: 121/63 (12/08 2045) Pulse Rate: 74 (12/08 2045)  Labs: Recent Labs    05/19/21 1131 05/19/21 1151 05/19/21 1403 05/19/21 1840  HGB  --  10.3*  --   --   HCT  --  39.6  --   --   PLT  --  362  --   --   CREATININE  --  1.13  --   --   TROPONINIHS 1,033*  --  1,452* 1,507*     Estimated Creatinine Clearance: 54.4 mL/min (by C-G formula based on SCr of 1.13 mg/dL).   Assessment: Pharmacy consulted to dose heparin in patient with chest pain/ACS.  Patient is not on anticoagulation prior to admission.    Heparin level subtherapeutic (0.2) on gtt at 1000 units/hr. No issues with line or bleeding reported per RN.   Goal of Therapy:  Heparin level 0.3-0.7 units/ml Monitor platelets by anticoagulation protocol: Yes   Plan:  Increase heparin infusion to 1150 units/hr Will f/u 8 hr heparin level  Sherlon Handing, PharmD, BCPS Please see amion for complete clinical pharmacist phone list 05/19/2021 11:41 PM

## 2021-05-19 NOTE — H&P (View-Only) (Signed)
Cardiology Consult Note:   Patient ID: [name] MRN: [id number]; DOB: 17-Dec-1940   Admission date: 05/19/2021  PCP:  Benjamin Squibb, MD   [place] Prov[facility] Cardiologist:  Benjamin Rouge, MD        Chief Complaint:  Angina and NSTEMI  Patient Profile:   [name] is a 80 y.o. male with CAD with distant LAD PCI in 2009, HTN, and HLD, hx of AAA s/p open repair 2021, hx of Mild AS mean gradients 20s and DVI 0.45  who is being seen 05/19/2021 for the evaluation of angina.  History of Present Illness:   [name]  notes that he is feeling pooly.  For the last week he has had chest pain that he attributes to indigestion.  His wife has been worried about it.  Discomfort occurs at rest.  Chest pain worsened PM of 05/18/21; notes that [name] has been considering repeating testing for this.  Pain became bad enough that he came to the hospital this AM.  Complicated by nausea.  Wife notes that he has also been having abdominal pain recently with lost of appetite.  It is unclear if this is related to the indigestion pain.  Notes that he is always tired.  Has had more fatigue. No syncope or near syncope.  Inpatient evaluation because of worsening pain.  Notes  no palpitations or funny heart beats.     In the ED patient received medications ASA and nitroglycerin with improvement of the pain.   Key labs notable for troponin of 1000+ .    Cardiology called for evaluation.  In interval of this, a CT was performed showing no evidence of AAA repair issues.  Heparin was started.  At time of evaluation , sent lactic acid that was WNL.  Troponin increase to 1400.  Patient has a prior Echo with normal LV function mild-mod AS, and no WMAs.   Past Medical History:  Diagnosis Date   AAA (abdominal aortic aneurysm)    Bladder cancer ([place])    s/p TURBT 09/26/19   Blood transfusion without reported diagnosis    Coronary artery disease    stents placed   GERD (gastroesophageal reflux  disease)    HOH (hard of hearing)    Hyperlipidemia    Hypertension    Iron deficiency anemia 05/14/2018   Polio    hx of. right arm atrophy    Past Surgical History:  Procedure Laterality Date   ABDOMINAL AORTIC ANEURYSM REPAIR N/A 10/14/2019   Procedure: OPEN ABDOMINAL AORTIC ANEURYSM REPAIR USING HEMASHIELD GOLD 27mm x 30cm GRAFT;  Surgeon: Angelia Mould, MD;  Location: [facility];  Service: Vascular;  Laterality: N/A;   CARDIAC CATHETERIZATION  07-09-2007   CHOLECYSTECTOMY     COLONOSCOPY WITH PROPOFOL N/A 05/31/2018   Procedure: COLONOSCOPY WITH PROPOFOL;  Surgeon: Rogene Houston, MD;  [facility];  Service: Endoscopy;  Laterality: N/A;  1:45   CORONARY STENT PLACEMENT     x3 vessels bifurcated single vessel   CYSTOSCOPY W/ RETROGRADES Bilateral 09/26/2019   Procedure: CYSTOSCOPY WITH RETROGRADE PYELOGRAM;  Surgeon: Alexis Frock, MD;  Location: [facility];  Service: Urology;  Laterality: Bilateral;   ESOPHAGOGASTRODUODENOSCOPY (EGD) WITH PROPOFOL N/A 05/31/2018   Procedure: ESOPHAGOGASTRODUODENOSCOPY (EGD) WITH PROPOFOL;  Surgeon: Rogene Houston, MD;  [facility];  Service: Endoscopy;  Laterality: N/A;   ESOPHAGOGASTRODUODENOSCOPY (EGD) WITH PROPOFOL N/A 08/08/2019   Procedure: ESOPHAGOGASTRODUODENOSCOPY (EGD) WITH PROPOFOL;  Surgeon: Rogene Houston, MD;  [facility];  Service: Endoscopy;  Laterality: N/A;   KNEE SURGERY     Left knee  steel rod   POLYPECTOMY  05/31/2018   Procedure: POLYPECTOMY;  Surgeon: Rogene Houston, MD;  [facility];  Service: Endoscopy;;  sigmoid   TRANSURETHRAL RESECTION OF BLADDER TUMOR N/A 09/26/2019   Procedure: TRANSURETHRAL RESECTION OF BLADDER TUMOR (TURBT);  Surgeon: Alexis Frock, MD;  Location: [facility];  Service: Urology;  Laterality: N/A;  1 HR     Medications Prior to Admission: Prior to Admission medications   Medication Sig Start Date End Date Taking? Authorizing Provider  aspirin EC 81  MG tablet Take 81 mg by mouth daily.   Yes [provider]  atorvastatin (LIPITOR) 40 MG tablet Take 1 tablet by mouth once daily 04/25/21  Yes Josue Hector, MD  clopidogrel (PLAVIX) 75 MG tablet Take 1 tablet by mouth once daily 11/26/20  Yes Josue Hector, MD  ELDERBERRY PO Take 1 tablet by mouth daily.    Yes [provider]  ondansetron (ZOFRAN) 4 MG tablet Take 8 mg by mouth 2 (two) times daily. 05/16/21  Yes [provider]  pantoprazole (PROTONIX) 40 MG tablet Take 1 tablet (40 mg total) by mouth daily. Take 30 min before meals 09/07/19  Yes Aline August, MD  Probiotic Product (PROBIOTIC PO) Take 1 tablet by mouth daily.   Yes [provider]  ferrous sulfate 325 (65 FE) MG tablet Take 1 tablet (325 mg total) by mouth 2 (two) times daily with a meal. Patient not taking: Reported on 05/19/2021 08/08/19   Orson Eva, MD  nitroGLYCERIN (NITROSTAT) 0.4 MG SL tablet Place 1 tablet (0.4 mg total) under the tongue every 5 (five) minutes x 3 doses as needed for chest pain. 05/04/20   Josue Hector, MD     Allergies:    Allergies  Allergen Reactions   Tramadol Other (See Comments)    Excessive sleepiness/difficulty waking patient    Social History:   Social History   Socioeconomic History   Marital status: Married    Spouse name: Not on file   Number of children: 2   Years of education: Not on file   Highest education level: Not on file  Occupational History   Occupation: retired Furniture conservator/restorer  Tobacco Use   Smoking status: Former    Packs/day: 1.00    Years: 15.00    Pack years: 15.00    Types: Cigarettes    Quit date: 06/12/2004    Years since quitting: 16.9   Smokeless tobacco: Never  Vaping Use   Vaping Use: Never used  Substance and Sexual Activity   Alcohol use: No    Alcohol/week: 0.0 standard drinks   Drug use: No   Sexual activity: Not Currently    Birth control/protection: None  Other Topics Concern   Not on file  Social  History Narrative   ** Merged History Encounter **       Social Determinants of Health   Financial Resource Strain: Not on file  Food Insecurity: Not on file  Transportation Needs: Not on file  Physical Activity: Not on file  Stress: Not on file  Social Connections: Not on file  Intimate Partner Violence: Not on file    Family History:   The patient's family history includes Heart attack in his father; Heart attack (age of onset: 55) in his brother; Hypertension in his mother.    ROS:  Please see the  history of present illness.  All other ROS reviewed and negative.     Physical Exam/Data:   Vitals:   05/19/21 1335 05/19/21 1400 05/19/21 1412 05/19/21 1445  BP: 123/68 135/77  123/66  Pulse: 78 74  80  Resp: (!) 25 (!) 28  (!) 25  Temp:      TempSrc:      SpO2: 100% 99%  96%  Weight:   88.9 kg   Height:   5\' 6"  (1.676 m)    No intake or output data in the 24 hours ending 05/19/21 1517 Last 3 Weights 05/19/2021 10/26/2020 07/07/2020  Weight (lbs) 196 lb 197 lb 190 lb  Weight (kg) 88.905 kg 89.359 kg 86.183 kg     Body mass index is 31.64 kg/m.    Gen: mild distress elderly male  Neck: No JVD Cardiac: No Rubs or Gallops, AS Murmur, regular rate, +2 radial pulses Respiratory: Clear to auscultation bilaterally, normal effort, normal  respiratory rate GI: Soft, nontender, non-distended  MS: Non pitting bilateral edema; both legs are weak Integument: Skin feels cool Neuro:  At time of evaluation, alert and oriented to person/place/time/situation  Psych: Normal affect, patient feels fatigued   EKG:  The ECG that was done  was personally reviewed and demonstrates SR with RBBB inferior infarct pattern and ST depressions in the septal leads; similar to 10/26/20   Laboratory Data:  High Sensitivity Troponin:   Recent Labs  Lab 05/19/21 1131 05/19/21 1403  TROPONINIHS 1,033* 1,452*      Chemistry Recent Labs  Lab 05/19/21 1151  NA 136  K 4.5  CL 95*  CO2 32   GLUCOSE 156*  BUN 29*  CREATININE 1.13  CALCIUM 9.1  GFRNONAA >60  ANIONGAP 9    Recent Labs  Lab 05/19/21 1151  PROT 8.0  ALBUMIN 4.2  AST 303*  ALT 272*  ALKPHOS 81  BILITOT 0.7   Lipids No results for input(s): CHOL, TRIG, HDL, LABVLDL, LDLCALC, CHOLHDL in the last 168 hours. Hematology Recent Labs  Lab 05/19/21 1151  WBC 14.6*  RBC 4.81  HGB 10.3*  HCT 39.6  MCV 82.3  MCH 21.4*  MCHC 26.0*  RDW 17.7*  PLT 362   Thyroid  Recent Labs  Lab 05/19/21 1401  TSH 0.762   BNPNo results for input(s): BNP, PROBNP in the last 168 hours.  DDimer No results for input(s): DDIMER in the last 168 hours.   Radiology/Studies:  DG Chest 1 View  Result Date: 05/19/2021 CLINICAL DATA:  Shortness of breath. EXAM: CHEST  1 VIEW COMPARISON:  Chest x-ray from same day at 1222 hours. FINDINGS: Stable cardiomegaly. Unchanged bibasilar atelectasis/scarring. Linear density overlying the peripheral right lung corresponds to a prominent lung marking on CT from same day. No focal consolidation, pleural effusion, or pneumothorax. No acute osseous abnormality. IMPRESSION: 1. Linear density overlying the peripheral right lung corresponds to a prominent lung marking. No pneumothorax. Electronically Signed   By: Titus Dubin M.D.   On: 05/19/2021 14:41   CT ABDOMEN PELVIS W CONTRAST  Result Date: 05/19/2021 CLINICAL DATA:  Acute generalized abdominal pain. EXAM: CT ABDOMEN AND PELVIS WITH CONTRAST TECHNIQUE: Multidetector CT imaging of the abdomen and pelvis was performed using the standard protocol following bolus administration of intravenous contrast. CONTRAST:  141mL OMNIPAQUE IOHEXOL 300 MG/ML  SOLN COMPARISON:  September 23, 2019. FINDINGS: Lower chest: Mild bibasilar subsegmental atelectasis is noted. Hepatobiliary: No focal liver abnormality is seen. Status post cholecystectomy. No biliary dilatation. Pancreas: Unremarkable.  No pancreatic ductal dilatation or surrounding inflammatory changes.  Spleen: Normal in size without focal abnormality. Adrenals/Urinary Tract: Adrenal glands appear normal. Right renal cyst is noted. No hydronephrosis or renal obstruction is noted. No renal or ureteral calculi are noted. Urinary bladder is unremarkable. Stomach/Bowel: Stomach is within normal limits. Appendix appears normal. No evidence of bowel wall thickening, distention, or inflammatory changes. Vascular/Lymphatic: Status post surgical repair of abdominal aorta. No adenopathy is noted. Reproductive: Prostate is unremarkable. Other: No abdominal wall hernia or abnormality. No abdominopelvic ascites. Musculoskeletal: Minimal grade 1 anterolisthesis is noted secondary to bilateral L5 spondylolysis. IMPRESSION: No acute abnormality seen in the abdomen or pelvis. Aortic Atherosclerosis (ICD10-I70.0). Electronically Signed   By: Marijo Conception M.D.   On: 05/19/2021 13:33   DG Chest Portable 1 View  Result Date: 05/19/2021 CLINICAL DATA:  Shortness of breath EXAM: PORTABLE CHEST 1 VIEW COMPARISON:  Previous studies including the examination of 10/15/2019 FINDINGS: Transverse diameter of heart is increased. There are no signs of pulmonary edema. There are linear densities in medial left lower lung fields. Left lateral CP angle is indistinct. There is no definite pneumothorax. Faint linear density seen in the lateral aspect of right lower lung fields may suggest pleural thickening. Less likely possibility would be loculated right pneumothorax. IMPRESSION: Cardiomegaly. Increased markings in the medial lower lung fields may suggest scarring or subsegmental atelectasis. There are no new focal infiltrates. There is faint linear radiopacity in the lateral aspect of right lower lung fields which may be due to pleural thickening or suggest small loculated right pneumothorax. There is no demonstrable right apical pneumothorax. Repeat PA views of chest in the inspiration and expiration may be considered. Electronically Signed    By: Elmer Picker M.D.   On: 05/19/2021 12:42     Coronary Artery Disease presenting with NSTEMI HTN, and HLD Hx of AAA s/p open repair 2021 hx of Mild-mod AS mean gradients 20s and DVI 8.88 Complicated by transaminitis and nausea that does not appear to be shock related - symptomatic  - discussed with ED provider: ASA load, heparin load - statin held in the setting of LFTs -  PPI - a portion of his abdominal sx are related to NSTEMI, there is a non cardiac portion that our [facility] colleagues are evaluating  - recommend for transfer to [facility] for consultation and potential LHC; I do not have any contraindication for this  We will continue to follow bother here and at [facility]  We have discussed [place]; patient and wife are amenable. Has bed assignment in [place].   Risk Assessment/Risk Scores:    TIMI Risk Score for Unstable Angina or Non-ST Elevation MI:   The patient's TIMI risk score is 5, which indicates a 26% risk of all cause mortality, new or recurrent myocardial infarction or need for urgent revascularization in the next 14 days.   For questions or updates, please contact [place] Please consult [email or web address] for contact info under     Signed, Werner Lean, MD  05/19/2021 3:17 PM

## 2021-05-19 NOTE — H&P (Addendum)
History and Physical  [facility]  [name] EGB:[id number] DOB: Jun 04, 1941 DOA: 05/19/2021  PCP: Celene Squibb, MD  Patient coming from: Home  Level of care: Telemetry Cardiac  I have personally briefly reviewed patient's old medical records in [place]  Chief Complaint: chest pain   HPI: [name] is a 80 y.o. male with medical history significant for AAA, CAD s/p remote LAD stent placement, HOH, bladder cancer s/p TURBT, chronic leukocytosis, IDA, polio with residual right arm atrophy, hyperlipidemia, HTN, GERD who presented to the ED with multiple complaints including nausea and vomiting and abdominal pain that has been ongoing for past month. He has been constipated for 2 weeks.  He reportedly took an antacid yesterday and subsequently started having worsening abdominal pain, chest pain, shortness of breath and indigestion.  Wife reports that he has been increasingly more fatigued with reduced exercise tolerance for last 2 weeks.  He had mentioned to his wife that he "thought he had another blockage."  He has had bilious emesis at home over last 2 days and worsening GERD symptoms.  He now has central chest pressure and aching pain that is worse with exertion.  He is more SOB with exertion.  No diarrhea.  Chest pain has also been cramping and abdominal pain has been aching.   ED Course: Pt was hypoxic on arrival to ED and placed initially on 6L/min, his pulse ox had dropped to 79%, he stabilized at 4L/min with improvement of pulse ox to 98%.  His labs were stable except markedly elevated LFTs with ALT of 272 and AST of 303. His WBC was 14.6. Hg was 10.3.  His HS troponin was 1033, followed by 1452.  His EKG showed RBBB.  His chest x-ray was without acute finding.  His lactic acid was 1.5.  He had a normal CT abdomen and pelvis.  His lipase was 32 and his total bilirubin was 0.7.  His influenza A and B testing was negative.  His SARS 2 coronavirus test was negative.   Cardiology was consulted Dr. Gasper Sells recommended admission to [facility] and IV heparin infusion and aspirin with likely cardiac cath in AM.    Review of Systems: Review of Systems  Constitutional:  Positive for diaphoresis and malaise/fatigue.  HENT:  Positive for congestion and hearing loss.   Eyes: Negative.   Respiratory:  Positive for shortness of breath.   Cardiovascular:  Positive for chest pain. Negative for palpitations.  Gastrointestinal:  Positive for abdominal pain, constipation, heartburn, nausea and vomiting.  Genitourinary: Negative.   Musculoskeletal: Negative.   Skin:  Negative for itching and rash.  Neurological: Negative.   Endo/Heme/Allergies: Negative.   Psychiatric/Behavioral: Negative.    All other systems reviewed and are negative.   Past Medical History:  Diagnosis Date   AAA (abdominal aortic aneurysm)    Bladder cancer ([place])    s/p TURBT 09/26/19   Blood transfusion without reported diagnosis    Coronary artery disease    stents placed   GERD (gastroesophageal reflux disease)    HOH (hard of hearing)    Hyperlipidemia    Hypertension    Iron deficiency anemia 05/14/2018   Polio    hx of. right arm atrophy    Past Surgical History:  Procedure Laterality Date   ABDOMINAL AORTIC ANEURYSM REPAIR N/A 10/14/2019   Procedure: OPEN ABDOMINAL AORTIC ANEURYSM REPAIR USING HEMASHIELD GOLD 33mm x 30cm GRAFT;  Surgeon: Angelia Mould, MD;  Location: [facility];  Service: Vascular;  Laterality: N/A;   CARDIAC CATHETERIZATION  07-09-2007   CHOLECYSTECTOMY     COLONOSCOPY WITH PROPOFOL N/A 05/31/2018   Procedure: COLONOSCOPY WITH PROPOFOL;  Surgeon: Rogene Houston, MD;  [facility];  Service: Endoscopy;  Laterality: N/A;  1:45   CORONARY STENT PLACEMENT     x3 vessels bifurcated single vessel   CYSTOSCOPY W/ RETROGRADES Bilateral 09/26/2019   Procedure: CYSTOSCOPY WITH RETROGRADE PYELOGRAM;  Surgeon: Alexis Frock, MD;  Location: [facility];   Service: Urology;  Laterality: Bilateral;   ESOPHAGOGASTRODUODENOSCOPY (EGD) WITH PROPOFOL N/A 05/31/2018   Procedure: ESOPHAGOGASTRODUODENOSCOPY (EGD) WITH PROPOFOL;  Surgeon: Rogene Houston, MD;  [facility];  Service: Endoscopy;  Laterality: N/A;   ESOPHAGOGASTRODUODENOSCOPY (EGD) WITH PROPOFOL N/A 08/08/2019   Procedure: ESOPHAGOGASTRODUODENOSCOPY (EGD) WITH PROPOFOL;  Surgeon: Rogene Houston, MD;  [facility];  Service: Endoscopy;  Laterality: N/A;   KNEE SURGERY     Left knee  steel rod   POLYPECTOMY  05/31/2018   Procedure: POLYPECTOMY;  Surgeon: Rogene Houston, MD;  [facility];  Service: Endoscopy;;  sigmoid   TRANSURETHRAL RESECTION OF BLADDER TUMOR N/A 09/26/2019   Procedure: TRANSURETHRAL RESECTION OF BLADDER TUMOR (TURBT);  Surgeon: Alexis Frock, MD;  Location: [facility];  Service: Urology;  Laterality: N/A;  1 HR     reports that he quit smoking about 16 years ago. His smoking use included cigarettes. He has a 15.00 pack-year smoking history. He has never used smokeless tobacco. He reports that he does not drink alcohol and does not use drugs.  Allergies  Allergen Reactions   Tramadol Other (See Comments)    Excessive sleepiness/difficulty waking patient    Family History  Problem Relation Age of Onset   Heart attack Brother 14       died   Hypertension Mother    Heart attack Father     Prior to Admission medications   Medication Sig Start Date End Date Taking? Authorizing Provider  aspirin EC 81 MG tablet Take 81 mg by mouth daily.   Yes [provider]  atorvastatin (LIPITOR) 40 MG tablet Take 1 tablet by mouth once daily 04/25/21  Yes Josue Hector, MD  clopidogrel (PLAVIX) 75 MG tablet Take 1 tablet by mouth once daily 11/26/20  Yes Josue Hector, MD  ELDERBERRY PO Take 1 tablet by mouth daily.    Yes [provider]  ondansetron (ZOFRAN) 4 MG tablet Take 8 mg by mouth 2 (two) times daily. 05/16/21  Yes  [provider]  pantoprazole (PROTONIX) 40 MG tablet Take 1 tablet (40 mg total) by mouth daily. Take 30 min before meals 09/07/19  Yes Aline August, MD  Probiotic Product (PROBIOTIC PO) Take 1 tablet by mouth daily.   Yes [provider]  ferrous sulfate 325 (65 FE) MG tablet Take 1 tablet (325 mg total) by mouth 2 (two) times daily with a meal. Patient not taking: Reported on 05/19/2021 08/08/19   Orson Eva, MD  nitroGLYCERIN (NITROSTAT) 0.4 MG SL tablet Place 1 tablet (0.4 mg total) under the tongue every 5 (five) minutes x 3 doses as needed for chest pain. 05/04/20   Josue Hector, MD    Physical Exam: Vitals:   05/19/21 1335 05/19/21 1400 05/19/21 1412 05/19/21 1445  BP: 123/68 135/77  123/66  Pulse: 78 74  80  Resp: (!) 25 (!) 28  (!) 25  Temp:      TempSrc:  SpO2: 100% 99%  96%  Weight:   88.9 kg   Height:   5\' 6"  (1.676 m)     Constitutional: frail appearing somnolent but arousable elderly male, NAD, calm, comfortable Eyes: PERRL, lids and conjunctivae normal ENMT: Mucous membranes are dry. Posterior pharynx clear of any exudate or lesions.Normal dentition.  Neck: normal, supple, no masses, no thyromegaly Respiratory: clear to auscultation bilaterally, no wheezing, no crackles. Normal respiratory effort. No accessory muscle use.  Cardiovascular: normal s1, s2 sounds.  5-6/3 systolic murmur heard, No extremity edema. 2+ pedal pulses. No carotid bruits.  Abdomen: no tenderness, no masses palpated. No hepatosplenomegaly. Bowel sounds positive.  Musculoskeletal: no clubbing / cyanosis. No joint deformity upper and lower extremities. Good ROM, no contractures. Normal muscle tone.  Skin: no rashes, lesions, ulcers. No induration Neurologic: CN 2-12 grossly intact. Sensation intact, DTR normal. Strength 5/5 in all 4.  Psychiatric: Normal judgment and insight. Alert and oriented x 3. Normal mood.   Labs on Admission: I have personally reviewed following labs  and imaging studies  CBC: Recent Labs  Lab 05/19/21 1151  WBC 14.6*  NEUTROABS 11.3*  HGB 10.3*  HCT 39.6  MCV 82.3  PLT 875   Basic Metabolic Panel: Recent Labs  Lab 05/19/21 1151  NA 136  K 4.5  CL 95*  CO2 32  GLUCOSE 156*  BUN 29*  CREATININE 1.13  CALCIUM 9.1   GFR: Estimated Creatinine Clearance: 54.4 mL/min (by C-G formula based on SCr of 1.13 mg/dL). Liver Function Tests: Recent Labs  Lab 05/19/21 1151  AST 303*  ALT 272*  ALKPHOS 81  BILITOT 0.7  PROT 8.0  ALBUMIN 4.2   Recent Labs  Lab 05/19/21 1151  LIPASE 32   No results for input(s): AMMONIA in the last 168 hours. Coagulation Profile: No results for input(s): INR, PROTIME in the last 168 hours. Cardiac Enzymes: No results for input(s): CKTOTAL, CKMB, CKMBINDEX, TROPONINI in the last 168 hours. BNP (last 3 results) No results for input(s): PROBNP in the last 8760 hours. HbA1C: No results for input(s): HGBA1C in the last 72 hours. CBG: No results for input(s): GLUCAP in the last 168 hours. Lipid Profile: No results for input(s): CHOL, HDL, LDLCALC, TRIG, CHOLHDL, LDLDIRECT in the last 72 hours. Thyroid Function Tests: No results for input(s): TSH, T4TOTAL, FREET4, T3FREE, THYROIDAB in the last 72 hours. Anemia Panel: No results for input(s): VITAMINB12, FOLATE, FERRITIN, TIBC, IRON, RETICCTPCT in the last 72 hours. Urine analysis:    Component Value Date/Time   COLORURINE YELLOW 10/09/2019 0821   APPEARANCEUR CLOUDY (A) 10/09/2019 0821   LABSPEC 1.015 10/09/2019 0821   PHURINE 6.0 10/09/2019 0821   GLUCOSEU NEGATIVE 10/09/2019 0821   HGBUR LARGE (A) 10/09/2019 0821   BILIRUBINUR NEGATIVE 10/09/2019 0821   KETONESUR NEGATIVE 10/09/2019 0821   PROTEINUR 30 (A) 10/09/2019 0821   NITRITE POSITIVE (A) 10/09/2019 0821   LEUKOCYTESUR LARGE (A) 10/09/2019 0821    Radiological Exams on Admission: DG Chest 1 View  Result Date: 05/19/2021 CLINICAL DATA:  Shortness of breath. EXAM: CHEST   1 VIEW COMPARISON:  Chest x-ray from same day at 1222 hours. FINDINGS: Stable cardiomegaly. Unchanged bibasilar atelectasis/scarring. Linear density overlying the peripheral right lung corresponds to a prominent lung marking on CT from same day. No focal consolidation, pleural effusion, or pneumothorax. No acute osseous abnormality. IMPRESSION: 1. Linear density overlying the peripheral right lung corresponds to a prominent lung marking. No pneumothorax. Electronically Signed   By: Titus Dubin  M.D.   On: 05/19/2021 14:41   CT ABDOMEN PELVIS W CONTRAST  Result Date: 05/19/2021 CLINICAL DATA:  Acute generalized abdominal pain. EXAM: CT ABDOMEN AND PELVIS WITH CONTRAST TECHNIQUE: Multidetector CT imaging of the abdomen and pelvis was performed using the standard protocol following bolus administration of intravenous contrast. CONTRAST:  175mL OMNIPAQUE IOHEXOL 300 MG/ML  SOLN COMPARISON:  September 23, 2019. FINDINGS: Lower chest: Mild bibasilar subsegmental atelectasis is noted. Hepatobiliary: No focal liver abnormality is seen. Status post cholecystectomy. No biliary dilatation. Pancreas: Unremarkable. No pancreatic ductal dilatation or surrounding inflammatory changes. Spleen: Normal in size without focal abnormality. Adrenals/Urinary Tract: Adrenal glands appear normal. Right renal cyst is noted. No hydronephrosis or renal obstruction is noted. No renal or ureteral calculi are noted. Urinary bladder is unremarkable. Stomach/Bowel: Stomach is within normal limits. Appendix appears normal. No evidence of bowel wall thickening, distention, or inflammatory changes. Vascular/Lymphatic: Status post surgical repair of abdominal aorta. No adenopathy is noted. Reproductive: Prostate is unremarkable. Other: No abdominal wall hernia or abnormality. No abdominopelvic ascites. Musculoskeletal: Minimal grade 1 anterolisthesis is noted secondary to bilateral L5 spondylolysis. IMPRESSION: No acute abnormality seen in the  abdomen or pelvis. Aortic Atherosclerosis (ICD10-I70.0). Electronically Signed   By: Marijo Conception M.D.   On: 05/19/2021 13:33   DG Chest Portable 1 View  Result Date: 05/19/2021 CLINICAL DATA:  Shortness of breath EXAM: PORTABLE CHEST 1 VIEW COMPARISON:  Previous studies including the examination of 10/15/2019 FINDINGS: Transverse diameter of heart is increased. There are no signs of pulmonary edema. There are linear densities in medial left lower lung fields. Left lateral CP angle is indistinct. There is no definite pneumothorax. Faint linear density seen in the lateral aspect of right lower lung fields may suggest pleural thickening. Less likely possibility would be loculated right pneumothorax. IMPRESSION: Cardiomegaly. Increased markings in the medial lower lung fields may suggest scarring or subsegmental atelectasis. There are no new focal infiltrates. There is faint linear radiopacity in the lateral aspect of right lower lung fields which may be due to pleural thickening or suggest small loculated right pneumothorax. There is no demonstrable right apical pneumothorax. Repeat PA views of chest in the inspiration and expiration may be considered. Electronically Signed   By: Elmer Picker M.D.   On: 05/19/2021 12:42    EKG: Independently reviewed. NSR with RBBB  Assessment/Plan Principal Problem:   NSTEMI (non-ST elevated myocardial infarction) ([place]) Active Problems:   Acute respiratory failure with hypoxia (HCC)   Elevated lipids   CORONARY ARTERY ANEURYSM   CLAUDICATION   HTN (hypertension)   Murmur   Polio   Leukocytosis   Iron deficiency anemia   Coronary artery disease   Iron deficiency anemia due to chronic blood loss   Bladder mass   AAA (abdominal aortic aneurysm)   S/P arterial stent   Abdominal aortic aneurysm (AAA)   Abdominal aortic aneurysm (AAA) greater than 5.5 cm in diameter in male   GERD (gastroesophageal reflux disease)   HOH (hard of hearing)   Elevated  liver enzymes    NSTEMI - troponins rising - continue IV heparin as ordered - continue aspirin as ordered - monitor telemetry - cardiology consultation  - Pt had recent 2D Echo 05/11/21 with moderate AS, EF 60-65% with no RWMAs.  - fentanyl IV PRN pain symptoms - NTG prn chest pain   Acute respiratory failure with hypoxia  - pt was down to 79% pulse ox now up to 98% on supplemental nasal cannula -  chest xray repeated x 2, no pneumothorax suspected, no pneumonia seen - continue to monitor pulse oxygenation closely  IDA  - pt had EGD/colon 12/19 with findings of ext hemorrhoids, one small poly removed, gastric angiodysplasia treated with clip/cautery  - transfuse for Hg<8 given acute MI - follow CBC daily   AAA - s/p open repair by Dr. Scot Dock 10/14/19  GERD - protonix ordered for GI protection   Elevated LFTs - cause unknown - denies heavy alcohol consumption - hold atorvastatin for now - acute hepatitis panel pending - repeat CMP in AM  - lipase normal, CT abdomen pelvis also normal - bilirubin normal  Essential hypertension  - BP well controlled at this time - see cardiology recommendations   Moderate aortic stenosis - confirmed by recent 2D Echo 11/22 - followed by cardiology service  Chronic leukocytosis  - he is followed by Dr. Delton Coombes for this - there has been no evidence of lymphoma - no s/s of infection at this time  Abdominal pain - generalized - suspect from NSTEMI - CT abd/pelvis with no abnormal findings - IV protonix ordered for GI protection    DVT prophylaxis: IV heparin infusion   Code Status: Full   Family Communication: wife at bedside   Disposition Plan: Admit to [place] called: cardiology   Admission status: INP   Level of care: [place] MD [facility] How to contact the [facility] Attending or Consulting provider [place] or covering provider during after hours 7P -7A, for this patient?  Check the  care team in [facility] and look for a) attending/consulting [facility] provider listed and b) the [facility] team listed Log into [email or web address] and use [place]'s universal password to access. If you do not have the password, please contact the hospital operator. Locate the [facility] provider you are looking for under [facility] and page to a number that you can be directly reached. If you still have difficulty reaching the provider, please page the [facility] (Director on Call) for the Hospitalists listed on amion for assistance.   If 7PM-7AM, please contact night-coverage [email or web address] Password [email or web address]  05/19/2021, 2:52 PM

## 2021-05-19 NOTE — ED Notes (Signed)
Placed on 6L O2 in triage for low O2 sats. This Rn titrated O2 to 2L, desat to 79% at this rate, returned to 4L, 98% at this time

## 2021-05-19 NOTE — Consult Note (Addendum)
Cardiology Consult Note:   Patient ID: [name] MRN: [id number]; DOB: [birth year]   Admission date: 05/19/2021  PCP:  Celene Squibb, MD   [place] Providers Cardiologist:  [name], MD        Chief Complaint:  Angina and NSTEMI  Patient Profile:   [name] is a 80 y.o. male with CAD with distant LAD PCI in 2009, HTN, and HLD, hx of AAA s/p open repair 2021, hx of Mild AS mean gradients 20s and DVI 0.45  who is being seen 05/19/2021 for the evaluation of angina.  History of Present Illness:   [name]  notes that he is feeling pooly.  For the last week he has had chest pain that he attributes to indigestion.  His wife has been worried about it.  Discomfort occurs at rest.  Chest pain worsened PM of 05/18/21; notes that Dr. Johnsie Cancel has been considering repeating testing for this.  Pain became bad enough that he came to the hospital this AM.  Complicated by nausea.  Wife notes that he has also been having abdominal pain recently with lost of appetite.  It is unclear if this is related to the indigestion pain.  Notes that he is always tired.  Has had more fatigue. No syncope or near syncope.  Inpatient evaluation because of worsening pain.  Notes  no palpitations or funny heart beats.     In the ED patient received medications ASA and nitroglycerin with improvement of the pain.   Key labs notable for troponin of 1000+ .    Cardiology called for evaluation.  In interval of this, a CT was performed showing no evidence of AAA repair issues.  Heparin was started.  At time of evaluation , sent lactic acid that was WNL.  Troponin increase to 1400.  Patient has a prior Echo with normal LV function mild-mod AS, and no WMAs.   Past Medical History:  Diagnosis Date   AAA (abdominal aortic aneurysm)    Bladder cancer ([place])    s/p TURBT 09/26/19   Blood transfusion without reported diagnosis    Coronary artery disease    stents placed   GERD (gastroesophageal reflux  disease)    HOH (hard of hearing)    Hyperlipidemia    Hypertension    Iron deficiency anemia 05/14/2018   Polio    hx of. right arm atrophy    Past Surgical History:  Procedure Laterality Date   ABDOMINAL AORTIC ANEURYSM REPAIR N/A 10/14/2019   Procedure: OPEN ABDOMINAL AORTIC ANEURYSM REPAIR USING HEMASHIELD GOLD 31mm x 30cm GRAFT;  Surgeon: Angelia Mould, MD;  Location: [facility];  Service: Vascular;  Laterality: N/A;   CARDIAC CATHETERIZATION  07-09-2007   CHOLECYSTECTOMY     COLONOSCOPY WITH PROPOFOL N/A 05/31/2018   Procedure: COLONOSCOPY WITH PROPOFOL;  Surgeon: Rogene Houston, MD;  [facility];  Service: Endoscopy;  Laterality: N/A;  1:45   CORONARY STENT PLACEMENT     x3 vessels bifurcated single vessel   CYSTOSCOPY W/ RETROGRADES Bilateral 09/26/2019   Procedure: CYSTOSCOPY WITH RETROGRADE PYELOGRAM;  Surgeon: Alexis Frock, MD;  Location: [facility];  Service: Urology;  Laterality: Bilateral;   ESOPHAGOGASTRODUODENOSCOPY (EGD) WITH PROPOFOL N/A 05/31/2018   Procedure: ESOPHAGOGASTRODUODENOSCOPY (EGD) WITH PROPOFOL;  Surgeon: Rogene Houston, MD;  [facility];  Service: Endoscopy;  Laterality: N/A;   ESOPHAGOGASTRODUODENOSCOPY (EGD) WITH PROPOFOL N/A 08/08/2019   Procedure: ESOPHAGOGASTRODUODENOSCOPY (EGD) WITH PROPOFOL;  Surgeon: Rogene Houston, MD;  [facility];  Service: Endoscopy;  Laterality: N/A;   KNEE SURGERY     Left knee  steel rod   POLYPECTOMY  05/31/2018   Procedure: POLYPECTOMY;  Surgeon: Rogene Houston, MD;  [facility];  Service: Endoscopy;;  sigmoid   TRANSURETHRAL RESECTION OF BLADDER TUMOR N/A 09/26/2019   Procedure: TRANSURETHRAL RESECTION OF BLADDER TUMOR (TURBT);  Surgeon: Alexis Frock, MD;  Location: [facility];  Service: Urology;  Laterality: N/A;  1 HR     Medications Prior to Admission: Prior to Admission medications   Medication Sig Start Date End Date Taking? Authorizing Provider  aspirin EC 81  MG tablet Take 81 mg by mouth daily.   Yes [provider]  atorvastatin (LIPITOR) 40 MG tablet Take 1 tablet by mouth once daily 04/25/21  Yes Josue Hector, MD  clopidogrel (PLAVIX) 75 MG tablet Take 1 tablet by mouth once daily 11/26/20  Yes Josue Hector, MD  ELDERBERRY PO Take 1 tablet by mouth daily.    Yes [provider]  ondansetron (ZOFRAN) 4 MG tablet Take 8 mg by mouth 2 (two) times daily. 05/16/21  Yes [provider]  pantoprazole (PROTONIX) 40 MG tablet Take 1 tablet (40 mg total) by mouth daily. Take 30 min before meals 09/07/19  Yes Aline August, MD  Probiotic Product (PROBIOTIC PO) Take 1 tablet by mouth daily.   Yes [provider]  ferrous sulfate 325 (65 FE) MG tablet Take 1 tablet (325 mg total) by mouth 2 (two) times daily with a meal. Patient not taking: Reported on 05/19/2021 08/08/19   Orson Eva, MD  nitroGLYCERIN (NITROSTAT) 0.4 MG SL tablet Place 1 tablet (0.4 mg total) under the tongue every 5 (five) minutes x 3 doses as needed for chest pain. 05/04/20   Josue Hector, MD     Allergies:    Allergies  Allergen Reactions   Tramadol Other (See Comments)    Excessive sleepiness/difficulty waking patient    Social History:   Social History   Socioeconomic History   Marital status: Married    Spouse name: Not on file   Number of children: 2   Years of education: Not on file   Highest education level: Not on file  Occupational History   Occupation: retired Furniture conservator/restorer  Tobacco Use   Smoking status: Former    Packs/day: 1.00    Years: 15.00    Pack years: 15.00    Types: Cigarettes    Quit date: 06/12/2004    Years since quitting: 16.9   Smokeless tobacco: Never  Vaping Use   Vaping Use: Never used  Substance and Sexual Activity   Alcohol use: No    Alcohol/week: 0.0 standard drinks   Drug use: No   Sexual activity: Not Currently    Birth control/protection: None  Other Topics Concern   Not on file  Social  History Narrative   ** Merged History Encounter **       Social Determinants of Health   Financial Resource Strain: Not on file  Food Insecurity: Not on file  Transportation Needs: Not on file  Physical Activity: Not on file  Stress: Not on file  Social Connections: Not on file  Intimate Partner Violence: Not on file    Family History:   The patient's family history includes Heart attack in his father; Heart attack (age of onset: 55) in his brother; Hypertension in his mother.    ROS:  Please see the  history of present illness.  All other ROS reviewed and negative.     Physical Exam/Data:   Vitals:   05/19/21 1335 05/19/21 1400 05/19/21 1412 05/19/21 1445  BP: 123/68 135/77  123/66  Pulse: 78 74  80  Resp: (!) 25 (!) 28  (!) 25  Temp:      TempSrc:      SpO2: 100% 99%  96%  Weight:   88.9 kg   Height:   5\' 6"  (1.676 m)    No intake or output data in the 24 hours ending 05/19/21 1517 Last 3 Weights 05/19/2021 10/26/2020 07/07/2020  Weight (lbs) 196 lb 197 lb 190 lb  Weight (kg) 88.905 kg 89.359 kg 86.183 kg     Body mass index is 31.64 kg/m.    Gen: mild distress elderly male  Neck: No JVD Cardiac: No Rubs or Gallops, AS Murmur, regular rate, +2 radial pulses Respiratory: Clear to auscultation bilaterally, normal effort, normal  respiratory rate GI: Soft, nontender, non-distended  MS: Non pitting bilateral edema; both legs are weak Integument: Skin feels cool Neuro:  At time of evaluation, alert and oriented to person/place/time/situation  Psych: Normal affect, patient feels fatigued   EKG:  The ECG that was done  was personally reviewed and demonstrates SR with RBBB inferior infarct pattern and ST depressions in the septal leads; similar to 10/26/20   Laboratory Data:  High Sensitivity Troponin:   Recent Labs  Lab 05/19/21 1131 05/19/21 1403  TROPONINIHS 1,033* 1,452*      Chemistry Recent Labs  Lab 05/19/21 1151  NA 136  K 4.5  CL 95*  CO2 32   GLUCOSE 156*  BUN 29*  CREATININE 1.13  CALCIUM 9.1  GFRNONAA >60  ANIONGAP 9    Recent Labs  Lab 05/19/21 1151  PROT 8.0  ALBUMIN 4.2  AST 303*  ALT 272*  ALKPHOS 81  BILITOT 0.7   Lipids No results for input(s): CHOL, TRIG, HDL, LABVLDL, LDLCALC, CHOLHDL in the last 168 hours. Hematology Recent Labs  Lab 05/19/21 1151  WBC 14.6*  RBC 4.81  HGB 10.3*  HCT 39.6  MCV 82.3  MCH 21.4*  MCHC 26.0*  RDW 17.7*  PLT 362   Thyroid  Recent Labs  Lab 05/19/21 1401  TSH 0.762   BNPNo results for input(s): BNP, PROBNP in the last 168 hours.  DDimer No results for input(s): DDIMER in the last 168 hours.   Radiology/Studies:  DG Chest 1 View  Result Date: 05/19/2021 CLINICAL DATA:  Shortness of breath. EXAM: CHEST  1 VIEW COMPARISON:  Chest x-ray from same day at 1222 hours. FINDINGS: Stable cardiomegaly. Unchanged bibasilar atelectasis/scarring. Linear density overlying the peripheral right lung corresponds to a prominent lung marking on CT from same day. No focal consolidation, pleural effusion, or pneumothorax. No acute osseous abnormality. IMPRESSION: 1. Linear density overlying the peripheral right lung corresponds to a prominent lung marking. No pneumothorax. Electronically Signed   By: Titus Dubin M.D.   On: 05/19/2021 14:41   CT ABDOMEN PELVIS W CONTRAST  Result Date: 05/19/2021 CLINICAL DATA:  Acute generalized abdominal pain. EXAM: CT ABDOMEN AND PELVIS WITH CONTRAST TECHNIQUE: Multidetector CT imaging of the abdomen and pelvis was performed using the standard protocol following bolus administration of intravenous contrast. CONTRAST:  149mL OMNIPAQUE IOHEXOL 300 MG/ML  SOLN COMPARISON:  September 23, 2019. FINDINGS: Lower chest: Mild bibasilar subsegmental atelectasis is noted. Hepatobiliary: No focal liver abnormality is seen. Status post cholecystectomy. No biliary dilatation. Pancreas: Unremarkable.  No pancreatic ductal dilatation or surrounding inflammatory changes.  Spleen: Normal in size without focal abnormality. Adrenals/Urinary Tract: Adrenal glands appear normal. Right renal cyst is noted. No hydronephrosis or renal obstruction is noted. No renal or ureteral calculi are noted. Urinary bladder is unremarkable. Stomach/Bowel: Stomach is within normal limits. Appendix appears normal. No evidence of bowel wall thickening, distention, or inflammatory changes. Vascular/Lymphatic: Status post surgical repair of abdominal aorta. No adenopathy is noted. Reproductive: Prostate is unremarkable. Other: No abdominal wall hernia or abnormality. No abdominopelvic ascites. Musculoskeletal: Minimal grade 1 anterolisthesis is noted secondary to bilateral L5 spondylolysis. IMPRESSION: No acute abnormality seen in the abdomen or pelvis. Aortic Atherosclerosis (ICD10-I70.0). Electronically Signed   By: Marijo Conception M.D.   On: 05/19/2021 13:33   DG Chest Portable 1 View  Result Date: 05/19/2021 CLINICAL DATA:  Shortness of breath EXAM: PORTABLE CHEST 1 VIEW COMPARISON:  Previous studies including the examination of 10/15/2019 FINDINGS: Transverse diameter of heart is increased. There are no signs of pulmonary edema. There are linear densities in medial left lower lung fields. Left lateral CP angle is indistinct. There is no definite pneumothorax. Faint linear density seen in the lateral aspect of right lower lung fields may suggest pleural thickening. Less likely possibility would be loculated right pneumothorax. IMPRESSION: Cardiomegaly. Increased markings in the medial lower lung fields may suggest scarring or subsegmental atelectasis. There are no new focal infiltrates. There is faint linear radiopacity in the lateral aspect of right lower lung fields which may be due to pleural thickening or suggest small loculated right pneumothorax. There is no demonstrable right apical pneumothorax. Repeat PA views of chest in the inspiration and expiration may be considered. Electronically Signed    By: Elmer Picker M.D.   On: 05/19/2021 12:42     Coronary Artery Disease presenting with NSTEMI HTN, and HLD Hx of AAA s/p open repair 2021 hx of Mild-mod AS mean gradients 20s and DVI 2.83 Complicated by transaminitis and nausea that does not appear to be shock related - symptomatic  - discussed with ED provider: ASA load, heparin load - statin held in the setting of LFTs -  PPI - a portion of his abdominal sx are related to NSTEMI, there is a non cardiac portion that our [facility] Primary colleagues are evaluating  - recommend for transfer to [facility] for consultation and potential LHC; I do not have any contraindication for this  We will continue to follow bother here and at [facility]  We have discussed [place]; patient and wife are amenable. Has bed assignment in [place].   Risk Assessment/Risk Scores:    TIMI Risk Score for Unstable Angina or Non-ST Elevation MI:   The patient's TIMI risk score is 5, which indicates a 26% risk of all cause mortality, new or recurrent myocardial infarction or need for urgent revascularization in the next 14 days.   For questions or updates, please contact [place] Please consult [email or web address] for contact info under     Signed, Werner Lean, MD  05/19/2021 3:17 PM

## 2021-05-20 ENCOUNTER — Inpatient Hospital Stay (HOSPITAL_COMMUNITY): Payer: Medicare HMO

## 2021-05-20 ENCOUNTER — Inpatient Hospital Stay (HOSPITAL_COMMUNITY)
Admission: EM | Disposition: A | Discharge status: Skilled Nursing Facility | Payer: Self-pay | Source: Home / Self Care | Attending: Internal Medicine

## 2021-05-20 ENCOUNTER — Encounter (HOSPITAL_COMMUNITY): Payer: Self-pay | Admitting: Cardiology

## 2021-05-20 DIAGNOSIS — J9601 Acute respiratory failure with hypoxia: Secondary | ICD-10-CM | POA: Diagnosis not present

## 2021-05-20 DIAGNOSIS — G9340 Encephalopathy, unspecified: Secondary | ICD-10-CM | POA: Diagnosis not present

## 2021-05-20 DIAGNOSIS — R7401 Elevation of levels of liver transaminase levels: Secondary | ICD-10-CM

## 2021-05-20 DIAGNOSIS — G928 Other toxic encephalopathy: Secondary | ICD-10-CM

## 2021-05-20 DIAGNOSIS — N3289 Other specified disorders of bladder: Secondary | ICD-10-CM

## 2021-05-20 DIAGNOSIS — E785 Hyperlipidemia, unspecified: Secondary | ICD-10-CM

## 2021-05-20 DIAGNOSIS — I2541 Coronary artery aneurysm: Secondary | ICD-10-CM

## 2021-05-20 DIAGNOSIS — K219 Gastro-esophageal reflux disease without esophagitis: Secondary | ICD-10-CM

## 2021-05-20 DIAGNOSIS — I25118 Atherosclerotic heart disease of native coronary artery with other forms of angina pectoris: Secondary | ICD-10-CM

## 2021-05-20 DIAGNOSIS — I214 Non-ST elevation (NSTEMI) myocardial infarction: Secondary | ICD-10-CM | POA: Diagnosis not present

## 2021-05-20 HISTORY — PX: LEFT HEART CATH AND CORONARY ANGIOGRAPHY: CATH118249

## 2021-05-20 LAB — POCT I-STAT 7, (LYTES, BLD GAS, ICA,H+H)
Acid-Base Excess: 7 mmol/L — ABNORMAL HIGH (ref 0.0–2.0)
Bicarbonate: 34.2 mmol/L — ABNORMAL HIGH (ref 20.0–28.0)
Calcium, Ion: 1.18 mmol/L (ref 1.15–1.40)
HCT: 34 % — ABNORMAL LOW (ref 39.0–52.0)
Hemoglobin: 11.6 g/dL — ABNORMAL LOW (ref 13.0–17.0)
O2 Saturation: 92 %
Patient temperature: 98.9
Potassium: 4.2 mmol/L (ref 3.5–5.1)
Sodium: 138 mmol/L (ref 135–145)
TCO2: 36 mmol/L — ABNORMAL HIGH (ref 22–32)
pCO2 arterial: 60.2 mmHg — ABNORMAL HIGH (ref 32.0–48.0)
pH, Arterial: 7.362 (ref 7.350–7.450)
pO2, Arterial: 70 mmHg — ABNORMAL LOW (ref 83.0–108.0)

## 2021-05-20 LAB — COMPREHENSIVE METABOLIC PANEL
ALT: 709 U/L — ABNORMAL HIGH (ref 0–44)
ALT: 700 U/L — ABNORMAL HIGH (ref 0–44)
AST: 871 U/L — ABNORMAL HIGH (ref 15–41)
AST: 593 U/L — ABNORMAL HIGH (ref 15–41)
Albumin: 3.5 g/dL (ref 3.5–5.0)
Albumin: 3.4 g/dL — ABNORMAL LOW (ref 3.5–5.0)
Alkaline Phosphatase: 72 U/L (ref 38–126)
Alkaline Phosphatase: 75 U/L (ref 38–126)
Anion gap: 4 — ABNORMAL LOW (ref 5–15)
Anion gap: 4 — ABNORMAL LOW (ref 5–15)
BUN: 24 mg/dL — ABNORMAL HIGH (ref 8–23)
BUN: 25 mg/dL — ABNORMAL HIGH (ref 8–23)
CO2: 36 mmol/L — ABNORMAL HIGH (ref 22–32)
CO2: 35 mmol/L — ABNORMAL HIGH (ref 22–32)
Calcium: 8.5 mg/dL — ABNORMAL LOW (ref 8.9–10.3)
Calcium: 7.9 mg/dL — ABNORMAL LOW (ref 8.9–10.3)
Chloride: 96 mmol/L — ABNORMAL LOW (ref 98–111)
Chloride: 97 mmol/L — ABNORMAL LOW (ref 98–111)
Creatinine, Ser: 0.67 mg/dL (ref 0.61–1.24)
Creatinine, Ser: 0.49 mg/dL — ABNORMAL LOW (ref 0.61–1.24)
GFR, Estimated: >60 mL/min (ref >60)
GFR, Estimated: >60 mL/min (ref >60)
Glucose, Bld: 131 mg/dL — ABNORMAL HIGH (ref 70–99)
Glucose, Bld: 173 mg/dL — ABNORMAL HIGH (ref 70–99)
Potassium: 4.3 mmol/L (ref 3.5–5.1)
Potassium: 5 mmol/L (ref 3.5–5.1)
Sodium: 136 mmol/L (ref 135–145)
Sodium: 136 mmol/L (ref 135–145)
Total Bilirubin: 0.6 mg/dL (ref 0.3–1.2)
Total Bilirubin: 0.7 mg/dL (ref 0.3–1.2)
Total Protein: 6.5 g/dL (ref 6.5–8.1)
Total Protein: 6.4 g/dL — ABNORMAL LOW (ref 6.5–8.1)

## 2021-05-20 LAB — BASIC METABOLIC PANEL
Anion gap: 4 — ABNORMAL LOW (ref 5–15)
BUN: 24 mg/dL — ABNORMAL HIGH (ref 8–23)
CO2: 34 mmol/L — ABNORMAL HIGH (ref 22–32)
Calcium: 7.9 mg/dL — ABNORMAL LOW (ref 8.9–10.3)
Chloride: 97 mmol/L — ABNORMAL LOW (ref 98–111)
Creatinine, Ser: 0.48 mg/dL — ABNORMAL LOW (ref 0.61–1.24)
GFR, Estimated: >60 mL/min (ref >60)
Glucose, Bld: 171 mg/dL — ABNORMAL HIGH (ref 70–99)
Potassium: 5 mmol/L (ref 3.5–5.1)
Sodium: 135 mmol/L (ref 135–145)

## 2021-05-20 LAB — GLUCOSE, CAPILLARY
Glucose-Capillary: 171 mg/dL — ABNORMAL HIGH (ref 70–99)
Glucose-Capillary: 140 mg/dL — ABNORMAL HIGH (ref 70–99)
Glucose-Capillary: 158 mg/dL — ABNORMAL HIGH (ref 70–99)
Glucose-Capillary: 97 mg/dL (ref 70–99)
Glucose-Capillary: 126 mg/dL — ABNORMAL HIGH (ref 70–99)

## 2021-05-20 LAB — CBC WITH DIFFERENTIAL/PLATELET
Abs Immature Granulocytes: 0.29 10*3/uL — ABNORMAL HIGH (ref 0.00–0.07)
Basophils Absolute: 0.1 10*3/uL (ref 0.0–0.1)
Basophils Relative: 1 %
Eosinophils Absolute: 0.1 10*3/uL (ref 0.0–0.5)
Eosinophils Relative: 1 %
HCT: 34.6 % — ABNORMAL LOW (ref 39.0–52.0)
Hemoglobin: 9.1 g/dL — ABNORMAL LOW (ref 13.0–17.0)
Immature Granulocytes: 2 %
Lymphocytes Relative: 12 %
Lymphs Abs: 1.8 10*3/uL (ref 0.7–4.0)
MCH: 21.2 pg — ABNORMAL LOW (ref 26.0–34.0)
MCHC: 26.3 g/dL — ABNORMAL LOW (ref 30.0–36.0)
MCV: 80.5 fL (ref 80.0–100.0)
Monocytes Absolute: 1.5 10*3/uL — ABNORMAL HIGH (ref 0.1–1.0)
Monocytes Relative: 10 %
Neutro Abs: 11.4 10*3/uL — ABNORMAL HIGH (ref 1.7–7.7)
Neutrophils Relative %: 74 %
Platelets: 297 10*3/uL (ref 150–400)
RBC: 4.3 MIL/uL (ref 4.22–5.81)
RDW: 17.4 % — ABNORMAL HIGH (ref 11.5–15.5)
WBC: 15.2 10*3/uL — ABNORMAL HIGH (ref 4.0–10.5)
nRBC: 0.9 % — ABNORMAL HIGH (ref 0.0–0.2)

## 2021-05-20 LAB — CBC
HCT: 34.5 % — ABNORMAL LOW (ref 39.0–52.0)
Hemoglobin: 8.9 g/dL — ABNORMAL LOW (ref 13.0–17.0)
MCH: 21.4 pg — ABNORMAL LOW (ref 26.0–34.0)
MCHC: 25.8 g/dL — ABNORMAL LOW (ref 30.0–36.0)
MCV: 83.1 fL (ref 80.0–100.0)
Platelets: 269 10*3/uL (ref 150–400)
RBC: 4.15 MIL/uL — ABNORMAL LOW (ref 4.22–5.81)
RDW: 17.3 % — ABNORMAL HIGH (ref 11.5–15.5)
WBC: 15.9 10*3/uL — ABNORMAL HIGH (ref 4.0–10.5)
nRBC: 1 % — ABNORMAL HIGH (ref 0.0–0.2)

## 2021-05-20 LAB — HEPARIN LEVEL (UNFRACTIONATED): Heparin Unfractionated: <0.1 IU/mL — ABNORMAL LOW (ref 0.30–0.70)

## 2021-05-20 LAB — LIPID PANEL
Cholesterol: 100 mg/dL (ref 0–200)
HDL: 36 mg/dL — ABNORMAL LOW (ref >40)
LDL Cholesterol: 51 mg/dL (ref 0–99)
Total CHOL/HDL Ratio: 2.8 RATIO
Triglycerides: 64 mg/dL (ref <150)
VLDL: 13 mg/dL (ref 0–40)

## 2021-05-20 LAB — BRAIN NATRIURETIC PEPTIDE: B Natriuretic Peptide: 478.2 pg/mL — ABNORMAL HIGH (ref 0.0–100.0)

## 2021-05-20 LAB — MAGNESIUM: Magnesium: 2 mg/dL (ref 1.7–2.4)

## 2021-05-20 LAB — TROPONIN I (HIGH SENSITIVITY): Troponin I (High Sensitivity): 1803 ng/L (ref <18)

## 2021-05-20 LAB — GAMMA GT: GGT: 26 U/L (ref 7–50)

## 2021-05-20 LAB — D-DIMER, QUANTITATIVE: D-Dimer, Quant: 3.32 ug/mL-FEU — ABNORMAL HIGH (ref 0.00–0.50)

## 2021-05-20 SURGERY — LEFT HEART CATH AND CORONARY ANGIOGRAPHY
Anesthesia: LOCAL

## 2021-05-20 MED ORDER — IOHEXOL 350 MG/ML SOLN
75.0000 mL | Freq: Once | INTRAVENOUS | Status: AC | PRN
  Administered 2021-05-20: 75 mL via INTRAVENOUS

## 2021-05-20 MED ORDER — ROCURONIUM BROMIDE 10 MG/ML (PF) SYRINGE
PREFILLED_SYRINGE | INTRAVENOUS | Status: AC
  Administered 2021-05-20: 100 mg
  Filled 2021-05-20: qty 10

## 2021-05-20 MED ORDER — LIDOCAINE HCL (PF) 1 % IJ SOLN
INTRAMUSCULAR | Status: DC | PRN
  Administered 2021-05-20: 15 mL

## 2021-05-20 MED ORDER — SODIUM CHLORIDE 0.9 % IV SOLN
2.0000 g | Freq: Three times a day (TID) | INTRAVENOUS | Status: AC
  Administered 2021-05-20 – 2021-05-25 (×15): 2 g via INTRAVENOUS
  Filled 2021-05-20 (×16): qty 2

## 2021-05-20 MED ORDER — CHLORHEXIDINE GLUCONATE 0.12% ORAL RINSE (MEDLINE KIT)
15.0000 mL | Freq: Two times a day (BID) | OROMUCOSAL | Status: DC
  Administered 2021-05-20 – 2021-05-21 (×3): 15 mL via OROMUCOSAL

## 2021-05-20 MED ORDER — ORAL CARE MOUTH RINSE
15.0000 mL | OROMUCOSAL | Status: DC
  Administered 2021-05-21 (×7): 15 mL via OROMUCOSAL

## 2021-05-20 MED ORDER — HEPARIN (PORCINE) IN NACL 1000-0.9 UT/500ML-% IV SOLN
INTRAVENOUS | Status: DC | PRN
  Administered 2021-05-20 (×2): 500 mL

## 2021-05-20 MED ORDER — MIDAZOLAM HCL 2 MG/2ML IJ SOLN
INTRAMUSCULAR | Status: AC
  Administered 2021-05-20: 2 mg
  Filled 2021-05-20: qty 2

## 2021-05-20 MED ORDER — SODIUM CHLORIDE 0.9 % IV SOLN: 250.0000 mL | INTRAVENOUS | Status: DC | PRN

## 2021-05-20 MED ORDER — FUROSEMIDE 10 MG/ML IJ SOLN
40.0000 mg | Freq: Once | INTRAMUSCULAR | Status: AC
  Administered 2021-05-20: 40 mg via INTRAVENOUS
  Filled 2021-05-20: qty 4

## 2021-05-20 MED ORDER — HYDRALAZINE HCL 20 MG/ML IJ SOLN: 10.0000 mg | INTRAMUSCULAR | Status: AC | PRN

## 2021-05-20 MED ORDER — LABETALOL HCL 5 MG/ML IV SOLN
10.0000 mg | INTRAVENOUS | Status: AC | PRN
  Administered 2021-05-20: 10 mg via INTRAVENOUS
  Filled 2021-05-20: qty 4

## 2021-05-20 MED ORDER — HEPARIN (PORCINE) IN NACL 1000-0.9 UT/500ML-% IV SOLN
INTRAVENOUS | Status: AC
  Filled 2021-05-20: qty 1000

## 2021-05-20 MED ORDER — IOHEXOL 350 MG/ML SOLN
INTRAVENOUS | Status: DC | PRN
  Administered 2021-05-20: 105 mL

## 2021-05-20 MED ORDER — FENTANYL CITRATE PF 50 MCG/ML IJ SOSY
25.0000 ug | PREFILLED_SYRINGE | INTRAMUSCULAR | Status: DC | PRN
  Administered 2021-05-21 (×2): 50 ug via INTRAVENOUS
  Filled 2021-05-20 (×2): qty 1

## 2021-05-20 MED ORDER — CHLORHEXIDINE GLUCONATE CLOTH 2 % EX PADS
6.0000 | MEDICATED_PAD | Freq: Every day | CUTANEOUS | Status: DC
  Administered 2021-05-20 – 2021-05-27 (×8): 6 via TOPICAL

## 2021-05-20 MED ORDER — FENTANYL CITRATE PF 50 MCG/ML IJ SOSY
50.0000 ug | PREFILLED_SYRINGE | Freq: Once | INTRAMUSCULAR | Status: AC
  Administered 2021-05-20: 50 ug via INTRAVENOUS

## 2021-05-20 MED ORDER — ETOMIDATE 2 MG/ML IV SOLN
INTRAVENOUS | Status: AC
  Administered 2021-05-20: 20 mg
  Filled 2021-05-20: qty 10

## 2021-05-20 MED ORDER — SODIUM CHLORIDE 0.9% FLUSH: 3.0000 mL | INTRAVENOUS | Status: DC | PRN

## 2021-05-20 MED ORDER — LIDOCAINE HCL (PF) 1 % IJ SOLN
INTRAMUSCULAR | Status: AC
  Filled 2021-05-20: qty 30

## 2021-05-20 MED ORDER — HEPARIN SODIUM (PORCINE) 5000 UNIT/ML IJ SOLN: 5000.0000 [IU] | Freq: Three times a day (TID) | INTRAMUSCULAR | Status: DC

## 2021-05-20 MED ORDER — VERAPAMIL HCL 2.5 MG/ML IV SOLN
INTRAVENOUS | Status: AC
  Filled 2021-05-20: qty 2

## 2021-05-20 MED ORDER — ENOXAPARIN SODIUM 100 MG/ML IJ SOSY
1.0000 mg/kg | PREFILLED_SYRINGE | Freq: Two times a day (BID) | INTRAMUSCULAR | Status: DC
  Administered 2021-05-20: 92.5 mg via SUBCUTANEOUS
  Filled 2021-05-20 (×2): qty 0.93

## 2021-05-20 MED ORDER — VANCOMYCIN HCL 2000 MG/400ML IV SOLN
2000.0000 mg | Freq: Once | INTRAVENOUS | Status: AC
  Administered 2021-05-20: 2000 mg via INTRAVENOUS
  Filled 2021-05-20: qty 400

## 2021-05-20 MED ORDER — SODIUM CHLORIDE 0.9% FLUSH
3.0000 mL | Freq: Two times a day (BID) | INTRAVENOUS | Status: DC
  Administered 2021-05-20 – 2021-05-27 (×17): 3 mL via INTRAVENOUS

## 2021-05-20 MED ORDER — SODIUM CHLORIDE 0.9 % IV SOLN: INTRAVENOUS | Status: AC

## 2021-05-20 MED ORDER — VANCOMYCIN HCL 1250 MG/250ML IV SOLN
1250.0000 mg | INTRAVENOUS | Status: DC
  Administered 2021-05-21: 1250 mg via INTRAVENOUS
  Filled 2021-05-20: qty 250

## 2021-05-20 MED ORDER — ETOMIDATE 2 MG/ML IV SOLN
INTRAVENOUS | Status: AC
  Filled 2021-05-20: qty 10

## 2021-05-20 SURGICAL SUPPLY — 9 items
CATH INFINITI 5FR MULTPACK ANG (CATHETERS) ×1 IMPLANT
CATH LAUNCHER 5F EBU3.0 (CATHETERS) IMPLANT
CATHETER LAUNCHER 5F EBU3.0 (CATHETERS) ×2
KIT HEART LEFT (KITS) ×2 IMPLANT
PACK CARDIAC CATHETERIZATION (CUSTOM PROCEDURE TRAY) ×2 IMPLANT
SHEATH PINNACLE 5F 10CM (SHEATH) ×1 IMPLANT
TRANSDUCER W/STOPCOCK (MISCELLANEOUS) ×2 IMPLANT
TUBING CIL FLEX 10 FLL-RA (TUBING) ×2 IMPLANT
WIRE EMERALD 3MM-J .035X150CM (WIRE) ×1 IMPLANT

## 2021-05-20 NOTE — Progress Notes (Addendum)
Late entry. At 1005, patient arrived into the room from the cath lab, 2 cath lab transporters at bedside and patient was unresponsive. To sternal rub and loud yelling in his ear, patient barely opened his eyes. Patient placed on monitor, NSR in the 70s, oxygen in the low 90s on 2l [place], bp in the 502D systolically. I asked cath lab staff at bedside if this is how he was because the report I got was that he was responsive but flat affect. The response I received was that he was "more lethargic now." I immediately grabbed Dr. Bonner Puna as he was on the floor and he came to bedside. I did not see the patient before he went to cath lab this am so I was unsure of how he was prior to cath. Rapid response nurse, Kelli Churn called as well as code stroke and immediately came to bedside. Dr. Bonner Puna called critical care and Dr. Lamonte Sakai came to bedside. Orders as followed and entered as MDs requested. See MDs notes. Patient transported to CT with Dr. Rory Percy and then to [facility] where I gave Lavella Lemons, Rn report at bedside. Patients wife, daughter and son taken to 2h21.

## 2021-05-20 NOTE — Consult Note (Signed)
Neurology Consultation  Reason for Consult: Code stroke for decreased responsiveness Referring Physician: Dr. Vance Gather  CC: Diminished level of responsiveness  History is obtained from: Chart review, wife  HPI: [name] is a 80 y.o. 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  Reportedly the patient had been okay to have dinner last night but sometime during last night started becoming more drowsy-to a point where he was not given p.o. medications because it was not deemed safe given his mental status.  This morning, after the cardiac cath, he was noted to be much more unresponsive-barely opening eyes to loud voice for which a code stroke was called.  A critical care consultation emergently was also placed because he was also not exhibiting a strong gag reflex. When I examined the patient, he was barely eye-opening to noxious stimulation.  Right side was chronically weak.  I did not get good responses on the left side either.  He was emergently intubated after pretty long and complicated intubation procedure.  He had to be paralyzed for that.  Postintubation exam was extremely poor. CT head unremarkable for acute process. CTA head and neck-preliminary review with no LVO.   Wife reports that he has not been feeling well for many months but he was never unresponsive the way he is now.  She could not really tell me in great detail as to what his baseline is as she was extremely distraught at the acute situation.   LKW: Unclear-sometime last night tpa given?: no, multiple reasons: Nonfocal exam, was on heparin drip for the NSTEMI, post cardiac cath and other reasons to explain his altered mentation including extensive cardiac  derangements Premorbid modified Rankin scale (mRS): 3-to the best of my ability that is what I could get from the wife who was distraught  ROS: Unable to obtain due to altered mental status.   Past Medical History:  Diagnosis Date   AAA (abdominal aortic aneurysm)    Bladder cancer ([place])    s/p TURBT 09/26/19   Blood transfusion without reported diagnosis    Coronary artery disease    stents placed   GERD (gastroesophageal reflux disease)    HOH (hard of hearing)    Hyperlipidemia    Hypertension    Iron deficiency anemia 05/14/2018   Polio    hx of. right arm atrophy   Family History  Problem Relation Age of Onset   Heart attack Brother 58       died   Hypertension Mother    Heart attack Father    Social History:   reports that he quit smoking about 16 years ago. His smoking use included cigarettes. He has a 15.00 pack-year smoking history. He has never used smokeless tobacco. He reports that he does not drink alcohol and does not use drugs.  Medications  Current Facility-Administered Medications:    0.9 %  sodium chloride infusion, , Intravenous, Continuous, Leonie Man, MD, Last Rate: 75 mL/hr at 05/20/21 0855, Rate Change at 05/20/21 0855   aspirin tablet 325 mg, 325 mg, Oral, Daily, Johnson, Clanford L, MD, 325 mg at 05/19/21 1414   bisacodyl (DULCOLAX) EC tablet 5 mg, 5 mg, Oral, Daily PRN, Johnson, Clanford L, MD   fentaNYL (SUBLIMAZE) injection 25 mcg, 25 mcg, Intravenous, Q2H PRN, Wynetta Emery,  Clanford L, MD   heparin ADULT infusion 100 units/mL (25000 units/280mL), 1,150 Units/hr, Intravenous, Continuous, Franky Macho, RPH, Last Rate: 11.5 mL/hr at 05/20/21 0042, 1,150 Units/hr at 05/20/21 0042   ibuprofen (ADVIL) tablet 400 mg, 400 mg, Oral, Q6H PRN, Johnson, Clanford L, MD   insulin aspart (novoLOG) injection 0-9 Units, 0-9 Units, Subcutaneous, TID WC, Johnson, Clanford L, MD   nitroGLYCERIN (NITROSTAT) SL tablet 0.4 mg, 0.4 mg, Sublingual, Q5 Min x 3 PRN,  Johnson, Clanford L, MD   ondansetron (ZOFRAN) tablet 4 mg, 4 mg, Oral, Q6H PRN **OR** ondansetron (ZOFRAN) injection 4 mg, 4 mg, Intravenous, Q6H PRN, Johnson, Clanford L, MD   pantoprazole (PROTONIX) injection 40 mg, 40 mg, Intravenous, Q12H, Johnson, Clanford L, MD, 40 mg at 05/19/21 2151   sodium chloride flush (NS) 0.9 % injection 3 mL, 3 mL, Intravenous, Q12H, Strader, Brittany M, PA-C, 3 mL at 05/19/21 2152   traZODone (DESYREL) tablet 25 mg, 25 mg, Oral, QHS PRN, Wynetta Emery, Clanford L, MD  Exam: Current vital signs: BP 107/74   Pulse (!) 106   Temp 98.7 F (37.1 C) (Oral)   Resp 20   Ht 5\' 6"  (1.676 m)   Wt 92.4 kg   SpO2 99%   BMI 32.88 kg/m  Vital signs in last 24 hours: Temp:  [98.3 F (36.8 C)-99 F (37.2 C)] 98.7 F (37.1 C) (12/09 0315) Pulse Rate:  [69-106] 106 (12/09 1041) Resp:  [12-30] 20 (12/09 0932) BP: (107-173)/(63-96) 107/74 (12/09 1041) SpO2:  [64 %-100 %] 99 % (12/09 1041) Weight:  [88.9 kg-92.4 kg] 92.4 kg (12/09 0315) General: Nearly obtunded, in no distress HEENT: Normocephalic/atraumatic Lungs: Diminished breath sounds with Rales bilaterally Cardiovascular: Regular rate rhythm Abdomen soft nondistended, groin puncture site was soft with no obvious bleeding or oozing Extremities warm well perfused with intact pulses Neurological exam Nearly obtunded To noxious stimulation and sternal rub, barely opens eyes. Did not follow any commands Remained nonverbal Pupils are equal and sluggishly reactive to light bilaterally. Extremely poor gag to suctioning To extreme noxious stimulation, no withdrawal on the right, minimal flicker withdrawal on the left at times but inconsistent. Corneals present bilaterally NIH stroke scale--25  Labs I have reviewed labs in epic and the results pertinent to this consultation are:  CBC    Component Value Date/Time   WBC 15.9 (H) 05/20/2021 1032   RBC 4.15 (L) 05/20/2021 1032   HGB 8.9 (L) 05/20/2021 1032   HCT  34.5 (L) 05/20/2021 1032   PLT 269 05/20/2021 1032   MCV 83.1 05/20/2021 1032   MCH 21.4 (L) 05/20/2021 1032   MCHC 25.8 (L) 05/20/2021 1032   RDW 17.3 (H) 05/20/2021 1032   LYMPHSABS 1.8 05/20/2021 0054   MONOABS 1.5 (H) 05/20/2021 0054   EOSABS 0.1 05/20/2021 0054   BASOSABS 0.1 05/20/2021 0054   CMP     Component Value Date/Time   NA 135 05/20/2021 1043   K 5.0 05/20/2021 1043   CL 97 (L) 05/20/2021 1043   CO2 34 (H) 05/20/2021 1043   GLUCOSE 171 (H) 05/20/2021 1043   BUN 24 (H) 05/20/2021 1043   CREATININE 0.48 (L) 05/20/2021 1043   CREATININE 0.73 08/06/2019 1254   CALCIUM 7.9 (L) 05/20/2021 1043   PROT 6.5 05/20/2021 0054   ALBUMIN 3.5 05/20/2021 0054   AST 871 (H) 05/20/2021 0054   ALT 709 (H) 05/20/2021 0054   ALKPHOS 72 05/20/2021 0054   BILITOT 0.6 05/20/2021 0054   GFRNONAA >60 05/20/2021 1043  GFRNONAA 89 08/06/2019 1254   GFRAA >60 10/17/2019 0600   GFRAA 103 08/06/2019 1254   Imaging I have reviewed the images obtained:  CT-head-aspects 10, no bleed CTA head and neck - no eLVO  Assessment: 80/M with above PMH who underwent cardiac cath for NSTEMI, and was noted to be more obtunded this AM with an unclear LKW sometime last night. Exam was consistent with poor gag, unresponsiveness, non verbal patient with eye opening to nox stim and otherwise poor exam. CTH with no acute changes. CTA head neck with no LVO - done do make sure there is no basilar occlusion and none was seen. I also ordered a CT abd/pelvis to r/o retroperitoneal bleed - and preliminarily review was negative. Patient's LOC change is likely related to his cardiac conditions and likely toxic metabolic encephalopathy. Evaluate for cardiac etiology vs respiratory etiology for AMS. Not a stroke based on exam and CT and CTA findings. A MRI can be done when stabilized from the cardiac and medical perspectives to evaluate the brain further. Not a candidate for EVT due to no LVO Not a candidate for  thrombolysis due to LKW being unclear and above 4.5h. Of note - AST ALT and BNP are elevated. Troponins also were 1452-->1507-->1803 prior to cath which reported stable single vessel disease per report.  Recommendations: Medical management per primary team as you are MR brain when able to  Please recall neurology as needed. Plan d/w Dr. Lamonte Sakai  -- Amie Portland, MD Neurologist [facility] Pager: [phone]  CRITICAL CARE ATTESTATION Performed by: Amie Portland, MD Total critical care time: 65 minutes Critical care time was exclusive of separately billable procedures and treating other patients and/or supervising APPs/Residents/Students Critical care was necessary to treat or prevent imminent or life-threatening deterioration due to toxic metabolic encephalopathy, stroke like symptoms  This patient is critically ill and at significant risk for neurological worsening and/or death and care requires constant monitoring. Critical care was time spent personally by me on the following activities: development of treatment plan with patient and/or surrogate as well as nursing, discussions with consultants, evaluation of patient's response to treatment, examination of patient, obtaining history from patient or surrogate, ordering and performing treatments and interventions, ordering and review of laboratory studies, ordering and review of radiographic studies, pulse oximetry, re-evaluation of patient's condition, participation in multidisciplinary rounds and medical decision making of high complexity in the care of this patient.

## 2021-05-20 NOTE — Progress Notes (Signed)
New fever ? Aspiration  Bc and sputum sent Vanc/cefepime started

## 2021-05-20 NOTE — Procedures (Signed)
Intubation Procedure Note  [name]  [id number]  Oct 03, 1940  Date:05/20/21  Time:10:44 AM   Provider Performing:Allisha Harter S Carlisa Eble    Procedure: Intubation (31500)  Indication(s) Respiratory Failure  Consent Risks of the procedure as well as the alternatives and risks of each were explained to the patient and/or caregiver.  Consent for the procedure was obtained and is signed in the bedside chart   Anesthesia Etomidate, Versed, Fentanyl, and Rocuronium   Time Out Verified patient identification, verified procedure, site/side was marked, verified correct patient position, special equipment/implants available, medications/allergies/relevant history reviewed, required imaging and test results available.   Sterile Technique Usual hand hygeine, masks, and gloves were used   Procedure Description Patient positioned in bed supine.  Sedation given as noted above.  Patient was intubated with endotracheal tube using  MAC-4 standard scope .  View was Grade 2 only posterior commissure .  Number of attempts was  3 .  Colorimetric CO2 detector was consistent with tracheal placement.   Complications/Tolerance Transient desat to 84%, improved quickly w bag-mask ventilation  Chest X-ray is ordered to verify placement.   EBL Minimal   Specimen(s) None  Baltazar Apo, MD, PhD 05/20/2021, 10:47 AM [facility] [phone] or if no answer before 7:00PM call [phone] For any issues after 7:00PM please call eLink [phone]

## 2021-05-20 NOTE — Progress Notes (Addendum)
Notified cardiology about pt's elevation of troponin level to 1803 this am. Pt still asymptomatic. Pt is going down for cath now stated by provider.

## 2021-05-20 NOTE — Consult Note (Signed)
NAME:  [name], MRN:  [id number], DOB:  02/22/1941, LOS: 1 ADMISSION DATE:  05/19/2021, CONSULTATION DATE: 05/18/2021 REFERRING MD: Neurology, CHIEF COMPLAINT: Altered mental status requiring intubation  History of Present Illness:  80 year old male who was transferred down to [facility] non-ST elevation MI with cardiac catheterization performed on 05/20/2021 with no acute lesions.  Following cardiac cath has decreased level of consciousness and required intubation and a code stroke being called.  Pulmonary critical care called to the bedside patient was intubated per Dr. Lamonte Sakai.  Airway has been flagged as difficult.  Patient be transferred to the intensive care unit for further evaluation and treatment. .  Pertinent  Medical History   Past Medical History:  Diagnosis Date   AAA (abdominal aortic aneurysm)    Bladder cancer ([place])    s/p TURBT 09/26/19   Blood transfusion without reported diagnosis    Coronary artery disease    stents placed   GERD (gastroesophageal reflux disease)    HOH (hard of hearing)    Hyperlipidemia    Hypertension    Iron deficiency anemia 05/14/2018   Polio    hx of. right arm atrophy     Significant Hospital Events: Including procedures, antibiotic start and stop dates in addition to other pertinent events   05/20/2021 status post cardiac catheterization with no acute lesions 05/20/2021 decreased level consciousness required intubation 05/20/2021 code stroke  Interim History / Subjective:    Objective   Blood pressure 107/74, pulse (!) 106, temperature 98.7 F (37.1 C), temperature source Oral, resp. rate 20, height 5\' 6"  (1.676 m), weight 92.4 kg, SpO2 99 %.        Intake/Output Summary (Last 24 hours) at 05/20/2021 1047 Last data filed at 05/20/2021 0600 Gross per 24 hour  Intake 200.12 ml  Output --  Net 200.12 ml   Filed Weights   05/19/21 1412 05/20/21 0315  Weight: 88.9 kg 92.4 kg    Examination: General: 80 year old male  who is lethargic HENT: No JVD or lymphadenopathy is appreciated Lungs: Mild rhonchi bilaterally Cardiovascular: Heart sounds are regular Abdomen: Distended Extremities: Warm and dry Neuro: [facility] Problem list     Assessment & Plan:  Dependent respiratory failure secondary to recent non-ST elevation MI suspected stroke with decreasing level of consciousness requiring intubation 05/20/2021  Ventilator bundle Wean per protocol Serial chest x-ray   Suspected stroke plans for CT scan and further work-up CT scan of head we will also CT abdomen for completeness  Cardiac cath performed on 05/20/2021 without acute lesion in the setting of non-ST elevation MI known heart failure  Per cardiology   Anemia Recent Labs    05/20/21 0054 05/20/21 1032  HGB 9.1* 8.9*   Heparin on hold   Best Practice (right click and "Reselect all SmartList Selections" daily)   Diet/type: NPO DVT prophylaxis: not indicated GI prophylaxis: PPI Lines: N/A Foley:  N/A Code Status:  full code Last date of multidisciplinary goals of care discussion              Labs   CBC: Recent Labs  Lab 05/19/21 1151 05/20/21 0054  WBC 14.6* 15.2*  NEUTROABS 11.3* 11.4*  HGB 10.3* 9.1*  HCT 39.6 34.6*  MCV 82.3 80.5  PLT 362 998    Basic Metabolic Panel: Recent Labs  Lab 05/19/21 1151 05/20/21 0054  NA 136 136  K 4.5 4.3  CL 95* 96*  CO2 32 36*  GLUCOSE 156* 131*  BUN  29* 24*  CREATININE 1.13 0.67  CALCIUM 9.1 8.5*  MG  --  2.0   GFR: Estimated Creatinine Clearance: 78.3 mL/min (by C-G formula based on SCr of 0.67 mg/dL). Recent Labs  Lab 05/19/21 1151 05/19/21 1403 05/20/21 0054  WBC 14.6*  --  15.2*  LATICACIDVEN  --  1.5  --     Liver Function Tests: Recent Labs  Lab 05/19/21 1151 05/20/21 0054  AST 303* 871*  ALT 272* 709*  ALKPHOS 81 72  BILITOT 0.7 0.6  PROT 8.0 6.5  ALBUMIN 4.2 3.5   Recent Labs  Lab 05/19/21 1151  LIPASE 32   No results  for input(s): AMMONIA in the last 168 hours.  ABG    Component Value Date/Time   PHART 7.274 (L) 10/14/2019 1406   PCO2ART 58.7 (H) 10/14/2019 1406   PO2ART 76 (L) 10/14/2019 1406   HCO3 27.4 10/14/2019 1406   TCO2 29 10/14/2019 1406   ACIDBASEDEF 1.0 10/14/2019 1406   O2SAT 93.0 10/14/2019 1406     Coagulation Profile: No results for input(s): INR, PROTIME in the last 168 hours.  Cardiac Enzymes: No results for input(s): CKTOTAL, CKMB, CKMBINDEX, TROPONINI in the last 168 hours.  HbA1C: Hgb A1c MFr Bld  Date/Time Value Ref Range Status  05/19/2021 11:51 AM 6.4 (H) 4.8 - 5.6 % Final    Comment:    (NOTE) Pre diabetes:          5.7%-6.4%  Diabetes:              >6.4%  Glycemic control for   <7.0% adults with diabetes     CBG: Recent Labs  Lab 05/19/21 1712 05/19/21 2140 05/20/21 1037  GLUCAP 109* 140* 171*    Review of Systems:   na  Past Medical History:  He,  has a past medical history of AAA (abdominal aortic aneurysm), Bladder cancer ([place]), Blood transfusion without reported diagnosis, Coronary artery disease, GERD (gastroesophageal reflux disease), HOH (hard of hearing), Hyperlipidemia, Hypertension, Iron deficiency anemia (05/14/2018), and Polio.   Surgical History:   Past Surgical History:  Procedure Laterality Date   ABDOMINAL AORTIC ANEURYSM REPAIR N/A 10/14/2019   Procedure: OPEN ABDOMINAL AORTIC ANEURYSM REPAIR USING HEMASHIELD GOLD 82mm x 30cm GRAFT;  Surgeon: Angelia Mould, MD;  Location: [facility];  Service: Vascular;  Laterality: N/A;   CARDIAC CATHETERIZATION  07-09-2007   CHOLECYSTECTOMY     COLONOSCOPY WITH PROPOFOL N/A 05/31/2018   Procedure: COLONOSCOPY WITH PROPOFOL;  Surgeon: Rogene Houston, MD;  [facility]: [facility];  Service: Endoscopy;  Laterality: N/A;  1:45   CORONARY STENT PLACEMENT     x3 vessels bifurcated single vessel   CYSTOSCOPY W/ RETROGRADES Bilateral 09/26/2019   Procedure: CYSTOSCOPY WITH RETROGRADE PYELOGRAM;   Surgeon: Alexis Frock, MD;  Location: [facility];  Service: Urology;  Laterality: Bilateral;   ESOPHAGOGASTRODUODENOSCOPY (EGD) WITH PROPOFOL N/A 05/31/2018   Procedure: ESOPHAGOGASTRODUODENOSCOPY (EGD) WITH PROPOFOL;  Surgeon: Rogene Houston, MD;  [facility];  Service: Endoscopy;  Laterality: N/A;   ESOPHAGOGASTRODUODENOSCOPY (EGD) WITH PROPOFOL N/A 08/08/2019   Procedure: ESOPHAGOGASTRODUODENOSCOPY (EGD) WITH PROPOFOL;  Surgeon: Rogene Houston, MD;  [facility]: [facility];  Service: Endoscopy;  Laterality: N/A;   KNEE SURGERY     Left knee  steel rod   POLYPECTOMY  05/31/2018   Procedure: POLYPECTOMY;  Surgeon: Rogene Houston, MD;  [facility]: [facility];  Service: Endoscopy;;  sigmoid   TRANSURETHRAL RESECTION OF BLADDER TUMOR N/A 09/26/2019  Procedure: TRANSURETHRAL RESECTION OF BLADDER TUMOR (TURBT);  Surgeon: Alexis Frock, MD;  Location: [facility];  Service: Urology;  Laterality: N/A;  1 HR     Social History:   reports that he quit smoking about 16 years ago. His smoking use included cigarettes. He has a 15.00 pack-year smoking history. He has never used smokeless tobacco. He reports that he does not drink alcohol and does not use drugs.   Family History:  His family history includes Heart attack in his father; Heart attack (age of onset: 28) in his brother; Hypertension in his mother.   Allergies Allergies  Allergen Reactions   Tramadol Other (See Comments)    Excessive sleepiness/difficulty waking patient     Home Medications  Prior to Admission medications   Medication Sig Start Date End Date Taking? Authorizing Provider  aspirin EC 81 MG tablet Take 81 mg by mouth daily.   Yes [provider]  atorvastatin (LIPITOR) 40 MG tablet Take 1 tablet by mouth once daily 04/25/21  Yes Josue Hector, MD  clopidogrel (PLAVIX) 75 MG tablet Take 1 tablet by mouth once daily 11/26/20  Yes Josue Hector, MD  ELDERBERRY PO Take 1 tablet by mouth daily.    Yes  [provider]  ondansetron (ZOFRAN) 4 MG tablet Take 8 mg by mouth 2 (two) times daily. 05/16/21  Yes [provider]  pantoprazole (PROTONIX) 40 MG tablet Take 1 tablet (40 mg total) by mouth daily. Take 30 min before meals 09/07/19  Yes Aline August, MD  Probiotic Product (PROBIOTIC PO) Take 1 tablet by mouth daily.   Yes [provider]  ferrous sulfate 325 (65 FE) MG tablet Take 1 tablet (325 mg total) by mouth 2 (two) times daily with a meal. Patient not taking: Reported on 05/19/2021 08/08/19   Orson Eva, MD  nitroGLYCERIN (NITROSTAT) 0.4 MG SL tablet Place 1 tablet (0.4 mg total) under the tongue every 5 (five) minutes x 3 doses as needed for chest pain. 05/04/20   Josue Hector, MD     Critical care time: 33 min    Richardson Landry Quinterius Gaida ACNP Acute Care Nurse Practitioner [place] Please consult Amion 05/20/2021, 10:48 AM

## 2021-05-20 NOTE — Progress Notes (Addendum)
PROGRESS NOTE  Brief Narrative/Subjective: [name] is an 80 y.o. male with a history of AAA s/p repair, CAD s/p LAD stent, bladder CA s/p TURBT, leukocytosis, IDA, post-polio right-sided atrophy, HLD, HTN, and GERD who presented to the ED at [facility] 12/8 with nausea, vomiting, abdominal pain, and chest discomfort with increased exertional dyspnea. He was hypoxic with AST and ALT elevations (normal TBili). Troponin was elevated at 1,088 and trending upward. ECG showed RBBB. CXR and CT abd/pelvis showed no acute abnormalities to explain symptoms or hypoxia. Cardiology was consulted, initiated heparin infusion and transfer to [facility] where LHC was performed 12/9 showing stable single vessel nonobstructive CAD. His LFTs had risen since admission. On return from the cath lab, where he was noted to have received no sedation, he was obtunded. Code stroke was called and, after clarification of his code status/goals of care with his wife, PCCM was consulted for intubation for airway protection. Per wife, had difficulty swallowing this morning, but stood up to urinate last night.  Objective: BP 107/74   Pulse (!) 106   Temp 98.7 F (37.1 C) (Oral)   Resp 20   Ht 5\' 6"  (1.676 m)   Wt 92.4 kg   SpO2 99%   BMI 32.88 kg/m   Gen: Unresponsive elderly male slightly diaphoretic Pulm: Clear, 99% on supplemental oxygen CV: Regular borderline tachycardia. GI: Soft, ND, +BS  Neuro: Minimally responsive to very loud vocal stimuli and sternal rub. Absent gag reflex. Skin: Femoral access site c/d/i  Assessment & Plan: Unresponsiveness: LKW was last night.  - Code stroke called, stat CT ordered, though needs intubation for airway protection first.  - Spoke with wife who is distraught but confirms he's not DNR or DNI. Called PCCM who presented to the bedside expediently for intubation. Sign out given to Dr. Lamonte Sakai.  - Spoke with Dr. Rory Percy, neurology, who presented quickly at the bedside as well.   LFT elevation:  Prior to returning from cath lab, I had called for unassigned GI consult with  GI for rising AST and ALT despite normal TBili. Work up including CT abd/pelvis, acute hepatitis panel has been unremarkable.  - Updated GI regarding interval developments, appreciate their input.  - Of course, holding statin.  Total time: 45 minutes  Patrecia Pour, MD Pager on amion 05/20/2021, 10:57 AM

## 2021-05-20 NOTE — Consult Note (Addendum)
Consultation  Referring Provider:   Dr. Bonner Puna, [facility]   Primary Care Physician:  Celene Squibb, MD Primary Gastroenterologist:   Dr. Laural Golden      Reason for Consultation: Elevated LFTs             HPI:   [name] is a 80 y.o. male with medical history significant for hyperlipidemia, hypertension, AAA, coronary artery disease status post remote LAD stent placement, bladder cancer status post TURBT, IDA, chronic leukocytosis, history of polio with residual right arm atrophy, GERD presented to the ER with chest pain on 12/08.  Were consulted in regards to elevated liver functions.  Wife and granddaughter in the room and provides the history, as patient is currently intubated.  Denies history of elevated liver functions in the past. Recounts 77-month history of not feeling well.  He has had worsening reflux, having nausea with eating and episodes of vomiting. Worsening 2 days of GERD symptoms and emesis at home. Last episode of vomiting was the night prior to going to the ER, wife states vomits, "appeared to be poop", states dark brown 3 clumps.  Denies blood. Granddaughter states patient was also having some confusion prior to coming in, "would ask for short to be changed, and then when getting help for short change would ask why they were doing that". Patient's had constipation for past 2 weeks. Nobody else sick around him. Takes no more than 4 tylenol daily.  Denies alcohol use, recent antibiotic use.   Patient had progressive abdominal pain, chest pain shortness of breath and indigestion that resulted in coming to the ER. Patient presented to the ER with hypoxia. ALP and total bili normal.  AST 303-->871-->593 and ALT 272-->709-->700 here. White blood cell count 14.6, hemoglobin 10.3, lactic acid 1.5 Troponin 1033, 1452. Negative hepatitis panel.  Patient was taken to Cath Lab early this morning for possible non-STEMI, showed stable nonobstructive coronary artery  disease. After the catheterization patient became obtunded, had compromised airway and had to be intubated.  CT angio head neck showed no vessel occlusion or significant stenosis.  Patient on outpatient Plavix, low-dose aspirin-currently on heparin Patient was on pantoprazole 40 mg once daily On atorvastatin 40 mg daily, this was discontinued.  Previous GI work up: EGD 08/08/2019 - Dr. Laural Golden  Normal esophagus, 2 cm hiatal hernia, normal stomach/duodenal bulb, single non-bleeding angiodysplastic lesion in the duodenum. Colonoscopy for IDA 08/08/19 with Dr. Laural Golden- fair prep - One small polyp in the distal sigmoid colon, removed with a cold snare. Resected and retrieved. - External hemorrhoids.  Past Medical History:  Diagnosis Date   AAA (abdominal aortic aneurysm)    Bladder cancer ([place])    s/p TURBT 09/26/19   Blood transfusion without reported diagnosis    Coronary artery disease    stents placed   GERD (gastroesophageal reflux disease)    HOH (hard of hearing)    Hyperlipidemia    Hypertension    Iron deficiency anemia 05/14/2018   Polio    hx of. right arm atrophy    Past Surgical History:  Procedure Laterality Date   ABDOMINAL AORTIC ANEURYSM REPAIR N/A 10/14/2019   Procedure: OPEN ABDOMINAL AORTIC ANEURYSM REPAIR USING HEMASHIELD GOLD 58mm x 30cm GRAFT;  Surgeon: Angelia Mould, MD;  Location: [place];  Service: Vascular;  Laterality: N/A;   CARDIAC CATHETERIZATION  07-09-2007   CHOLECYSTECTOMY     COLONOSCOPY WITH PROPOFOL N/A 05/31/2018   Procedure: COLONOSCOPY WITH PROPOFOL;  Surgeon:  Malissa Hippo, MD;  [facility];  Service: Endoscopy;  Laterality: N/A;  1:45   CORONARY STENT PLACEMENT     x3 vessels bifurcated single vessel   CYSTOSCOPY W/ RETROGRADES Bilateral 09/26/2019   Procedure: CYSTOSCOPY WITH RETROGRADE PYELOGRAM;  Surgeon: Sebastian Ache, MD;  Location: [facility];  Service: Urology;  Laterality: Bilateral;   ESOPHAGOGASTRODUODENOSCOPY  (EGD) WITH PROPOFOL N/A 05/31/2018   Procedure: ESOPHAGOGASTRODUODENOSCOPY (EGD) WITH PROPOFOL;  Surgeon: Malissa Hippo, MD;  [facility];  Service: Endoscopy;  Laterality: N/A;   ESOPHAGOGASTRODUODENOSCOPY (EGD) WITH PROPOFOL N/A 08/08/2019   Procedure: ESOPHAGOGASTRODUODENOSCOPY (EGD) WITH PROPOFOL;  Surgeon: Malissa Hippo, MD;  [facility];  Service: Endoscopy;  Laterality: N/A;   KNEE SURGERY     Left knee  steel rod   LEFT HEART CATH AND CORONARY ANGIOGRAPHY N/A 05/20/2021   Procedure: LEFT HEART CATH AND CORONARY ANGIOGRAPHY;  Surgeon: Marykay Lex, MD;  Location: [facility];  Service: Cardiovascular;  Laterality: N/A;   POLYPECTOMY  05/31/2018   Procedure: POLYPECTOMY;  Surgeon: Malissa Hippo, MD;  [facility];  Service: Endoscopy;;  sigmoid   TRANSURETHRAL RESECTION OF BLADDER TUMOR N/A 09/26/2019   Procedure: TRANSURETHRAL RESECTION OF BLADDER TUMOR (TURBT);  Surgeon: Sebastian Ache, MD;  Location: [facility];  Service: Urology;  Laterality: N/A;  1 HR    Family History  Problem Relation Age of Onset   Heart attack Brother 93       died   Hypertension Mother    Heart attack Father      Social History   Tobacco Use   Smoking status: Former    Packs/day: 1.00    Years: 15.00    Pack years: 15.00    Types: Cigarettes    Quit date: 06/12/2004    Years since quitting: 16.9   Smokeless tobacco: Never  Vaping Use   Vaping Use: Never used  Substance Use Topics   Alcohol use: No    Alcohol/week: 0.0 standard drinks   Drug use: No    Prior to Admission medications   Medication Sig Start Date End Date Taking? Authorizing Provider  aspirin EC 81 MG tablet Take 81 mg by mouth daily.   Yes [provider]  atorvastatin (LIPITOR) 40 MG tablet Take 1 tablet by mouth once daily 04/25/21  Yes Wendall Stade, MD  clopidogrel (PLAVIX) 75 MG tablet Take 1 tablet by mouth once daily 11/26/20  Yes Wendall Stade, MD   ELDERBERRY PO Take 1 tablet by mouth daily.    Yes [provider]  ondansetron (ZOFRAN) 4 MG tablet Take 8 mg by mouth 2 (two) times daily. 05/16/21  Yes [provider]  pantoprazole (PROTONIX) 40 MG tablet Take 1 tablet (40 mg total) by mouth daily. Take 30 min before meals 09/07/19  Yes Glade Lloyd, MD  Probiotic Product (PROBIOTIC PO) Take 1 tablet by mouth daily.   Yes [provider]  ferrous sulfate 325 (65 FE) MG tablet Take 1 tablet (325 mg total) by mouth 2 (two) times daily with a meal. Patient not taking: Reported on 05/19/2021 08/08/19   Catarina Hartshorn, MD  nitroGLYCERIN (NITROSTAT) 0.4 MG SL tablet Place 1 tablet (0.4 mg total) under the tongue every 5 (five) minutes x 3 doses as needed for chest pain. 05/04/20   Wendall Stade, MD    Current Facility-Administered Medications  Medication Dose Route Frequency Provider Last Rate Last Admin  0.9 %  sodium chloride infusion  250 mL Intravenous PRN Leonie Man, MD       aspirin tablet 325 mg  325 mg Oral Daily Leonie Man, MD   325 mg at 05/19/21 1414   bisacodyl (DULCOLAX) EC tablet 5 mg  5 mg Oral Daily PRN Leonie Man, MD       Chlorhexidine Gluconate Cloth 2 % PADS 6 each  6 each Topical Daily Serafina Mitchell, MD   6 each at 05/20/21 1219   fentaNYL (SUBLIMAZE) injection 25 mcg  25 mcg Intravenous Q2H PRN Leonie Man, MD       heparin injection 5,000 Units  5,000 Units Subcutaneous Q8H Leonie Man, MD       hydrALAZINE (APRESOLINE) injection 10 mg  10 mg Intravenous Q20 Min PRN Leonie Man, MD       ibuprofen (ADVIL) tablet 400 mg  400 mg Oral Q6H PRN Leonie Man, MD       insulin aspart (novoLOG) injection 0-9 Units  0-9 Units Subcutaneous TID WC Leonie Man, MD   2 Units at 05/20/21 1255   labetalol (NORMODYNE) injection 10 mg  10 mg Intravenous Q10 min PRN Leonie Man, MD   10 mg at 05/20/21 1143   nitroGLYCERIN (NITROSTAT) SL tablet 0.4 mg  0.4 mg  Sublingual Q5 Min x 3 PRN Leonie Man, MD       ondansetron [facility]) tablet 4 mg  4 mg Oral Q6H PRN Leonie Man, MD       Or   ondansetron [facility]) injection 4 mg  4 mg Intravenous Q6H PRN Leonie Man, MD       pantoprazole (PROTONIX) injection 40 mg  40 mg Intravenous Q12H Leonie Man, MD   40 mg at 05/19/21 2151   sodium chloride flush (NS) 0.9 % injection 3 mL  3 mL Intravenous Q12H Leonie Man, MD   3 mL at 05/19/21 2152   sodium chloride flush (NS) 0.9 % injection 3 mL  3 mL Intravenous Q12H Leonie Man, MD   3 mL at 05/20/21 1201   sodium chloride flush (NS) 0.9 % injection 3 mL  3 mL Intravenous PRN Leonie Man, MD       traZODone (DESYREL) tablet 25 mg  25 mg Oral QHS PRN Leonie Man, MD        Allergies as of 05/19/2021 - Review Complete 05/19/2021  Allergen Reaction Noted   Tramadol Other (See Comments) 10/03/2019    Review of Systems:    Patient intubated, unable to ask review of systems at this time.    Physical Exam:  Vital signs in last 24 hours: Temp:  [98.4 F (36.9 C)-99 F (37.2 C)] 98.9 F (37.2 C) (12/09 1220) Pulse Rate:  [63-106] 64 (12/09 1230) Resp:  [12-28] 20 (12/09 1230) BP: (102-177)/(63-93) 102/70 (12/09 1230) SpO2:  [83 %-100 %] 98 % (12/09 1230) FiO2 (%):  [50 %-100 %] 50 % (12/09 1220) Weight:  [88.9 kg-92.4 kg] 92.4 kg (12/09 0315)    General:   Pleasant, well developed male in no acute distress Head:  Normocephalic and atraumatic. Eyes:  sclerae anicteric,conjunctive pink. Ears: Hard of hearing Neck: Supple, no masses.  Heart:  regular rate and rhythm, no murmurs or gallops Pulm: Clear anteriorly; no wheezing, patient intubated Abdomen:  Soft, Obese AB, Normal bowel sounds.  no  tenderness  on exam . Without guarding and Without  rebound, without organomegaly. Msk: Right arm atrophy, unable to move,  Otherwise no gross deformities.  Neurologic: Opens eyes, with appropriate response to questions, +  asterixis left hand, no clonus.  Skin:   Dry and intact without significant lesions or rashes. Psychiatric: No abnormal affect or behavior. Gastric canister from OG/NG tube with possible red tinge secretions  LAB RESULTS: Recent Labs    05/19/21 1151 05/20/21 0054 05/20/21 1032 05/20/21 1222  WBC 14.6* 15.2* 15.9*  --   HGB 10.3* 9.1* 8.9* 11.6*  HCT 39.6 34.6* 34.5* 34.0*  PLT 362 297 269  --    BMET Recent Labs    05/20/21 0054 05/20/21 1032 05/20/21 1043 05/20/21 1222  NA 136 136 135 138  K 4.3 5.0 5.0 4.2  CL 96* 97* 97*  --   CO2 36* 35* 34*  --   GLUCOSE 131* 173* 171*  --   BUN 24* 25* 24*  --   CREATININE 0.67 0.49* 0.48*  --   CALCIUM 8.5* 7.9* 7.9*  --    LFT Recent Labs    05/20/21 1032  PROT 6.4*  ALBUMIN 3.4*  AST 593*  ALT 700*  ALKPHOS 75  BILITOT 0.7   PT/INR No results for input(s): LABPROT, INR in the last 72 hours.  STUDIES: CT ABDOMEN PELVIS WO CONTRAST  Result Date: 05/20/2021 CLINICAL DATA:  Status post cardiac catheterization this morning, evaluate for retroperitoneal bleed EXAM: CT ABDOMEN AND PELVIS WITHOUT CONTRAST TECHNIQUE: Multidetector CT imaging of the abdomen and pelvis was performed following the standard protocol without IV contrast. COMPARISON:  05/19/2021 FINDINGS: Lower chest: Aortic valve calcifications. Coronary artery calcifications. Small bilateral pleural effusions, increased compared to prior examination. Hepatobiliary: No focal liver abnormality is seen. Status post cholecystectomy. No biliary dilatation. Pancreas: Unremarkable. No pancreatic ductal dilatation or surrounding inflammatory changes. Spleen: Normal in size without significant abnormality. Adrenals/Urinary Tract: Adrenal glands are unremarkable. Excreted contrast in the collecting systems and urinary bladder, in keeping with same-day coronary catheterization. Kidneys are otherwise normal, without renal calculi, solid lesion, or hydronephrosis. Bladder is  unremarkable. Stomach/Bowel: Stomach is within normal limits. Appendix appears normal. No evidence of bowel wall thickening, distention, or inflammatory changes. Vascular/Lymphatic: Aortic atherosclerosis. Status post graft repair of the abdominal aorta. No enlarged abdominal or pelvic lymph nodes. Reproductive: No mass or other significant abnormality. Other: No abdominal wall hernia or abnormality. Subcutaneous soft tissue stranding and pressure dressing overlying the right femoral vessels (series 3, image 89). No abdominopelvic ascites. Musculoskeletal: No acute or significant osseous findings. Chronic pars defects of L5. IMPRESSION: 1. No evidence of retroperitoneal hematoma or other acute noncontrast CT findings in the abdomen or pelvis. 2. Subcutaneous soft tissue stranding and pressure dressing overlying the right femoral vessels, in keeping with catheterization. 3. Small bilateral pleural effusions, increased compared to prior examination. 4. Status post graft repair of the abdominal aorta. Aortic Atherosclerosis (ICD10-I70.0). Electronically Signed   By: Delanna Ahmadi M.D.   On: 05/20/2021 11:34   DG Chest 1 View  Result Date: 05/19/2021 CLINICAL DATA:  Shortness of breath. EXAM: CHEST  1 VIEW COMPARISON:  Chest x-ray from same day at 1222 hours. FINDINGS: Stable cardiomegaly. Unchanged bibasilar atelectasis/scarring. Linear density overlying the peripheral right lung corresponds to a prominent lung marking on CT from same day. No focal consolidation, pleural effusion, or pneumothorax. No acute osseous abnormality. IMPRESSION: 1. Linear density overlying the peripheral right lung corresponds to a prominent lung marking. No pneumothorax. Electronically Signed   By: Huntley Dec  Derry M.D.   On: 05/19/2021 14:41   DG Abd 1 View  Result Date: 05/20/2021 CLINICAL DATA:  OG tube placement. EXAM: ABDOMEN - 1 VIEW COMPARISON:  10/14/2019 FINDINGS: 1153 hours. OG tube tip is at the esophagogastric junction  with proximal side port in the distal esophagus. Diffuse gaseous distention of small bowel and colon evident. IMPRESSION: OG tube tip is at the esophagogastric junction with proximal side port in the distal esophagus. Tube could be advanced 9-10 cm to place the proximal side port below the GE junction. Electronically Signed   By: Misty Stanley M.D.   On: 05/20/2021 12:04   CT ABDOMEN PELVIS W CONTRAST  Result Date: 05/19/2021 CLINICAL DATA:  Acute generalized abdominal pain. EXAM: CT ABDOMEN AND PELVIS WITH CONTRAST TECHNIQUE: Multidetector CT imaging of the abdomen and pelvis was performed using the standard protocol following bolus administration of intravenous contrast. CONTRAST:  170mL OMNIPAQUE IOHEXOL 300 MG/ML  SOLN COMPARISON:  September 23, 2019. FINDINGS: Lower chest: Mild bibasilar subsegmental atelectasis is noted. Hepatobiliary: No focal liver abnormality is seen. Status post cholecystectomy. No biliary dilatation. Pancreas: Unremarkable. No pancreatic ductal dilatation or surrounding inflammatory changes. Spleen: Normal in size without focal abnormality. Adrenals/Urinary Tract: Adrenal glands appear normal. Right renal cyst is noted. No hydronephrosis or renal obstruction is noted. No renal or ureteral calculi are noted. Urinary bladder is unremarkable. Stomach/Bowel: Stomach is within normal limits. Appendix appears normal. No evidence of bowel wall thickening, distention, or inflammatory changes. Vascular/Lymphatic: Status post surgical repair of abdominal aorta. No adenopathy is noted. Reproductive: Prostate is unremarkable. Other: No abdominal wall hernia or abnormality. No abdominopelvic ascites. Musculoskeletal: Minimal grade 1 anterolisthesis is noted secondary to bilateral L5 spondylolysis. IMPRESSION: No acute abnormality seen in the abdomen or pelvis. Aortic Atherosclerosis (ICD10-I70.0). Electronically Signed   By: Marijo Conception M.D.   On: 05/19/2021 13:33   CARDIAC  CATHETERIZATION  Result Date: 05/20/2021   Ost LAD to Prox LAD lesion is 40% stenosed.  Prox LAD to Mid LAD overlapping stent segment is mostly widely patent with 5% in-stent stenosis   Mid LAD lesion is 50% stenosed.   Ramus lesion is 40% stenosed.   Mild to moderate diffuse disease elsewhere.  Codominant system.   LV end diastolic pressure is mildly elevated.   There is moderate aortic valve stenosis. SUMMARY Stable single-vessel disease with widely patent LAD stents and moderate mid LAD 50% stenosis. No obvious culprit lesion to explain non-STEMI. Mildly elevated LVEDP with normal EF by echo. Moderate aortic stenosis by echo with mean gradient of roughly 20 mmHg by cath. RECOMMENDATIONS Suspect demand ischemia with ongoing illness Continue medical management. Okay to discontinue heparin Glenetta Hew, MD  DG CHEST PORT 1 VIEW  Result Date: 05/20/2021 CLINICAL DATA:  Endotracheal tube placement. EXAM: PORTABLE CHEST 1 VIEW COMPARISON:  Same day. FINDINGS: Stable cardiomegaly. Endotracheal and nasogastric tubes are unchanged in position. Stable bilateral lung opacities are noted concerning for atelectasis or possibly infiltrates. Bony thorax is unremarkable. IMPRESSION: Stable support apparatus.  Stable bibasilar opacities. Electronically Signed   By: Marijo Conception M.D.   On: 05/20/2021 12:05   DG CHEST PORT 1 VIEW  Result Date: 05/20/2021 CLINICAL DATA:  81 year old male with history of non ST elevation myocardial infarction. EXAM: PORTABLE CHEST 1 VIEW COMPARISON:  Chest x-ray 05/19/2021. FINDINGS: An endotracheal tube is in place with tip 3.2 cm above the carina. Transcutaneous defibrillator pad projecting over the mid thorax. Lung volumes are low. Bibasilar opacities (left greater  than right) may reflect areas of atelectasis and/or consolidation. Small left pleural effusion. No definite right pleural effusion. No pneumothorax. Nodular density projecting over the right upper lobe, not readily  apparent on the recent prior examination, potentially artifact, measuring 1.4 cm in diameter. Mild diffuse interstitial prominence and cephalization of the pulmonary vasculature, suggesting a background of mild interstitial pulmonary edema. Heart size is mildly enlarged. The patient is rotated to the left on today's exam, resulting in distortion of the mediastinal contours and reduced diagnostic sensitivity and specificity for mediastinal pathology. Atherosclerotic calcifications in the thoracic aorta. IMPRESSION: 1. Support apparatus, as above. 2. The appearance the chest suggests mild congestive heart failure, as above. 3. Bibasilar opacities which may reflect areas of atelectasis and/or consolidation. 4. New nodular density projecting over the region of the right upper lobe, potentially artifactual. Close attention on follow-up studies is recommended to ensure resolution of this finding. 5. Aortic atherosclerosis. Electronically Signed   By: Vinnie Langton M.D.   On: 05/20/2021 10:58   DG Chest Portable 1 View  Result Date: 05/19/2021 CLINICAL DATA:  Shortness of breath EXAM: PORTABLE CHEST 1 VIEW COMPARISON:  Previous studies including the examination of 10/15/2019 FINDINGS: Transverse diameter of heart is increased. There are no signs of pulmonary edema. There are linear densities in medial left lower lung fields. Left lateral CP angle is indistinct. There is no definite pneumothorax. Faint linear density seen in the lateral aspect of right lower lung fields may suggest pleural thickening. Less likely possibility would be loculated right pneumothorax. IMPRESSION: Cardiomegaly. Increased markings in the medial lower lung fields may suggest scarring or subsegmental atelectasis. There are no new focal infiltrates. There is faint linear radiopacity in the lateral aspect of right lower lung fields which may be due to pleural thickening or suggest small loculated right pneumothorax. There is no demonstrable  right apical pneumothorax. Repeat PA views of chest in the inspiration and expiration may be considered. Electronically Signed   By: Elmer Picker M.D.   On: 05/19/2021 12:42   CT HEAD CODE STROKE WO CONTRAST  Result Date: 05/20/2021 CLINICAL DATA:  Code stroke. Neuro deficit, acute, stroke suspected. EXAM: CT HEAD WITHOUT CONTRAST TECHNIQUE: Contiguous axial images were obtained from the base of the skull through the vertex without intravenous contrast. COMPARISON:  Head CT 06/10/2007. FINDINGS: Brain: Mild generalized cerebral atrophy, not unexpected for age. There is no acute intracranial hemorrhage. No demarcated cortical infarct. No extra-axial fluid collection. No evidence of an intracranial mass. No midline shift. Vascular: No hyperdense vessel.  Atherosclerotic calcifications. Skull: Normal. Negative for fracture or focal lesion. Sinuses/Orbits: Visualized orbits show no acute finding. Mild mucosal thickening within the bilateral ethmoid and right sphenoid sinuses. ASPECTS ([place] Stroke Program Early CT Score) - Ganglionic level infarction (caudate, lentiform nuclei, internal capsule, insula, M1-M3 cortex): 7 - Supraganglionic infarction (M4-M6 cortex): 3 Total score (0-10 with 10 being normal): 10 These results were communicated to Dr. Rory Percy At 11:09 amon 12/9/2022by text page via the [facility] messaging system. IMPRESSION: No evidence of acute intracranial abnormality. ASPECTS is 10. Mild generalized cerebral atrophy. Mild bilateral ethmoid and right maxillary sinus mucosal thickening. Electronically Signed   By: Kellie Simmering D.O.   On: 05/20/2021 11:11   CT ANGIO HEAD NECK W WO CM W PERF (CODE STROKE)  Result Date: 05/20/2021 CLINICAL DATA:  Stroke suspected EXAM: CT ANGIOGRAPHY HEAD AND NECK TECHNIQUE: Multidetector CT imaging of the head and neck was performed using the standard protocol during bolus administration of  intravenous contrast. Multiplanar CT image reconstructions and MIPs were  obtained to evaluate the vascular anatomy. Carotid stenosis measurements (when applicable) are obtained utilizing NASCET criteria, using the distal internal carotid diameter as the denominator. CONTRAST:  56mL OMNIPAQUE IOHEXOL 350 MG/ML SOLN COMPARISON:  No prior CTA, correlation is made with same day CT head. FINDINGS: CT HEAD FINDINGS For noncontrast findings, please see same day CT head. CTA NECK FINDINGS Aortic arch: Standard branching. Imaged portion shows no evidence of aneurysm or dissection. No significant stenosis of the major arch vessel origins. Aortic atherosclerosis. The main pulmonary artery measures up to 3.4 cm, above the upper limit of normal, as can be seen in pulmonary hypertension. Right carotid system: No evidence of dissection, stenosis (50% or greater) or occlusion. Left carotid system: Approximately 50% stenosis in the proximal left internal carotid artery, secondary to calcified plaque. No occlusion or dissection. Vertebral arteries: Right dominant. No evidence of dissection, stenosis (50% or greater) or occlusion. Scattered calcifications in the V1 and V2 segments Skeleton: No acute osseous abnormality. Degenerative changes in the cervical spine. Other neck: The patient is intubated.  Otherwise negative. Upper chest: Atelectasis.  Emphysema. Review of the MIP images confirms the above findings CTA HEAD FINDINGS Anterior circulation: Both internal carotid arteries are patent to the termini, with mild calcifications but without significant stenosis. A1 segments patent. Normal anterior communicating artery. Anterior cerebral arteries are patent to their distal aspects. No M1 stenosis or occlusion. Normal MCA bifurcations. Distal MCA branches perfused and symmetric. Posterior circulation: Vertebral arteries patent to the vertebrobasilar junction without stenosis. Posterior inferior cerebral arteries patent bilaterally. Basilar patent to its distal aspect. Superior cerebellar arteries patent  bilaterally. PCAs perfused to their distal aspects without stenosis. The bilateral posterior communicating arteries are not visualized. Venous sinuses: As permitted by contrast timing, patent. Anatomic variants: None significant. Review of the MIP images confirms the above findings IMPRESSION: 1. No intracranial large vessel occlusion or significant stenosis. 2. Approximately 50% stenosis in the proximal left ICA. No other hemodynamically significant stenosis in the neck. 3. Enlargement of the main pulmonary artery, as can be seen in the setting of pulmonary hypertension. 4. Aortic Atherosclerosis (ICD10-I70.0) and Emphysema (ICD10-J43.9). Electronically Signed   By: Merilyn Baba M.D.   On: 05/20/2021 11:36     Impression     Elevated liver enzymes Normal one year ago.  ALP and total bili normal.   AST 303-->871-->593 and ALT 272-->709-->700 here. Negative acute hepatitis panel CT status post cholecystectomy, normal liver parenchyma, normal biliary ducts. No evidence of cholestasis, normal T bili and alk phos 2 months of prodrome, nausea, vomiting, fatigue. Patient on statin, currently on hold. Possible asterixis on exam, with confusion prior to admission in unknown cause of obtundation Albumin 3.4, platelets 269, INR 1.2 10/14/2019 Thyroid 0.762 Denies use of ETOH, NSAIDS or OTC herbal products. Denies recent antibiotic use. Off statins, no evidence of other medications for DILI Possible ischemic component if hypotension from illness but BP in ER 123/66.  Nausea/emesis/fatigue/confusion upper endoscopy 2021 without clear evidence of bleeding. Gastric canister from OG/NG tube with possible red tinge secretions On Protonix 40 mg outpatient. BUN 29 on admission, last known BUN 10/17/2019 at 11.   NSTEMI (non-ST elevated myocardial infarction) [facility]) Cath today showing stable non obstructive CAD, without intervention, moderate AS, suspected demand ischemia.  Continuing on heparin in the  hospital. .   Acute respiratory failure with hypoxia (HCC) Currently on ventilation.  Became obtunded after cardiac catheterization negative CT angio  for stroke, no sedating medications.   Iron deficiency anemia due to chronic blood loss Colonoscopy 2019, upper endoscopy 2021 without clear evidence of bleeding. Hemoglobin 11.6   HTN (hypertension)  AAA (abdominal aortic aneurysm)  GERD (gastroesophageal reflux disease)   Plan    Will get labs autoimmune hepatitis, primary biliary cholangitis, alpha 1 antitrypsin. Will get ammonia  with confusion and possible astrexis Will order PT/INR to ensure no coagulopathy. Continue to trend liver functions Can consider endoscopy when patient is more stable  Will discuss further plans with Dr. Loletha Carrow  Thank you for your kind consultation, we will continue to follow.  Vladimir Crofts  05/20/2021, 1:30 PM  I have reviewed the entire case in detail with the above APP and discussed the plan in detail.  Therefore, I agree with the diagnoses recorded above. In addition,  I have personally interviewed and examined the patient and have personally reviewed any abdominal/pelvic CT scan images.  My additional thoughts are as follows:  I examined this patient in the ICU and spoke extensively with his wife.  He has regained consciousness, he is alert and following commands on the ventilator.  Puzzling presentation overall with acute substernal chest pain, hypoxia, troponin over thousand and concerns for acute coronary syndrome. He has a significant transaminitis which is already peaked and starting to decrease.  CT abdomen does not show any acute ischemic issue in the small bowel or colon.  Lactate was minimally elevated at 1.5. No apparent acute thrombosis in hepatic/perihepatic vessels.  In all, this still seems to be some acute already a vascular or pulmonary event, and I am concerned he could have had a pulmonary embolism. His neck veins appear  elevated on exam, but I do not know if that is a new finding.  Overall presentation does not seem typical for cardiac tamponade.  In sum, he has an acute transaminitis that I believe is secondary to some other systemic process, possibly precipitated by hypoxia or prehospital hypotension.  I do not think this is an acute upper digestive or hepatic condition causing all of this.  Follow liver labs, I expect they will continue to steadily improve.  He is showing signs of improvement with improvement in mental status and currently stable vital signs.  I spoke with Dr. Lamonte Sakai of the critical care service, who will reevaluate the patient and consider further testing.  We will follow.   Nelida Meuse III Office:[phone]

## 2021-05-20 NOTE — Progress Notes (Signed)
Discussed case with Dr. Lamonte Sakai. Potential concern for PE as a possible cause of his troponin elevation, CP, hypoxia, elecated transaminases. Already received contrast for LHC today, so will defer CTA until tomorrow. LE dopplers, d-dimer, GGT levels pending. Will order empiric lovenox overnight to allow assessment after CT tomorrow.   Julian Hy, DO 05/20/21 6:02 PM [facility]

## 2021-05-20 NOTE — Significant Event (Addendum)
Rapid Response Event Note   Reason for Call :  Unresponsive He has just arrived from the cath lab  Initial Focused Assessment:  Patient is lying in bed. He will open his eyes to painful stim, but otherwise he is completely unresponsive.  He is warm to the touch. He has no gag reflex.  BP 153/73  HR 92  RR 18  O2 sat 93% on [place] CBG 171  Last known well unclear, per wife he was "too sleepy" to take his pill before he went to the cathlab this morning.  He was able to stand and use the bathroom during the night.  He seemed to be his "normal self"  She states that he has been getting weaker over the past couple of months.    He has a history of polio: right arm atrophy, and is flaccid  Dr Bonner Puna is  at bedside Code Stroke called at 1010 concern for posterior LVO.  Dr Rory Percy at bedside CCM consulted:  Dr Lamonte Sakai and Remo Lipps Minor NP at bedside  2 PIVs left arm  18 ga and 20 ga  Interventions:  Code Stroke called NS infusing  CCM intubated patient (difficult airway) 20 Etomidate, 50 Fentanyl given IV 100 Rocuronium and 2mg  Versed given IV  PCXR done for ETT placement: ETT adjusted per orders  PCXR done  CT, CTA head and neck and CTP done Code Stroke canceled  CTA abdomen/pelvis also done  Transported to [place], CVICU staff at bedside to receive patient, bedside report given.     Plan of Care:  Per Dr Rory Percy: Frequent neuro checks for next 4 hours then per department routine.   Event Summary:   MD Notified: Dr Bonner Puna at bedside Call Time: [place] Time: 1008 End Time: [place]  Raliegh Ip, RN

## 2021-05-20 NOTE — Progress Notes (Signed)
Sheath pull: 46fr @ 902 Right femoral artery Site is clean, dry, no bruising, no hematoma @ 902  Dressing: gauze and tegaderm Pressure held: 21 mins Bedrest starts: 0925  Site is clean, dry, no hematoma present @0928 

## 2021-05-20 NOTE — Interval H&P Note (Signed)
History and Physical Interval Note:  05/20/2021 7:37 AM  [name]  has presented today for surgery, with the diagnosis of nstemi.  The various methods of treatment have been discussed with the patient and family. After consideration of risks, benefits and other options for treatment, the patient has consented to  Procedure(s): LEFT HEART CATH AND CORONARY ANGIOGRAPHY (N/A)  PERCUTANEOUS CORONARY INTERVENTION   as a surgical intervention.  The patient's history has been reviewed, patient examined, no change in status, stable for surgery.  I have reviewed the patient's chart and labs.  Questions were answered to the patient's satisfaction.    Cath Lab Visit (complete for each Cath Lab visit)  Clinical Evaluation Leading to the Procedure:   ACS: Yes.    Non-ACS:    Anginal Classification: CCS III - ATYPICAL   Anti-ischemic medical therapy: No Therapy  Non-Invasive Test Results: No non-invasive testing performed  Prior CABG: No previous CABG    Glenetta Hew

## 2021-05-20 NOTE — Progress Notes (Signed)
Pharmacy Antibiotic Note  [name] is a 80 y.o. male admitted on 05/19/2021 with pneumonia.  Pharmacy has been consulted for Vancomycin/cefepime dosing.  Scr 0.48 (rounded to 0.8) mg/dL  Plan: Vancomycin 2000mg  LD x1 followed by vancomycin 1250mg  q24hr (eAUC 454)  Cefepime 2gm q8hr MRSA nares ordered  Height: 5\' 6"  (167.6 cm) Weight: 92.4 kg (203 lb 11.3 oz) IBW/kg (Calculated) : 63.8  Temp (24hrs), Avg:100.1 F (37.8 C), Min:98.7 F (37.1 C), Max:102.7 F (39.3 C)  Recent Labs  Lab 05/19/21 1151 05/19/21 1403 05/20/21 0054 05/20/21 1032 05/20/21 1043  WBC 14.6*  --  15.2* 15.9*  --   CREATININE 1.13  --  0.67 0.49* 0.48*  LATICACIDVEN  --  1.5  --   --   --     Estimated Creatinine Clearance: 78.3 mL/min (A) (by C-G formula based on SCr of 0.48 mg/dL (L)).    Allergies  Allergen Reactions   Tramadol Other (See Comments)    Excessive sleepiness/difficulty waking patient    Antimicrobials this admission:  Vancomycin 12/9 >>  Cefepime 12/9 >>   Dose adjustments this admission:   Microbiology results: 12/9 BCx: IP 12/9 MRSA PCR: IP  Thank you for allowing pharmacy to be a part of this patient's care.  Donnald Garre, PharmD Clinical Pharmacist  Please check AMION for all [place] numbers After 10:00 PM, call [place] [phone]

## 2021-05-20 NOTE — Plan of Care (Signed)
°  Problem: Clinical Measurements: °Goal: Will remain free from infection °Outcome: Progressing °Goal: Respiratory complications will improve °Outcome: Progressing °Goal: Cardiovascular complication will be avoided °Outcome: Progressing °  °Problem: Activity: °Goal: Risk for activity intolerance will decrease °Outcome: Progressing °  °Problem: Nutrition: °Goal: Adequate nutrition will be maintained °Outcome: Progressing °  °Problem: Coping: °Goal: Level of anxiety will decrease °Outcome: Progressing °  °Problem: Elimination: °Goal: Will not experience complications related to bowel motility °Outcome: Progressing °Goal: Will not experience complications related to urinary retention °Outcome: Progressing °  °Problem: Pain Managment: °Goal: General experience of comfort will improve °Outcome: Progressing °  °Problem: Safety: °Goal: Ability to remain free from injury will improve °Outcome: Progressing °  °Problem: Skin Integrity: °Goal: Risk for impaired skin integrity will decrease °Outcome: Progressing °  °

## 2021-05-20 NOTE — Progress Notes (Signed)
Initial Nutrition Assessment  DOCUMENTATION CODES:   Not applicable  INTERVENTION:   Recommend initiation of TF once medically able if remains on vent support  Tube Feeding Recommendations per OG once advanced and appropriate for TF:  Vital 1.5 at 50 ml/hr Pro-Source TF 45 mL BID Provides 103 g of protein, 1880 kcals, 912 mL of free water   NUTRITION DIAGNOSIS:   Inadequate oral intake related to acute illness as evidenced by NPO status.  GOAL:   Patient will meet greater than or equal to 90% of their needs   MONITOR:   Vent status, Weight trends, TF tolerance, Labs  REASON FOR ASSESSMENT:   Ventilator    ASSESSMENT:   80 yo male admitted with nausea, vomiting, abdominal pain and worsening GERD with fatigue and confusion with elevated liver enzymes, NSTEMI, obtunded requiring intubation. PMH includes CAD, GERD, HTN, HLD, iron def anemia, AAA s/p repair, bladder ca s/p TURBT, post-polio right sided atrophy  12/09 Card Cath, Code Stroke, Intubated  Pt currently sedated on vent support. GI consulted and work-up ongoing  Diffuse gaseous distention of intestines, OG tube with tip at GE juction-needs to be advanced prior to use. RN notified CT abdomen with no acute findings  +N/V, constipation, abdominal pain PTA. OG with bloody output  Current wt 92.4 kg; noted weight of 89.4 kg in May of this year. Unable to obtain diet and weight history from patient at this time  Labs: reviewed Meds:  ss novolog    Diet Order:   Diet Order             Diet Heart Room service appropriate? Yes; Fluid consistency: Thin  Diet effective now                   EDUCATION NEEDS:   Not appropriate for education at this time  Skin:  Skin Assessment: Reviewed RN Assessment  Last BM:  no documented BM  Height:   Ht Readings from Last 1 Encounters:  05/19/21 5\' 6"  (1.676 m)    Weight:   Wt Readings from Last 1 Encounters:  05/20/21 92.4 kg    BMI:  Body mass  index is 32.88 kg/m.  Estimated Nutritional Needs:   Kcal:  1800-2000 kcals  Protein:  95-115 g  Fluid:  >/= 1.8 L   Kerman Passey MS, RDN, LDN, CNSC Registered Dietitian III Clinical Nutrition RD Pager and On-Call Pager Number Located in [place]

## 2021-05-21 ENCOUNTER — Inpatient Hospital Stay (HOSPITAL_COMMUNITY): Payer: Medicare HMO

## 2021-05-21 ENCOUNTER — Other Ambulatory Visit: Payer: Self-pay

## 2021-05-21 DIAGNOSIS — I214 Non-ST elevation (NSTEMI) myocardial infarction: Secondary | ICD-10-CM

## 2021-05-21 DIAGNOSIS — J9601 Acute respiratory failure with hypoxia: Secondary | ICD-10-CM | POA: Diagnosis not present

## 2021-05-21 DIAGNOSIS — G9340 Encephalopathy, unspecified: Secondary | ICD-10-CM | POA: Diagnosis not present

## 2021-05-21 LAB — CBC WITH DIFFERENTIAL/PLATELET
Abs Immature Granulocytes: 0.14 10*3/uL — ABNORMAL HIGH (ref 0.00–0.07)
Basophils Absolute: 0.1 10*3/uL (ref 0.0–0.1)
Basophils Relative: 1 %
Eosinophils Absolute: 0 10*3/uL (ref 0.0–0.5)
Eosinophils Relative: 0 %
HCT: 30.8 % — ABNORMAL LOW (ref 39.0–52.0)
Hemoglobin: 8.2 g/dL — ABNORMAL LOW (ref 13.0–17.0)
Immature Granulocytes: 1 %
Lymphocytes Relative: 11 %
Lymphs Abs: 1.5 10*3/uL (ref 0.7–4.0)
MCH: 21.1 pg — ABNORMAL LOW (ref 26.0–34.0)
MCHC: 26.6 g/dL — ABNORMAL LOW (ref 30.0–36.0)
MCV: 79.4 fL — ABNORMAL LOW (ref 80.0–100.0)
Monocytes Absolute: 1.6 10*3/uL — ABNORMAL HIGH (ref 0.1–1.0)
Monocytes Relative: 12 %
Neutro Abs: 10.5 10*3/uL — ABNORMAL HIGH (ref 1.7–7.7)
Neutrophils Relative %: 75 %
Platelets: 249 10*3/uL (ref 150–400)
RBC: 3.88 MIL/uL — ABNORMAL LOW (ref 4.22–5.81)
RDW: 17.1 % — ABNORMAL HIGH (ref 11.5–15.5)
WBC: 13.9 10*3/uL — ABNORMAL HIGH (ref 4.0–10.5)
nRBC: 0.4 % — ABNORMAL HIGH (ref 0.0–0.2)

## 2021-05-21 LAB — COMPREHENSIVE METABOLIC PANEL
ALT: 554 U/L — ABNORMAL HIGH (ref 0–44)
AST: 295 U/L — ABNORMAL HIGH (ref 15–41)
Albumin: 3.1 g/dL — ABNORMAL LOW (ref 3.5–5.0)
Alkaline Phosphatase: 65 U/L (ref 38–126)
Anion gap: 10 (ref 5–15)
BUN: 30 mg/dL — ABNORMAL HIGH (ref 8–23)
CO2: 33 mmol/L — ABNORMAL HIGH (ref 22–32)
Calcium: 8.5 mg/dL — ABNORMAL LOW (ref 8.9–10.3)
Chloride: 97 mmol/L — ABNORMAL LOW (ref 98–111)
Creatinine, Ser: 0.76 mg/dL (ref 0.61–1.24)
GFR, Estimated: >60 mL/min (ref >60)
Glucose, Bld: 107 mg/dL — ABNORMAL HIGH (ref 70–99)
Potassium: 3.2 mmol/L — ABNORMAL LOW (ref 3.5–5.1)
Sodium: 140 mmol/L (ref 135–145)
Total Bilirubin: 1.5 mg/dL — ABNORMAL HIGH (ref 0.3–1.2)
Total Protein: 5.9 g/dL — ABNORMAL LOW (ref 6.5–8.1)

## 2021-05-21 LAB — GLUCOSE, CAPILLARY
Glucose-Capillary: 120 mg/dL — ABNORMAL HIGH (ref 70–99)
Glucose-Capillary: 113 mg/dL — ABNORMAL HIGH (ref 70–99)
Glucose-Capillary: 108 mg/dL — ABNORMAL HIGH (ref 70–99)
Glucose-Capillary: 103 mg/dL — ABNORMAL HIGH (ref 70–99)

## 2021-05-21 LAB — MRSA NEXT GEN BY PCR, NASAL: MRSA by PCR Next Gen: NOT DETECTED

## 2021-05-21 MED ORDER — FENTANYL 2500MCG IN NS 250ML (10MCG/ML) PREMIX INFUSION
25.0000 ug/h | INTRAVENOUS | Status: DC
  Administered 2021-05-21: 50 ug/h via INTRAVENOUS
  Filled 2021-05-21: qty 250

## 2021-05-21 MED ORDER — ORAL CARE MOUTH RINSE: 15.0000 mL | Freq: Two times a day (BID) | OROMUCOSAL | Status: DC

## 2021-05-21 MED ORDER — ALBUTEROL SULFATE (2.5 MG/3ML) 0.083% IN NEBU: 2.5000 mg | INHALATION_SOLUTION | Freq: Four times a day (QID) | RESPIRATORY_TRACT | Status: DC | PRN

## 2021-05-21 MED ORDER — FENTANYL CITRATE PF 50 MCG/ML IJ SOSY
25.0000 ug | PREFILLED_SYRINGE | Freq: Once | INTRAMUSCULAR | Status: AC
  Administered 2021-05-21: 25 ug via INTRAVENOUS
  Filled 2021-05-21: qty 1

## 2021-05-21 MED ORDER — HEPARIN SODIUM (PORCINE) 5000 UNIT/ML IJ SOLN
5000.0000 [IU] | Freq: Three times a day (TID) | INTRAMUSCULAR | Status: DC
  Administered 2021-05-21 – 2021-05-27 (×18): 5000 [IU] via SUBCUTANEOUS
  Filled 2021-05-21 (×18): qty 1

## 2021-05-21 MED ORDER — DOCUSATE SODIUM 100 MG PO CAPS
100.0000 mg | ORAL_CAPSULE | Freq: Two times a day (BID) | ORAL | Status: DC
  Administered 2021-05-21 – 2021-05-27 (×12): 100 mg via ORAL
  Filled 2021-05-21 (×12): qty 1

## 2021-05-21 MED ORDER — POTASSIUM CHLORIDE 20 MEQ PO PACK: 20.0000 meq | PACK | ORAL | Status: DC

## 2021-05-21 MED ORDER — POLYETHYLENE GLYCOL 3350 17 G PO PACK
17.0000 g | PACK | Freq: Every day | ORAL | Status: DC
  Administered 2021-05-21 – 2021-05-22 (×2): 17 g
  Filled 2021-05-21 (×2): qty 1

## 2021-05-21 MED ORDER — IOHEXOL 350 MG/ML SOLN
100.0000 mL | Freq: Once | INTRAVENOUS | Status: AC | PRN
  Administered 2021-05-21: 100 mL via INTRAVENOUS

## 2021-05-21 MED ORDER — ORAL CARE MOUTH RINSE
15.0000 mL | Freq: Two times a day (BID) | OROMUCOSAL | Status: DC
  Administered 2021-05-22 – 2021-05-27 (×10): 15 mL via OROMUCOSAL

## 2021-05-21 MED ORDER — ASPIRIN 325 MG PO TABS
325.0000 mg | ORAL_TABLET | Freq: Every day | ORAL | Status: DC
Start: 2021-05-22 — End: 2021-05-22
  Administered 2021-05-21 – 2021-05-22 (×2): 325 mg
  Filled 2021-05-21 (×2): qty 1

## 2021-05-21 MED ORDER — POTASSIUM CHLORIDE 10 MEQ/100ML IV SOLN
10.0000 meq | INTRAVENOUS | Status: DC
  Administered 2021-05-21: 10 meq via INTRAVENOUS
  Filled 2021-05-21: qty 100

## 2021-05-21 MED ORDER — TRAZODONE HCL 50 MG PO TABS: 25.0000 mg | ORAL_TABLET | Freq: Every evening | ORAL | Status: DC | PRN

## 2021-05-21 MED ORDER — FENTANYL BOLUS VIA INFUSION
25.0000 ug | INTRAVENOUS | Status: DC | PRN
  Administered 2021-05-21: 50 ug via INTRAVENOUS
  Filled 2021-05-21: qty 100

## 2021-05-21 MED ORDER — CHLORHEXIDINE GLUCONATE 0.12 % MT SOLN
OROMUCOSAL | Status: AC
  Administered 2021-05-21: 15 mL
  Filled 2021-05-21: qty 15

## 2021-05-21 MED ORDER — POTASSIUM CHLORIDE 20 MEQ PO PACK
40.0000 meq | PACK | ORAL | Status: AC
  Administered 2021-05-21 (×2): 40 meq
  Filled 2021-05-21 (×2): qty 2

## 2021-05-21 MED ORDER — POTASSIUM CHLORIDE 10 MEQ/100ML IV SOLN: 10.0000 meq | INTRAVENOUS | Status: DC

## 2021-05-21 MED ORDER — DOCUSATE SODIUM 50 MG/5ML PO LIQD
100.0000 mg | Freq: Two times a day (BID) | ORAL | Status: DC
  Administered 2021-05-21: 100 mg
  Filled 2021-05-21: qty 10

## 2021-05-21 NOTE — Procedures (Signed)
Extubation Procedure Note  Patient Details:   Name: [name] DOB: 04-20-1941 MRN: [id number]   Airway Documentation:    Vent end date: 05/21/21 Vent end time: 1242   Evaluation  O2 sats: stable throughout Complications: No apparent complications Patient did tolerate procedure well. Bilateral Breath Sounds: Clear, Diminished   Yes  Patient had a cuff leak prior to extubation. Patient does not have any stridor at this time and is able to speak.   Lynann Bologna 05/21/2021, 2:34 PM

## 2021-05-21 NOTE — Progress Notes (Signed)
Patient transported to CT and back without complications.  

## 2021-05-21 NOTE — Plan of Care (Signed)
  Problem: Activity: Goal: Ability to return to baseline activity level will improve Outcome: Progressing   Problem: Cardiovascular: Goal: Ability to achieve and maintain adequate cardiovascular perfusion will improve Outcome: Progressing Goal: Vascular access site(s) Level 0-1 will be maintained Outcome: Progressing   Problem: Health Behavior/Discharge Planning: Goal: Ability to safely manage health-related needs after discharge will improve Outcome: Progressing   Problem: Education: Goal: Knowledge of General Education information will improve Description: Including pain rating scale, medication(s)/side effects and non-pharmacologic comfort measures Outcome: Progressing   Problem: Health Behavior/Discharge Planning: Goal: Ability to manage health-related needs will improve Outcome: Progressing   Problem: Clinical Measurements: Goal: Ability to maintain clinical measurements within normal limits will improve Outcome: Progressing Goal: Will remain free from infection Outcome: Progressing Goal: Diagnostic test results will improve Outcome: Progressing Goal: Respiratory complications will improve Outcome: Progressing Goal: Cardiovascular complication will be avoided Outcome: Progressing   Problem: Activity: Goal: Risk for activity intolerance will decrease Outcome: Progressing   Problem: Nutrition: Goal: Adequate nutrition will be maintained Outcome: Not Progressing Note: Pt extubated today, tolerating sips and chips, no evidence of aspiration, precautions in place, will likely advance diet tomorrow   Problem: Coping: Goal: Level of anxiety will decrease Outcome: Progressing   Problem: Elimination: Goal: Will not experience complications related to bowel motility Outcome: Not Progressing Note: Pt is on stool softeners, will monitor, pt states constipation is an issue for him at baseline Goal: Will not experience complications related to urinary retention Outcome:  Progressing   Problem: Pain Managment: Goal: General experience of comfort will improve Outcome: Progressing   Problem: Safety: Goal: Ability to remain free from injury will improve Outcome: Progressing   Problem: Skin Integrity: Goal: Risk for impaired skin integrity will decrease Outcome: Progressing

## 2021-05-21 NOTE — Consult Note (Signed)
NAME:  [name], MRN:  [id number], DOB:  10/09/1940, LOS: 2 ADMISSION DATE:  05/19/2021, CONSULTATION DATE: 05/18/2021 REFERRING MD: Neurology, CHIEF COMPLAINT: Altered mental status requiring intubation  History of Present Illness:  80 year old male who was transferred down to [facility] non-ST elevation MI with cardiac catheterization performed on 05/20/2021 with no acute lesions.  Following cardiac cath has decreased level of consciousness and required intubation and a code stroke being called.  Pulmonary critical care called to the bedside patient was intubated per Dr. Lamonte Sakai.  Airway has been flagged as difficult.  Patient be transferred to the intensive care unit for further evaluation and treatment. .  Pertinent  Medical History   Past Medical History:  Diagnosis Date   AAA (abdominal aortic aneurysm)    Bladder cancer ([place])    s/p TURBT 09/26/19   Blood transfusion without reported diagnosis    Coronary artery disease    stents placed   GERD (gastroesophageal reflux disease)    HOH (hard of hearing)    Hyperlipidemia    Hypertension    Iron deficiency anemia 05/14/2018   Polio    hx of. right arm atrophy     Significant Hospital Events: Including procedures, antibiotic start and stop dates in addition to other pertinent events   05/20/2021 status post cardiac catheterization with no acute lesions 05/20/2021 decreased level consciousness required intubation 05/20/2021 code stroke 12/10 CTPE study negative   Interim History / Subjective:  This morning awake, following commands, family at bedside. Passing SBT Objective   Blood pressure 125/62, pulse 64, temperature 98 F (36.7 C), temperature source Oral, resp. rate 18, height 5\' 6"  (1.676 m), weight 90.7 kg, SpO2 98 %.    Vent Mode: PRVC FiO2 (%):  [40 %-100 %] 40 % Set Rate:  [20 bmp] 20 bmp Vt Set:  [510 mL] 510 mL PEEP:  [5 cmH20] 5 cmH20 Plateau Pressure:  [17 cmH20] 17 cmH20   Intake/Output Summary  (Last 24 hours) at 05/21/2021 0851 Last data filed at 05/21/2021 0700 Gross per 24 hour  Intake 788.05 ml  Output 1900 ml  Net -1111.95 ml   Filed Weights   05/19/21 1412 05/20/21 0315 05/21/21 0300  Weight: 88.9 kg 92.4 kg 90.7 kg    Examination: Gen:      Intubated, sedated, acutely ill appearing HEENT:  ETT to vent Lungs:    sounds of mechanical ventilation auscultated ctab CV:         RRR no mrg Abd:      + bowel sounds; soft, non-tender; no palpable masses, no distension Ext:    No edema Skin:      Warm and dry; no rashes Neuro:   sedated, RASS 0 , moves all 4 extremities   Labs/imaging CT head negative for acute process CT A/P negative for RP bleed  Na 140 K 3.2 Cr 0.76   Resolved Hospital Problem list   encephalopathy  Assessment & Plan:  Dependent respiratory failure secondary to recent non-ST elevation MI suspected stroke with decreasing level of consciousness requiring intubation 05/20/2021 Unclear etiology for encephalopathy and respiratory failure. Possibly mild hypercapnia Ventilator bundle Wean per protocol; will SBT and extubate today  Cardiac cath performed on 05/20/2021 without acute lesion in the setting of non-ST elevation MI known heart failure Per cardiology  Acute on chronic Anemia Received treatment dose lovenox x1 CPTE study negative, will transition to ppx dosing heparin CT A/P negative for RP bleed  Hypokalemia - replaced enterally as no  central access - repeat BMET in am  Best Practice (right click and "Reselect all SmartList Selections" daily)   Diet/type: NPO DVT prophylaxis: not indicated GI prophylaxis: PPI Lines: N/A Foley:  N/A Code Status:  full code Last date of multidisciplinary goals of care discussion - updated family at bedside  Labs   CBC: Recent Labs  Lab 05/19/21 1151 05/20/21 0054 05/20/21 1032 05/20/21 1222 05/21/21 0330  WBC 14.6* 15.2* 15.9*  --  13.9*  NEUTROABS 11.3* 11.4*  --   --  10.5*  HGB  10.3* 9.1* 8.9* 11.6* 8.2*  HCT 39.6 34.6* 34.5* 34.0* 30.8*  MCV 82.3 80.5 83.1  --  79.4*  PLT 362 297 269  --  712    Basic Metabolic Panel: Recent Labs  Lab 05/19/21 1151 05/20/21 0054 05/20/21 1032 05/20/21 1043 05/20/21 1222 05/21/21 0330  NA 136 136 136 135 138 140  K 4.5 4.3 5.0 5.0 4.2 3.2*  CL 95* 96* 97* 97*  --  97*  CO2 32 36* 35* 34*  --  33*  GLUCOSE 156* 131* 173* 171*  --  107*  BUN 29* 24* 25* 24*  --  30*  CREATININE 1.13 0.67 0.49* 0.48*  --  0.76  CALCIUM 9.1 8.5* 7.9* 7.9*  --  8.5*  MG  --  2.0  --   --   --   --    GFR: Estimated Creatinine Clearance: 77.7 mL/min (by C-G formula based on SCr of 0.76 mg/dL). Recent Labs  Lab 05/19/21 1151 05/19/21 1403 05/20/21 0054 05/20/21 1032 05/21/21 0330  WBC 14.6*  --  15.2* 15.9* 13.9*  LATICACIDVEN  --  1.5  --   --   --     Liver Function Tests: Recent Labs  Lab 05/19/21 1151 05/20/21 0054 05/20/21 1032 05/21/21 0330  AST 303* 871* 593* 295*  ALT 272* 709* 700* 554*  ALKPHOS 81 72 75 65  BILITOT 0.7 0.6 0.7 1.5*  PROT 8.0 6.5 6.4* 5.9*  ALBUMIN 4.2 3.5 3.4* 3.1*   Recent Labs  Lab 05/19/21 1151  LIPASE 32   No results for input(s): AMMONIA in the last 168 hours.  ABG    Component Value Date/Time   PHART 7.362 05/20/2021 1222   PCO2ART 60.2 (H) 05/20/2021 1222   PO2ART 70 (L) 05/20/2021 1222   HCO3 34.2 (H) 05/20/2021 1222   TCO2 36 (H) 05/20/2021 1222   ACIDBASEDEF 1.0 10/14/2019 1406   O2SAT 92.0 05/20/2021 1222     Coagulation Profile: No results for input(s): INR, PROTIME in the last 168 hours.  Cardiac Enzymes: No results for input(s): CKTOTAL, CKMB, CKMBINDEX, TROPONINI in the last 168 hours.  HbA1C: Hgb A1c MFr Bld  Date/Time Value Ref Range Status  05/19/2021 11:51 AM 6.4 (H) 4.8 - 5.6 % Final    Comment:    (NOTE) Pre diabetes:          5.7%-6.4%  Diabetes:              >6.4%  Glycemic control for   <7.0% adults with diabetes     CBG: Recent Labs   Lab 05/20/21 1142 05/20/21 1217 05/20/21 1838 05/20/21 2222 05/21/21 0656  GLUCAP 140* 158* 97 126* 120*     Critical care time: 44 min   The patient is critically ill due to respiratory failure.  Critical care was necessary to treat or prevent imminent or life-threatening deterioration.  Critical care was time spent personally by me on the following  activities: development of treatment plan with patient and/or surrogate as well as nursing, discussions with consultants, evaluation of patient's response to treatment, examination of patient, obtaining history from patient or surrogate, ordering and performing treatments and interventions, ordering and review of laboratory studies, ordering and review of radiographic studies, pulse oximetry, re-evaluation of patient's condition and participation in multidisciplinary rounds.   Critical Care Time devoted to patient care services described in this note is 44 minutes. This time reflects time of care of this [place] . This critical care time does not reflect separately billable procedures or procedure time, teaching time or supervisory time of PA/NP/Med student/Med Resident etc but could involve care discussion time.       Spero Geralds [place] [facility] 05/21/2021 12:18 PM  Pager: see AMION  If no response to pager , please call critical care on call (see AMION) until 7pm After 7:00 pm call [place]

## 2021-05-21 NOTE — Progress Notes (Signed)
Progress Note  Patient Name: [name] Date of Encounter: 05/21/2021  Primary Cardiologist:   [name], MD   Subjective   He is wide awake.  Denies pain or SOB.   Inpatient Medications    Scheduled Meds:  aspirin  325 mg Oral Daily   chlorhexidine gluconate (MEDLINE KIT)  15 mL Mouth Rinse BID   Chlorhexidine Gluconate Cloth  6 each Topical Daily   docusate  100 mg Per Tube BID   enoxaparin (LOVENOX) injection  1 mg/kg Subcutaneous BID   insulin aspart  0-9 Units Subcutaneous TID WC   mouth rinse  15 mL Mouth Rinse 10 times per day   pantoprazole (PROTONIX) IV  40 mg Intravenous Q12H   polyethylene glycol  17 g Per Tube Daily   potassium chloride  40 mEq Per Tube Q2H   sodium chloride flush  3 mL Intravenous Q12H   sodium chloride flush  3 mL Intravenous Q12H   Continuous Infusions:  sodium chloride 10 mL/hr at 05/21/21 0700   ceFEPime (MAXIPIME) IV Stopped (05/21/21 0441)   fentaNYL infusion INTRAVENOUS 100 mcg/hr (05/21/21 0700)   vancomycin     PRN Meds: sodium chloride, albuterol, bisacodyl, fentaNYL, ibuprofen, nitroGLYCERIN, ondansetron **OR** ondansetron (ZOFRAN) IV, sodium chloride flush, traZODone   Vital Signs    Vitals:   05/21/21 0600 05/21/21 0700 05/21/21 0812 05/21/21 0835  BP: 115/62 125/62 (!) 109/54   Pulse: (!) 57 64 62   Resp: 20 18 (!) 22   Temp:    98 F (36.7 C)  TempSrc:    Oral  SpO2: 98% 98% 100%   Weight:      Height:        Intake/Output Summary (Last 24 hours) at 05/21/2021 0920 Last data filed at 05/21/2021 0700 Gross per 24 hour  Intake 788.05 ml  Output 1900 ml  Net -1111.95 ml   Filed Weights   05/19/21 1412 05/20/21 0315 05/21/21 0300  Weight: 88.9 kg 92.4 kg 90.7 kg    Telemetry    NSR - Personally Reviewed  ECG    NA - Personally Reviewed  Physical Exam   GEN: No acute distress.   Neck: No  JVD Cardiac: RRR, 3/6 apical systolic murmur, no diastolic Respiratory: Clear  to auscultation  bilaterally. GI: Soft, nontender, non-distended  MS: No  edema; No deformity.  Right femoral site without bleeding or bruising.  Neuro:  Nonfocal  Psych: Normal affect   Labs    Chemistry Recent Labs  Lab 05/20/21 0054 05/20/21 1032 05/20/21 1043 05/20/21 1222 05/21/21 0330  NA 136 136 135 138 140  K 4.3 5.0 5.0 4.2 3.2*  CL 96* 97* 97*  --  97*  CO2 36* 35* 34*  --  33*  GLUCOSE 131* 173* 171*  --  107*  BUN 24* 25* 24*  --  30*  CREATININE 0.67 0.49* 0.48*  --  0.76  CALCIUM 8.5* 7.9* 7.9*  --  8.5*  PROT 6.5 6.4*  --   --  5.9*  ALBUMIN 3.5 3.4*  --   --  3.1*  AST 871* 593*  --   --  295*  ALT 709* 700*  --   --  554*  ALKPHOS 72 75  --   --  65  BILITOT 0.6 0.7  --   --  1.5*  GFRNONAA >60 >60 >60  --  >60  ANIONGAP 4* 4* 4*  --  10     Hematology Recent  Labs  Lab 05/20/21 0054 05/20/21 1032 05/20/21 1222 05/21/21 0330  WBC 15.2* 15.9*  --  13.9*  RBC 4.30 4.15*  --  3.88*  HGB 9.1* 8.9* 11.6* 8.2*  HCT 34.6* 34.5* 34.0* 30.8*  MCV 80.5 83.1  --  79.4*  MCH 21.2* 21.4*  --  21.1*  MCHC 26.3* 25.8*  --  26.6*  RDW 17.4* 17.3*  --  17.1*  PLT 297 269  --  249    Cardiac EnzymesNo results for input(s): TROPONINI in the last 168 hours. No results for input(s): TROPIPOC in the last 168 hours.   BNP Recent Labs  Lab 05/20/21 0059  BNP 478.2*     DDimer  Recent Labs  Lab 05/20/21 1643  DDIMER 3.32*     Radiology    CT ABDOMEN PELVIS WO CONTRAST  Result Date: 05/20/2021 CLINICAL DATA:  Status post cardiac catheterization this morning, evaluate for retroperitoneal bleed EXAM: CT ABDOMEN AND PELVIS WITHOUT CONTRAST TECHNIQUE: Multidetector CT imaging of the abdomen and pelvis was performed following the standard protocol without IV contrast. COMPARISON:  05/19/2021 FINDINGS: Lower chest: Aortic valve calcifications. Coronary artery calcifications. Small bilateral pleural effusions, increased compared to prior examination. Hepatobiliary: No focal  liver abnormality is seen. Status post cholecystectomy. No biliary dilatation. Pancreas: Unremarkable. No pancreatic ductal dilatation or surrounding inflammatory changes. Spleen: Normal in size without significant abnormality. Adrenals/Urinary Tract: Adrenal glands are unremarkable. Excreted contrast in the collecting systems and urinary bladder, in keeping with same-day coronary catheterization. Kidneys are otherwise normal, without renal calculi, solid lesion, or hydronephrosis. Bladder is unremarkable. Stomach/Bowel: Stomach is within normal limits. Appendix appears normal. No evidence of bowel wall thickening, distention, or inflammatory changes. Vascular/Lymphatic: Aortic atherosclerosis. Status post graft repair of the abdominal aorta. No enlarged abdominal or pelvic lymph nodes. Reproductive: No mass or other significant abnormality. Other: No abdominal wall hernia or abnormality. Subcutaneous soft tissue stranding and pressure dressing overlying the right femoral vessels (series 3, image 89). No abdominopelvic ascites. Musculoskeletal: No acute or significant osseous findings. Chronic pars defects of L5. IMPRESSION: 1. No evidence of retroperitoneal hematoma or other acute noncontrast CT findings in the abdomen or pelvis. 2. Subcutaneous soft tissue stranding and pressure dressing overlying the right femoral vessels, in keeping with catheterization. 3. Small bilateral pleural effusions, increased compared to prior examination. 4. Status post graft repair of the abdominal aorta. Aortic Atherosclerosis (ICD10-I70.0). Electronically Signed   By: Delanna Ahmadi M.D.   On: 05/20/2021 11:34   DG Chest 1 View  Result Date: 05/19/2021 CLINICAL DATA:  Shortness of breath. EXAM: CHEST  1 VIEW COMPARISON:  Chest x-ray from same day at 1222 hours. FINDINGS: Stable cardiomegaly. Unchanged bibasilar atelectasis/scarring. Linear density overlying the peripheral right lung corresponds to a prominent lung marking on CT  from same day. No focal consolidation, pleural effusion, or pneumothorax. No acute osseous abnormality. IMPRESSION: 1. Linear density overlying the peripheral right lung corresponds to a prominent lung marking. No pneumothorax. Electronically Signed   By: Titus Dubin M.D.   On: 05/19/2021 14:41   DG Abd 1 View  Result Date: 05/20/2021 CLINICAL DATA:  OG tube placement. EXAM: ABDOMEN - 1 VIEW COMPARISON:  10/14/2019 FINDINGS: 1153 hours. OG tube tip is at the esophagogastric junction with proximal side port in the distal esophagus. Diffuse gaseous distention of small bowel and colon evident. IMPRESSION: OG tube tip is at the esophagogastric junction with proximal side port in the distal esophagus. Tube could be advanced 9-10 cm to place  the proximal side port below the GE junction. Electronically Signed   By: Misty Stanley M.D.   On: 05/20/2021 12:04   CT ABDOMEN PELVIS W CONTRAST  Result Date: 05/19/2021 CLINICAL DATA:  Acute generalized abdominal pain. EXAM: CT ABDOMEN AND PELVIS WITH CONTRAST TECHNIQUE: Multidetector CT imaging of the abdomen and pelvis was performed using the standard protocol following bolus administration of intravenous contrast. CONTRAST:  138m OMNIPAQUE IOHEXOL 300 MG/ML  SOLN COMPARISON:  September 23, 2019. FINDINGS: Lower chest: Mild bibasilar subsegmental atelectasis is noted. Hepatobiliary: No focal liver abnormality is seen. Status post cholecystectomy. No biliary dilatation. Pancreas: Unremarkable. No pancreatic ductal dilatation or surrounding inflammatory changes. Spleen: Normal in size without focal abnormality. Adrenals/Urinary Tract: Adrenal glands appear normal. Right renal cyst is noted. No hydronephrosis or renal obstruction is noted. No renal or ureteral calculi are noted. Urinary bladder is unremarkable. Stomach/Bowel: Stomach is within normal limits. Appendix appears normal. No evidence of bowel wall thickening, distention, or inflammatory changes.  Vascular/Lymphatic: Status post surgical repair of abdominal aorta. No adenopathy is noted. Reproductive: Prostate is unremarkable. Other: No abdominal wall hernia or abnormality. No abdominopelvic ascites. Musculoskeletal: Minimal grade 1 anterolisthesis is noted secondary to bilateral L5 spondylolysis. IMPRESSION: No acute abnormality seen in the abdomen or pelvis. Aortic Atherosclerosis (ICD10-I70.0). Electronically Signed   By: JMarijo ConceptionM.D.   On: 05/19/2021 13:33   CARDIAC CATHETERIZATION  Result Date: 05/20/2021   Ost LAD to Prox LAD lesion is 40% stenosed.  Prox LAD to Mid LAD overlapping stent segment is mostly widely patent with 5% in-stent stenosis   Mid LAD lesion is 50% stenosed.   Ramus lesion is 40% stenosed.   Mild to moderate diffuse disease elsewhere.  Codominant system.   LV end diastolic pressure is mildly elevated.   There is moderate aortic valve stenosis. SUMMARY Stable single-vessel disease with widely patent LAD stents and moderate mid LAD 50% stenosis. No obvious culprit lesion to explain non-STEMI. Mildly elevated LVEDP with normal EF by echo. Moderate aortic stenosis by echo with mean gradient of roughly 20 mmHg by cath. RECOMMENDATIONS Suspect demand ischemia with ongoing illness Continue medical management. Okay to discontinue heparin DGlenetta Hew MD  DG CHEST PORT 1 VIEW  Result Date: 05/20/2021 CLINICAL DATA:  Endotracheal tube placement. EXAM: PORTABLE CHEST 1 VIEW COMPARISON:  Same day. FINDINGS: Stable cardiomegaly. Endotracheal and nasogastric tubes are unchanged in position. Stable bilateral lung opacities are noted concerning for atelectasis or possibly infiltrates. Bony thorax is unremarkable. IMPRESSION: Stable support apparatus.  Stable bibasilar opacities. Electronically Signed   By: JMarijo ConceptionM.D.   On: 05/20/2021 12:05   DG CHEST PORT 1 VIEW  Result Date: 05/20/2021 CLINICAL DATA:  80year old male with history of non ST elevation myocardial  infarction. EXAM: PORTABLE CHEST 1 VIEW COMPARISON:  Chest x-ray 05/19/2021. FINDINGS: An endotracheal tube is in place with tip 3.2 cm above the carina. Transcutaneous defibrillator pad projecting over the mid thorax. Lung volumes are low. Bibasilar opacities (left greater than right) may reflect areas of atelectasis and/or consolidation. Small left pleural effusion. No definite right pleural effusion. No pneumothorax. Nodular density projecting over the right upper lobe, not readily apparent on the recent prior examination, potentially artifact, measuring 1.4 cm in diameter. Mild diffuse interstitial prominence and cephalization of the pulmonary vasculature, suggesting a background of mild interstitial pulmonary edema. Heart size is mildly enlarged. The patient is rotated to the left on today's exam, resulting in distortion of the mediastinal contours and  reduced diagnostic sensitivity and specificity for mediastinal pathology. Atherosclerotic calcifications in the thoracic aorta. IMPRESSION: 1. Support apparatus, as above. 2. The appearance the chest suggests mild congestive heart failure, as above. 3. Bibasilar opacities which may reflect areas of atelectasis and/or consolidation. 4. New nodular density projecting over the region of the right upper lobe, potentially artifactual. Close attention on follow-up studies is recommended to ensure resolution of this finding. 5. Aortic atherosclerosis. Electronically Signed   By: Vinnie Langton M.D.   On: 05/20/2021 10:58   DG Chest Portable 1 View  Result Date: 05/19/2021 CLINICAL DATA:  Shortness of breath EXAM: PORTABLE CHEST 1 VIEW COMPARISON:  Previous studies including the examination of 10/15/2019 FINDINGS: Transverse diameter of heart is increased. There are no signs of pulmonary edema. There are linear densities in medial left lower lung fields. Left lateral CP angle is indistinct. There is no definite pneumothorax. Faint linear density seen in the lateral  aspect of right lower lung fields may suggest pleural thickening. Less likely possibility would be loculated right pneumothorax. IMPRESSION: Cardiomegaly. Increased markings in the medial lower lung fields may suggest scarring or subsegmental atelectasis. There are no new focal infiltrates. There is faint linear radiopacity in the lateral aspect of right lower lung fields which may be due to pleural thickening or suggest small loculated right pneumothorax. There is no demonstrable right apical pneumothorax. Repeat PA views of chest in the inspiration and expiration may be considered. Electronically Signed   By: Elmer Picker M.D.   On: 05/19/2021 12:42   CT HEAD CODE STROKE WO CONTRAST  Result Date: 05/20/2021 CLINICAL DATA:  Code stroke. Neuro deficit, acute, stroke suspected. EXAM: CT HEAD WITHOUT CONTRAST TECHNIQUE: Contiguous axial images were obtained from the base of the skull through the vertex without intravenous contrast. COMPARISON:  Head CT 06/10/2007. FINDINGS: Brain: Mild generalized cerebral atrophy, not unexpected for age. There is no acute intracranial hemorrhage. No demarcated cortical infarct. No extra-axial fluid collection. No evidence of an intracranial mass. No midline shift. Vascular: No hyperdense vessel.  Atherosclerotic calcifications. Skull: Normal. Negative for fracture or focal lesion. Sinuses/Orbits: Visualized orbits show no acute finding. Mild mucosal thickening within the bilateral ethmoid and right sphenoid sinuses. ASPECTS ([place] Stroke Program Early CT Score) - Ganglionic level infarction (caudate, lentiform nuclei, internal capsule, insula, M1-M3 cortex): 7 - Supraganglionic infarction (M4-M6 cortex): 3 Total score (0-10 with 10 being normal): 10 These results were communicated to Dr. Rory Percy At 11:09 amon 12/9/2022by text page via the [facility] messaging system. IMPRESSION: No evidence of acute intracranial abnormality. ASPECTS is 10. Mild generalized cerebral atrophy.  Mild bilateral ethmoid and right maxillary sinus mucosal thickening. Electronically Signed   By: Kellie Simmering D.O.   On: 05/20/2021 11:11   CT ANGIO HEAD NECK W WO CM W PERF (CODE STROKE)  Result Date: 05/20/2021 CLINICAL DATA:  Stroke suspected EXAM: CT ANGIOGRAPHY HEAD AND NECK TECHNIQUE: Multidetector CT imaging of the head and neck was performed using the standard protocol during bolus administration of intravenous contrast. Multiplanar CT image reconstructions and MIPs were obtained to evaluate the vascular anatomy. Carotid stenosis measurements (when applicable) are obtained utilizing NASCET criteria, using the distal internal carotid diameter as the denominator. CONTRAST:  54m OMNIPAQUE IOHEXOL 350 MG/ML SOLN COMPARISON:  No prior CTA, correlation is made with same day CT head. FINDINGS: CT HEAD FINDINGS For noncontrast findings, please see same day CT head. CTA NECK FINDINGS Aortic arch: Standard branching. Imaged portion shows no evidence of aneurysm or dissection.  No significant stenosis of the major arch vessel origins. Aortic atherosclerosis. The main pulmonary artery measures up to 3.4 cm, above the upper limit of normal, as can be seen in pulmonary hypertension. Right carotid system: No evidence of dissection, stenosis (50% or greater) or occlusion. Left carotid system: Approximately 50% stenosis in the proximal left internal carotid artery, secondary to calcified plaque. No occlusion or dissection. Vertebral arteries: Right dominant. No evidence of dissection, stenosis (50% or greater) or occlusion. Scattered calcifications in the V1 and V2 segments Skeleton: No acute osseous abnormality. Degenerative changes in the cervical spine. Other neck: The patient is intubated.  Otherwise negative. Upper chest: Atelectasis.  Emphysema. Review of the MIP images confirms the above findings CTA HEAD FINDINGS Anterior circulation: Both internal carotid arteries are patent to the termini, with mild  calcifications but without significant stenosis. A1 segments patent. Normal anterior communicating artery. Anterior cerebral arteries are patent to their distal aspects. No M1 stenosis or occlusion. Normal MCA bifurcations. Distal MCA branches perfused and symmetric. Posterior circulation: Vertebral arteries patent to the vertebrobasilar junction without stenosis. Posterior inferior cerebral arteries patent bilaterally. Basilar patent to its distal aspect. Superior cerebellar arteries patent bilaterally. PCAs perfused to their distal aspects without stenosis. The bilateral posterior communicating arteries are not visualized. Venous sinuses: As permitted by contrast timing, patent. Anatomic variants: None significant. Review of the MIP images confirms the above findings IMPRESSION: 1. No intracranial large vessel occlusion or significant stenosis. 2. Approximately 50% stenosis in the proximal left ICA. No other hemodynamically significant stenosis in the neck. 3. Enlargement of the main pulmonary artery, as can be seen in the setting of pulmonary hypertension. 4. Aortic Atherosclerosis (ICD10-I70.0) and Emphysema (ICD10-J43.9). Electronically Signed   By: Merilyn Baba M.D.   On: 05/20/2021 11:36    Cardiac Studies   Cath:     Patient Profile     80 y.o. male  with distant LAD PCI in 2009, HTN, and HLD, hx of AAA s/p open repair 2021, hx of Mild AS mean gradients 20s and DVI 0.45  who is being seen 05/19/2021 for the evaluation of chest pain.   Assessment & Plan    NSTEMI:  Likely demand ischemia given results above.   No need for a change in cardiac intervention.  No change in therapy.     AS:  This was moderate on echo in Nov.     ELEVATED LIVER ENZYMES: Thought possibly to be ischemic hepatopathy.  GI is following.     RESPIRATORY FAILURE:  Likely extubation today. .  Please call with further questions.    For questions or updates, please contact [place] Please consult  [email or web address] for contact info under Cardiology/STEMI.   Signed, Minus Breeding, MD  05/21/2021, 9:20 AM

## 2021-05-21 NOTE — Progress Notes (Signed)
Critical care notes from last evening reviewed.  Patient put on empiric heparin for concern of PE, CTA planned for today.  D-dimer elevated  Transaminases improving, elevation of bilirubin to 1.5  Overall, still most consistent with a hypoxic/ischemic hepatopathy.  If so, bilirubin may continue to rise as transaminases fall.  Please call today if you need [place] to see this patient in person, otherwise we will check back tomorrow.  Wilfrid Lund, MD

## 2021-05-21 NOTE — Progress Notes (Signed)
K+ 3.2 Replaced per protocol  

## 2021-05-22 ENCOUNTER — Telehealth: Payer: Self-pay | Admitting: Internal Medicine

## 2021-05-22 ENCOUNTER — Inpatient Hospital Stay (HOSPITAL_COMMUNITY): Payer: Medicare HMO

## 2021-05-22 DIAGNOSIS — R609 Edema, unspecified: Secondary | ICD-10-CM

## 2021-05-22 DIAGNOSIS — R748 Abnormal levels of other serum enzymes: Secondary | ICD-10-CM | POA: Diagnosis not present

## 2021-05-22 DIAGNOSIS — R0902 Hypoxemia: Secondary | ICD-10-CM

## 2021-05-22 DIAGNOSIS — G4733 Obstructive sleep apnea (adult) (pediatric): Secondary | ICD-10-CM

## 2021-05-22 DIAGNOSIS — G9340 Encephalopathy, unspecified: Secondary | ICD-10-CM | POA: Diagnosis not present

## 2021-05-22 DIAGNOSIS — R7401 Elevation of levels of liver transaminase levels: Secondary | ICD-10-CM | POA: Diagnosis not present

## 2021-05-22 LAB — CBC WITH DIFFERENTIAL/PLATELET
Abs Immature Granulocytes: 0.11 10*3/uL — ABNORMAL HIGH (ref 0.00–0.07)
Basophils Absolute: 0.1 10*3/uL (ref 0.0–0.1)
Basophils Relative: 1 %
Eosinophils Absolute: 0.2 10*3/uL (ref 0.0–0.5)
Eosinophils Relative: 2 %
HCT: 33.2 % — ABNORMAL LOW (ref 39.0–52.0)
Hemoglobin: 8.6 g/dL — ABNORMAL LOW (ref 13.0–17.0)
Immature Granulocytes: 1 %
Lymphocytes Relative: 12 %
Lymphs Abs: 1.6 10*3/uL (ref 0.7–4.0)
MCH: 21 pg — ABNORMAL LOW (ref 26.0–34.0)
MCHC: 25.9 g/dL — ABNORMAL LOW (ref 30.0–36.0)
MCV: 81.2 fL (ref 80.0–100.0)
Monocytes Absolute: 1.6 10*3/uL — ABNORMAL HIGH (ref 0.1–1.0)
Monocytes Relative: 12 %
Neutro Abs: 10 10*3/uL — ABNORMAL HIGH (ref 1.7–7.7)
Neutrophils Relative %: 72 %
Platelets: 282 10*3/uL (ref 150–400)
RBC: 4.09 MIL/uL — ABNORMAL LOW (ref 4.22–5.81)
RDW: 17.4 % — ABNORMAL HIGH (ref 11.5–15.5)
WBC: 13.6 10*3/uL — ABNORMAL HIGH (ref 4.0–10.5)
nRBC: 0.2 % (ref 0.0–0.2)

## 2021-05-22 LAB — COMPREHENSIVE METABOLIC PANEL
ALT: 385 U/L — ABNORMAL HIGH (ref 0–44)
AST: 117 U/L — ABNORMAL HIGH (ref 15–41)
Albumin: 3 g/dL — ABNORMAL LOW (ref 3.5–5.0)
Alkaline Phosphatase: 65 U/L (ref 38–126)
Anion gap: 8 (ref 5–15)
BUN: 22 mg/dL (ref 8–23)
CO2: 30 mmol/L (ref 22–32)
Calcium: 8.1 mg/dL — ABNORMAL LOW (ref 8.9–10.3)
Chloride: 100 mmol/L (ref 98–111)
Creatinine, Ser: 0.52 mg/dL — ABNORMAL LOW (ref 0.61–1.24)
GFR, Estimated: >60 mL/min (ref >60)
Glucose, Bld: 108 mg/dL — ABNORMAL HIGH (ref 70–99)
Potassium: 3.9 mmol/L (ref 3.5–5.1)
Sodium: 138 mmol/L (ref 135–145)
Total Bilirubin: 1.2 mg/dL (ref 0.3–1.2)
Total Protein: 6.1 g/dL — ABNORMAL LOW (ref 6.5–8.1)

## 2021-05-22 LAB — CULTURE, RESPIRATORY W GRAM STAIN: Culture: NORMAL

## 2021-05-22 LAB — GLUCOSE, CAPILLARY
Glucose-Capillary: 114 mg/dL — ABNORMAL HIGH (ref 70–99)
Glucose-Capillary: 193 mg/dL — ABNORMAL HIGH (ref 70–99)
Glucose-Capillary: 82 mg/dL (ref 70–99)
Glucose-Capillary: 148 mg/dL — ABNORMAL HIGH (ref 70–99)

## 2021-05-22 MED ORDER — TRAZODONE HCL 50 MG PO TABS: 25.0000 mg | ORAL_TABLET | Freq: Every evening | ORAL | Status: DC | PRN

## 2021-05-22 MED ORDER — POLYETHYLENE GLYCOL 3350 17 G PO PACK
17.0000 g | PACK | Freq: Every day | ORAL | Status: DC
  Administered 2021-05-23 – 2021-05-27 (×5): 17 g via ORAL
  Filled 2021-05-22 (×5): qty 1

## 2021-05-22 MED ORDER — PANTOPRAZOLE SODIUM 40 MG PO TBEC
40.0000 mg | DELAYED_RELEASE_TABLET | Freq: Two times a day (BID) | ORAL | Status: DC
  Administered 2021-05-22 – 2021-05-27 (×10): 40 mg via ORAL
  Filled 2021-05-22 (×10): qty 1

## 2021-05-22 MED ORDER — ASPIRIN 325 MG PO TABS
325.0000 mg | ORAL_TABLET | Freq: Every day | ORAL | Status: DC
  Administered 2021-05-23: 325 mg via ORAL
  Filled 2021-05-22: qty 1

## 2021-05-22 NOTE — Progress Notes (Signed)
VASCULAR LAB    Bilateral lower extremity venous duplex has been performed.  See CV proc for preliminary results.   Kelcy Baeten, RVT 05/22/2021, 1:03 PM

## 2021-05-22 NOTE — Progress Notes (Addendum)
NAME:  [name], MRN:  [id number], DOB:  1940/09/17, LOS: 3 ADMISSION DATE:  05/19/2021, CONSULTATION DATE: 05/18/2021 REFERRING MD: Neurology, CHIEF COMPLAINT: Altered mental status requiring intubation  History of Present Illness:  80 year old male who was transferred down to [facility] non-ST elevation MI with cardiac catheterization performed on 05/20/2021 with no acute lesions.  Following cardiac cath has decreased level of consciousness and required intubation and a code stroke being called.  Pulmonary critical care called to the bedside patient was intubated per Dr. Lamonte Sakai.  Airway has been flagged as difficult.  Patient be transferred to the intensive care unit for further evaluation and treatment. .  Pertinent  Medical History   Past Medical History:  Diagnosis Date   AAA (abdominal aortic aneurysm)    Bladder cancer ([place])    s/p TURBT 09/26/19   Blood transfusion without reported diagnosis    Coronary artery disease    stents placed   GERD (gastroesophageal reflux disease)    HOH (hard of hearing)    Hyperlipidemia    Hypertension    Iron deficiency anemia 05/14/2018   Polio    hx of. right arm atrophy     Significant Hospital Events: Including procedures, antibiotic start and stop dates in addition to other pertinent events   05/20/2021 status post cardiac catheterization with no acute lesions 05/20/2021 decreased level consciousness required intubation 05/20/2021 code stroke 12/10 CTPE study negative 12/10 extubated   Interim History / Subjective:  Tolerated extubation. Feels back to himself. Daughter and wife at bedside. Note that he frequently falls asleep Objective   Blood pressure 129/67, pulse 70, temperature 98.7 F (37.1 C), resp. rate (!) 26, height 5\' 6"  (1.676 m), weight 90.7 kg, SpO2 97 %.        Intake/Output Summary (Last 24 hours) at 05/22/2021 1522 Last data filed at 05/22/2021 1200 Gross per 24 hour  Intake 1501.73 ml  Output 1700 ml   Net -198.27 ml   Filed Weights   05/19/21 1412 05/20/21 0315 05/21/21 0300  Weight: 88.9 kg 92.4 kg 90.7 kg    Examination: Sitting up in chair, no distress Lungs ctab no wheezes or crackles Stable on room air No edema RRR no mrg LLE foot drop - chronic RUE hemiparesis - chronic    Labs/imaging LE dopplers reviewed - no PE   Resolved Hospital Problem list   encephalopathy Acute hypoxemic respiratory failure  Assessment & Plan:  Acute metabolic encephalopathy secondary to presumed hypercapnic respiratory failure Sleep disordered breathing - most likely etiology is sleep disordered breathing with transient hypercapnia and hypoxemia - neuro had recommend MRI initially when his encephalopathy was uncertain. Feel less strongly about this now given alternative explanation for his encephalopathy and patient refusing MRI due to claustrophobia. - family reports excessive daytime sleepiness at home - will order ONO to qualify for nocrturnal o2 at discharge - will set him up to see our sleep doc in clinic.  - has been ruled out for PE, MI, stroke  Acute hypoxemic respiratory failure - resolved - completing 5 days HAP coverage  Cardiac cath performed on 05/20/2021 without acute lesion in the setting of non-ST elevation MI known heart failure Per cardiology  Acute on chronic Anemia Received treatment dose lovenox x1 CPTE study negative, will transition to ppx dosing heparin CT A/P negative for RP bleed   Best Practice (right click and "Reselect all SmartList Selections" daily)   Diet/type: heart healthy DVT prophylaxis: not indicated GI prophylaxis: PPI Lines: N/A  Foley:  N/A Code Status:  full code Last date of multidisciplinary goals of care discussion - updated family at bedside 12/11.   Disposition is to the floor. PCCM to follow as needed. TRH to reassume care.  Needs Overnight oximetry prior to discharge for nocturnal oxygen needs. I will make sure he gets  follow up with one of our sleep docs.   Lenice Llamas, MD Pulmonary and [place] 05/22/2021 3:23 PM Pager: see AMION  If no response to pager, please call critical care on call (see AMION) until 7pm After 7:00 pm call [place]     Labs   CBC: Recent Labs  Lab 05/19/21 1151 05/20/21 0054 05/20/21 1032 05/20/21 1222 05/21/21 0330 05/22/21 0408  WBC 14.6* 15.2* 15.9*  --  13.9* 13.6*  NEUTROABS 11.3* 11.4*  --   --  10.5* 10.0*  HGB 10.3* 9.1* 8.9* 11.6* 8.2* 8.6*  HCT 39.6 34.6* 34.5* 34.0* 30.8* 33.2*  MCV 82.3 80.5 83.1  --  79.4* 81.2  PLT 362 297 269  --  249 409    Basic Metabolic Panel: Recent Labs  Lab 05/20/21 0054 05/20/21 1032 05/20/21 1043 05/20/21 1222 05/21/21 0330 05/22/21 0408  NA 136 136 135 138 140 138  K 4.3 5.0 5.0 4.2 3.2* 3.9  CL 96* 97* 97*  --  97* 100  CO2 36* 35* 34*  --  33* 30  GLUCOSE 131* 173* 171*  --  107* 108*  BUN 24* 25* 24*  --  30* 22  CREATININE 0.67 0.49* 0.48*  --  0.76 0.52*  CALCIUM 8.5* 7.9* 7.9*  --  8.5* 8.1*  MG 2.0  --   --   --   --   --    GFR: Estimated Creatinine Clearance: 77.7 mL/min (A) (by C-G formula based on SCr of 0.52 mg/dL (L)). Recent Labs  Lab 05/19/21 1403 05/20/21 0054 05/20/21 1032 05/21/21 0330 05/22/21 0408  WBC  --  15.2* 15.9* 13.9* 13.6*  LATICACIDVEN 1.5  --   --   --   --     Liver Function Tests: Recent Labs  Lab 05/19/21 1151 05/20/21 0054 05/20/21 1032 05/21/21 0330 05/22/21 0408  AST 303* 871* 593* 295* 117*  ALT 272* 709* 700* 554* 385*  ALKPHOS 81 72 75 65 65  BILITOT 0.7 0.6 0.7 1.5* 1.2  PROT 8.0 6.5 6.4* 5.9* 6.1*  ALBUMIN 4.2 3.5 3.4* 3.1* 3.0*   Recent Labs  Lab 05/19/21 1151  LIPASE 32   No results for input(s): AMMONIA in the last 168 hours.  ABG    Component Value Date/Time   PHART 7.362 05/20/2021 1222   PCO2ART 60.2 (H) 05/20/2021 1222   PO2ART 70 (L) 05/20/2021 1222   HCO3 34.2 (H) 05/20/2021 1222   TCO2 36 (H)  05/20/2021 1222   ACIDBASEDEF 1.0 10/14/2019 1406   O2SAT 92.0 05/20/2021 1222     Coagulation Profile: No results for input(s): INR, PROTIME in the last 168 hours.  Cardiac Enzymes: No results for input(s): CKTOTAL, CKMB, CKMBINDEX, TROPONINI in the last 168 hours.  HbA1C: Hgb A1c MFr Bld  Date/Time Value Ref Range Status  05/19/2021 11:51 AM 6.4 (H) 4.8 - 5.6 % Final    Comment:    (NOTE) Pre diabetes:          5.7%-6.4%  Diabetes:              >6.4%  Glycemic control for   <7.0% adults with diabetes  CBG: Recent Labs  Lab 05/21/21 1138 05/21/21 1624 05/21/21 2101 05/22/21 0659 05/22/21 1122  GLUCAP 113* 108* 103* 114* 193*

## 2021-05-22 NOTE — Progress Notes (Signed)
Verbal report given to [place], Therapist, sports. All questions and concerns addressed. Belongings returned to pt and wife at bedside to travel with pt to new room.

## 2021-05-22 NOTE — Telephone Encounter (Signed)
Needs next available follow up with sleep doctor- [place] or [place].  Currently hospitalized. Next two weeks.

## 2021-05-22 NOTE — Progress Notes (Signed)
[facility] Progress Note  Chief Complaint: Elevated LFTs  History:  [name] is extubated since I last saw him, alert and conversational, and has no current complaints. CTA negative for PE, lower extremity ultrasounds performed today, result pending.  Objective:   Current Facility-Administered Medications:    0.9 %  sodium chloride infusion, 250 mL, Intravenous, PRN, Leonie Man, MD, Paused at 05/22/21 1139   albuterol (PROVENTIL) (2.5 MG/3ML) 0.083% nebulizer solution 2.5 mg, 2.5 mg, Nebulization, Q6H PRN, Collene Gobble, MD   Derrill Memo ON 05/23/2021] aspirin tablet 325 mg, 325 mg, Oral, Daily, Lyndee Leo, RPH   ceFEPIme (MAXIPIME) 2 g in sodium chloride 0.9 % 100 mL IVPB, 2 g, Intravenous, Q8H, Lyndee Leo, RPH, Last Rate: 200 mL/hr at 05/22/21 1200, Infusion Verify at 05/22/21 1200   Chlorhexidine Gluconate Cloth 2 % PADS 6 each, 6 each, Topical, Daily, Serafina Mitchell, MD, 6 each at 05/22/21 0915   docusate sodium (COLACE) capsule 100 mg, 100 mg, Oral, BID, Spero Geralds, MD, 100 mg at 05/22/21 0915   heparin injection 5,000 Units, 5,000 Units, Subcutaneous, Q8H, Spero Geralds, MD, 5,000 Units at 05/22/21 1348   insulin aspart (novoLOG) injection 0-9 Units, 0-9 Units, Subcutaneous, TID WC, Leonie Man, MD, 2 Units at 05/22/21 1136   MEDLINE mouth rinse, 15 mL, Mouth Rinse, BID, Spero Geralds, MD, 15 mL at 05/22/21 0915   nitroGLYCERIN (NITROSTAT) SL tablet 0.4 mg, 0.4 mg, Sublingual, Q5 Min x 3 PRN, Leonie Man, MD   ondansetron [facility]) tablet 4 mg, 4 mg, Oral, Q6H PRN **OR** ondansetron (ZOFRAN) injection 4 mg, 4 mg, Intravenous, Q6H PRN, Leonie Man, MD   pantoprazole (PROTONIX) EC tablet 40 mg, 40 mg, Oral, BID, Lyndee Leo, RPH   [START ON 05/23/2021] polyethylene glycol (MIRALAX / GLYCOLAX) packet 17 g, 17 g, Oral, Daily, Lyndee Leo, RPH   sodium chloride flush (NS) 0.9 % injection 3 mL, 3 mL, Intravenous, Q12H, Leonie Man, MD, 3  mL at 05/22/21 0915   sodium chloride flush (NS) 0.9 % injection 3 mL, 3 mL, Intravenous, PRN, Leonie Man, MD   traZODone (DESYREL) tablet 25 mg, 25 mg, Oral, QHS PRN, Lyndee Leo, RPH   sodium chloride Stopped (05/22/21 1139)   ceFEPime (MAXIPIME) IV 200 mL/hr at 05/22/21 1200     Vital signs in last 24 hrs: Vitals:   05/22/21 1118 05/22/21 1123  BP: 129/67   Pulse: 70   Resp: (!) 26   Temp:  98.7 F (37.1 C)  SpO2: 97%     Intake/Output Summary (Last 24 hours) at 05/22/2021 1404 Last data filed at 05/22/2021 1200 Gross per 24 hour  Intake 1501.73 ml  Output 1700 ml  Net -198.27 ml     Physical Exam Cardiac: RRR without murmurs, S1S2 heard, no peripheral edema Pulm: clear to auscultation bilaterally, normal RR and effort noted Abdomen: soft, no tenderness, with active bowel sounds. No guarding or palpable hepatosplenomegaly Skin; warm and dry, no jaundice  Recent Labs:  CBC Latest Ref Rng & Units 05/22/2021 05/21/2021 05/20/2021  WBC 4.0 - 10.5 K/uL 13.6(H) 13.9(H) -  Hemoglobin 13.0 - 17.0 g/dL 8.6(L) 8.2(L) 11.6(L)  Hematocrit 39.0 - 52.0 % 33.2(L) 30.8(L) 34.0(L)  Platelets 150 - 400 K/uL 282 249 -    No results for input(s): INR in the last 168 hours. CMP Latest Ref Rng & Units 05/22/2021 05/21/2021 05/20/2021  Glucose 70 - 99 mg/dL 108(H)  107(H) -  BUN 8 - 23 mg/dL 22 30(H) -  Creatinine 0.61 - 1.24 mg/dL 0.52(L) 0.76 -  Sodium 135 - 145 mmol/L 138 140 138  Potassium 3.5 - 5.1 mmol/L 3.9 3.2(L) 4.2  Chloride 98 - 111 mmol/L 100 97(L) -  CO2 22 - 32 mmol/L 30 33(H) -  Calcium 8.9 - 10.3 mg/dL 8.1(L) 8.5(L) -  Total Protein 6.5 - 8.1 g/dL 6.1(L) 5.9(L) -  Total Bilirubin 0.3 - 1.2 mg/dL 1.2 1.5(H) -  Alkaline Phos 38 - 126 U/L 65 65 -  AST 15 - 41 U/L 117(H) 295(H) -  ALT 0 - 44 U/L 385(H) 554(H) -     Radiologic studies:   Assessment & Plan  Assessment: Transaminitis/abnormal LFTs.  Increased during period of acute illness that is still  as of as yet unclear cardiopulmonary event from which he appears to have now completely recovered. He may have had some mild ischemic hepatopathy, I do not think this is consistent with a primary hepatic condition.  LFTs improving, expect they will normalize in the next couple of weeks.  No further testing or treatment plan from my perspective, signing off, thank you for involving [place] in his care.  He does not need regular follow-up with our clinic unless new GI/hepatology issues arise. Primary care can check his LFTs in about 2 weeks.  [name] Office: [phone]

## 2021-05-23 LAB — CBC
HCT: 34.2 % — ABNORMAL LOW (ref 39.0–52.0)
Hemoglobin: 8.9 g/dL — ABNORMAL LOW (ref 13.0–17.0)
MCH: 20.7 pg — ABNORMAL LOW (ref 26.0–34.0)
MCHC: 26 g/dL — ABNORMAL LOW (ref 30.0–36.0)
MCV: 79.5 fL — ABNORMAL LOW (ref 80.0–100.0)
Platelets: 275 10*3/uL (ref 150–400)
RBC: 4.3 MIL/uL (ref 4.22–5.81)
RDW: 17.6 % — ABNORMAL HIGH (ref 11.5–15.5)
WBC: 11.1 10*3/uL — ABNORMAL HIGH (ref 4.0–10.5)
nRBC: 0 % (ref 0.0–0.2)

## 2021-05-23 LAB — GLUCOSE, CAPILLARY
Glucose-Capillary: 125 mg/dL — ABNORMAL HIGH (ref 70–99)
Glucose-Capillary: 97 mg/dL (ref 70–99)
Glucose-Capillary: 126 mg/dL — ABNORMAL HIGH (ref 70–99)
Glucose-Capillary: 148 mg/dL — ABNORMAL HIGH (ref 70–99)

## 2021-05-23 MED ORDER — ASPIRIN EC 81 MG PO TBEC
81.0000 mg | DELAYED_RELEASE_TABLET | Freq: Every day | ORAL | Status: DC
  Administered 2021-05-23 – 2021-05-27 (×5): 81 mg via ORAL
  Filled 2021-05-23 (×5): qty 1

## 2021-05-23 MED ORDER — ADULT MULTIVITAMIN W/MINERALS CH
1.0000 | ORAL_TABLET | Freq: Every day | ORAL | Status: DC
  Administered 2021-05-23 – 2021-05-27 (×5): 1 via ORAL
  Filled 2021-05-23 (×5): qty 1

## 2021-05-23 MED ORDER — CLOPIDOGREL BISULFATE 75 MG PO TABS
75.0000 mg | ORAL_TABLET | Freq: Every day | ORAL | Status: DC
  Administered 2021-05-23 – 2021-05-27 (×5): 75 mg via ORAL
  Filled 2021-05-23 (×5): qty 1

## 2021-05-23 MED FILL — Medication: Qty: 1 | Status: AC

## 2021-05-23 MED FILL — Verapamil HCl IV Soln 2.5 MG/ML: INTRAVENOUS | Qty: 2 | Status: AC

## 2021-05-23 NOTE — Progress Notes (Signed)
Physical Therapy Evaluation Patient Details Name: [name] MRN: [id number] DOB: [birth year] Today's Date: 05/23/2021  History of Present Illness  80 yo male admitted 12/8 to [facility], was noted to have abd/chest pain, nausea, and then transaminitis and NSTEMI.  Went to [facility] where heart cath was done, became unresponsive afterward and was intubated.  Now on cannula of 3L with clearing of encephalopathy, new lung nodule, debility with underlying post polio syndrome.  PMHx:  HLD, falls, OSA,  Clinical Impression  Pt is evaluated for PT with notable R side weakness esp of RUE.  Due to this, tried platform walker for gait support.  The device may be too heavy but pt is very weak, so may try hemiwalker for further evaluation.  Pt is recommended to rehab stay due to his weakness, recent falls and ongoing changes of post polio that will make him a higher fall risk without interventions for new weakness.  Follow along with him to get control of safety with gait and to shorten stay time in rehab.  Pt had loss of balance to walk once in this session, and desaturated on 3L to 71% and then 83%.  Was not using O2 prior to this stay.  Education of his weakness and risks of fall are going to need to be reinforced with pt and wife.       Recommendations for follow up therapy are one component of a multi-disciplinary discharge planning process, led by the attending physician.  Recommendations may be updated based on patient status, additional functional criteria and insurance authorization.  Follow Up Recommendations Acute inpatient rehab (3hours/day)    Assistance Recommended at Discharge Frequent or constant Supervision/Assistance  Functional Status Assessment Patient has had a recent decline in their functional status and demonstrates the ability to make significant improvements in function in a reasonable and predictable amount of time.  Equipment Recommendations  None recommended by PT    Recommendations for  Other Services       Precautions / Restrictions Precautions Precautions: Fall Precaution Comments: 8 falls in 6 weeks Restrictions Weight Bearing Restrictions: No      Mobility  Bed Mobility               General bed mobility comments: up in chair when PT arrived    Transfers Overall transfer level: Needs assistance Equipment used: Right platform walker;1 person hand held assist Transfers: Sit to/from Stand Sit to Stand: Min assist           General transfer comment: assistance to power up with support of R arm    Ambulation/Gait Ambulation/Gait assistance: Min assist Gait Distance (Feet): 25 Feet Assistive device: Right platform walker;1 person hand held assist Gait Pattern/deviations: Step-to pattern;Step-through pattern;Decreased stride length;Trunk flexed Gait velocity: reduced   Pre-gait activities: standing support of RUE and wgt shift, instruction for use of platform walker General Gait Details: pt is struggling with balance control due to weakness on RUE with poor support on platform walker.  May do better with hemiwalker  Stairs            Wheelchair Mobility    Modified Rankin (Stroke Patients Only)       Balance Overall balance assessment: Needs assistance Sitting-balance support: Feet supported;Single extremity supported Sitting balance-Leahy Scale: Fair     Standing balance support: Bilateral upper extremity supported;During functional activity Standing balance-Leahy Scale: Poor  Pertinent Vitals/Pain Pain Assessment: No/denies pain Facial Expression: Relaxed, neutral Body Movements: Absence of movements Muscle Tension: Relaxed    Home Living Family/patient expects to be discharged to:: Private residence Living Arrangements: Spouse/significant other Available Help at Discharge: Family;Available 24 hours/day Type of Home: House Home Access: Stairs to enter Entrance Stairs-Rails: Can  reach both Entrance Stairs-Number of Steps: 4   Home Layout: Able to live on main level with bedroom/bathroom;Multi-level        Prior Function Prior Level of Function : Needs assist       Physical Assist : Mobility (physical) Mobility (physical): Gait   Mobility Comments: has been falling with no AD used       Hand Dominance   Dominant Hand: Left    Extremity/Trunk Assessment   Upper Extremity Assessment Upper Extremity Assessment: RUE deficits/detail RUE Deficits / Details: severe mm atrophy from polio RUE Coordination: decreased fine motor;decreased gross motor    Lower Extremity Assessment Lower Extremity Assessment: RLE deficits/detail RLE Deficits / Details: weakness on RLE esp hip and knee RLE Coordination: decreased gross motor    Cervical / Trunk Assessment Cervical / Trunk Assessment: Kyphotic (mild)  Communication   Communication: Expressive difficulties (speech is low volume)  Cognition Arousal/Alertness: Awake/alert Behavior During Therapy: Flat affect Overall Cognitive Status: Difficult to assess                                          General Comments General comments (skin integrity, edema, etc.): Pt is weak, with recent falls of 8 within 6 weeks per wife.  Sats dropped twice in gait to door and back, with initial one 71% and second one 83% on 3L O2.  One buckling episode in the walk    Exercises     Assessment/Plan    PT Assessment Patient needs continued PT services  PT Problem List Cardiopulmonary status limiting activity;Impaired tone;Decreased strength;Decreased activity tolerance;Decreased balance;Decreased mobility;Decreased coordination;Decreased knowledge of use of DME;Decreased safety awareness       PT Treatment Interventions DME instruction;Gait training;Stair training;Functional mobility training;Therapeutic activities;Therapeutic exercise;Balance training;Neuromuscular re-education;Patient/family education     PT Goals (Current goals can be found in the Care Plan section)  Acute Rehab PT Goals Patient Stated Goal: to get home PT Goal Formulation: With patient/family Time For Goal Achievement: 06/06/21 Potential to Achieve Goals: Good    Frequency Min 3X/week   Barriers to discharge Inaccessible home environment;Decreased caregiver support home with wife and in staired entrance    Co-evaluation               AM-PAC PT "6 Clicks" Mobility  Outcome Measure Help needed turning from your back to your side while in a flat bed without using bedrails?: A Little Help needed moving from lying on your back to sitting on the side of a flat bed without using bedrails?: A Little Help needed moving to and from a bed to a chair (including a wheelchair)?: A Little Help needed standing up from a chair using your arms (e.g., wheelchair or bedside chair)?: A Little Help needed to walk in hospital room?: A Little Help needed climbing 3-5 steps with a railing? : Total 6 Click Score: 16    End of Session Equipment Utilized During Treatment: Gait belt;Oxygen Activity Tolerance: Patient limited by fatigue;Treatment limited secondary to medical complications (Comment) Patient left: in chair;with call bell/phone within reach;with chair alarm set;with family/visitor  present Nurse Communication: Mobility status;Other (comment) (MD and nursing with sats) PT Visit Diagnosis: Unsteadiness on feet (R26.81);Muscle weakness (generalized) (M62.81);Difficulty in walking, not elsewhere classified (R26.2)    Time: 3159-4585 PT Time Calculation (min) (ACUTE ONLY): 42 min   Charges:   PT Evaluation $PT Eval Moderate Complexity: 1 Mod PT Treatments $Gait Training: 8-22 mins $Therapeutic Activity: 8-22 mins       Ramond Dial 05/23/2021, 1:09 PM  Mee Hives, PT PhD Acute Rehab Dept. Number: [place] [facility] [place]

## 2021-05-23 NOTE — Progress Notes (Signed)
PROGRESS NOTE        PATIENT DETAILS Name: [name] Age: 80 y.o. Sex: male Date of Birth: 1941/02/27 Admit Date: 05/19/2021 Admitting Physician Murlean Iba, MD TGG:YIRS, [name], MD  Brief Narrative: Patient is a 80 y.o. male with history of CAD s/p PCI to LAD in 2009, AAA s/p repair in 2021, HTN, HLD, GERD, bladder cancer-s/p TURBT, history of polio with right-sided atrophy-presented to the ED at [facility] on 12/8 with nausea, vomiting, abdominal/chest pain-he was found to have transaminitis, non-STEMI-subsequently transferred to [facility] where [place] was performed on 12/9 which showed stable single-vessel nonobstructive CAD-on return from the Cath Lab-he was found to be unresponsive-intubated and transferred to the ICU.  He underwent extensive evaluation while in the ICU-he was not thought to have acute CVA, CTA chest was negative for PE-he was subsequently extubated on 12/10 and transferred to [facility] on 12/12.  See below for further details.  Subjective: Lying comfortably in bed-denies any chest pain or shortness of breath.  Objective: Vitals: Blood pressure 109/64, pulse 74, temperature 98.6 F (37 C), temperature source Oral, resp. rate 17, height 5\' 6"  (1.676 m), weight 90.2 kg, SpO2 97 %.   Exam: Gen Exam:Alert awake-not in any distress HEENT:atraumatic, normocephalic Chest: B/L clear to auscultation anteriorly CVS:S1S2 regular Abdomen:soft non tender, non distended Extremities:no edema Neurology: Non focal Skin: no rash  Pertinent Labs/Radiology: Recent Labs  Lab 05/19/21 1151 05/20/21 0054 05/20/21 1032 05/20/21 1043 05/21/21 0330 05/22/21 0408 05/23/21 0121  WBC 14.6* 15.2* 15.9*  --  13.9* 13.6* 11.1*  HGB 10.3* 9.1* 8.9*   < > 8.2* 8.6* 8.9*  PLT 362 297 269  --  249 282 275  NA 136 136 136   < > 140 138  --   K 4.5 4.3 5.0   < > 3.2* 3.9  --   CREATININE 1.13 0.67 0.49*   < > 0.76 0.52*  --   AST 303* 871*  593*  --  295* 117*  --   ALT 272* 709* 700*  --  554* 385*  --   ALKPHOS 81 72 75  --  65 65  --   BILITOT 0.7 0.6 0.7  --  1.5* 1.2  --    < > = values in this interval not displayed.    Assessment/Plan: Acute metabolic encephalopathy secondary to presumed hypercapnic respiratory failure requiring intubation: Suspicion is that he became unresponsive post cardiac cath due to underlying OSA that caused transient hypercapnia/hypoxemia.  CTA chest negative for PE, CT head negative for CVA, CTA head/neck negative for LVO.  Recommendations from PCCM are to pursue outpatient sleep study-awaiting overnight nocturnal desaturation study.  His encephalopathy has completely resolved-he is now awake and alert.  Acute hypoxemic/hypercapnic respiratory failure: See above-respiratory failure has improved-extubated on 12/10-attempt to titrate off oxygen today.  Await overnight nocturnal desaturation/ambulatory O2 sat-May require home O2 on discharge.  Non-STEMI: Thought to be due to demand ischemia-LHC without any significant stenosis.  Per last cardiology note-no need to change cardiac medications-suspect can be resumed on dual antiplatelet agents.  HLD: Continue to hold statins will transaminitis improves further.  Transaminitis: Suspicion that this is either from non-STEMI or ischemic hepatopathy.  LFTs decreasing-GI recommending repeating LFTs in 2 weeks at PCPs office.  Acute hepatitis serology was negative.  Influenza/COVID PCR was also negative.  HCAP: Completed 5 days of empiric antimicrobial therapy.  Sputum/blood cultures negative so far.  Normocytic anemia: No evidence of blood loss-no retroperitoneal hemorrhage seen on CT abdomen.  Suspect worsening anemia due to acute illness-repeat CBC periodically.  4 mm right upper lobe lung nodule: Repeat CT in 12 months.  Debility/deconditioning: Await PT/OT-has postpolio syndrome.  Nutrition Status: Nutrition Problem: Inadequate oral intake Etiology:  acute illness Signs/Symptoms: NPO status Interventions: MVI  Obesity Estimated body mass index is 32.1 kg/m as calculated from the following:   Height as of this encounter: 5\' 6"  (1.676 m).   Weight as of this encounter: 90.2 kg.    Procedures: 12/9>> LHC Consults: GI, cardiology, neurology, PCCM DVT Prophylaxis:Heparin Code Status:Full code  Family Communication: Spouse at bedside  Time spent: 51 minutes-Greater than 50% of this time was spent in counseling, explanation of diagnosis, planning of further management, and coordination of care.   Disposition Plan: Status is: Inpatient  Remains inpatient appropriate because: Awaiting PT/OT-ambulatory O2 sat-nocturnal O2 saturation-May require home O2.   Diet: Diet Order             Diet Heart Room service appropriate? Yes; Fluid consistency: Thin  Diet effective now                     Antimicrobial agents: Anti-infectives (From admission, onward)    Start     Dose/Rate Route Frequency Ordered Stop   05/21/21 2000  vancomycin (VANCOREADY) IVPB 1250 mg/250 mL  Status:  Discontinued        1,250 mg 166.7 mL/hr over 90 Minutes Intravenous Every 24 hours 05/20/21 1918 05/22/21 1033   05/20/21 2000  ceFEPIme (MAXIPIME) 2 g in sodium chloride 0.9 % 100 mL IVPB        2 g 200 mL/hr over 30 Minutes Intravenous Every 8 hours 05/20/21 1913 05/25/21 1959   05/20/21 2000  vancomycin (VANCOREADY) IVPB 2000 mg/400 mL        2,000 mg 200 mL/hr over 120 Minutes Intravenous  Once 05/20/21 1913 05/20/21 2322        MEDICATIONS: Scheduled Meds:  aspirin  325 mg Oral Daily   Chlorhexidine Gluconate Cloth  6 each Topical Daily   docusate sodium  100 mg Oral BID   heparin injection (subcutaneous)  5,000 Units Subcutaneous Q8H   insulin aspart  0-9 Units Subcutaneous TID WC   mouth rinse  15 mL Mouth Rinse BID   pantoprazole  40 mg Oral BID   polyethylene glycol  17 g Oral Daily   sodium chloride flush  3 mL Intravenous Q12H    Continuous Infusions:  sodium chloride Stopped (05/22/21 1139)   ceFEPime (MAXIPIME) IV 2 g (05/23/21 0415)   PRN Meds:.sodium chloride, albuterol, nitroGLYCERIN, ondansetron **OR** ondansetron (ZOFRAN) IV, sodium chloride flush, traZODone   I have personally reviewed following labs and imaging studies  LABORATORY DATA: CBC: Recent Labs  Lab 05/19/21 1151 05/20/21 0054 05/20/21 1032 05/20/21 1222 05/21/21 0330 05/22/21 0408 05/23/21 0121  WBC 14.6* 15.2* 15.9*  --  13.9* 13.6* 11.1*  NEUTROABS 11.3* 11.4*  --   --  10.5* 10.0*  --   HGB 10.3* 9.1* 8.9* 11.6* 8.2* 8.6* 8.9*  HCT 39.6 34.6* 34.5* 34.0* 30.8* 33.2* 34.2*  MCV 82.3 80.5 83.1  --  79.4* 81.2 79.5*  PLT 362 297 269  --  249 282 254    Basic Metabolic Panel: Recent Labs  Lab 05/20/21 0054 05/20/21 1032 05/20/21 1043 05/20/21 1222 05/21/21  0330 05/22/21 0408  NA 136 136 135 138 140 138  K 4.3 5.0 5.0 4.2 3.2* 3.9  CL 96* 97* 97*  --  97* 100  CO2 36* 35* 34*  --  33* 30  GLUCOSE 131* 173* 171*  --  107* 108*  BUN 24* 25* 24*  --  30* 22  CREATININE 0.67 0.49* 0.48*  --  0.76 0.52*  CALCIUM 8.5* 7.9* 7.9*  --  8.5* 8.1*  MG 2.0  --   --   --   --   --     GFR: Estimated Creatinine Clearance: 77.5 mL/min (A) (by C-G formula based on SCr of 0.52 mg/dL (L)).  Liver Function Tests: Recent Labs  Lab 05/19/21 1151 05/20/21 0054 05/20/21 1032 05/21/21 0330 05/22/21 0408  AST 303* 871* 593* 295* 117*  ALT 272* 709* 700* 554* 385*  ALKPHOS 81 72 75 65 65  BILITOT 0.7 0.6 0.7 1.5* 1.2  PROT 8.0 6.5 6.4* 5.9* 6.1*  ALBUMIN 4.2 3.5 3.4* 3.1* 3.0*   Recent Labs  Lab 05/19/21 1151  LIPASE 32   No results for input(s): AMMONIA in the last 168 hours.  Coagulation Profile: No results for input(s): INR, PROTIME in the last 168 hours.  Cardiac Enzymes: No results for input(s): CKTOTAL, CKMB, CKMBINDEX, TROPONINI in the last 168 hours.  BNP (last 3 results) No results for input(s): PROBNP in the  last 8760 hours.  Lipid Profile: No results for input(s): CHOL, HDL, LDLCALC, TRIG, CHOLHDL, LDLDIRECT in the last 72 hours.  Thyroid Function Tests: No results for input(s): TSH, T4TOTAL, FREET4, T3FREE, THYROIDAB in the last 72 hours.  Anemia Panel: No results for input(s): VITAMINB12, FOLATE, FERRITIN, TIBC, IRON, RETICCTPCT in the last 72 hours.  Urine analysis:    Component Value Date/Time   COLORURINE YELLOW 10/09/2019 0821   APPEARANCEUR CLOUDY (A) 10/09/2019 0821   LABSPEC 1.015 10/09/2019 0821   PHURINE 6.0 10/09/2019 0821   GLUCOSEU NEGATIVE 10/09/2019 0821   HGBUR LARGE (A) 10/09/2019 0821   BILIRUBINUR NEGATIVE 10/09/2019 0821   KETONESUR NEGATIVE 10/09/2019 0821   PROTEINUR 30 (A) 10/09/2019 0821   NITRITE POSITIVE (A) 10/09/2019 0821   LEUKOCYTESUR LARGE (A) 10/09/2019 0821    Sepsis Labs: Lactic Acid, Venous    Component Value Date/Time   LATICACIDVEN 1.5 05/19/2021 1403    MICROBIOLOGY: Recent Results (from the past 240 hour(s))  Resp Panel by RT-PCR (Flu A&B, Covid) Nasopharyngeal Swab     Status: None   Collection Time: 05/19/21 12:35 PM   Specimen: Nasopharyngeal Swab; Nasopharyngeal(NP) swabs in vial transport medium  Result Value Ref Range Status   SARS Coronavirus 2 by RT PCR NEGATIVE NEGATIVE Final    Comment: (NOTE) SARS-CoV-2 target nucleic acids are NOT DETECTED.  The SARS-CoV-2 RNA is generally detectable in upper respiratory specimens during the acute phase of infection. The lowest concentration of SARS-CoV-2 viral copies this assay can detect is 138 copies/mL. A negative result does not preclude SARS-Cov-2 infection and should not be used as the sole basis for treatment or other patient management decisions. A negative result may occur with  improper specimen collection/handling, submission of specimen other than nasopharyngeal swab, presence of viral mutation(s) within the areas targeted by this assay, and inadequate number of  viral copies(<138 copies/mL). A negative result must be combined with clinical observations, patient history, and epidemiological information. The expected result is Negative.  Fact Sheet for Patients:  EntrepreneurPulse.com.au  Fact Sheet for Healthcare Providers:  IncredibleEmployment.be  This test is no t yet approved or cleared by the [place] and  has been authorized for detection and/or diagnosis of SARS-CoV-2 by FDA under an Emergency Use Authorization (EUA). This EUA will remain  in effect (meaning this test can be used) for the duration of the COVID-19 declaration under Section 564(b)(1) of the Act, 21 U.S.C.section 360bbb-3(b)(1), unless the authorization is terminated  or revoked sooner.       Influenza A by PCR NEGATIVE NEGATIVE Final   Influenza B by PCR NEGATIVE NEGATIVE Final    Comment: (NOTE) The Xpert Xpress SARS-CoV-2/FLU/RSV plus assay is intended as an aid in the diagnosis of influenza from Nasopharyngeal swab specimens and should not be used as a sole basis for treatment. Nasal washings and aspirates are unacceptable for Xpert Xpress SARS-CoV-2/FLU/RSV testing.  Fact Sheet for Patients: EntrepreneurPulse.com.au  Fact Sheet for Healthcare Providers: IncredibleEmployment.be  This test is not yet approved or cleared by the [place] FDA and has been authorized for detection and/or diagnosis of SARS-CoV-2 by FDA under an Emergency Use Authorization (EUA). This EUA will remain in effect (meaning this test can be used) for the duration of the COVID-19 declaration under Section 564(b)(1) of the Act, 21 U.S.C. section 360bbb-3(b)(1), unless the authorization is terminated or revoked.  Performed at [facility], [address] [place], [place] [address]   Culture, Respiratory w Gram Stain     Status: None   Collection Time: 05/20/21  6:49 PM   Specimen: Tracheal Aspirate;  Respiratory  Result Value Ref Range Status   Specimen Description TRACHEAL ASPIRATE  Final   Special Requests Immunocompromised  Final   Gram Stain   Final    FEW WBC PRESENT, PREDOMINANTLY MONONUCLEAR MODERATE GRAM POSITIVE COCCI RARE GRAM NEGATIVE RODS FEW GRAM POSITIVE RODS    Culture   Final    MODERATE Normal respiratory flora-no Staph aureus or Pseudomonas seen Performed at [facility], [address], [place], [place] [address]    Report Status 05/22/2021 FINAL  Final  Culture, blood (routine x 2)     Status: None (Preliminary result)   Collection Time: 05/20/21  8:27 PM   Specimen: BLOOD LEFT HAND  Result Value Ref Range Status   Specimen Description BLOOD LEFT HAND  Final   Special Requests   Final    BOTTLES DRAWN AEROBIC AND ANAEROBIC Blood Culture adequate volume   Culture   Final    NO GROWTH 3 DAYS Performed at [facility], [place] [address], [place], [place] [address]    Report Status PENDING  Incomplete  Culture, blood (routine x 2)     Status: None (Preliminary result)   Collection Time: 05/20/21  8:34 PM   Specimen: BLOOD  Result Value Ref Range Status   Specimen Description BLOOD RIGHT ANTECUBITAL  Final   Special Requests   Final    BOTTLES DRAWN AEROBIC AND ANAEROBIC Blood Culture adequate volume   Culture   Final    NO GROWTH 3 DAYS Performed at [facility], [place] [address] [place], [place] [address]    Report Status PENDING  Incomplete  MRSA Next Gen by PCR, Nasal     Status: None   Collection Time: 05/21/21  4:28 AM   Specimen: Nasal Mucosa; Nasal Swab  Result Value Ref Range Status   MRSA by PCR Next Gen NOT DETECTED NOT DETECTED Final    Comment: (NOTE) The GeneXpert MRSA Assay (FDA approved for NASAL specimens  only), is one component of a comprehensive MRSA colonization surveillance program. It is not intended to diagnose MRSA infection nor to guide or monitor treatment for MRSA infections. Test performance is not FDA  approved in patients less than 24 years old. Performed at [facility], [place] [address], [place], [place] [address]     RADIOLOGY STUDIES/RESULTS: VAS [place] LOWER EXTREMITY VENOUS (DVT)  Result Date: 05/22/2021  Lower Venous DVT Study Patient Name:  JAMES SENN  Date of Exam:   05/22/2021 Medical Rec #: [id number]        Accession #:    [id number] Date of Birth: [birth year]       Patient Gender: M Patient Age:   68 years Exam Location:  [facility] Procedure:      VAS [place] LOWER EXTREMITY VENOUS (DVT) Referring Phys: Noemi Chapel --------------------------------------------------------------------------------  Indications: Edema.  Comparison Study: No prior study Performing Technologist: Sharion Dove RVS  Examination Guidelines: A complete evaluation includes B-mode imaging, spectral Doppler, color Doppler, and power Doppler as needed of all accessible portions of each vessel. Bilateral testing is considered an integral part of a complete examination. Limited examinations for reoccurring indications may be performed as noted. The reflux portion of the exam is performed with the patient in reverse Trendelenburg.  +---------+---------------+---------+-----------+----------+--------------+ RIGHT    CompressibilityPhasicitySpontaneityPropertiesThrombus Aging +---------+---------------+---------+-----------+----------+--------------+ CFV      Full           Yes      Yes                                 +---------+---------------+---------+-----------+----------+--------------+ SFJ      Full                                                        +---------+---------------+---------+-----------+----------+--------------+ FV Prox  Full                                                        +---------+---------------+---------+-----------+----------+--------------+ FV Mid   Full                                                         +---------+---------------+---------+-----------+----------+--------------+ FV DistalFull                                                        +---------+---------------+---------+-----------+----------+--------------+ PFV      Full                                                        +---------+---------------+---------+-----------+----------+--------------+ POP  Full           Yes      Yes                                 +---------+---------------+---------+-----------+----------+--------------+ PTV      Full                                                        +---------+---------------+---------+-----------+----------+--------------+ PERO     Full                                                        +---------+---------------+---------+-----------+----------+--------------+   +---------+---------------+---------+-----------+----------+--------------+ LEFT     CompressibilityPhasicitySpontaneityPropertiesThrombus Aging +---------+---------------+---------+-----------+----------+--------------+ CFV      Full           Yes      Yes                                 +---------+---------------+---------+-----------+----------+--------------+ SFJ      Full                                                        +---------+---------------+---------+-----------+----------+--------------+ FV Prox  Full                                                        +---------+---------------+---------+-----------+----------+--------------+ FV Mid   Full                                                        +---------+---------------+---------+-----------+----------+--------------+ FV DistalFull                                                        +---------+---------------+---------+-----------+----------+--------------+ PFV      Full                                                         +---------+---------------+---------+-----------+----------+--------------+ POP      Full           Yes      Yes                                 +---------+---------------+---------+-----------+----------+--------------+  PTV      Full                                                        +---------+---------------+---------+-----------+----------+--------------+ PERO     Full                                                        +---------+---------------+---------+-----------+----------+--------------+     Summary: BILATERAL: - No evidence of deep vein thrombosis seen in the lower extremities, bilaterally. -No evidence of popliteal cyst, bilaterally.   *See table(s) above for measurements and observations. Electronically signed by Harold Barban MD on 05/22/2021 at 7:39:58 PM.    Final      LOS: 4 days   Oren Binet, MD  [facility]    To contact the attending provider between 7A-7P or the covering provider during after hours 7P-7A, please log into the web site [email or web address] and access using universal [place] password for that web site. If you do not have the password, please call the hospital operator.  05/23/2021, 9:55 AM

## 2021-05-23 NOTE — Progress Notes (Signed)
  Transition of Care (TOC) Screening Note   Patient Details  Name: [name] Date of Birth: Feb 14, 1941   Transition of Care [facility]) CM/SW Contact:    Cyndi Bender, RN Phone Number: 05/23/2021, 1:15 PM    Transition of Care Department [facility]) has reviewed patient and therapy recommending CIR. Patient will also have oxygen needs. We will continue to monitor patient advancement through interdisciplinary progression rounds. If new patient transition needs arise, please place a TOC consult.

## 2021-05-23 NOTE — Progress Notes (Signed)
Nutrition Follow-up  DOCUMENTATION CODES:   Not applicable  INTERVENTION:   Multivitamin w/ minerals daily Encourage good PO intake  NUTRITION DIAGNOSIS:   Inadequate oral intake related to acute illness as evidenced by NPO status. - Progressing, pt now has a diet  GOAL:   Patient will meet greater than or equal to 90% of their needs - Progressing   MONITOR:   PO intake, Supplement acceptance, Weight trends  REASON FOR ASSESSMENT:   Ventilator    ASSESSMENT:   80 yo male admitted with nausea, vomiting, abdominal pain and worsening GERD with fatigue and confusion with elevated liver enzymes, NSTEMI, obtunded requiring intubation. PMH includes CAD, GERD, HTN, HLD, iron def anemia, AAA s/p repair, bladder ca s/p TURBT, post-polio right sided atrophy  12/09 - Card Cath, Code Stroke, Intubated 12/10 - extubated 12/11 - transferred to floor  Pt reports that his appetite is good and that he was eating well PTA. Pt denies any difficulty with ordering meals.  Per EMR, pt intake includes 100% for breakfast, lunch and dinner on 12/11.  Pt with no other questions or concerns at this time.   Medications reviewed and include: Colace, SSI 0-9 units TID, Protonix, Miralax, IV antibiotics Labs reviewed: 24 hr BG trends 82-193  Nutrition Focused Physical Exam  Flowsheet Row Most Recent Value  Orbital Region Mild depletion  Upper Arm Region No depletion  Thoracic and Lumbar Region No depletion  Buccal Region Mild depletion  Temple Region No depletion  Clavicle Bone Region No depletion  Clavicle and Acromion Bone Region No depletion  Scapular Bone Region No depletion  Dorsal Hand No depletion  Patellar Region No depletion  Anterior Thigh Region No depletion  Posterior Calf Region No depletion  Edema (RD Assessment) None  Hair Reviewed  Eyes Reviewed  Mouth Reviewed  Skin Reviewed  Nails Reviewed       Diet Order:   Diet Order             Diet Heart Room service  appropriate? Yes; Fluid consistency: Thin  Diet effective now                   EDUCATION NEEDS:   No education needs have been identified at this time  Skin:  Skin Assessment: Reviewed RN Assessment  Last BM:  05/22/2021  Height:   Ht Readings from Last 1 Encounters:  05/19/21 5\' 6"  (1.676 m)    Weight:   Wt Readings from Last 1 Encounters:  05/23/21 90.2 kg    BMI:  Body mass index is 32.1 kg/m.  Estimated Nutritional Needs:   Kcal:  2000-2200  Protein:  100-115 grams  Fluid:  > 2 L    Imraan Wendell Louie Casa, RD, LDN Clinical Dietitian See [facility] for contact information.

## 2021-05-23 NOTE — Telephone Encounter (Signed)
[name] will close encounter.   Nothing further needed at this time.

## 2021-05-23 NOTE — Telephone Encounter (Signed)
Patient currently in patient. Appt made.   First available January 9th in [place].   No appt available in next two weeks.   Would you like to message sleep MD's and see if they can see him sooner? Thanks :)

## 2021-05-24 LAB — GLUCOSE, CAPILLARY
Glucose-Capillary: 121 mg/dL — ABNORMAL HIGH (ref 70–99)
Glucose-Capillary: 136 mg/dL — ABNORMAL HIGH (ref 70–99)
Glucose-Capillary: 99 mg/dL (ref 70–99)
Glucose-Capillary: 158 mg/dL — ABNORMAL HIGH (ref 70–99)

## 2021-05-24 NOTE — Progress Notes (Signed)
SATURATION QUALIFICATIONS: (This note is used to comply with regulatory documentation for home oxygen)  Patient Saturations on Room Air at Rest = 91-95%  Patient Saturations on Room Air while Ambulating = 83-86%  Patient Saturations on 2 Liters of oxygen while Ambulating = 97-100%  Please briefly explain why patient needs home oxygen: Patient cannot tolerated ambulation with out Os, He started some SOB with out O2.

## 2021-05-24 NOTE — TOC Initial Note (Addendum)
Transition of Care [facility]) - Initial/Assessment Note    Patient Details  Name: [name] MRN: [id number] Date of Birth: August 06, 1940  Transition of Care [facility]) CM/SW Contact:    Benjamin Halsted, LCSW Phone Number: 05/24/2021, 3:54 PM  Clinical Narrative:                 11am-CSW received consult for possible SNF placement at time of discharge. CSW spoke with patient and spouse at bedside. Patient reported that patient's spouse is currently unable to care for patient at their home given patients current physical needs and fall risk though he does not feel he will need to be at SNF very long. Patient expressed understanding of PT recommendation and is agreeable to SNF placement at time of discharge. Patient reports preference for [facility]. CSW discussed insurance authorization process and will provide Medicare SNF ratings list. Patient has received COVID vaccines. CSW left voicemail for [facility] to review referral.   3pm-CSW made patient and spouse aware that [facility] has declined in the hub but have not returned CSW's voicemail. CSW provided other SNF bed offers. Patient's spouse will call CSW back by 4:30pm with their decision.  4:23pm-CSW received return call from patient's spouse, they have selected [facility]. CSW requested [facility] begin insurance approval process.   Skilled Nursing Rehab Facilities-   [email or web address]   Ratings out of 5 possible   Name Address  Phone # [place] Inspection Overall  [facility] [address], [place] 5 5 2 4   [facility]  [address], [place] [phone] 4 2 5 5   [facility] [place], [place] 4 1 1 1   [place], [place] 2 2 4 4   [facility] [address], [place] 2 1 1 1   [place] [facility] [place] 3 1 4 3   [facility] [address], [place] 5 2 2 3   [facility] [address] [place] 4 1 2 1   [place] [facility] [place], [place] [phone] 5 1 2 2   [facility] [phone] [address], [place] [phone] 4 1 1 1   [facility] [address] [facility] [phone] 4 1 2 1   [facility] [place], [place] [phone] 4 1 1 1    [facility] [place], [place] 5 1 5 5   [facility] [address], [place] 2 1 3 2   [place] [address], [address] [phone] 3 1 4 3   [facility] [address] [place] 4 1 4 3   [address] [place] 4 2 1 1       Expected Discharge Plan: [place] Barriers to Discharge: Insurance Authorization   Patient Goals and CMS Choice Patient states their goals for this hospitalization and ongoing recovery are:: Rehab CMS Medicare.gov Compare Post Acute Care list provided to:: Patient Choice offered to / list presented to : Patient, Spouse  Expected Discharge Plan and Services Expected Discharge Plan: [place] In-house Referral: Clinical Social Work   Post Acute Care Choice: [place] Living arrangements for the past 2 months: [place]  Prior Living Arrangements/Services Living arrangements for the past 2 months: Single Family Home Lives with:: Spouse Patient language and need for interpreter reviewed:: Yes Do you feel safe going back to the place where you live?: Yes      Need for Family Participation in Patient Care: Yes (Comment) Care giver support system in place?: Yes (comment)   Criminal Activity/Legal Involvement Pertinent to Current Situation/Hospitalization: No - Comment as needed  Activities of Daily Living Home Assistive  Devices/Equipment: Walker (specify type) (front wheel) ADL Screening (condition at time of admission) Patient's cognitive ability adequate to safely complete daily activities?: Yes Is the patient deaf or have difficulty hearing?: Yes Does the patient have difficulty seeing, even when wearing glasses/contacts?: No Does the patient have difficulty concentrating, remembering, or making decisions?: No Patient able to express need for assistance with ADLs?: Yes Does the patient have difficulty dressing or bathing?: Yes Independently performs ADLs?: No (per wife) Communication: Needs assistance Is this a change from baseline?: Pre-admission baseline Dressing (OT): Needs assistance Is this a change from baseline?: Pre-admission baseline Grooming: Needs assistance Feeding: Independent Bathing: Needs assistance Toileting: Needs assistance In/Out Bed: Needs assistance Walks in Home: Needs assistance Does the patient have difficulty walking or climbing stairs?: Yes Weakness of Legs: Both Weakness of Arms/Hands: Right  Permission Sought/Granted Permission sought to share information with : Facility Sport and exercise psychologist, Family Supports Permission granted to share information with : Yes, Verbal Permission Granted  Share Information with NAME: [name]  Permission granted to share info w AGENCY: SNFs  Permission granted to share info w Relationship: Spouse  Permission granted to share info w Contact Information: [phone]  Emotional Assessment Appearance:: Appears stated age Attitude/Demeanor/Rapport: Engaged Affect (typically observed): Accepting, Appropriate, Pleasant Orientation: : Oriented to Self, Oriented to Place, Oriented to  Time, Oriented to Situation Alcohol / Substance Use: Not Applicable Psych Involvement: No (comment)  Admission diagnosis:  Transaminitis [R74.01] Hypoxia [R09.02] NSTEMI (non-ST elevated myocardial infarction) ([place]) [I21.4] Pneumothorax, right  [J93.9] Patient Active Problem List   Diagnosis Date Noted   Acute encephalopathy    NSTEMI (non-ST elevated myocardial infarction) ([place]) 05/19/2021   Acute respiratory failure with hypoxia ([place]) 05/19/2021   GERD (gastroesophageal reflux disease)    HOH (hard of hearing)    Elevated liver enzymes    Abdominal aortic aneurysm (AAA) 10/14/2019   Abdominal aortic aneurysm (AAA) greater than 5.5 cm in diameter in male 10/14/2019   Hematuria 09/06/2019   Bladder mass 09/06/2019   AAA (abdominal aortic aneurysm) 09/06/2019   S/P arterial stent 09/06/2019   Acute blood loss anemia 08/08/2019   Iron deficiency anemia due to chronic blood loss 08/08/2019   Thrombocytosis 08/08/2019   Symptomatic anemia 08/07/2019   Coronary artery disease 08/07/2019   Absolute anemia 05/15/2018   Guaiac positive stools 05/15/2018   Iron deficiency anemia 05/14/2018   Leukocytosis 10/09/2017   Murmur 05/15/2014   Polio 05/15/2014   HTN (hypertension) 11/23/2010   CLAUDICATION 05/19/2009   Elevated lipids 09/17/2008   CORONARY ARTERY ANEURYSM 09/17/2008   PCP:  Celene Squibb, MD Pharmacy:   [place], [place] - [address] [id number] [address] [place] [address] Phone: [phone] Fax: [phone]     Social Determinants of Health (SDOH) Interventions    Readmission Risk Interventions No flowsheet data found.

## 2021-05-24 NOTE — Care Management Important Message (Signed)
Important Message  Patient Details  Name: [name] MRN: [id number] Date of Birth: 10/01/1940   Medicare Important Message Given:  Yes     Daniqua Campoy 05/24/2021, 3:22 PM

## 2021-05-24 NOTE — Progress Notes (Signed)
Pharmacy Antibiotic Note- Follow-up  [name] is a 80 y.o. male admitted on 05/19/2021 with pneumonia.  Pharmacy has been consulted for Vancomycin/cefepime dosing.  Vancomycin was discontinued on 05/22/2021- MRSA PCR negative  Scr 0.48 (rounded to 0.8) mg/dL  Plan: Cefepime 2gm q8hr  Height: 5\' 6"  (167.6 cm) Weight: 91 kg (200 lb 9.9 oz) IBW/kg (Calculated) : 63.8  Temp (24hrs), Avg:98.3 F (36.8 C), Min:98 F (36.7 C), Max:98.7 F (37.1 C)  Recent Labs  Lab 05/19/21 1403 05/20/21 0054 05/20/21 1032 05/20/21 1043 05/21/21 0330 05/22/21 0408 05/23/21 0121  WBC  --  15.2* 15.9*  --  13.9* 13.6* 11.1*  CREATININE  --  0.67 0.49* 0.48* 0.76 0.52*  --   LATICACIDVEN 1.5  --   --   --   --   --   --      Estimated Creatinine Clearance: 77.8 mL/min (A) (by C-G formula based on SCr of 0.52 mg/dL (L)).    Allergies  Allergen Reactions   Tramadol Other (See Comments)    Excessive sleepiness/difficulty waking patient    Antimicrobials this admission:  Vancomycin 12/9 >>12/11 Cefepime 12/9 >> 12/14  Microbiology results: 12/9 BCx: no growth at 4 days 12/9 Resp Cx:   FEW WBC PRESENT, PREDOMINANTLY MONONUCLEAR  MODERATE GRAM POSITIVE COCCI  RARE GRAM NEGATIVE RODS  FEW GRAM POSITIVE RODS   Culture MODERATE Normal respiratory flora-no Staph aureus or Pseudomonas seen   12/9 MRSA PCR: negative  Thank you for allowing pharmacy to be a part of this patients care.  Lani Mendiola BS, PharmD, BCPS Clinical Pharmacist Please check AMION for all [place] numbers After 10:00 PM, call [place] [phone]

## 2021-05-24 NOTE — Evaluation (Signed)
Occupational Therapy Evaluation Patient Details Name: [name] MRN: [id number] DOB: August 29, 1940 Today's Date: 05/24/2021   History of Present Illness 80 yo male admitted 12/8 to [facility], was noted to have abd/chest pain, nausea, and then transaminitis and NSTEMI.  Went to [facility] where heart cath was done, became unresponsive afterward and was intubated.  Now on cannula of 3L with clearing of encephalopathy, new lung nodule, debility with underlying post polio syndrome.  PMHx:  HLD, falls, OSA,   Clinical Impression   PTA patient reports independent and driving. Admitted for above and limited by problem list below, including impaired balance, decreased activity tolerance, generalized weakness.  He has hx of R sided weakness due to post polio syndrome, with reports of no functional use of R UE.  He requires min assist for transfers and up to min assist for ADLs today.  Spouse reports hx of falls recently.  Patient will benefit from further OT services acutely and after dc at SNF level (initially recommended [facility], but spouse would like pt to dc close to home and asking for [place] rehab) to optimize independence, safety and return to PLOF.  Will follow.      Recommendations for follow up therapy are one component of a multi-disciplinary discharge planning process, led by the attending physician.  Recommendations may be updated based on patient status, additional functional criteria and insurance authorization.   Follow Up Recommendations  Skilled nursing-short term rehab (<3 hours/day)    Assistance Recommended at Discharge Frequent or constant Supervision/Assistance  Functional Status Assessment  Patient has had a recent decline in their functional status and demonstrates the ability to make significant improvements in function in a reasonable and predictable amount of time.  Equipment Recommendations  BSC/3in1    Recommendations for Other Services       Precautions / Restrictions  Precautions Precautions: Fall Restrictions Weight Bearing Restrictions: No      Mobility Bed Mobility               General bed mobility comments: up in chair upon arrival    Transfers Overall transfer level: Needs assistance Equipment used: 1 person hand held assist Transfers: Sit to/from Stand;Bed to chair/wheelchair/BSC Sit to Stand: Min assist     Step pivot transfers: Min assist            Balance Overall balance assessment: Needs assistance Sitting-balance support: Feet supported;No upper extremity supported Sitting balance-Leahy Scale: Good     Standing balance support: Single extremity supported;During functional activity Standing balance-Leahy Scale: Poor                             ADL either performed or assessed with clinical judgement   ADL Overall ADL's : Needs assistance/impaired     Grooming: Set up;Sitting           Upper Body Dressing : Minimal assistance;Sitting   Lower Body Dressing: Minimal assistance;Sit to/from stand Lower Body Dressing Details (indicate cue type and reason): able to don/doff socks, min assist sit to stand Toilet Transfer: Minimal assistance;Stand-pivot           Functional mobility during ADLs: Minimal assistance;Cueing for safety       Vision   Vision Assessment?: No apparent visual deficits     Perception     Praxis      Pertinent Vitals/Pain Pain Assessment: No/denies pain     Hand Dominance Left   Extremity/Trunk Assessment Upper Extremity  Assessment Upper Extremity Assessment: RUE deficits/detail;LUE deficits/detail RUE Deficits / Details: baseline deficits from post polio syndrome.  Reports unable to use functionally. LUE Deficits / Details: grossly 3+/5 MMT   Lower Extremity Assessment Lower Extremity Assessment: Defer to PT evaluation       Communication Communication Communication: HOH   Cognition Arousal/Alertness: Awake/alert Behavior During Therapy: WFL for  tasks assessed/performed Overall Cognitive Status: Impaired/Different from baseline Area of Impairment: Awareness;Problem solving                           Awareness: Emergent Problem Solving: Slow processing;Requires verbal cues General Comments: pt with slow processing and requires cueing for safety, pt HOH as well     General Comments  spouse present and supportive, on 3L VSS    Exercises     Shoulder Instructions      Home Living Family/patient expects to be discharged to:: Private residence Living Arrangements: Spouse/significant other Available Help at Discharge: Family;Available 24 hours/day Type of Home: House Home Access: Stairs to enter [employer] of Steps: 4 Entrance Stairs-Rails: Can reach both Home Layout: Able to live on main level with bedroom/bathroom;Multi-level     Bathroom Shower/Tub: Occupational psychologist: Standard     Home Equipment: Grab bars - toilet;Grab bars - tub/shower;Shower seat;Rolling Environmental consultant (2 wheels)          Prior Functioning/Environment Prior Level of Function : Independent/Modified Independent;History of Falls (last six months)               ADLs Comments: reports independent and driving        OT Problem List: Decreased strength;Decreased activity tolerance;Impaired balance (sitting and/or standing);Decreased safety awareness;Decreased knowledge of use of DME or AE;Decreased knowledge of precautions      OT Treatment/Interventions: Self-care/ADL training;Therapeutic exercise;DME and/or AE instruction;Therapeutic activities;Cognitive remediation/compensation;Patient/family education;Balance training;Energy conservation    OT Goals(Current goals can be found in the care plan section) Acute Rehab OT Goals Patient Stated Goal: to get better OT Goal Formulation: With patient Time For Goal Achievement: 06/07/21 Potential to Achieve Goals: Good  OT Frequency: Min 2X/week   Barriers to D/C:             Co-evaluation              AM-PAC OT "6 Clicks" Daily Activity     Outcome Measure Help from another person eating meals?: A Little Help from another person taking care of personal grooming?: A Little Help from another person toileting, which includes using toliet, bedpan, or urinal?: A Lot Help from another person bathing (including washing, rinsing, drying)?: A Little Help from another person to put on and taking off regular upper body clothing?: A Little Help from another person to put on and taking off regular lower body clothing?: A Little 6 Click Score: 17   End of Session Equipment Utilized During Treatment: Gait belt;Oxygen Nurse Communication: Mobility status  Activity Tolerance: Patient tolerated treatment well Patient left: in chair;with call bell/phone within reach;with family/visitor present  OT Visit Diagnosis: Other abnormalities of gait and mobility (R26.89);Muscle weakness (generalized) (M62.81)                Time: 9211-9417 OT Time Calculation (min): 13 min Charges:  OT General Charges $OT Visit: 1 Visit OT Evaluation $OT Eval Moderate Complexity: 1 Mod  Jolaine Artist, OT Acute Rehabilitation Services Pager [phone] Office [phone]   Delight Stare 05/24/2021, 9:54 AM

## 2021-05-24 NOTE — Progress Notes (Addendum)
PROGRESS NOTE        PATIENT DETAILS Name: [name] Age: 80 y.o. Sex: male Date of Birth: [birth year] Admit Date: 05/19/2021 Admitting Physician Murlean Iba, MD OAC:ZYSA, [name], MD  Brief Narrative: Patient is a 80 y.o. male with history of CAD s/p PCI to LAD in 2009, AAA s/p repair in 2021, HTN, HLD, GERD, bladder cancer-s/p TURBT, history of polio with right-sided atrophy-presented to the ED at [facility] on 12/8 with nausea, vomiting, abdominal/chest pain-he was found to have transaminitis, non-STEMI-subsequently transferred to [facility] where [place] was performed on 12/9 which showed stable single-vessel nonobstructive CAD-on return from the Cath Lab-he was found to be unresponsive-intubated and transferred to the ICU.  He underwent extensive evaluation while in the ICU-he was not thought to have acute CVA, CTA chest was negative for PE-he was subsequently extubated on 12/10 and transferred to the [facility] on 12/12.  See below for further details.  Subjective: No major issues overnight.  Denies any chest pain or shortness of breath.  Objective: Vitals: Blood pressure 132/63, pulse 62, temperature 98.2 F (36.8 C), temperature source Oral, resp. rate 17, height 5\' 6"  (1.676 m), weight 91 kg, SpO2 98 %.   Exam: Gen Exam:Alert awake-not in any distress HEENT:atraumatic, normocephalic Chest: B/L clear to auscultation anteriorly CVS:S1S2 regular Abdomen:soft non tender, non distended Extremities:no edema Neurology: Chronic right-sided weakness from prior polio infection.  L Skin: no rash   Pertinent Labs/Radiology: Recent Labs  Lab 05/19/21 1151 05/20/21 0054 05/20/21 1032 05/20/21 1043 05/21/21 0330 05/22/21 0408 05/23/21 0121  WBC 14.6* 15.2* 15.9*  --  13.9* 13.6* 11.1*  HGB 10.3* 9.1* 8.9*   < > 8.2* 8.6* 8.9*  PLT 362 297 269  --  249 282 275  NA 136 136 136   < > 140 138  --   K 4.5 4.3 5.0   < > 3.2* 3.9  --   CREATININE  1.13 0.67 0.49*   < > 0.76 0.52*  --   AST 303* 871* 593*  --  295* 117*  --   ALT 272* 709* 700*  --  554* 385*  --   ALKPHOS 81 72 75  --  65 65  --   BILITOT 0.7 0.6 0.7  --  1.5* 1.2  --    < > = values in this interval not displayed.     Assessment/Plan: Acute metabolic encephalopathy secondary to presumed hypercapnic respiratory failure requiring intubation: Suspicion is that he became unresponsive post cardiac cath due to underlying OSA that caused transient hypercapnia/hypoxemia.  CTA chest negative for PE, CT head negative for CVA, CTA head/neck negative for LVO.  Recommendations from PCCM are to pursue outpatient sleep study-Per physical therapy note-he desaturates with ambulation-therefore will likely go home with 24/7 home O2 treatment.  His encephalopathy has significantly improved-he is now awake and alert.    Acute hypoxemic/hypercapnic respiratory failure: See above-respiratory failure has improved-extubated on 12/10-Per physical therapy note on 12/12-desaturate significantly with ambulation-we will plan on home O2 on discharge.  PCCM will arrange for outpatient polysomnography.  Non-STEMI: Thought to be due to demand ischemia-LHC without any significant stenosis.  Per last cardiology note-no need to change cardiac medications-suspect can be resumed on dual antiplatelet agents.  HLD: Continue to hold statins will transaminitis improves further.  Transaminitis: Suspicion that this is either  from non-STEMI or ischemic hepatopathy.  LFTs decreasing-GI recommending repeating LFTs in 2 weeks at PCPs office.  Acute hepatitis serology was negative.  Influenza/COVID PCR was also negative.  HCAP: Complete 5 days of empiric antimicrobial therapy.  Sputum/blood cultures negative so far.  Normocytic anemia: No evidence of blood loss-no retroperitoneal hemorrhage seen on CT abdomen.  Suspect worsening anemia due to acute illness-repeat CBC periodically.  4 mm right upper lobe lung nodule:  Repeat CT in 12 months.  Debility/deconditioning: Await PT/OT-has postpolio syndrome.  Nutrition Status: Nutrition Problem: Inadequate oral intake Etiology: acute illness Signs/Symptoms: NPO status Interventions: MVI  Obesity Estimated body mass index is 32.38 kg/m as calculated from the following:   Height as of this encounter: 5\' 6"  (1.676 m).   Weight as of this encounter: 91 kg.    Procedures: 12/9>> LHC Consults: GI, cardiology, neurology, PCCM DVT Prophylaxis:Heparin Code Status:Full code  Family Communication: Spouse at bedside  Time spent: 9 minutes-Greater than 50% of this time was spent in counseling, explanation of diagnosis, planning of further management, and coordination of care.   Disposition Plan: Status is: Inpatient  Remains inpatient appropriate because: Awaiting SNF placement.   Diet: Diet Order             Diet Heart Room service appropriate? Yes; Fluid consistency: Thin  Diet effective now                     Antimicrobial agents: Anti-infectives (From admission, onward)    Start     Dose/Rate Route Frequency Ordered Stop   05/21/21 2000  vancomycin (VANCOREADY) IVPB 1250 mg/250 mL  Status:  Discontinued        1,250 mg 166.7 mL/hr over 90 Minutes Intravenous Every 24 hours 05/20/21 1918 05/22/21 1033   05/20/21 2000  ceFEPIme (MAXIPIME) 2 g in sodium chloride 0.9 % 100 mL IVPB        2 g 200 mL/hr over 30 Minutes Intravenous Every 8 hours 05/20/21 1913 05/25/21 1959   05/20/21 2000  vancomycin (VANCOREADY) IVPB 2000 mg/400 mL        2,000 mg 200 mL/hr over 120 Minutes Intravenous  Once 05/20/21 1913 05/20/21 2322        MEDICATIONS: Scheduled Meds:  aspirin EC  81 mg Oral Daily   Chlorhexidine Gluconate Cloth  6 each Topical Daily   clopidogrel  75 mg Oral Daily   docusate sodium  100 mg Oral BID   heparin injection (subcutaneous)  5,000 Units Subcutaneous Q8H   insulin aspart  0-9 Units Subcutaneous TID WC   mouth rinse   15 mL Mouth Rinse BID   multivitamin with minerals  1 tablet Oral Daily   pantoprazole  40 mg Oral BID   polyethylene glycol  17 g Oral Daily   sodium chloride flush  3 mL Intravenous Q12H   Continuous Infusions:  sodium chloride Stopped (05/22/21 1139)   ceFEPime (MAXIPIME) IV Stopped (05/24/21 0410)   PRN Meds:.sodium chloride, albuterol, nitroGLYCERIN, ondansetron **OR** ondansetron (ZOFRAN) IV, sodium chloride flush, traZODone   I have personally reviewed following labs and imaging studies  LABORATORY DATA: CBC: Recent Labs  Lab 05/19/21 1151 05/20/21 0054 05/20/21 1032 05/20/21 1222 05/21/21 0330 05/22/21 0408 05/23/21 0121  WBC 14.6* 15.2* 15.9*  --  13.9* 13.6* 11.1*  NEUTROABS 11.3* 11.4*  --   --  10.5* 10.0*  --   HGB 10.3* 9.1* 8.9* 11.6* 8.2* 8.6* 8.9*  HCT 39.6 34.6* 34.5* 34.0* 30.8* 33.2*  34.2*  MCV 82.3 80.5 83.1  --  79.4* 81.2 79.5*  PLT 362 297 269  --  249 282 275     Basic Metabolic Panel: Recent Labs  Lab 05/20/21 0054 05/20/21 1032 05/20/21 1043 05/20/21 1222 05/21/21 0330 05/22/21 0408  NA 136 136 135 138 140 138  K 4.3 5.0 5.0 4.2 3.2* 3.9  CL 96* 97* 97*  --  97* 100  CO2 36* 35* 34*  --  33* 30  GLUCOSE 131* 173* 171*  --  107* 108*  BUN 24* 25* 24*  --  30* 22  CREATININE 0.67 0.49* 0.48*  --  0.76 0.52*  CALCIUM 8.5* 7.9* 7.9*  --  8.5* 8.1*  MG 2.0  --   --   --   --   --      GFR: Estimated Creatinine Clearance: 77.8 mL/min (A) (by C-G formula based on SCr of 0.52 mg/dL (L)).  Liver Function Tests: Recent Labs  Lab 05/19/21 1151 05/20/21 0054 05/20/21 1032 05/21/21 0330 05/22/21 0408  AST 303* 871* 593* 295* 117*  ALT 272* 709* 700* 554* 385*  ALKPHOS 81 72 75 65 65  BILITOT 0.7 0.6 0.7 1.5* 1.2  PROT 8.0 6.5 6.4* 5.9* 6.1*  ALBUMIN 4.2 3.5 3.4* 3.1* 3.0*    Recent Labs  Lab 05/19/21 1151  LIPASE 32    No results for input(s): AMMONIA in the last 168 hours.  Coagulation Profile: No results for  input(s): INR, PROTIME in the last 168 hours.  Cardiac Enzymes: No results for input(s): CKTOTAL, CKMB, CKMBINDEX, TROPONINI in the last 168 hours.  BNP (last 3 results) No results for input(s): PROBNP in the last 8760 hours.  Lipid Profile: No results for input(s): CHOL, HDL, LDLCALC, TRIG, CHOLHDL, LDLDIRECT in the last 72 hours.  Thyroid Function Tests: No results for input(s): TSH, T4TOTAL, FREET4, T3FREE, THYROIDAB in the last 72 hours.  Anemia Panel: No results for input(s): VITAMINB12, FOLATE, FERRITIN, TIBC, IRON, RETICCTPCT in the last 72 hours.  Urine analysis:    Component Value Date/Time   COLORURINE YELLOW 10/09/2019 0821   APPEARANCEUR CLOUDY (A) 10/09/2019 0821   LABSPEC 1.015 10/09/2019 0821   PHURINE 6.0 10/09/2019 0821   GLUCOSEU NEGATIVE 10/09/2019 0821   HGBUR LARGE (A) 10/09/2019 0821   BILIRUBINUR NEGATIVE 10/09/2019 0821   KETONESUR NEGATIVE 10/09/2019 0821   PROTEINUR 30 (A) 10/09/2019 0821   NITRITE POSITIVE (A) 10/09/2019 0821   LEUKOCYTESUR LARGE (A) 10/09/2019 0821    Sepsis Labs: Lactic Acid, Venous    Component Value Date/Time   LATICACIDVEN 1.5 05/19/2021 1403    MICROBIOLOGY: Recent Results (from the past 240 hour(s))  Resp Panel by RT-PCR (Flu A&B, Covid) Nasopharyngeal Swab     Status: None   Collection Time: 05/19/21 12:35 PM   Specimen: Nasopharyngeal Swab; Nasopharyngeal(NP) swabs in vial transport medium  Result Value Ref Range Status   SARS Coronavirus 2 by RT PCR NEGATIVE NEGATIVE Final    Comment: (NOTE) SARS-CoV-2 target nucleic acids are NOT DETECTED.  The SARS-CoV-2 RNA is generally detectable in upper respiratory specimens during the acute phase of infection. The lowest concentration of SARS-CoV-2 viral copies this assay can detect is 138 copies/mL. A negative result does not preclude SARS-Cov-2 infection and should not be used as the sole basis for treatment or other patient management decisions. A negative result  may occur with  improper specimen collection/handling, submission of specimen other than nasopharyngeal swab, presence of viral mutation(s) within the areas  targeted by this assay, and inadequate number of viral copies(<138 copies/mL). A negative result must be combined with clinical observations, patient history, and epidemiological information. The expected result is Negative.  Fact Sheet for Patients:  EntrepreneurPulse.com.au  Fact Sheet for Healthcare Providers:  IncredibleEmployment.be  This test is no t yet approved or cleared by the [place] FDA and  has been authorized for detection and/or diagnosis of SARS-CoV-2 by FDA under an Emergency Use Authorization (EUA). This EUA will remain  in effect (meaning this test can be used) for the duration of the COVID-19 declaration under Section 564(b)(1) of the Act, 21 U.S.C.section 360bbb-3(b)(1), unless the authorization is terminated  or revoked sooner.       Influenza A by PCR NEGATIVE NEGATIVE Final   Influenza B by PCR NEGATIVE NEGATIVE Final    Comment: (NOTE) The Xpert Xpress SARS-CoV-2/FLU/RSV plus assay is intended as an aid in the diagnosis of influenza from Nasopharyngeal swab specimens and should not be used as a sole basis for treatment. Nasal washings and aspirates are unacceptable for Xpert Xpress SARS-CoV-2/FLU/RSV testing.  Fact Sheet for Patients: EntrepreneurPulse.com.au  Fact Sheet for Healthcare Providers: IncredibleEmployment.be  This test is not yet approved or cleared by the [place] FDA and has been authorized for detection and/or diagnosis of SARS-CoV-2 by FDA under an Emergency Use Authorization (EUA). This EUA will remain in effect (meaning this test can be used) for the duration of the COVID-19 declaration under Section 564(b)(1) of the Act, 21 U.S.C. section 360bbb-3(b)(1), unless the authorization is terminated  or revoked.  Performed at [facility], [address] [place], [place] [address]   Culture, Respiratory w Gram Stain     Status: None   Collection Time: 05/20/21  6:49 PM   Specimen: Tracheal Aspirate; Respiratory  Result Value Ref Range Status   Specimen Description TRACHEAL ASPIRATE  Final   Special Requests Immunocompromised  Final   Gram Stain   Final    FEW WBC PRESENT, PREDOMINANTLY MONONUCLEAR MODERATE GRAM POSITIVE COCCI RARE GRAM NEGATIVE RODS FEW GRAM POSITIVE RODS    Culture   Final    MODERATE Normal respiratory flora-no Staph aureus or Pseudomonas seen Performed at [facility], [address], [place], [place] [address]    Report Status 05/22/2021 FINAL  Final  Culture, blood (routine x 2)     Status: None (Preliminary result)   Collection Time: 05/20/21  8:27 PM   Specimen: BLOOD LEFT HAND  Result Value Ref Range Status   Specimen Description BLOOD LEFT HAND  Final   Special Requests   Final    BOTTLES DRAWN AEROBIC AND ANAEROBIC Blood Culture adequate volume   Culture   Final    NO GROWTH 4 DAYS Performed at [facility], [place] [address], [place], [place] [address]    Report Status PENDING  Incomplete  Culture, blood (routine x 2)     Status: None (Preliminary result)   Collection Time: 05/20/21  8:34 PM   Specimen: BLOOD  Result Value Ref Range Status   Specimen Description BLOOD RIGHT ANTECUBITAL  Final   Special Requests   Final    BOTTLES DRAWN AEROBIC AND ANAEROBIC Blood Culture adequate volume   Culture   Final    NO GROWTH 4 DAYS Performed at [facility], [place] [address] [place],  [address]    Report Status PENDING  Incomplete  MRSA Next Gen by PCR, Nasal     Status: None  Collection Time: 05/21/21  4:28 AM   Specimen: Nasal Mucosa; Nasal Swab  Result Value Ref Range Status   MRSA by PCR Next Gen NOT DETECTED NOT DETECTED Final    Comment: (NOTE) The GeneXpert MRSA Assay (FDA approved for NASAL specimens only), is  one component of a comprehensive MRSA colonization surveillance program. It is not intended to diagnose MRSA infection nor to guide or monitor treatment for MRSA infections. Test performance is not FDA approved in patients less than 32 years old. Performed at [facility], [place] [address] [place], [place] [address]     RADIOLOGY STUDIES/RESULTS: VAS [place] LOWER EXTREMITY VENOUS (DVT)  Result Date: 05/22/2021  Lower Venous DVT Study Patient Name:  [name]  Date of Exam:   05/22/2021 Medical Rec #: [id number]        Accession #:    [id number] Date of Birth: [birth year]       Patient Gender: M Patient Age:   34 years Exam Location:  [facility] Procedure:      VAS [place] LOWER EXTREMITY VENOUS (DVT) Referring Phys: Noemi Chapel --------------------------------------------------------------------------------  Indications: Edema.  Comparison Study: No prior study Performing Technologist: Sharion Dove RVS  Examination Guidelines: A complete evaluation includes B-mode imaging, spectral Doppler, color Doppler, and power Doppler as needed of all accessible portions of each vessel. Bilateral testing is considered an integral part of a complete examination. Limited examinations for reoccurring indications may be performed as noted. The reflux portion of the exam is performed with the patient in reverse Trendelenburg.  +---------+---------------+---------+-----------+----------+--------------+  RIGHT     Compressibility Phasicity Spontaneity Properties Thrombus Aging  +---------+---------------+---------+-----------+----------+--------------+  CFV       Full            Yes       Yes                                    +---------+---------------+---------+-----------+----------+--------------+  SFJ       Full                                                             +---------+---------------+---------+-----------+----------+--------------+  FV Prox   Full                                                              +---------+---------------+---------+-----------+----------+--------------+  FV Mid    Full                                                             +---------+---------------+---------+-----------+----------+--------------+  FV Distal Full                                                             +---------+---------------+---------+-----------+----------+--------------+  PFV       Full                                                             +---------+---------------+---------+-----------+----------+--------------+  POP       Full            Yes       Yes                                    +---------+---------------+---------+-----------+----------+--------------+  PTV       Full                                                             +---------+---------------+---------+-----------+----------+--------------+  PERO      Full                                                             +---------+---------------+---------+-----------+----------+--------------+   +---------+---------------+---------+-----------+----------+--------------+  LEFT      Compressibility Phasicity Spontaneity Properties Thrombus Aging  +---------+---------------+---------+-----------+----------+--------------+  CFV       Full            Yes       Yes                                    +---------+---------------+---------+-----------+----------+--------------+  SFJ       Full                                                             +---------+---------------+---------+-----------+----------+--------------+  FV Prox   Full                                                             +---------+---------------+---------+-----------+----------+--------------+  FV Mid    Full                                                             +---------+---------------+---------+-----------+----------+--------------+  FV Distal Full                                                              +---------+---------------+---------+-----------+----------+--------------+  PFV       Full                                                             +---------+---------------+---------+-----------+----------+--------------+  POP       Full            Yes       Yes                                    +---------+---------------+---------+-----------+----------+--------------+  PTV       Full                                                             +---------+---------------+---------+-----------+----------+--------------+  PERO      Full                                                             +---------+---------------+---------+-----------+----------+--------------+     Summary: BILATERAL: - No evidence of deep vein thrombosis seen in the lower extremities, bilaterally. -No evidence of popliteal cyst, bilaterally.   *See table(s) above for measurements and observations. Electronically signed by Harold Barban MD on 05/22/2021 at 7:39:58 PM.    Final      LOS: 5 days   Oren Binet, MD  [facility]    To contact the attending provider between 7A-7P or the covering provider during after hours 7P-7A, please log into the web site [email or web address] and access using universal [place] password for that web site. If you do not have the password, please call the hospital operator.  05/24/2021, 11:11 AM

## 2021-05-24 NOTE — NC FL2 (Signed)
[place] LEVEL OF CARE SCREENING TOOL     IDENTIFICATION  Patient Name: [name] Birthdate: Dec 12, 1940 Sex: male Admission Date (Current Location): 05/19/2021  [facility] [place] [facility] [place] Number:  [employer] and Address:  [facility] [place], [place] [address], [place], [place] [address]      Provider Number: [id number]  Attending Physician Name and Address:  Jonetta Osgood, MD  Relative Name and Phone Number:       Current Level of Care: Hospital Recommended Level of Care: [place] Prior Approval Number:    Date Approved/Denied:   PASRR Number: [id number]  Discharge Plan: SNF    Current Diagnoses: Patient Active Problem List   Diagnosis Date Noted   Acute encephalopathy    NSTEMI (non-ST elevated myocardial infarction) ([place]) 05/19/2021   Acute respiratory failure with hypoxia ([place]) 05/19/2021   GERD (gastroesophageal reflux disease)    HOH (hard of hearing)    Elevated liver enzymes    Abdominal aortic aneurysm (AAA) 10/14/2019   Abdominal aortic aneurysm (AAA) greater than 5.5 cm in diameter in male 10/14/2019   Hematuria 09/06/2019   Bladder mass 09/06/2019   AAA (abdominal aortic aneurysm) 09/06/2019   S/P arterial stent 09/06/2019   Acute blood loss anemia 08/08/2019   Iron deficiency anemia due to chronic blood loss 08/08/2019   Thrombocytosis 08/08/2019   Symptomatic anemia 08/07/2019   Coronary artery disease 08/07/2019   Absolute anemia 05/15/2018   Guaiac positive stools 05/15/2018   Iron deficiency anemia 05/14/2018   Leukocytosis 10/09/2017   Murmur 05/15/2014   Polio 05/15/2014   HTN (hypertension) 11/23/2010   CLAUDICATION 05/19/2009   Elevated lipids 09/17/2008   CORONARY ARTERY ANEURYSM 09/17/2008    Orientation RESPIRATION BLADDER Height & Weight     Self, Time, Situation, Place  O2 (3L Nasal cannula) Continent, External catheter Weight: 200 lb 9.9 oz (91 kg) Height:  5\' 6"   (167.6 cm)  BEHAVIORAL SYMPTOMS/MOOD NEUROLOGICAL BOWEL NUTRITION STATUS      Continent Diet (see dc summary)  AMBULATORY STATUS COMMUNICATION OF NEEDS Skin   Limited Assist Verbally Normal                       Personal Care Assistance Level of Assistance  Bathing, Feeding, Dressing Bathing Assistance: Maximum assistance Feeding assistance: Limited assistance Dressing Assistance: Limited assistance     Functional Limitations Info  Hearing   Hearing Info: Impaired      SPECIAL CARE FACTORS FREQUENCY  PT (By licensed PT), OT (By licensed OT)     PT Frequency: 5x/week OT Frequency: 5x/week            Contractures Contractures Info: Not present    Additional Factors Info  Code Status, Allergies, Insulin Sliding Scale Code Status Info: Full Allergies Info: Tramadol   Insulin Sliding Scale Info: see dc summary       Current Medications (05/24/2021):  This is the current hospital active medication list Current Facility-Administered Medications  Medication Dose Route Frequency Provider Last Rate Last Admin   0.9 %  sodium chloride infusion  250 mL Intravenous PRN Leonie Man, MD   Paused at 05/22/21 1139   albuterol (PROVENTIL) (2.5 MG/3ML) 0.083% nebulizer solution 2.5 mg  2.5 mg Nebulization Q6H PRN Collene Gobble, MD       aspirin EC tablet 81 mg  81 mg Oral Daily Jonetta Osgood, MD   81 mg at 05/24/21 [phone]  ceFEPIme (MAXIPIME) 2 g in sodium chloride 0.9 % 100 mL IVPB  2 g Intravenous Q8H Lyndee Leo, [facility]   Stopped at 05/24/21 0410   Chlorhexidine Gluconate Cloth 2 % PADS 6 each  6 each Topical Daily Serafina Mitchell, MD   6 each at 05/24/21 2563   clopidogrel (PLAVIX) tablet 75 mg  75 mg Oral Daily Jonetta Osgood, MD   75 mg at 05/24/21 0820   docusate sodium (COLACE) capsule 100 mg  100 mg Oral BID Spero Geralds, MD   100 mg at 05/24/21 8937   heparin injection 5,000 Units  5,000 Units Subcutaneous Q8H Spero Geralds, MD   5,000 Units at  05/24/21 3428   insulin aspart (novoLOG) injection 0-9 Units  0-9 Units Subcutaneous TID WC Leonie Man, MD   1 Units at 05/24/21 7681   MEDLINE mouth rinse  15 mL Mouth Rinse BID Spero Geralds, MD   15 mL at 05/24/21 1572   multivitamin with minerals tablet 1 tablet  1 tablet Oral Daily Jonetta Osgood, MD   1 tablet at 05/24/21 6203   nitroGLYCERIN (NITROSTAT) SL tablet 0.4 mg  0.4 mg Sublingual Q5 Min x 3 PRN Leonie Man, MD       ondansetron [facility]) tablet 4 mg  4 mg Oral Q6H PRN Leonie Man, MD       Or   ondansetron [facility]) injection 4 mg  4 mg Intravenous Q6H PRN Leonie Man, MD       pantoprazole (PROTONIX) EC tablet 40 mg  40 mg Oral BID Lyndee Leo, RPH   40 mg at 05/24/21 5597   polyethylene glycol (MIRALAX / GLYCOLAX) packet 17 g  17 g Oral Daily Lyndee Leo, RPH   17 g at 05/24/21 4163   sodium chloride flush (NS) 0.9 % injection 3 mL  3 mL Intravenous Q12H Leonie Man, MD   3 mL at 05/24/21 8453   sodium chloride flush (NS) 0.9 % injection 3 mL  3 mL Intravenous PRN Leonie Man, MD       traZODone (DESYREL) tablet 25 mg  25 mg Oral QHS PRN Lyndee Leo, [facility]         Discharge Medications: Please see discharge summary for a list of discharge medications.  Relevant Imaging Results:  Relevant Lab Results:   Additional Information SSN: [id number]. [place] COVID-19 Vaccine 02/19/2020 , 07/24/2019 , 07/03/2019  [name], LCSW

## 2021-05-24 NOTE — Progress Notes (Signed)
° °  Inpatient Rehabilitation Admissions Coordinator    I met with patient and his wife at bedside They prefer [facility] so we will sign off at this time.  Danne Baxter, RN, MSN Rehab Admissions Coordinator [phone] 05/24/2021 12:38 PM

## 2021-05-25 DIAGNOSIS — I714 Abdominal aortic aneurysm, without rupture, unspecified: Secondary | ICD-10-CM

## 2021-05-25 LAB — CULTURE, BLOOD (ROUTINE X 2)
Culture: NO GROWTH
Culture: NO GROWTH
Special Requests: ADEQUATE
Special Requests: ADEQUATE

## 2021-05-25 LAB — GLUCOSE, CAPILLARY
Glucose-Capillary: 121 mg/dL — ABNORMAL HIGH (ref 70–99)
Glucose-Capillary: 128 mg/dL — ABNORMAL HIGH (ref 70–99)
Glucose-Capillary: 151 mg/dL — ABNORMAL HIGH (ref 70–99)
Glucose-Capillary: 174 mg/dL — ABNORMAL HIGH (ref 70–99)

## 2021-05-25 NOTE — Progress Notes (Signed)
PROGRESS NOTE        PATIENT DETAILS Name: [name] Age: 80 y.o. Sex: male Date of Birth: December 21, 1940 Admit Date: 05/19/2021 Admitting Physician Murlean Iba, MD LEX:NTZG, [name], MD  Brief Narrative: Patient is a 80 y.o. male with history of CAD s/p PCI to LAD in 2009, AAA s/p repair in 2021, HTN, HLD, GERD, bladder cancer-s/p TURBT, history of polio with right-sided atrophy-presented to the ED at [facility] on 12/8 with nausea, vomiting, abdominal/chest pain-he was found to have transaminitis, non-STEMI-subsequently transferred to [facility] where [place] was performed on 12/9 which showed stable single-vessel nonobstructive CAD-on return from the Cath Lab-he was found to be unresponsive-intubated and transferred to the ICU.  He underwent extensive evaluation while in the ICU-he was not thought to have acute CVA, CTA chest was negative for PE-he was subsequently extubated on 12/10 and transferred to the [facility] service on 12/12.  See below for further details.  Subjective: Lying comfortably in bed-no major issues overnight.  Awaiting SNF bed placement.  Objective: Vitals: Blood pressure 125/68, pulse 92, temperature 97.7 F (36.5 C), temperature source Oral, resp. rate 14, height 5\' 6"  (1.676 m), weight 91 kg, SpO2 94 %.   Exam: Gen Exam:Alert awake-not in any distress HEENT:atraumatic, normocephalic Chest: B/L clear to auscultation anteriorly CVS:S1S2 regular Abdomen:soft non tender, non distended Extremities:no edema Neurology: Chronic right-sided weakness from prior polio infection.   Skin: no rash   Pertinent Labs/Radiology: Recent Labs  Lab 05/19/21 1151 05/20/21 0054 05/20/21 1032 05/20/21 1043 05/21/21 0330 05/22/21 0408 05/23/21 0121  WBC 14.6* 15.2* 15.9*  --  13.9* 13.6* 11.1*  HGB 10.3* 9.1* 8.9*   < > 8.2* 8.6* 8.9*  PLT 362 297 269  --  249 282 275  NA 136 136 136   < > 140 138  --   K 4.5 4.3 5.0   < > 3.2* 3.9  --    CREATININE 1.13 0.67 0.49*   < > 0.76 0.52*  --   AST 303* 871* 593*  --  295* 117*  --   ALT 272* 709* 700*  --  554* 385*  --   ALKPHOS 81 72 75  --  65 65  --   BILITOT 0.7 0.6 0.7  --  1.5* 1.2  --    < > = values in this interval not displayed.     Assessment/Plan: Acute metabolic encephalopathy secondary to presumed hypercapnic respiratory failure requiring intubation: Suspicion is that he became unresponsive post cardiac cath due to underlying OSA that caused transient hypercapnia/hypoxemia.  CTA chest negative for PE, CT head negative for CVA, CTA head/neck negative for LVO.  Recommendations from PCCM are to pursue outpatient sleep study-Per physical therapy note-he desaturates with ambulation-therefore will likely go home with 24/7 home O2 treatment.  His encephalopathy has significantly improved-he is now awake and alert.    Acute hypoxemic/hypercapnic respiratory failure: See above-respiratory failure has improved-extubated on 12/10-Per physical therapy note on 12/12-desaturate significantly with ambulation-we will plan on home O2 on discharge.  PCCM will arrange for outpatient polysomnography.  Non-STEMI: Thought to be due to demand ischemia-LHC without any significant stenosis.  Per last cardiology note-no need to change cardiac medications-suspect can be resumed on dual antiplatelet agents.  HLD: Continue to hold statins will transaminitis improves further.  Transaminitis: Suspicion that this is either from  non-STEMI or ischemic hepatopathy.  LFTs decreasing-GI recommending repeating LFTs in 2 weeks at PCPs office.  Acute hepatitis serology was negative.  Influenza/COVID PCR was also negative.  HCAP: Complete 5 days of empiric antimicrobial therapy with cefepime.  Sputum/blood cultures negative so far.  Normocytic anemia: No evidence of blood loss-no retroperitoneal hemorrhage seen on CT abdomen.  Suspect worsening anemia due to acute illness-repeat CBC periodically.  4 mm  right upper lobe lung nodule: Repeat CT in 12 months.  Debility/deconditioning: Await PT/OT-has postpolio syndrome.  Nutrition Status: Nutrition Problem: Inadequate oral intake Etiology: acute illness Signs/Symptoms: NPO status Interventions: MVI  Obesity Estimated body mass index is 32.38 kg/m as calculated from the following:   Height as of this encounter: 5\' 6"  (1.676 m).   Weight as of this encounter: 91 kg.    Procedures: 12/9>> LHC Consults: GI, cardiology, neurology, PCCM DVT Prophylaxis:Heparin Code Status:Full code  Family Communication: Spouse at bedside  Time spent: 15 minutes-Greater than 50% of this time was spent in counseling, explanation of diagnosis, planning of further management, and coordination of care.   Disposition Plan: Status is: Inpatient  Remains inpatient appropriate because: Awaiting SNF placement.   Diet: Diet Order             Diet Heart Room service appropriate? Yes; Fluid consistency: Thin  Diet effective now                     Antimicrobial agents: Anti-infectives (From admission, onward)    Start     Dose/Rate Route Frequency Ordered Stop   05/21/21 2000  vancomycin (VANCOREADY) IVPB 1250 mg/250 mL  Status:  Discontinued        1,250 mg 166.7 mL/hr over 90 Minutes Intravenous Every 24 hours 05/20/21 1918 05/22/21 1033   05/20/21 2000  ceFEPIme (MAXIPIME) 2 g in sodium chloride 0.9 % 100 mL IVPB        2 g 200 mL/hr over 30 Minutes Intravenous Every 8 hours 05/20/21 1913 05/25/21 1342   05/20/21 2000  vancomycin (VANCOREADY) IVPB 2000 mg/400 mL        2,000 mg 200 mL/hr over 120 Minutes Intravenous  Once 05/20/21 1913 05/20/21 2322        MEDICATIONS: Scheduled Meds:  aspirin EC  81 mg Oral Daily   Chlorhexidine Gluconate Cloth  6 each Topical Daily   clopidogrel  75 mg Oral Daily   docusate sodium  100 mg Oral BID   heparin injection (subcutaneous)  5,000 Units Subcutaneous Q8H   insulin aspart  0-9 Units  Subcutaneous TID WC   mouth rinse  15 mL Mouth Rinse BID   multivitamin with minerals  1 tablet Oral Daily   pantoprazole  40 mg Oral BID   polyethylene glycol  17 g Oral Daily   sodium chloride flush  3 mL Intravenous Q12H   Continuous Infusions:  sodium chloride Stopped (05/22/21 1139)   PRN Meds:.sodium chloride, albuterol, nitroGLYCERIN, ondansetron **OR** ondansetron (ZOFRAN) IV, sodium chloride flush, traZODone   I have personally reviewed following labs and imaging studies  LABORATORY DATA: CBC: Recent Labs  Lab 05/19/21 1151 05/20/21 0054 05/20/21 1032 05/20/21 1222 05/21/21 0330 05/22/21 0408 05/23/21 0121  WBC 14.6* 15.2* 15.9*  --  13.9* 13.6* 11.1*  NEUTROABS 11.3* 11.4*  --   --  10.5* 10.0*  --   HGB 10.3* 9.1* 8.9* 11.6* 8.2* 8.6* 8.9*  HCT 39.6 34.6* 34.5* 34.0* 30.8* 33.2* 34.2*  MCV 82.3 80.5 83.1  --  79.4* 81.2 79.5*  PLT 362 297 269  --  249 282 275     Basic Metabolic Panel: Recent Labs  Lab 05/20/21 0054 05/20/21 1032 05/20/21 1043 05/20/21 1222 05/21/21 0330 05/22/21 0408  NA 136 136 135 138 140 138  K 4.3 5.0 5.0 4.2 3.2* 3.9  CL 96* 97* 97*  --  97* 100  CO2 36* 35* 34*  --  33* 30  GLUCOSE 131* 173* 171*  --  107* 108*  BUN 24* 25* 24*  --  30* 22  CREATININE 0.67 0.49* 0.48*  --  0.76 0.52*  CALCIUM 8.5* 7.9* 7.9*  --  8.5* 8.1*  MG 2.0  --   --   --   --   --      GFR: Estimated Creatinine Clearance: 77.8 mL/min (A) (by C-G formula based on SCr of 0.52 mg/dL (L)).  Liver Function Tests: Recent Labs  Lab 05/19/21 1151 05/20/21 0054 05/20/21 1032 05/21/21 0330 05/22/21 0408  AST 303* 871* 593* 295* 117*  ALT 272* 709* 700* 554* 385*  ALKPHOS 81 72 75 65 65  BILITOT 0.7 0.6 0.7 1.5* 1.2  PROT 8.0 6.5 6.4* 5.9* 6.1*  ALBUMIN 4.2 3.5 3.4* 3.1* 3.0*    Recent Labs  Lab 05/19/21 1151  LIPASE 32    No results for input(s): AMMONIA in the last 168 hours.  Coagulation Profile: No results for input(s): INR, PROTIME  in the last 168 hours.  Cardiac Enzymes: No results for input(s): CKTOTAL, CKMB, CKMBINDEX, TROPONINI in the last 168 hours.  BNP (last 3 results) No results for input(s): PROBNP in the last 8760 hours.  Lipid Profile: No results for input(s): CHOL, HDL, LDLCALC, TRIG, CHOLHDL, LDLDIRECT in the last 72 hours.  Thyroid Function Tests: No results for input(s): TSH, T4TOTAL, FREET4, T3FREE, THYROIDAB in the last 72 hours.  Anemia Panel: No results for input(s): VITAMINB12, FOLATE, FERRITIN, TIBC, IRON, RETICCTPCT in the last 72 hours.  Urine analysis:    Component Value Date/Time   COLORURINE YELLOW 10/09/2019 0821   APPEARANCEUR CLOUDY (A) 10/09/2019 0821   LABSPEC 1.015 10/09/2019 0821   PHURINE 6.0 10/09/2019 0821   GLUCOSEU NEGATIVE 10/09/2019 0821   HGBUR LARGE (A) 10/09/2019 0821   BILIRUBINUR NEGATIVE 10/09/2019 0821   KETONESUR NEGATIVE 10/09/2019 0821   PROTEINUR 30 (A) 10/09/2019 0821   NITRITE POSITIVE (A) 10/09/2019 0821   LEUKOCYTESUR LARGE (A) 10/09/2019 0821    Sepsis Labs: Lactic Acid, Venous    Component Value Date/Time   LATICACIDVEN 1.5 05/19/2021 1403    MICROBIOLOGY: Recent Results (from the past 240 hour(s))  Resp Panel by RT-PCR (Flu A&B, Covid) Nasopharyngeal Swab     Status: None   Collection Time: 05/19/21 12:35 PM   Specimen: Nasopharyngeal Swab; Nasopharyngeal(NP) swabs in vial transport medium  Result Value Ref Range Status   SARS Coronavirus 2 by RT PCR NEGATIVE NEGATIVE Final    Comment: (NOTE) SARS-CoV-2 target nucleic acids are NOT DETECTED.  The SARS-CoV-2 RNA is generally detectable in upper respiratory specimens during the acute phase of infection. The lowest concentration of SARS-CoV-2 viral copies this assay can detect is 138 copies/mL. A negative result does not preclude SARS-Cov-2 infection and should not be used as the sole basis for treatment or other patient management decisions. A negative result may occur with  improper  specimen collection/handling, submission of specimen other than nasopharyngeal swab, presence of viral mutation(s) within the areas targeted by this assay, and inadequate number of viral  copies(<138 copies/mL). A negative result must be combined with clinical observations, patient history, and epidemiological information. The expected result is Negative.  Fact Sheet for Patients:  EntrepreneurPulse.com.au  Fact Sheet for Healthcare Providers:  IncredibleEmployment.be  This test is no t yet approved or cleared by the [place] FDA and  has been authorized for detection and/or diagnosis of SARS-CoV-2 by FDA under an Emergency Use Authorization (EUA). This EUA will remain  in effect (meaning this test can be used) for the duration of the COVID-19 declaration under Section 564(b)(1) of the Act, 21 U.S.C.section 360bbb-3(b)(1), unless the authorization is terminated  or revoked sooner.       Influenza A by PCR NEGATIVE NEGATIVE Final   Influenza B by PCR NEGATIVE NEGATIVE Final    Comment: (NOTE) The Xpert Xpress SARS-CoV-2/FLU/RSV plus assay is intended as an aid in the diagnosis of influenza from Nasopharyngeal swab specimens and should not be used as a sole basis for treatment. Nasal washings and aspirates are unacceptable for Xpert Xpress SARS-CoV-2/FLU/RSV testing.  Fact Sheet for Patients: EntrepreneurPulse.com.au  Fact Sheet for Healthcare Providers: IncredibleEmployment.be  This test is not yet approved or cleared by the [place] FDA and has been authorized for detection and/or diagnosis of SARS-CoV-2 by FDA under an Emergency Use Authorization (EUA). This EUA will remain in effect (meaning this test can be used) for the duration of the COVID-19 declaration under Section 564(b)(1) of the Act, 21 U.S.C. section 360bbb-3(b)(1), unless the authorization is terminated or revoked.  Performed at  [facility], [address] [place], [place] [address]   Culture, Respiratory w Gram Stain     Status: None   Collection Time: 05/20/21  6:49 PM   Specimen: Tracheal Aspirate; Respiratory  Result Value Ref Range Status   Specimen Description TRACHEAL ASPIRATE  Final   Special Requests Immunocompromised  Final   Gram Stain   Final    FEW WBC PRESENT, PREDOMINANTLY MONONUCLEAR MODERATE GRAM POSITIVE COCCI RARE GRAM NEGATIVE RODS FEW GRAM POSITIVE RODS    Culture   Final    MODERATE Normal respiratory flora-no Staph aureus or Pseudomonas seen Performed at [facility], [address] [place], [place] [address]    Report Status 05/22/2021 FINAL  Final  Culture, blood (routine x 2)     Status: None   Collection Time: 05/20/21  8:27 PM   Specimen: BLOOD LEFT HAND  Result Value Ref Range Status   Specimen Description BLOOD LEFT HAND  Final   Special Requests   Final    BOTTLES DRAWN AEROBIC AND ANAEROBIC Blood Culture adequate volume   Culture   Final    NO GROWTH 5 DAYS Performed at [facility], [place] [address] [place], [place] [address]    Report Status 05/25/2021 FINAL  Final  Culture, blood (routine x 2)     Status: None   Collection Time: 05/20/21  8:34 PM   Specimen: BLOOD  Result Value Ref Range Status   Specimen Description BLOOD RIGHT ANTECUBITAL  Final   Special Requests   Final    BOTTLES DRAWN AEROBIC AND ANAEROBIC Blood Culture adequate volume   Culture   Final    NO GROWTH 5 DAYS Performed at [facility], [place] [address], [place], [place] [address]    Report Status 05/25/2021 FINAL  Final  MRSA Next Gen by PCR, Nasal     Status: None   Collection Time: 05/21/21  4:28 AM   Specimen: Nasal  Mucosa; Nasal Swab  Result Value Ref Range Status   MRSA by PCR Next Gen NOT DETECTED NOT DETECTED Final    Comment: (NOTE) The GeneXpert MRSA Assay (FDA approved for NASAL specimens only), is one component of a comprehensive MRSA colonization  surveillance program. It is not intended to diagnose MRSA infection nor to guide or monitor treatment for MRSA infections. Test performance is not FDA approved in patients less than 31 years old. Performed at [facility], [place] [address], [place],  [address]     RADIOLOGY STUDIES/RESULTS: No results found.   LOS: 6 days   Oren Binet, MD  [facility]    To contact the attending provider between 7A-7P or the covering provider during after hours 7P-7A, please log into the web site [email or web address] and access using universal [place] password for that web site. If you do not have the password, please call the hospital operator.  05/25/2021, 2:33 PM

## 2021-05-25 NOTE — Progress Notes (Signed)
Physical Therapy Treatment Patient Details Name: [name] MRN: [id number] DOB: Jun 10, 1941 Today's Date: 05/25/2021   History of Present Illness 80 yo male admitted 12/8 to [facility], was noted to have abd/chest pain, nausea, and then transaminitis and NSTEMI.  Went to [facility] where heart cath was done, became unresponsive afterward and was intubated.  Now on cannula of 3L with clearing of encephalopathy, new lung nodule, debility with underlying post polio syndrome.  PMHx:  HLD, falls, OSA,    PT Comments    Pt tolerates treatment well with increased ambulation distance. Pt utilizing hemi-walker during session, however may benefit from use of quad cane vs SPC as the pt rolls hemi-walker from front to back legs during session. Pt will benefit from continued acute PT services to improve balance and reduce falls risk. PT will attempt gait training with quad cane vs SPC next session.   Recommendations for follow up therapy are one component of a multi-disciplinary discharge planning process, led by the attending physician.  Recommendations may be updated based on patient status, additional functional criteria and insurance authorization.  Follow Up Recommendations  Skilled nursing-short term rehab (<3 hours/day) (pt/family declined AIR, opting for rehab closer to home)     Assistance Recommended at Discharge Frequent or constant Supervision/Assistance  Equipment Recommendations   (TBD, possible quad cane)    Recommendations for Other Services       Precautions / Restrictions Precautions Precautions: Fall Precaution Comments: pt reports no falls in past 3 months, conflicting reports from last PT note stating 8 falls in last 6 weeks Restrictions Weight Bearing Restrictions: No     Mobility  Bed Mobility                    Transfers Overall transfer level: Needs assistance Equipment used: Hemi-walker Transfers: Sit to/from Stand Sit to Stand: Supervision                 Ambulation/Gait Ambulation/Gait assistance: Min guard Gait Distance (Feet): 100 Feet Assistive device: Hemi-walker Gait Pattern/deviations: Step-through pattern;Decreased stance time - right Gait velocity: reduced Gait velocity interpretation: <1.8 ft/sec, indicate of risk for recurrent falls   General Gait Details: pt with slowed step-through gait, reduced stance time RLE, pt rolling feet of hemi-walker rather than walking with all 4 on floor simultaneously (PT provides cues for correction at end of gait trial). Pt with increased sway and mild loss of balance with turns   Marine scientist [place] (Stroke Patients Only)       Balance Overall balance assessment: Needs assistance Sitting-balance support: No upper extremity supported;Feet supported Sitting balance-Leahy Scale: Good     Standing balance support: Single extremity supported;Reliant on assistive device for balance Standing balance-Leahy Scale: Poor                              Cognition Arousal/Alertness: Awake/alert Behavior During Therapy: WFL for tasks assessed/performed Overall Cognitive Status: Impaired/Different from baseline Area of Impairment: Safety/judgement                         Safety/Judgement: Decreased awareness of safety;Decreased awareness of deficits              Exercises      General Comments General comments (skin integrity, edema, etc.): pt on 3L [place] upon PT  arrival, PT weans to room air with sats from 89-92%. Pt ambulates on 1L [place] with sats from 91-92% when reliable waveform present. PT leaves pt on 1L [place] at end of session. Pt tachycardic up to 134 observed during session      Pertinent Vitals/Pain Pain Assessment: No/denies pain    Home Living                          Prior Function            PT Goals (current goals can now be found in the care plan section) Acute Rehab PT Goals Patient Stated  Goal: to get home Progress towards PT goals: Progressing toward goals    Frequency    Min 3X/week      PT Plan Current plan remains appropriate    Co-evaluation              AM-PAC PT "6 Clicks" Mobility   Outcome Measure  Help needed turning from your back to your side while in a flat bed without using bedrails?: A Little Help needed moving from lying on your back to sitting on the side of a flat bed without using bedrails?: A Little Help needed moving to and from a bed to a chair (including a wheelchair)?: A Little Help needed standing up from a chair using your arms (e.g., wheelchair or bedside chair)?: A Little Help needed to walk in hospital room?: A Little Help needed climbing 3-5 steps with a railing? : Total 6 Click Score: 16    End of Session Equipment Utilized During Treatment: Oxygen;Gait belt Activity Tolerance: Patient tolerated treatment well Patient left: in chair;with call bell/phone within reach;with chair alarm set;with family/visitor present Nurse Communication: Mobility status PT Visit Diagnosis: Unsteadiness on feet (R26.81);Muscle weakness (generalized) (M62.81);Difficulty in walking, not elsewhere classified (R26.2)     Time: 1031-5945 PT Time Calculation (min) (ACUTE ONLY): 28 min  Charges:  $Gait Training: 8-22 mins $Therapeutic Activity: 8-22 mins                     Zenaida Niece, PT, DPT Acute Rehabilitation Pager: [phone] Office [place] Thania Woodlief 05/25/2021, 4:05 PM

## 2021-05-25 NOTE — Plan of Care (Signed)
°  Problem: Education: Goal: Understanding of CV disease, CV risk reduction, and recovery process will improve Outcome: Progressing Goal: Individualized Educational Video(s) Outcome: Progressing   Problem: Activity: Goal: Ability to return to baseline activity level will improve Outcome: Progressing   Problem: Cardiovascular: Goal: Ability to achieve and maintain adequate cardiovascular perfusion will improve Outcome: Progressing   

## 2021-05-26 LAB — GLUCOSE, CAPILLARY
Glucose-Capillary: 104 mg/dL — ABNORMAL HIGH (ref 70–99)
Glucose-Capillary: 142 mg/dL — ABNORMAL HIGH (ref 70–99)
Glucose-Capillary: 154 mg/dL — ABNORMAL HIGH (ref 70–99)
Glucose-Capillary: 110 mg/dL — ABNORMAL HIGH (ref 70–99)

## 2021-05-26 NOTE — Progress Notes (Signed)
Occupational Therapy Treatment Patient Details Name: BALTHAZAR DOOLY MRN: [id number] DOB: [birth year] Today's Date: 05/26/2021   History of present illness 80 yo male admitted 12/8 to [facility], was noted to have abd/chest pain, nausea, and then transaminitis and NSTEMI.  Went to [facility] where heart cath was done, became unresponsive afterward and was intubated.  Now on cannula of 3L with clearing of encephalopathy, new lung nodule, debility with underlying post polio syndrome.  PMHx:  HLD, falls, OSA,   OT comments  Patient received in recliner with family present. Quad cane used for mobility to increase safety with mobility and tranfers. Patient was min assist to ambulate to sink with quad cane and perform grooming tasks for balance and safe use of cane. Patient asked to perform more mobility and ambulated in hallway with min assist for balance and frequent cues for safe quad cane use. Patient is making good progress and could benefit from further OT services.    Recommendations for follow up therapy are one component of a multi-disciplinary discharge planning process, led by the attending physician.  Recommendations may be updated based on patient status, additional functional criteria and insurance authorization.    Follow Up Recommendations  Skilled nursing-short term rehab (<3 hours/day)    Assistance Recommended at Discharge Frequent or constant Supervision/Assistance  Equipment Recommendations  BSC/3in1    Recommendations for Other Services      Precautions / Restrictions Precautions Precautions: Fall Restrictions Weight Bearing Restrictions: No       Mobility Bed Mobility               General bed mobility comments: up in chair upon arrival    Transfers Overall transfer level: Needs assistance Equipment used: Quad cane Transfers: Sit to/from Stand Sit to Stand: Supervision   Step pivot transfers: Min assist       General transfer comment: min assist due to assistance  with quad cane and balance     Balance Overall balance assessment: Needs assistance Sitting-balance support: No upper extremity supported;Feet supported Sitting balance-Leahy Scale: Good     Standing balance support: Single extremity supported;Reliant on assistive device for balance Standing balance-Leahy Scale: Poor Standing balance comment: stood at sink and was able to turn on water and wash hands and face without support and min assist for balance                           ADL either performed or assessed with clinical judgement   ADL Overall ADL's : Needs assistance/impaired     Grooming: Minimal assistance;Standing;Wash/dry hands;Wash/dry face Grooming Details (indicate cue type and reason): used LUE to wash face and hands standing at sink with assistance for balance                             Functional mobility during ADLs: Minimal assistance;Cueing for safety General ADL Comments: used quad cane for mobility    Extremity/Trunk Assessment Upper Extremity Assessment RUE Deficits / Details: baseline deficits from post polio syndrome.  Reports unable to use functionally. RUE Coordination: decreased fine motor;decreased gross motor LUE Deficits / Details: grossly 3+/5 MMT            Vision       Perception     Praxis      Cognition Arousal/Alertness: Awake/alert Behavior During Therapy: WFL for tasks assessed/performed Overall Cognitive Status: Impaired/Different from baseline Area of Impairment: Safety/judgement  Safety/Judgement: Decreased awareness of safety;Decreased awareness of deficits Awareness: Emergent Problem Solving: Slow processing;Requires verbal cues General Comments: frequent cues for safety and correct use of quad cane          Exercises     Shoulder Instructions       General Comments      Pertinent Vitals/ Pain       Pain Assessment: No/denies pain  Home Living                                           Prior Functioning/Environment              Frequency  Min 2X/week        Progress Toward Goals  OT Goals(current goals can now be found in the care plan section)  Progress towards OT goals: Progressing toward goals  Acute Rehab OT Goals Patient Stated Goal: go to rehab OT Goal Formulation: With patient/family Time For Goal Achievement: 06/07/21 Potential to Achieve Goals: Good ADL Goals Pt Will Perform Grooming: with modified independence;sitting;standing Pt Will Perform Lower Body Dressing: with modified independence;sit to/from stand Pt Will Transfer to Toilet: with modified independence;ambulating Pt Will Perform Toileting - Clothing Manipulation and hygiene: with modified independence;sit to/from stand Pt Will Perform Tub/Shower Transfer: Shower transfer;with modified independence;shower seat;ambulating;grab bars  Plan Discharge plan remains appropriate    Co-evaluation                 AM-PAC OT "6 Clicks" Daily Activity     Outcome Measure   Help from another person eating meals?: A Little Help from another person taking care of personal grooming?: A Little Help from another person toileting, which includes using toliet, bedpan, or urinal?: A Lot Help from another person bathing (including washing, rinsing, drying)?: A Little Help from another person to put on and taking off regular upper body clothing?: A Little Help from another person to put on and taking off regular lower body clothing?: A Little 6 Click Score: 17    End of Session Equipment Utilized During Treatment: Oxygen;Gait belt;Other (comment) (quad cane)  OT Visit Diagnosis: Other abnormalities of gait and mobility (R26.89);Muscle weakness (generalized) (M62.81)   Activity Tolerance Patient tolerated treatment well   Patient Left in chair;with call bell/phone within reach;with family/visitor present   Nurse Communication Mobility  status        Time: 5102-5852 OT Time Calculation (min): 31 min  Charges: OT General Charges $OT Visit: 1 Visit OT Treatments $Self Care/Home Management : 8-22 mins $Therapeutic Activity: 8-22 mins  Lodema Hong, [place]  Pager [phone] Office [phone]   Trixie Dredge 05/26/2021, 10:58 AM

## 2021-05-26 NOTE — TOC Progression Note (Addendum)
Transition of Care [facility]) - Progression Note    Patient Details  Name: [name] MRN: [id number] Date of Birth: Jul 25, 1940  Transition of [facility]) CM/SW [place], LCSW Phone Number: 05/26/2021, 12:09 PM  Clinical Narrative:    12pm-CSW updated patient's spouse at bedside. [facility] sent updated clinicals to [employer].   3pm-CSW received notification that [name] has received insurance approval and that patient does not need a COVID test. MD aware.  3:08pm-[facility] contacted CSW back and stated their housekeeping has left and the room is not clean. CSW will send patient in the morning. MD aware.    Expected Discharge Plan: Skilled Nursing Facility Barriers to Discharge: Insurance Authorization  Expected Discharge Plan and Services Expected Discharge Plan: [place] In-house Referral: Clinical Social Work   Post Acute Care Choice: [place] Living arrangements for the past 2 months: Single Family Home                                       Social Determinants of Health (SDOH) Interventions    Readmission Risk Interventions No flowsheet data found.

## 2021-05-26 NOTE — Progress Notes (Signed)
PROGRESS NOTE        PATIENT DETAILS Name: [name] Age: 80 y.o. Sex: male Date of Birth: Jun 29, 1940 Admit Date: 05/19/2021 Admitting Physician Benjamin Iba, MD FYB:OFBP, Benjamin Areola, MD  Brief Narrative: Patient is a 80 y.o. male with history of CAD s/p PCI to LAD in 2009, AAA s/p repair in 2021, HTN, HLD, GERD, bladder cancer-s/p TURBT, history of polio with right-sided atrophy-presented to the ED at [facility] on 12/8 with nausea, vomiting, abdominal/chest pain-he was found to have transaminitis, non-STEMI-subsequently transferred to [facility] where Tat[place] was performed on 12/9 which showed stable single-vessel nonobstructive CAD-on return from the Cath Lab-he was found to be unresponsive-intubated and transferred to the ICU.  He underwent extensive evaluation while in the ICU-he was not thought to have acute CVA, CTA chest was negative for PE-he was subsequently extubated on 12/10 and transferred to the [facility] service on 12/12.  See below for further details.  Subjective: No major issues overnight-spouse at bedside.  Awaiting SNF placement.  Objective: Vitals: Blood pressure (!) 113/56, pulse 68, temperature 98.5 F (36.9 C), temperature source Oral, resp. rate 17, height 5\' 6"  (1.676 m), weight 91 kg, SpO2 100 %.   Exam: Gen Exam:Alert awake-not in any distress HEENT:atraumatic, normocephalic Chest: B/L clear to auscultation anteriorly CVS:S1S2 regular Abdomen:soft non tender, non distended Extremities:no edema Neurology: Chronic right-sided weakness from prior polio infection.   Skin: no rash   Pertinent Labs/Radiology: Recent Labs  Lab 05/20/21 0054 05/20/21 1032 05/20/21 1043 05/21/21 0330 05/22/21 0408 05/23/21 0121  WBC 15.2* 15.9*  --  13.9* 13.6* 11.1*  HGB 9.1* 8.9*   < > 8.2* 8.6* 8.9*  PLT 297 269  --  249 282 275  NA 136 136   < > 140 138  --   K 4.3 5.0   < > 3.2* 3.9  --   CREATININE 0.67 0.49*   < > 0.76 0.52*  --   AST  871* 593*  --  295* 117*  --   ALT 709* 700*  --  554* 385*  --   ALKPHOS 72 75  --  65 65  --   BILITOT 0.6 0.7  --  1.5* 1.2  --    < > = values in this interval not displayed.     Assessment/Plan: Acute metabolic encephalopathy secondary to presumed hypercapnic respiratory failure requiring intubation: Suspicion is that he became unresponsive post cardiac cath due to underlying OSA that caused transient hypercapnia/hypoxemia.  CTA chest negative for PE, CT head negative for CVA, CTA head/neck negative for LVO.  Recommendations from PCCM are to pursue outpatient sleep study-Per physical therapy note-he desaturates with ambulation-therefore will likely go home with 24/7 home O2 treatment.  His encephalopathy has significantly improved-he is now awake and alert.    Acute hypoxemic/hypercapnic respiratory failure: See above-respiratory failure has improved-extubated on 12/10-Per physical therapy note on 12/12-desaturate significantly with ambulation-we will plan on home O2 on discharge.  PCCM will arrange for outpatient polysomnography.  Non-STEMI: Thought to be due to demand ischemia-LHC without any significant stenosis.  Per last cardiology note-no need to change cardiac medications-suspect can be resumed on dual antiplatelet agents.  HLD: Continue to hold statins will transaminitis improves further.  Transaminitis: Suspicion that this is either from non-STEMI or ischemic hepatopathy.  LFTs decreasing-GI recommending repeating LFTs in 2 weeks  at PCPs office.  Acute hepatitis serology was negative.  Influenza/COVID PCR was also negative.  HCAP: Complete 5 days of empiric antimicrobial therapy with cefepime.  Sputum/blood cultures negative so far.  Normocytic anemia: No evidence of blood loss-no retroperitoneal hemorrhage seen on CT abdomen.  Suspect worsening anemia due to acute illness-repeat CBC periodically.  4 mm right upper lobe lung nodule: Repeat CT in 12  months.  Debility/deconditioning: Await PT/OT-has postpolio syndrome.  Nutrition Status: Nutrition Problem: Inadequate oral intake Etiology: acute illness Signs/Symptoms: NPO status Interventions: MVI  Obesity Estimated body mass index is 32.38 kg/m as calculated from the following:   Height as of this encounter: 5\' 6"  (1.676 m).   Weight as of this encounter: 91 kg.    Procedures: 12/9>> LHC Consults: GI, cardiology, neurology, PCCM DVT Prophylaxis:Heparin Code Status:Full code  Family Communication: Spouse at bedside  Time spent: 15 minutes-Greater than 50% of this time was spent in counseling, explanation of diagnosis, planning of further management, and coordination of care.   Disposition Plan: Status is: Inpatient  Remains inpatient appropriate because: Awaiting SNF placement.   Diet: Diet Order             Diet Heart Room service appropriate? Yes; Fluid consistency: Thin  Diet effective now                     Antimicrobial agents: Anti-infectives (From admission, onward)    Start     Dose/Rate Route Frequency Ordered Stop   05/21/21 2000  vancomycin (VANCOREADY) IVPB 1250 mg/250 mL  Status:  Discontinued        1,250 mg 166.7 mL/hr over 90 Minutes Intravenous Every 24 hours 05/20/21 1918 05/22/21 1033   05/20/21 2000  ceFEPIme (MAXIPIME) 2 g in sodium chloride 0.9 % 100 mL IVPB        2 g 200 mL/hr over 30 Minutes Intravenous Every 8 hours 05/20/21 1913 05/25/21 1900   05/20/21 2000  vancomycin (VANCOREADY) IVPB 2000 mg/400 mL        2,000 mg 200 mL/hr over 120 Minutes Intravenous  Once 05/20/21 1913 05/20/21 2322        MEDICATIONS: Scheduled Meds:  aspirin EC  81 mg Oral Daily   Chlorhexidine Gluconate Cloth  6 each Topical Daily   clopidogrel  75 mg Oral Daily   docusate sodium  100 mg Oral BID   heparin injection (subcutaneous)  5,000 Units Subcutaneous Q8H   insulin aspart  0-9 Units Subcutaneous TID WC   mouth rinse  15 mL Mouth  Rinse BID   multivitamin with minerals  1 tablet Oral Daily   pantoprazole  40 mg Oral BID   polyethylene glycol  17 g Oral Daily   sodium chloride flush  3 mL Intravenous Q12H   Continuous Infusions:  sodium chloride Stopped (05/22/21 1139)   PRN Meds:.sodium chloride, albuterol, nitroGLYCERIN, ondansetron **OR** ondansetron (ZOFRAN) IV, sodium chloride flush, traZODone   I have personally reviewed following labs and imaging studies  LABORATORY DATA: CBC: Recent Labs  Lab 05/20/21 0054 05/20/21 1032 05/20/21 1222 05/21/21 0330 05/22/21 0408 05/23/21 0121  WBC 15.2* 15.9*  --  13.9* 13.6* 11.1*  NEUTROABS 11.4*  --   --  10.5* 10.0*  --   HGB 9.1* 8.9* 11.6* 8.2* 8.6* 8.9*  HCT 34.6* 34.5* 34.0* 30.8* 33.2* 34.2*  MCV 80.5 83.1  --  79.4* 81.2 79.5*  PLT 297 269  --  249 282 275     Basic  Metabolic Panel: Recent Labs  Lab 05/20/21 0054 05/20/21 1032 05/20/21 1043 05/20/21 1222 05/21/21 0330 05/22/21 0408  NA 136 136 135 138 140 138  K 4.3 5.0 5.0 4.2 3.2* 3.9  CL 96* 97* 97*  --  97* 100  CO2 36* 35* 34*  --  33* 30  GLUCOSE 131* 173* 171*  --  107* 108*  BUN 24* 25* 24*  --  30* 22  CREATININE 0.67 0.49* 0.48*  --  0.76 0.52*  CALCIUM 8.5* 7.9* 7.9*  --  8.5* 8.1*  MG 2.0  --   --   --   --   --      GFR: Estimated Creatinine Clearance: 77.8 mL/min (A) (by C-G formula based on SCr of 0.52 mg/dL (L)).  Liver Function Tests: Recent Labs  Lab 05/20/21 0054 05/20/21 1032 05/21/21 0330 05/22/21 0408  AST 871* 593* 295* 117*  ALT 709* 700* 554* 385*  ALKPHOS 72 75 65 65  BILITOT 0.6 0.7 1.5* 1.2  PROT 6.5 6.4* 5.9* 6.1*  ALBUMIN 3.5 3.4* 3.1* 3.0*    No results for input(s): LIPASE, AMYLASE in the last 168 hours.  No results for input(s): AMMONIA in the last 168 hours.  Coagulation Profile: No results for input(s): INR, PROTIME in the last 168 hours.  Cardiac Enzymes: No results for input(s): CKTOTAL, CKMB, CKMBINDEX, TROPONINI in the last  168 hours.  BNP (last 3 results) No results for input(s): PROBNP in the last 8760 hours.  Lipid Profile: No results for input(s): CHOL, HDL, LDLCALC, TRIG, CHOLHDL, LDLDIRECT in the last 72 hours.  Thyroid Function Tests: No results for input(s): TSH, T4TOTAL, FREET4, T3FREE, THYROIDAB in the last 72 hours.  Anemia Panel: No results for input(s): VITAMINB12, FOLATE, FERRITIN, TIBC, IRON, RETICCTPCT in the last 72 hours.  Urine analysis:    Component Value Date/Time   COLORURINE YELLOW 10/09/2019 0821   APPEARANCEUR CLOUDY (A) 10/09/2019 0821   LABSPEC 1.015 10/09/2019 0821   PHURINE 6.0 10/09/2019 0821   GLUCOSEU NEGATIVE 10/09/2019 0821   HGBUR LARGE (A) 10/09/2019 0821   BILIRUBINUR NEGATIVE 10/09/2019 0821   KETONESUR NEGATIVE 10/09/2019 0821   PROTEINUR 30 (A) 10/09/2019 0821   NITRITE POSITIVE (A) 10/09/2019 0821   LEUKOCYTESUR LARGE (A) 10/09/2019 0821    Sepsis Labs: Lactic Acid, Venous    Component Value Date/Time   LATICACIDVEN 1.5 05/19/2021 1403    MICROBIOLOGY: Recent Results (from the past 240 hour(s))  Resp Panel by RT-PCR (Flu A&B, Covid) Nasopharyngeal Swab     Status: None   Collection Time: 05/19/21 12:35 PM   Specimen: Nasopharyngeal Swab; Nasopharyngeal(NP) swabs in vial transport medium  Result Value Ref Range Status   SARS Coronavirus 2 by RT PCR NEGATIVE NEGATIVE Final    Comment: (NOTE) SARS-CoV-2 target nucleic acids are NOT DETECTED.  The SARS-CoV-2 RNA is generally detectable in upper respiratory specimens during the acute phase of infection. The lowest concentration of SARS-CoV-2 viral copies this assay can detect is 138 copies/mL. A negative result does not preclude SARS-Cov-2 infection and should not be used as the sole basis for treatment or other patient management decisions. A negative result may occur with  improper specimen collection/handling, submission of specimen other than nasopharyngeal swab, presence of viral mutation(s)  within the areas targeted by this assay, and inadequate number of viral copies(<138 copies/mL). A negative result must be combined with clinical observations, patient history, and epidemiological information. The expected result is Negative.  Fact Sheet for Patients:  EntrepreneurPulse.com.au  Fact Sheet for Healthcare Providers:  IncredibleEmployment.be  This test is no t yet approved or cleared by the [place] FDA and  has been authorized for detection and/or diagnosis of SARS-CoV-2 by FDA under an Emergency Use Authorization (EUA). This EUA will remain  in effect (meaning this test can be used) for the duration of the COVID-19 declaration under Section 564(b)(1) of the Act, 21 U.S.C.section 360bbb-3(b)(1), unless the authorization is terminated  or revoked sooner.       Influenza A by PCR NEGATIVE NEGATIVE Final   Influenza B by PCR NEGATIVE NEGATIVE Final    Comment: (NOTE) The Xpert Xpress SARS-CoV-2/FLU/RSV plus assay is intended as an aid in the diagnosis of influenza from Nasopharyngeal swab specimens and should not be used as a sole basis for treatment. Nasal washings and aspirates are unacceptable for Xpert Xpress SARS-CoV-2/FLU/RSV testing.  Fact Sheet for Patients: EntrepreneurPulse.com.au  Fact Sheet for Healthcare Providers: IncredibleEmployment.be  This test is not yet approved or cleared by the [place] FDA and has been authorized for detection and/or diagnosis of SARS-CoV-2 by FDA under an Emergency Use Authorization (EUA). This EUA will remain in effect (meaning this test can be used) for the duration of the COVID-19 declaration under Section 564(b)(1) of the Act, 21 U.S.C. section 360bbb-3(b)(1), unless the authorization is terminated or revoked.  Performed at [facility], [address] [place], [place] [address]   Culture, Respiratory w Gram Stain     Status: None    Collection Time: 05/20/21  6:49 PM   Specimen: Tracheal Aspirate; Respiratory  Result Value Ref Range Status   Specimen Description TRACHEAL ASPIRATE  Final   Special Requests Immunocompromised  Final   Gram Stain   Final    FEW WBC PRESENT, PREDOMINANTLY MONONUCLEAR MODERATE GRAM POSITIVE COCCI RARE GRAM NEGATIVE RODS FEW GRAM POSITIVE RODS    Culture   Final    MODERATE Normal respiratory flora-no Staph aureus or Pseudomonas seen Performed at [facility], [address] [place], [place] [address]    Report Status 05/22/2021 FINAL  Final  Culture, blood (routine x 2)     Status: None   Collection Time: 05/20/21  8:27 PM   Specimen: BLOOD LEFT HAND  Result Value Ref Range Status   Specimen Description BLOOD LEFT HAND  Final   Special Requests   Final    BOTTLES DRAWN AEROBIC AND ANAEROBIC Blood Culture adequate volume   Culture   Final    NO GROWTH 5 DAYS Performed at [facility], [place] [address] [place], [place] [address]    Report Status 05/25/2021 FINAL  Final  Culture, blood (routine x 2)     Status: None   Collection Time: 05/20/21  8:34 PM   Specimen: BLOOD  Result Value Ref Range Status   Specimen Description BLOOD RIGHT ANTECUBITAL  Final   Special Requests   Final    BOTTLES DRAWN AEROBIC AND ANAEROBIC Blood Culture adequate volume   Culture   Final    NO GROWTH 5 DAYS Performed at [facility], [place] [address] [place], [place] [address]    Report Status 05/25/2021 FINAL  Final  MRSA Next Gen by PCR, Nasal     Status: None   Collection Time: 05/21/21  4:28 AM   Specimen: Nasal Mucosa; Nasal Swab  Result Value Ref Range Status   MRSA by PCR Next Gen NOT DETECTED NOT DETECTED Final    Comment: (NOTE) The  GeneXpert MRSA Assay (FDA approved for NASAL specimens only), is one component of a comprehensive MRSA colonization surveillance program. It is not intended to diagnose MRSA infection nor to guide or monitor treatment for MRSA  infections. Test performance is not FDA approved in patients less than 47 years old. Performed at [facility], [place] [address] [place], [place] [address]     RADIOLOGY STUDIES/RESULTS: No results found.   LOS: 7 days   Oren Binet, MD  [facility]    To contact the attending provider between 7A-7P or the covering provider during after hours 7P-7A, please log into the web site [email or web address] and access using universal [place] password for that web site. If you do not have the password, please call the hospital operator.  05/26/2021, 1:39 PM

## 2021-05-27 ENCOUNTER — Other Ambulatory Visit (HOSPITAL_COMMUNITY): Payer: Medicare HMO

## 2021-05-27 ENCOUNTER — Encounter (HOSPITAL_COMMUNITY): Payer: Self-pay

## 2021-05-27 DIAGNOSIS — B91 Sequelae of poliomyelitis: Secondary | ICD-10-CM | POA: Diagnosis not present

## 2021-05-27 DIAGNOSIS — D500 Iron deficiency anemia secondary to blood loss (chronic): Secondary | ICD-10-CM | POA: Diagnosis not present

## 2021-05-27 DIAGNOSIS — R5381 Other malaise: Secondary | ICD-10-CM | POA: Diagnosis not present

## 2021-05-27 DIAGNOSIS — Z20822 Contact with and (suspected) exposure to covid-19: Secondary | ICD-10-CM | POA: Diagnosis not present

## 2021-05-27 DIAGNOSIS — E785 Hyperlipidemia, unspecified: Secondary | ICD-10-CM | POA: Diagnosis not present

## 2021-05-27 DIAGNOSIS — Z888 Allergy status to other drugs, medicaments and biological substances status: Secondary | ICD-10-CM | POA: Diagnosis not present

## 2021-05-27 DIAGNOSIS — I517 Cardiomegaly: Secondary | ICD-10-CM | POA: Diagnosis not present

## 2021-05-27 DIAGNOSIS — R2689 Other abnormalities of gait and mobility: Secondary | ICD-10-CM | POA: Diagnosis not present

## 2021-05-27 DIAGNOSIS — J9622 Acute and chronic respiratory failure with hypercapnia: Secondary | ICD-10-CM | POA: Diagnosis not present

## 2021-05-27 DIAGNOSIS — G9341 Metabolic encephalopathy: Secondary | ICD-10-CM | POA: Diagnosis not present

## 2021-05-27 DIAGNOSIS — G4710 Hypersomnia, unspecified: Secondary | ICD-10-CM | POA: Diagnosis not present

## 2021-05-27 DIAGNOSIS — I10 Essential (primary) hypertension: Secondary | ICD-10-CM | POA: Diagnosis not present

## 2021-05-27 DIAGNOSIS — I25118 Atherosclerotic heart disease of native coronary artery with other forms of angina pectoris: Secondary | ICD-10-CM | POA: Diagnosis not present

## 2021-05-27 DIAGNOSIS — R911 Solitary pulmonary nodule: Secondary | ICD-10-CM | POA: Diagnosis not present

## 2021-05-27 DIAGNOSIS — Z7401 Bed confinement status: Secondary | ICD-10-CM | POA: Diagnosis not present

## 2021-05-27 DIAGNOSIS — Z87891 Personal history of nicotine dependence: Secondary | ICD-10-CM | POA: Diagnosis not present

## 2021-05-27 DIAGNOSIS — E8729 Other acidosis: Secondary | ICD-10-CM | POA: Diagnosis not present

## 2021-05-27 DIAGNOSIS — J9602 Acute respiratory failure with hypercapnia: Secondary | ICD-10-CM | POA: Diagnosis not present

## 2021-05-27 DIAGNOSIS — J189 Pneumonia, unspecified organism: Secondary | ICD-10-CM | POA: Diagnosis not present

## 2021-05-27 DIAGNOSIS — I714 Abdominal aortic aneurysm, without rupture, unspecified: Secondary | ICD-10-CM | POA: Diagnosis not present

## 2021-05-27 DIAGNOSIS — D649 Anemia, unspecified: Secondary | ICD-10-CM | POA: Diagnosis not present

## 2021-05-27 DIAGNOSIS — Z8249 Family history of ischemic heart disease and other diseases of the circulatory system: Secondary | ICD-10-CM | POA: Diagnosis not present

## 2021-05-27 DIAGNOSIS — G479 Sleep disorder, unspecified: Secondary | ICD-10-CM | POA: Diagnosis not present

## 2021-05-27 DIAGNOSIS — K219 Gastro-esophageal reflux disease without esophagitis: Secondary | ICD-10-CM | POA: Diagnosis not present

## 2021-05-27 DIAGNOSIS — I252 Old myocardial infarction: Secondary | ICD-10-CM | POA: Diagnosis not present

## 2021-05-27 DIAGNOSIS — J9601 Acute respiratory failure with hypoxia: Secondary | ICD-10-CM | POA: Diagnosis not present

## 2021-05-27 DIAGNOSIS — K761 Chronic passive congestion of liver: Secondary | ICD-10-CM | POA: Diagnosis not present

## 2021-05-27 DIAGNOSIS — E8770 Fluid overload, unspecified: Secondary | ICD-10-CM | POA: Diagnosis not present

## 2021-05-27 DIAGNOSIS — Z8551 Personal history of malignant neoplasm of bladder: Secondary | ICD-10-CM | POA: Diagnosis not present

## 2021-05-27 DIAGNOSIS — Z955 Presence of coronary angioplasty implant and graft: Secondary | ICD-10-CM | POA: Diagnosis not present

## 2021-05-27 DIAGNOSIS — I083 Combined rheumatic disorders of mitral, aortic and tricuspid valves: Secondary | ICD-10-CM | POA: Diagnosis not present

## 2021-05-27 DIAGNOSIS — E7220 Disorder of urea cycle metabolism, unspecified: Secondary | ICD-10-CM | POA: Diagnosis not present

## 2021-05-27 DIAGNOSIS — I2510 Atherosclerotic heart disease of native coronary artery without angina pectoris: Secondary | ICD-10-CM | POA: Diagnosis not present

## 2021-05-27 DIAGNOSIS — J9621 Acute and chronic respiratory failure with hypoxia: Secondary | ICD-10-CM | POA: Diagnosis not present

## 2021-05-27 DIAGNOSIS — M6281 Muscle weakness (generalized): Secondary | ICD-10-CM | POA: Diagnosis not present

## 2021-05-27 DIAGNOSIS — D509 Iron deficiency anemia, unspecified: Secondary | ICD-10-CM | POA: Diagnosis not present

## 2021-05-27 DIAGNOSIS — Z743 Need for continuous supervision: Secondary | ICD-10-CM | POA: Diagnosis not present

## 2021-05-27 DIAGNOSIS — I214 Non-ST elevation (NSTEMI) myocardial infarction: Secondary | ICD-10-CM | POA: Diagnosis not present

## 2021-05-27 DIAGNOSIS — G14 Postpolio syndrome: Secondary | ICD-10-CM | POA: Diagnosis not present

## 2021-05-27 DIAGNOSIS — R531 Weakness: Secondary | ICD-10-CM | POA: Diagnosis not present

## 2021-05-27 LAB — GLUCOSE, CAPILLARY
Glucose-Capillary: 146 mg/dL — ABNORMAL HIGH (ref 70–99)
Glucose-Capillary: 177 mg/dL — ABNORMAL HIGH (ref 70–99)

## 2021-05-27 NOTE — Progress Notes (Incomplete)
Patient ID: [name], male   DOB: 1941-02-13, 80 y.o.   MRN: [id number]    80 y.o. history of distant LAD stent 2009. CRFls HTN and HLD. Activity limited by leg weakness from polio. Stays busy doing remodeling work. Sees hematiology for chronically elevated WBC no signs of lymphoma  Had EGD and colonoscopy 05/13/18 for heme positive stool and iron deficiency anemia Noted external Hemorrhoids and one small polyp removed Also had an area of gastric angiodysplasia Rx with clip/cautery  Myovue normal no ischemia 07/02/19 Echo with mild / moderate AS 06/25/19   Rx for bladder cancer. CT showed 6 cm AAA  Seen by Dr Scot Dock with open repair 10/14/19   Admitted 05/19/21 with chest pain with GI overtones Cath done 05/20/21 With no lesions patient mid LAD stents  moderate AS mean gradient 20 mmhg ? Oversedation post cath and intubated for 24 hours D/c on 05/27/21 CT head negative CTA negative PE ? OSA and hypercapnic respiratory failure D/c on oxygen CXR with just atelectasis CT abdomen no acute findings intact repair of AAA  Recommended home sleep study and f/u GI for elevated LFTls improving on d/c from AST 871-> 117 and ALT 709-> 385 Dr Pincus Sanes GI seen in hospital ? Mild transient ischemic hepatopathy should improve  ***    ROS: Denies fever, malais, weight loss, blurry vision, decreased visual acuity, cough, sputum, SOB, hemoptysis, pleuritic pain, palpitaitons, heartburn, abdominal pain, melena, lower extremity edema, claudication, or rash.  All other systems reviewed and negative   General: There were no vitals taken for this visit.  Affect appropriate Healthy:  appears stated age 16: normal Neck supple with no adenopathy JVP normal no bruits no thyromegaly Lungs clear with no wheezing and good diaphragmatic motion Heart:  S1/S2 AS murmur, no rub, gallop or click PMI normal Abdomen: benighn, BS positve, no tenderness, no AAA no bruit.  No HSM or HJR Distal pulses intact with no  bruits Plus one bilateral edema Neuro non-focal Skin warm and dry Polio with atrophied right shoulder and arm  Post left TKR    No current facility-administered medications for this visit.   No current outpatient medications on file.   Facility-Administered Medications Ordered in Other Visits  Medication Dose Route Frequency Provider Last Rate Last Admin   0.9 %  sodium chloride infusion  250 mL Intravenous PRN Spero Geralds, MD   Paused at 05/22/21 1139   albuterol (PROVENTIL) (2.5 MG/3ML) 0.083% nebulizer solution 2.5 mg  2.5 mg Nebulization Q6H PRN Spero Geralds, MD       aspirin EC tablet 81 mg  81 mg Oral Daily Jonetta Osgood, MD   81 mg at 05/27/21 5035   Chlorhexidine Gluconate Cloth 2 % PADS 6 each  6 each Topical Daily Spero Geralds, MD   6 each at 05/27/21 0915   clopidogrel (PLAVIX) tablet 75 mg  75 mg Oral Daily Jonetta Osgood, MD   75 mg at 05/27/21 0911   docusate sodium (COLACE) capsule 100 mg  100 mg Oral BID Spero Geralds, MD   100 mg at 05/27/21 0911   heparin injection 5,000 Units  5,000 Units Subcutaneous Q8H Spero Geralds, MD   5,000 Units at 05/27/21 0542   insulin aspart (novoLOG) injection 0-9 Units  0-9 Units Subcutaneous TID WC Spero Geralds, MD   2 Units at 05/27/21 1222   MEDLINE mouth rinse  15 mL Mouth Rinse BID Spero Geralds, MD  15 mL at 05/27/21 0934   multivitamin with minerals tablet 1 tablet  1 tablet Oral Daily Jonetta Osgood, MD   1 tablet at 05/27/21 0910   nitroGLYCERIN (NITROSTAT) SL tablet 0.4 mg  0.4 mg Sublingual Q5 Min x 3 PRN Spero Geralds, MD       ondansetron [facility]) tablet 4 mg  4 mg Oral Q6H PRN Spero Geralds, MD       Or   ondansetron [facility]) injection 4 mg  4 mg Intravenous Q6H PRN Spero Geralds, MD       pantoprazole (PROTONIX) EC tablet 40 mg  40 mg Oral BID Spero Geralds, MD   40 mg at 05/27/21 0911   polyethylene glycol (MIRALAX / GLYCOLAX) packet 17 g  17 g Oral Daily Spero Geralds, MD   17 g  at 05/27/21 0911   sodium chloride flush (NS) 0.9 % injection 3 mL  3 mL Intravenous Q12H Spero Geralds, MD   3 mL at 05/27/21 0934   sodium chloride flush (NS) 0.9 % injection 3 mL  3 mL Intravenous PRN Spero Geralds, MD       traZODone (DESYREL) tablet 25 mg  25 mg Oral QHS PRN Spero Geralds, MD        Allergies  Tramadol  Electrocardiogram:    05/27/2021 SR rate 61 RBBB LAD no changes   Assessment and Plan  CAD: Distant LAD stent  Most recent myovue 07/02/19 normal EF 67% No angina Cath 05/20/21 patent LAD stent no obstructive lesions continue medical Rx    HTN: Well controlled.  Continue current medications and low sodium Dash type diet.    Chol:   LDL 75 most recent labs continue  lipitor 40 mg daily    Aortic Stenosis :  TTE reviewed 05/11/21 EF 65-70% stable mean gradient 21 mmHg and mean gradient cath 20 mmHg Moderate stenosis  DVI 0.45 and AVA 1.34 cm2   Edema:  Dependant -Low dose diuretic HCTZ 12.5 mg  PRN Low sodium diet .   Anemia:  See notes regarding Endo/Colon on 05/31/18  Continue iron and prilosec   AAA:  Post repair Dr Scot Dock 10/14/19 ABI's normal 07/07/20 f/u US per [facility]  CTA  05/20/21 intact repair   LFT;s:  ? Ischemic hepatopathy post cath with respiratory failure recheck LFTls improving on d/c   Pulmonary:  post intubation ? OSA f/u primary order sleep study Recheck CXR    LFTls CXR F/U in 6 months    [name]

## 2021-05-27 NOTE — Progress Notes (Signed)
Physical Therapy Treatment Patient Details Name: [name] MRN: [id number] DOB: 03/23/1941 Today's Date: 05/27/2021   History of Present Illness 80 yo male admitted 12/8 to [facility], was noted to have abd/chest pain, nausea, and then transaminitis and NSTEMI.  Went to [facility] where heart cath was done, became unresponsive afterward and was intubated.  Now on cannula of 3L with clearing of encephalopathy, new lung nodule, debility with underlying post polio syndrome.  PMHx:  HLD, falls, OSA,    PT Comments    Pt tolerates treatment well, ambulating for increased distances at this time. Pt does continue to demonstrate instability with one loss of balance during session. SpO2 continues to drop when on room air, however pt is asymptomatic. Pt will benefit from continued acute PT services to restore independence and reduce falls risk.   Recommendations for follow up therapy are one component of a multi-disciplinary discharge planning process, led by the attending physician.  Recommendations may be updated based on patient status, additional functional criteria and insurance authorization.  Follow Up Recommendations  Skilled nursing-short term rehab (<3 hours/day)     Assistance Recommended at Discharge Frequent or constant Supervision/Assistance  Equipment Recommendations   (TBD at rehab)    Recommendations for Other Services       Precautions / Restrictions Precautions Precautions: Fall Precaution Comments: pt reports no falls in past 3 months, conflicting reports from last PT note stating 8 falls in last 6 weeks Restrictions Weight Bearing Restrictions: No     Mobility  Bed Mobility                    Transfers Overall transfer level: Needs assistance Equipment used: Quad cane Transfers: Sit to/from Stand Sit to Stand: Supervision                Ambulation/Gait Ambulation/Gait assistance: Min guard Gait Distance (Feet): 150 Feet (150' x 2, 30 of 150' without use  of device) Assistive device: Hemi-walker Gait Pattern/deviations: Step-through pattern Gait velocity: reduced Gait velocity interpretation: <1.8 ft/sec, indicate of risk for recurrent falls   General Gait Details: pt with step-through gait, one loss of balance needing PT assist to correct, increased sway without use of cane which improves some with continued ambulation   Stairs             Wheelchair Mobility    Modified Rankin (Stroke Patients Only)       Balance Overall balance assessment: Needs assistance Sitting-balance support: No upper extremity supported;Feet supported Sitting balance-Leahy Scale: Good     Standing balance support: Single extremity supported;Reliant on assistive device for balance Standing balance-Leahy Scale: Poor Standing balance comment: benefits from UE support of device                            Cognition Arousal/Alertness: Awake/alert Behavior During Therapy: WFL for tasks assessed/performed Overall Cognitive Status: Within Functional Limits for tasks assessed                                          Exercises      General Comments General comments (skin integrity, edema, etc.): pt on 3L [place], desats to 88% on room air at rest. SpO2 reading 82% at end of ambulation while on room air, recovers quickly. Pt denies any SOB or DOE.      Pertinent  Vitals/Pain Pain Assessment: No/denies pain    Home Living                          Prior Function            PT Goals (current goals can now be found in the care plan section) Acute Rehab PT Goals Patient Stated Goal: to get home Progress towards PT goals: Progressing toward goals    Frequency    Min 3X/week      PT Plan Current plan remains appropriate    Co-evaluation              AM-PAC PT "6 Clicks" Mobility   Outcome Measure  Help needed turning from your back to your side while in a flat bed without using bedrails?: A  Little Help needed moving from lying on your back to sitting on the side of a flat bed without using bedrails?: A Little Help needed moving to and from a bed to a chair (including a wheelchair)?: A Little Help needed standing up from a chair using your arms (e.g., wheelchair or bedside chair)?: A Little Help needed to walk in hospital room?: A Little Help needed climbing 3-5 steps with a railing? : Total 6 Click Score: 16    End of Session Equipment Utilized During Treatment: Oxygen Activity Tolerance: Patient tolerated treatment well Patient left: in chair;with call bell/phone within reach Nurse Communication: Mobility status PT Visit Diagnosis: Unsteadiness on feet (R26.81);Muscle weakness (generalized) (M62.81);Difficulty in walking, not elsewhere classified (R26.2)     Time: 5462-7035 PT Time Calculation (min) (ACUTE ONLY): 20 min  Charges:  $Gait Training: 8-22 mins                     Zenaida Niece, PT, DPT Acute Rehabilitation Pager: [phone] Office [place] [facility] 05/27/2021, 9:56 AM

## 2021-05-27 NOTE — TOC Transition Note (Signed)
Transition of Care [facility]) - CM/SW Discharge Note   Patient Details  Name: [name] MRN: [id number] Date of Birth: August 24, 1940  Transition of [facility]) CM/SW Contact:  [name], LCSW Phone Number: 05/27/2021, 11:17 AM   Clinical Narrative:    Patient will DC to: [place] Anticipated DC date: 05/27/21 Family notified: Spouse at bedside Transport by: Corey Harold    Per MD patient ready for DC to [facility] RN to call report prior to discharge [phone] Room B1-1). RN, patient, patient's family, and facility notified of DC. Discharge Summary and FL2 sent to facility. DC packet on chart. Ambulance transport requested for patient.   CSW will sign off for now as social work intervention is no longer needed. Please consult [place] again if new needs arise.     Final next level of care: Skilled Nursing Facility Barriers to Discharge: Barriers Resolved   Patient Goals and CMS Choice Patient states their goals for this hospitalization and ongoing recovery are:: Rehab CMS Medicare.gov Compare Post Acute Care list provided to:: Patient Choice offered to / list presented to : Patient, Spouse  Discharge Placement   Existing PASRR number confirmed : 05/27/21          Patient chooses bed at: [facility] Patient to be transferred to facility by: [place] Name of family member notified: Spouse and daughter at bedside Patient and family notified of of transfer: 05/27/21  Discharge Plan and Services In-house Referral: Clinical Social Work   Post Acute Care Choice: [place]                               Social Determinants of Health (SDOH) Interventions     Readmission Risk Interventions No flowsheet data found.

## 2021-05-27 NOTE — Discharge Summary (Signed)
PATIENT DETAILS Name: [name] Age: 80 y.o. Sex: male Date of Birth: 07-Dec-1940 MRN: [id number]. Admitting Physician: Murlean Iba, MD TDD:[id number], [name], MD  Admit Date: 05/19/2021 Discharge date: 05/27/2021  Recommendations for Outpatient Follow-up:  Follow up with PCP in 1-2 weeks Please obtain CMP/CBC in one week Incidental finding-4 mm right upper lobe lung nodule-please repeat CT in 12 months.   Please ensure outpatient follow-up with gastroenterology  Please ensure outpatient follow-up with cardiology  Please ensure outpatient follow-up with pulmonology for sleep study.  Pulmonology to evaluate at follow-up whether patient still requires home O2.  Admitted From:  Home  Disposition: SNF   Home Health: No  Equipment/Devices:  oxygen 2L  Discharge Condition: Stable  CODE STATUS: FULL CODE  Diet recommendation:  Diet Order             Diet - low sodium heart healthy           Diet Heart Room service appropriate? Yes; Fluid consistency: Thin  Diet effective now                    Brief Summary: Patient is a 80 y.o. male with history of CAD s/p PCI to LAD in 2009, AAA s/p repair in 2021, HTN, HLD, GERD, bladder cancer-s/p TURBT, history of polio with right-sided atrophy-presented to the ED at [facility] on 12/8 with nausea, vomiting, abdominal/chest pain-he was found to have transaminitis, non-STEMI-subsequently transferred to [facility] where [place] was performed on 12/9 which showed stable single-vessel nonobstructive CAD-on return from the Cath Lab-he was found to be unresponsive-intubated and transferred to the ICU.  He underwent extensive evaluation while in the ICU-he was not thought to have acute CVA, CTA chest was negative for PE-he was subsequently extubated on 12/10 and transferred to the [facility] hospitalist service on 12/12.  See below for further details.  Brief Hospital Course: Acute metabolic encephalopathy secondary to presumed hypercapnic  respiratory failure requiring intubation: Suspicion is that he became unresponsive post cardiac cath due to underlying OSA that caused transient hypercapnia/hypoxemia.  CTA chest negative for PE, CT head negative for CVA, CTA head/neck negative for LVO.  Recommendations from PCCM are to pursue outpatient sleep study-Per physical therapy note-he desaturates with ambulation-therefore will likely go home with 24/7 home O2 treatment.  His encephalopathy has significantly improved-he is now awake and alert.     Acute hypoxemic/hypercapnic respiratory failure: See above-respiratory failure has improved-extubated on 12/10-Per physical therapy note on 12/12-desaturate significantly with ambulation-we will plan on home O2 on discharge.  PCCM will arrange for outpatient polysomnography.   Non-STEMI: Thought to be due to demand ischemia-LHC without any significant stenosis.  Per last cardiology note-no need to change cardiac medications-suspect can be resumed on dual antiplatelet agents.   HLD: Continue to hold statins will transaminitis improves further.   Transaminitis: Suspicion that this is either from non-STEMI or ischemic hepatopathy.  LFTs decreasing-GI recommending repeating LFTs in 2 weeks at PCPs office.  Acute hepatitis serology was negative.  Influenza/COVID PCR was also negative.   HCAP: Complete 5 days of empiric antimicrobial therapy with cefepime.  Sputum/blood cultures negative so far.   Normocytic anemia: No evidence of blood loss-no retroperitoneal hemorrhage seen on CT abdomen.  Suspect worsening anemia due to acute illness-repeat CBC periodically.   4 mm right upper lobe lung nodule: Repeat CT in 12 months.   Debility/deconditioning: Recommendations are for SNF-has postpolio syndrome and chronic right-sided weakness.   Nutrition Status: Nutrition Problem: Inadequate  oral intake Etiology: acute illness Signs/Symptoms: NPO status Interventions: MVI   Obesity Estimated body mass index  is 32.38 kg/m as calculated from the following:   Height as of this encounter: 5\' 6"  (1.676 m).   Weight as of this encounter: 91 kg.     Procedures 12/9>> LHC  Discharge Diagnoses:  Principal Problem:   NSTEMI (non-ST elevated myocardial infarction) ([place]) Active Problems:   Elevated lipids   CORONARY ARTERY ANEURYSM   CLAUDICATION   HTN (hypertension)   Murmur   Polio   Leukocytosis   Iron deficiency anemia   Coronary artery disease   Iron deficiency anemia due to chronic blood loss   Bladder mass   AAA (abdominal aortic aneurysm)   S/P arterial stent   Abdominal aortic aneurysm (AAA)   Abdominal aortic aneurysm (AAA) greater than 5.5 cm in diameter in male   Acute respiratory failure with hypoxia (HCC)   GERD (gastroesophageal reflux disease)   HOH (hard of hearing)   Elevated liver enzymes   Acute encephalopathy   Discharge Instructions:  Activity:  As tolerated with Full fall precautions use walker/cane & assistance as needed  Discharge Instructions     Diet - low sodium heart healthy   Complete by: As directed    Discharge instructions   Complete by: As directed    Follow with Primary MD  Celene Squibb, MD in 1-2 weeks  Please get a complete blood count and chemistry panel checked by your Primary MD at your next visit, and again as instructed by your Primary MD.  Get Medicines reviewed and adjusted: Please take all your medications with you for your next visit with your Primary MD  Laboratory/radiological data: Please request your Primary MD to go over all hospital tests and procedure/radiological results at the follow up, please ask your Primary MD to get all Hospital records sent to his/her office.  In some cases, they will be blood work, cultures and biopsy results pending at the time of your discharge. Please request that your primary care M.D. follows up on these results.  Also Note the following: If you experience worsening of your admission  symptoms, develop shortness of breath, life threatening emergency, suicidal or homicidal thoughts you must seek medical attention immediately by calling 911 or calling your MD immediately  if symptoms less severe.  You must read complete instructions/literature along with all the possible adverse reactions/side effects for all the Medicines you take and that have been prescribed to you. Take any new Medicines after you have completely understood and accpet all the possible adverse reactions/side effects.   Do not drive when taking Pain medications or sleeping medications (Benzodaizepines)  Do not take more than prescribed Pain, Sleep and Anxiety Medications. It is not advisable to combine anxiety,sleep and pain medications without talking with your primary care practitioner  Special Instructions: If you have smoked or chewed Tobacco  in the last 2 yrs please stop smoking, stop any regular Alcohol  and or any Recreational drug use.  Wear Seat belts while driving.  Please note: You were cared for by a hospitalist during your hospital stay. Once you are discharged, your primary care physician will handle any further medical issues. Please note that NO REFILLS for any discharge medications will be authorized once you are discharged, as it is imperative that you return to your primary care physician (or establish a relationship with a primary care physician if you do not have one) for your post hospital discharge needs  so that they can reassess your need for medications and monitor your lab values.   Increase activity slowly   Complete by: As directed       Allergies as of 05/27/2021       Reactions   Tramadol Other (See Comments)   Excessive sleepiness/difficulty waking patient        Medication List     TAKE these medications    aspirin EC 81 MG tablet Take 81 mg by mouth daily.   atorvastatin 40 MG tablet Commonly known as: LIPITOR Take 1 tablet by mouth once daily   clopidogrel  75 MG tablet Commonly known as: PLAVIX Take 1 tablet by mouth once daily   ELDERBERRY PO Take 1 tablet by mouth daily.   ferrous sulfate 325 (65 FE) MG tablet Take 1 tablet (325 mg total) by mouth 2 (two) times daily with a meal.   nitroGLYCERIN 0.4 MG SL tablet Commonly known as: NITROSTAT Place 1 tablet (0.4 mg total) under the tongue every 5 (five) minutes x 3 doses as needed for chest pain.   ondansetron 4 MG tablet Commonly known as: ZOFRAN Take 8 mg by mouth 2 (two) times daily.   pantoprazole 40 MG tablet Commonly known as: PROTONIX Take 1 tablet (40 mg total) by mouth daily. Take 30 min before meals   PROBIOTIC PO Take 1 tablet by mouth daily.               Durable Medical Equipment  (From admission, onward)           Start     Ordered   05/24/21 0730  For home use only DME oxygen  Once       Question Answer Comment  Length of Need 6 Months   Mode or (Route) Nasal cannula   Liters per Minute 2   Frequency Continuous (stationary and portable oxygen unit needed)   Oxygen conserving device Yes   Oxygen delivery system Gas      05/24/21 0729            Contact information for follow-up providers     Celene Squibb, MD. Schedule an appointment as soon as possible for a visit in 1 week(s).   Specialty: Internal Medicine Contact information: [facility] [place] [address] [phone]         Josue Hector, MD Follow up in 2 day(s).   Specialty: Cardiology Contact information: [address] [place] [address] [phone]         [facility] Follow up.   Specialty: Pulmonology Why: Office will call with date/time Contact information: [place] [id number] [phone]             Contact information for after-discharge care     [facility] .   Service: Skilled Nursing Contact  information: [place] [address] [phone]                    Allergies  Allergen Reactions   Tramadol Other (See Comments)    Excessive sleepiness/difficulty waking patient    Consultations: GI, cardiology, neurology, PCCM    Other Procedures/Studies: CT ABDOMEN PELVIS WO CONTRAST  Result Date: 05/20/2021 CLINICAL DATA:  Status post cardiac catheterization this morning, evaluate for retroperitoneal bleed EXAM: CT ABDOMEN AND PELVIS WITHOUT CONTRAST TECHNIQUE: Multidetector CT imaging of the abdomen  and pelvis was performed following the standard protocol without IV contrast. COMPARISON:  05/19/2021 FINDINGS: Lower chest: Aortic valve calcifications. Coronary artery calcifications. Small bilateral pleural effusions, increased compared to prior examination. Hepatobiliary: No focal liver abnormality is seen. Status post cholecystectomy. No biliary dilatation. Pancreas: Unremarkable. No pancreatic ductal dilatation or surrounding inflammatory changes. Spleen: Normal in size without significant abnormality. Adrenals/Urinary Tract: Adrenal glands are unremarkable. Excreted contrast in the collecting systems and urinary bladder, in keeping with same-day coronary catheterization. Kidneys are otherwise normal, without renal calculi, solid lesion, or hydronephrosis. Bladder is unremarkable. Stomach/Bowel: Stomach is within normal limits. Appendix appears normal. No evidence of bowel wall thickening, distention, or inflammatory changes. Vascular/Lymphatic: Aortic atherosclerosis. Status post graft repair of the abdominal aorta. No enlarged abdominal or pelvic lymph nodes. Reproductive: No mass or other significant abnormality. Other: No abdominal wall hernia or abnormality. Subcutaneous soft tissue stranding and pressure dressing overlying the right femoral vessels (series 3, image 89). No abdominopelvic ascites. Musculoskeletal: No acute or significant osseous  findings. Chronic pars defects of L5. IMPRESSION: 1. No evidence of retroperitoneal hematoma or other acute noncontrast CT findings in the abdomen or pelvis. 2. Subcutaneous soft tissue stranding and pressure dressing overlying the right femoral vessels, in keeping with catheterization. 3. Small bilateral pleural effusions, increased compared to prior examination. 4. Status post graft repair of the abdominal aorta. Aortic Atherosclerosis (ICD10-I70.0). Electronically Signed   By: Delanna Ahmadi M.D.   On: 05/20/2021 11:34   DG Chest 1 View  Result Date: 05/19/2021 CLINICAL DATA:  Shortness of breath. EXAM: CHEST  1 VIEW COMPARISON:  Chest x-ray from same day at 1222 hours. FINDINGS: Stable cardiomegaly. Unchanged bibasilar atelectasis/scarring. Linear density overlying the peripheral right lung corresponds to a prominent lung marking on CT from same day. No focal consolidation, pleural effusion, or pneumothorax. No acute osseous abnormality. IMPRESSION: 1. Linear density overlying the peripheral right lung corresponds to a prominent lung marking. No pneumothorax. Electronically Signed   By: Titus Dubin M.D.   On: 05/19/2021 14:41   DG Abd 1 View  Result Date: 05/20/2021 CLINICAL DATA:  OG tube placement. EXAM: ABDOMEN - 1 VIEW COMPARISON:  10/14/2019 FINDINGS: 1153 hours. OG tube tip is at the esophagogastric junction with proximal side port in the distal esophagus. Diffuse gaseous distention of small bowel and colon evident. IMPRESSION: OG tube tip is at the esophagogastric junction with proximal side port in the distal esophagus. Tube could be advanced 9-10 cm to place the proximal side port below the GE junction. Electronically Signed   By: Misty Stanley M.D.   On: 05/20/2021 12:04   CT Angio Chest Pulmonary Embolism (PE) W or WO Contrast  Result Date: 05/21/2021 CLINICAL DATA:  High probability for pulmonary embolism.  The Pneumonia EXAM: CT ANGIOGRAPHY CHEST WITH CONTRAST TECHNIQUE:  Multidetector CT imaging of the chest was performed using the standard protocol during bolus administration of intravenous contrast. Multiplanar CT image reconstructions and MIPs were obtained to evaluate the vascular anatomy. CONTRAST:  133mL OMNIPAQUE IOHEXOL 350 MG/ML SOLN COMPARISON:  None. FINDINGS: Cardiovascular: The heart is mildly enlarged. Coronary artery calcifications are noted. No pulmonary artery emboli. Mediastinum/Nodes: No enlarged mediastinal, hilar, or axillary lymph nodes. Thyroid gland, trachea, and esophagus demonstrate no significant findings. ET tube in appropriate position. NG tube terminates in the proximal stomach. Lungs/Pleura: Trace bilateral pleural effusions with mild adjacent atelectasis. No focal airspace opacity to indicate pneumonia. 4 mm nodule seen in the right upper lobe (46/6). Upper Abdomen: No acute  abnormality. Musculoskeletal: No chest wall abnormality. No acute or significant osseous findings. Review of the MIP images confirms the above findings. IMPRESSION: 1. No pulmonary artery embolism. 2. Trace bilateral pleural effusions with mild adjacent atelectasis. 3. 4 mm right upper lobe noncalcified pulmonary nodule (46/6). No follow-up needed if patient is low-risk. Non-contrast chest CT can be considered in 12 months if patient is high-risk. This recommendation follows the consensus statement: Guidelines for Management of Incidental Pulmonary Nodules Detected on CT Images: From the Fleischner Society 2017; Radiology 2017; 284:228-243. Electronically Signed   By: Miachel Roux M.D.   On: 05/21/2021 09:56   CT ABDOMEN PELVIS W CONTRAST  Result Date: 05/19/2021 CLINICAL DATA:  Acute generalized abdominal pain. EXAM: CT ABDOMEN AND PELVIS WITH CONTRAST TECHNIQUE: Multidetector CT imaging of the abdomen and pelvis was performed using the standard protocol following bolus administration of intravenous contrast. CONTRAST:  139mL OMNIPAQUE IOHEXOL 300 MG/ML  SOLN COMPARISON:   September 23, 2019. FINDINGS: Lower chest: Mild bibasilar subsegmental atelectasis is noted. Hepatobiliary: No focal liver abnormality is seen. Status post cholecystectomy. No biliary dilatation. Pancreas: Unremarkable. No pancreatic ductal dilatation or surrounding inflammatory changes. Spleen: Normal in size without focal abnormality. Adrenals/Urinary Tract: Adrenal glands appear normal. Right renal cyst is noted. No hydronephrosis or renal obstruction is noted. No renal or ureteral calculi are noted. Urinary bladder is unremarkable. Stomach/Bowel: Stomach is within normal limits. Appendix appears normal. No evidence of bowel wall thickening, distention, or inflammatory changes. Vascular/Lymphatic: Status post surgical repair of abdominal aorta. No adenopathy is noted. Reproductive: Prostate is unremarkable. Other: No abdominal wall hernia or abnormality. No abdominopelvic ascites. Musculoskeletal: Minimal grade 1 anterolisthesis is noted secondary to bilateral L5 spondylolysis. IMPRESSION: No acute abnormality seen in the abdomen or pelvis. Aortic Atherosclerosis (ICD10-I70.0). Electronically Signed   By: Marijo Conception M.D.   On: 05/19/2021 13:33   CARDIAC CATHETERIZATION  Result Date: 05/20/2021   Ost LAD to Prox LAD lesion is 40% stenosed.  Prox LAD to Mid LAD overlapping stent segment is mostly widely patent with 5% in-stent stenosis   Mid LAD lesion is 50% stenosed.   Ramus lesion is 40% stenosed.   Mild to moderate diffuse disease elsewhere.  Codominant system.   LV end diastolic pressure is mildly elevated.   There is moderate aortic valve stenosis. SUMMARY Stable single-vessel disease with widely patent LAD stents and moderate mid LAD 50% stenosis. No obvious culprit lesion to explain non-STEMI. Mildly elevated LVEDP with normal EF by echo. Moderate aortic stenosis by echo with mean gradient of roughly 20 mmHg by cath. RECOMMENDATIONS Suspect demand ischemia with ongoing illness Continue medical  management. Okay to discontinue heparin Glenetta Hew, MD  DG Chest [facility] 1 View  Result Date: 05/21/2021 CLINICAL DATA:  Pneumonia. EXAM: PORTABLE CHEST 1 VIEW COMPARISON:  May 20, 2021. FINDINGS: Stable cardiomediastinal silhouette. Endotracheal and nasogastric tubes are unchanged. Stable bibasilar subsegmental atelectasis is noted. Bony thorax is unremarkable. IMPRESSION: Stable support apparatus. Stable bibasilar subsegmental atelectasis. Electronically Signed   By: Marijo Conception M.D.   On: 05/21/2021 09:28   DG CHEST PORT 1 VIEW  Result Date: 05/20/2021 CLINICAL DATA:  Endotracheal tube placement. EXAM: PORTABLE CHEST 1 VIEW COMPARISON:  Same day. FINDINGS: Stable cardiomegaly. Endotracheal and nasogastric tubes are unchanged in position. Stable bilateral lung opacities are noted concerning for atelectasis or possibly infiltrates. Bony thorax is unremarkable. IMPRESSION: Stable support apparatus.  Stable bibasilar opacities. Electronically Signed   By: Bobbe Medico.D.  On: 05/20/2021 12:05   DG CHEST PORT 1 VIEW  Result Date: 05/20/2021 CLINICAL DATA:  80 year old male with history of non ST elevation myocardial infarction. EXAM: PORTABLE CHEST 1 VIEW COMPARISON:  Chest x-ray 05/19/2021. FINDINGS: An endotracheal tube is in place with tip 3.2 cm above the carina. Transcutaneous defibrillator pad projecting over the mid thorax. Lung volumes are low. Bibasilar opacities (left greater than right) may reflect areas of atelectasis and/or consolidation. Small left pleural effusion. No definite right pleural effusion. No pneumothorax. Nodular density projecting over the right upper lobe, not readily apparent on the recent prior examination, potentially artifact, measuring 1.4 cm in diameter. Mild diffuse interstitial prominence and cephalization of the pulmonary vasculature, suggesting a background of mild interstitial pulmonary edema. Heart size is mildly enlarged. The patient is rotated to  the left on today's exam, resulting in distortion of the mediastinal contours and reduced diagnostic sensitivity and specificity for mediastinal pathology. Atherosclerotic calcifications in the thoracic aorta. IMPRESSION: 1. Support apparatus, as above. 2. The appearance the chest suggests mild congestive heart failure, as above. 3. Bibasilar opacities which may reflect areas of atelectasis and/or consolidation. 4. New nodular density projecting over the region of the right upper lobe, potentially artifactual. Close attention on follow-up studies is recommended to ensure resolution of this finding. 5. Aortic atherosclerosis. Electronically Signed   By: Vinnie Langton M.D.   On: 05/20/2021 10:58   DG Chest Portable 1 View  Result Date: 05/19/2021 CLINICAL DATA:  Shortness of breath EXAM: PORTABLE CHEST 1 VIEW COMPARISON:  Previous studies including the examination of 10/15/2019 FINDINGS: Transverse diameter of heart is increased. There are no signs of pulmonary edema. There are linear densities in medial left lower lung fields. Left lateral CP angle is indistinct. There is no definite pneumothorax. Faint linear density seen in the lateral aspect of right lower lung fields may suggest pleural thickening. Less likely possibility would be loculated right pneumothorax. IMPRESSION: Cardiomegaly. Increased markings in the medial lower lung fields may suggest scarring or subsegmental atelectasis. There are no new focal infiltrates. There is faint linear radiopacity in the lateral aspect of right lower lung fields which may be due to pleural thickening or suggest small loculated right pneumothorax. There is no demonstrable right apical pneumothorax. Repeat PA views of chest in the inspiration and expiration may be considered. Electronically Signed   By: Elmer Picker M.D.   On: 05/19/2021 12:42   ECHOCARDIOGRAM COMPLETE  Result Date: 05/11/2021    ECHOCARDIOGRAM REPORT   Patient Name:   VIDYUTH BELSITO Date  of Exam: 05/11/2021 Medical Rec #:  [id number]       Height:       66.0 in Accession #:    [id number]      Weight:       197.0 lb Date of Birth:  09-02-1940      BSA:          1.987 m Patient Age:    56 years        BP:           159/80 mmHg Patient Gender: M               HR:           90 bpm. Exam Location:  [facility] Procedure: 2D Echo, Cardiac Doppler and Color Doppler Indications:    I35.0 (ICD-10-CM) - Nonrheumatic aortic valve stenosis  History:        Patient has prior history of Echocardiogram examinations,  most                 recent 05/14/2020. CAD, Aortic Valve Disease,                 Signs/Symptoms:Murmur; Risk Factors:Hypertension and                 Dyslipidemia. GERD.  Sonographer:    Alvino Chapel RCS Referring Phys: [place]  1. Left ventricular ejection fraction, by estimation, is 65 to 70%. The left ventricle has normal function. The left ventricle has no regional wall motion abnormalities. Left ventricular diastolic parameters are consistent with Grade I diastolic dysfunction (impaired relaxation). Elevated left ventricular end-diastolic pressure.  2. Right ventricular systolic function is normal. The right ventricular size is mildly enlarged. There is normal pulmonary artery systolic pressure. The estimated right ventricular systolic pressure is 29.5 mmHg.  3. Left atrial size was mildly dilated.  4. The mitral valve is abnormal. Trivial mitral valve regurgitation. Moderate mitral annular calcification.  5. The aortic valve is tricuspid. There is severe calcifcation of the aortic valve. Aortic valve regurgitation is trivial. Moderate aortic valve stenosis. Aortic valve mean gradient measures 21.0 mmHg. Dimentionless index 21 mmHg.  6. The inferior vena cava is normal in size with greater than 50% respiratory variability, suggesting right atrial pressure of 3 mmHg. Comparison(s): Prior images reviewed side by side. Aortic stenosis is in moderate range with mean gradient  21 mmHg (previoiusly 18 mmHg). FINDINGS  Left Ventricle: Left ventricular ejection fraction, by estimation, is 65 to 70%. The left ventricle has normal function. The left ventricle has no regional wall motion abnormalities. The left ventricular internal cavity size was normal in size. There is  borderline left ventricular hypertrophy. Left ventricular diastolic parameters are consistent with Grade I diastolic dysfunction (impaired relaxation). Elevated left ventricular end-diastolic pressure. Right Ventricle: The right ventricular size is mildly enlarged. No increase in right ventricular wall thickness. Right ventricular systolic function is normal. There is normal pulmonary artery systolic pressure. The tricuspid regurgitant velocity is 2.46  m/s, and with an assumed right atrial pressure of 3 mmHg, the estimated right ventricular systolic pressure is 18.8 mmHg. Left Atrium: Left atrial size was mildly dilated. Right Atrium: Right atrial size was normal in size. Pericardium: There is no evidence of pericardial effusion. Mitral Valve: The mitral valve is abnormal. Moderate mitral annular calcification. Trivial mitral valve regurgitation. Tricuspid Valve: The tricuspid valve is grossly normal. Tricuspid valve regurgitation is trivial. Aortic Valve: The aortic valve is tricuspid. There is severe calcifcation of the aortic valve. There is mild to moderate aortic valve annular calcification. Aortic valve regurgitation is trivial. Moderate aortic stenosis is present. Aortic valve mean gradient measures 21.0 mmHg. Aortic valve peak gradient measures 39.4 mmHg. Aortic valve area, by VTI measures 1.41 cm. Pulmonic Valve: The pulmonic valve was grossly normal. Pulmonic valve regurgitation is trivial. Aorta: The aortic root is normal in size and structure. Venous: The inferior vena cava is normal in size with greater than 50% respiratory variability, suggesting right atrial pressure of 3 mmHg. IAS/Shunts: No atrial level  shunt detected by color flow Doppler.  LEFT VENTRICLE PLAX 2D LVIDd:         5.20 cm   Diastology LVIDs:         2.80 cm   LV e' medial:    5.22 cm/s LV PW:         1.00 cm   LV E/e' medial:  21.6 LV  IVS:        1.00 cm   LV e' lateral:   6.96 cm/s LVOT diam:     2.00 cm   LV E/e' lateral: 16.2 LV SV:         84 LV SV Index:   42 LVOT Area:     3.14 cm  RIGHT VENTRICLE RV S prime:     17.40 cm/s TAPSE (M-mode): 2.2 cm LEFT ATRIUM             Index LA diam:        5.30 cm 2.67 cm/m LA Vol (A2C):   51.3 ml 25.82 ml/m LA Vol (A4C):   77.0 ml 38.75 ml/m LA Biplane Vol: 67.8 ml 34.12 ml/m  AORTIC VALVE AV Area (Vmax):    1.26 cm AV Area (Vmean):   1.34 cm AV Area (VTI):     1.41 cm AV Vmax:           314.00 cm/s AV Vmean:          207.000 cm/s AV VTI:            0.595 m AV Peak Grad:      39.4 mmHg AV Mean Grad:      21.0 mmHg LVOT Vmax:         126.00 cm/s LVOT Vmean:        88.500 cm/s LVOT VTI:          0.267 m LVOT/AV VTI ratio: 0.45  AORTA Ao Root diam: 3.50 cm MITRAL VALVE                TRICUSPID VALVE MV Area (PHT): 2.37 cm     TR Peak grad:   24.2 mmHg MV Decel Time: 320 msec     TR Vmax:        246.00 cm/s MV E velocity: 113.00 cm/s MV A velocity: 154.00 cm/s  SHUNTS MV E/A ratio:  0.73         Systemic VTI:  0.27 m                             Systemic Diam: 2.00 cm Rozann Lesches MD Electronically signed by Rozann Lesches MD Signature Date/Time: 05/11/2021/1:05:20 PM    Final    CT HEAD CODE STROKE WO CONTRAST  Result Date: 05/20/2021 CLINICAL DATA:  Code stroke. Neuro deficit, acute, stroke suspected. EXAM: CT HEAD WITHOUT CONTRAST TECHNIQUE: Contiguous axial images were obtained from the base of the skull through the vertex without intravenous contrast. COMPARISON:  Head CT 06/10/2007. FINDINGS: Brain: Mild generalized cerebral atrophy, not unexpected for age. There is no acute intracranial hemorrhage. No demarcated cortical infarct. No extra-axial fluid collection. No evidence of an  intracranial mass. No midline shift. Vascular: No hyperdense vessel.  Atherosclerotic calcifications. Skull: Normal. Negative for fracture or focal lesion. Sinuses/Orbits: Visualized orbits show no acute finding. Mild mucosal thickening within the bilateral ethmoid and right sphenoid sinuses. ASPECTS ([address] Stroke Program Early CT Score) - Ganglionic level infarction (caudate, lentiform nuclei, internal capsule, insula, M1-M3 cortex): 7 - Supraganglionic infarction (M4-M6 cortex): 3 Total score (0-10 with 10 being normal): 10 These results were communicated to Dr. Rory Percy At 11:09 amon 12/9/2022by text page via the [facility] messaging system. IMPRESSION: No evidence of acute intracranial abnormality. ASPECTS is 10. Mild generalized cerebral atrophy. Mild bilateral ethmoid and right maxillary sinus mucosal thickening. Electronically Signed   By: Kellie Simmering D.O.  On: 05/20/2021 11:11   VAS [place] LOWER EXTREMITY VENOUS (DVT)  Result Date: 05/22/2021  Lower Venous DVT Study Patient Name:  BUFORD BREMER  Date of Exam:   05/22/2021 Medical Rec #: [id number]        Accession #:    [id number] Date of Birth: 02/20/1941       Patient Gender: M Patient Age:   81 years Exam Location:  [facility] Procedure:      VAS [place] LOWER EXTREMITY VENOUS (DVT) Referring Phys: Noemi Chapel --------------------------------------------------------------------------------  Indications: Edema.  Comparison Study: No prior study Performing Technologist: Sharion Dove RVS  Examination Guidelines: A complete evaluation includes B-mode imaging, spectral Doppler, color Doppler, and power Doppler as needed of all accessible portions of each vessel. Bilateral testing is considered an integral part of a complete examination. Limited examinations for reoccurring indications may be performed as noted. The reflux portion of the exam is performed with the patient in reverse Trendelenburg.   +---------+---------------+---------+-----------+----------+--------------+  RIGHT     Compressibility Phasicity Spontaneity Properties Thrombus Aging  +---------+---------------+---------+-----------+----------+--------------+  CFV       Full            Yes       Yes                                    +---------+---------------+---------+-----------+----------+--------------+  SFJ       Full                                                             +---------+---------------+---------+-----------+----------+--------------+  FV Prox   Full                                                             +---------+---------------+---------+-----------+----------+--------------+  FV Mid    Full                                                             +---------+---------------+---------+-----------+----------+--------------+  FV Distal Full                                                             +---------+---------------+---------+-----------+----------+--------------+  PFV       Full                                                             +---------+---------------+---------+-----------+----------+--------------+  POP       Full  Yes       Yes                                    +---------+---------------+---------+-----------+----------+--------------+  PTV       Full                                                             +---------+---------------+---------+-----------+----------+--------------+  PERO      Full                                                             +---------+---------------+---------+-----------+----------+--------------+   +---------+---------------+---------+-----------+----------+--------------+  LEFT      Compressibility Phasicity Spontaneity Properties Thrombus Aging  +---------+---------------+---------+-----------+----------+--------------+  CFV       Full            Yes       Yes                                     +---------+---------------+---------+-----------+----------+--------------+  SFJ       Full                                                             +---------+---------------+---------+-----------+----------+--------------+  FV Prox   Full                                                             +---------+---------------+---------+-----------+----------+--------------+  FV Mid    Full                                                             +---------+---------------+---------+-----------+----------+--------------+  FV Distal Full                                                             +---------+---------------+---------+-----------+----------+--------------+  PFV       Full                                                             +---------+---------------+---------+-----------+----------+--------------+  POP       Full            Yes       Yes                                    +---------+---------------+---------+-----------+----------+--------------+  PTV       Full                                                             +---------+---------------+---------+-----------+----------+--------------+  PERO      Full                                                             +---------+---------------+---------+-----------+----------+--------------+     Summary: BILATERAL: - No evidence of deep vein thrombosis seen in the lower extremities, bilaterally. -No evidence of popliteal cyst, bilaterally.   *See table(s) above for measurements and observations. Electronically signed by Harold Barban MD on 05/22/2021 at 7:39:58 PM.    Final    CT ANGIO HEAD NECK W WO CM W PERF (CODE STROKE)  Result Date: 05/20/2021 CLINICAL DATA:  Stroke suspected EXAM: CT ANGIOGRAPHY HEAD AND NECK TECHNIQUE: Multidetector CT imaging of the head and neck was performed using the standard protocol during bolus administration of intravenous contrast. Multiplanar CT image reconstructions and MIPs were obtained to  evaluate the vascular anatomy. Carotid stenosis measurements (when applicable) are obtained utilizing NASCET criteria, using the distal internal carotid diameter as the denominator. CONTRAST:  1mL OMNIPAQUE IOHEXOL 350 MG/ML SOLN COMPARISON:  No prior CTA, correlation is made with same day CT head. FINDINGS: CT HEAD FINDINGS For noncontrast findings, please see same day CT head. CTA NECK FINDINGS Aortic arch: Standard branching. Imaged portion shows no evidence of aneurysm or dissection. No significant stenosis of the major arch vessel origins. Aortic atherosclerosis. The main pulmonary artery measures up to 3.4 cm, above the upper limit of normal, as can be seen in pulmonary hypertension. Right carotid system: No evidence of dissection, stenosis (50% or greater) or occlusion. Left carotid system: Approximately 50% stenosis in the proximal left internal carotid artery, secondary to calcified plaque. No occlusion or dissection. Vertebral arteries: Right dominant. No evidence of dissection, stenosis (50% or greater) or occlusion. Scattered calcifications in the V1 and V2 segments Skeleton: No acute osseous abnormality. Degenerative changes in the cervical spine. Other neck: The patient is intubated.  Otherwise negative. Upper chest: Atelectasis.  Emphysema. Review of the MIP images confirms the above findings CTA HEAD FINDINGS Anterior circulation: Both internal carotid arteries are patent to the termini, with mild calcifications but without significant stenosis. A1 segments patent. Normal anterior communicating artery. Anterior cerebral arteries are patent to their distal aspects. No M1 stenosis or occlusion. Normal MCA bifurcations. Distal MCA branches perfused and symmetric. Posterior circulation: Vertebral arteries patent to the vertebrobasilar junction without stenosis. Posterior inferior cerebral arteries patent bilaterally. Basilar patent to its distal aspect. Superior cerebellar arteries patent bilaterally.  PCAs perfused to their distal aspects without stenosis. The bilateral posterior communicating arteries  are not visualized. Venous sinuses: As permitted by contrast timing, patent. Anatomic variants: None significant. Review of the MIP images confirms the above findings IMPRESSION: 1. No intracranial large vessel occlusion or significant stenosis. 2. Approximately 50% stenosis in the proximal left ICA. No other hemodynamically significant stenosis in the neck. 3. Enlargement of the main pulmonary artery, as can be seen in the setting of pulmonary hypertension. 4. Aortic Atherosclerosis (ICD10-I70.0) and Emphysema (ICD10-J43.9). Electronically Signed   By: Merilyn Baba M.D.   On: 05/20/2021 11:36     TODAY-DAY OF DISCHARGE:  Subjective:   Tricia Sallie today has no headache,no chest abdominal pain,no new weakness tingling or numbness, feels much better wants to go home today.  Objective:   Blood pressure 100/78, pulse 76, temperature 98.7 F (37.1 C), temperature source Oral, resp. rate 18, height 5\' 6"  (1.676 m), weight 91 kg, SpO2 93 %.  Intake/Output Summary (Last 24 hours) at 05/27/2021 1022 Last data filed at 05/26/2021 2252 Gross per 24 hour  Intake 717 ml  Output 1100 ml  Net -383 ml   Filed Weights   05/21/21 0300 05/23/21 0536 05/24/21 0600  Weight: 90.7 kg 90.2 kg 91 kg    Exam: Awake Alert, Oriented *3, No new F.N deficits, Normal affect [place].AT,PERRAL Supple Neck,No JVD, No cervical lymphadenopathy appriciated.  Symmetrical Chest wall movement, Good air movement bilaterally, CTAB RRR,No Gallops,Rubs or new Murmurs, No Parasternal Heave +ve B.Sounds, Abd Soft, Non tender, No organomegaly appriciated, No rebound -guarding or rigidity. No Cyanosis, Clubbing or edema, No new Rash or bruise   PERTINENT RADIOLOGIC STUDIES: No results found.   PERTINENT LAB RESULTS: CBC: No results for input(s): WBC, HGB, HCT, PLT in the last 72 hours. CMET CMP     Component Value  Date/Time   NA 138 05/22/2021 0408   K 3.9 05/22/2021 0408   CL 100 05/22/2021 0408   CO2 30 05/22/2021 0408   GLUCOSE 108 (H) 05/22/2021 0408   BUN 22 05/22/2021 0408   CREATININE 0.52 (L) 05/22/2021 0408   CREATININE 0.73 08/06/2019 1254   CALCIUM 8.1 (L) 05/22/2021 0408   PROT 6.1 (L) 05/22/2021 0408   ALBUMIN 3.0 (L) 05/22/2021 0408   AST 117 (H) 05/22/2021 0408   ALT 385 (H) 05/22/2021 0408   ALKPHOS 65 05/22/2021 0408   BILITOT 1.2 05/22/2021 0408   GFRNONAA >60 05/22/2021 0408   GFRNONAA 89 08/06/2019 1254   GFRAA >60 10/17/2019 0600   GFRAA 103 08/06/2019 1254    GFR Estimated Creatinine Clearance: 77.8 mL/min (A) (by C-G formula based on SCr of 0.52 mg/dL (L)). No results for input(s): LIPASE, AMYLASE in the last 72 hours. No results for input(s): CKTOTAL, CKMB, CKMBINDEX, TROPONINI in the last 72 hours. Invalid input(s): POCBNP No results for input(s): DDIMER in the last 72 hours. No results for input(s): HGBA1C in the last 72 hours. No results for input(s): CHOL, HDL, LDLCALC, TRIG, CHOLHDL, LDLDIRECT in the last 72 hours. No results for input(s): TSH, T4TOTAL, T3FREE, THYROIDAB in the last 72 hours.  Invalid input(s): FREET3 No results for input(s): VITAMINB12, FOLATE, FERRITIN, TIBC, IRON, RETICCTPCT in the last 72 hours. Coags: No results for input(s): INR in the last 72 hours.  Invalid input(s): PT Microbiology: Recent Results (from the past 240 hour(s))  Resp Panel by RT-PCR (Flu A&B, Covid) Nasopharyngeal Swab     Status: None   Collection Time: 05/19/21 12:35 PM   Specimen: Nasopharyngeal Swab; Nasopharyngeal(NP) swabs in vial transport medium  Result Value  Ref Range Status   SARS Coronavirus 2 by RT PCR NEGATIVE NEGATIVE Final    Comment: (NOTE) SARS-CoV-2 target nucleic acids are NOT DETECTED.  The SARS-CoV-2 RNA is generally detectable in upper respiratory specimens during the acute phase of infection. The lowest concentration of SARS-CoV-2 viral  copies this assay can detect is 138 copies/mL. A negative result does not preclude SARS-Cov-2 infection and should not be used as the sole basis for treatment or other patient management decisions. A negative result may occur with  improper specimen collection/handling, submission of specimen other than nasopharyngeal swab, presence of viral mutation(s) within the areas targeted by this assay, and inadequate number of viral copies(<138 copies/mL). A negative result must be combined with clinical observations, patient history, and epidemiological information. The expected result is Negative.  Fact Sheet for Patients:  EntrepreneurPulse.com.au  Fact Sheet for Healthcare Providers:  IncredibleEmployment.be  This test is no t yet approved or cleared by the [place] FDA and  has been authorized for detection and/or diagnosis of SARS-CoV-2 by FDA under an Emergency Use Authorization (EUA). This EUA will remain  in effect (meaning this test can be used) for the duration of the COVID-19 declaration under Section 564(b)(1) of the Act, 21 U.S.C.section 360bbb-3(b)(1), unless the authorization is terminated  or revoked sooner.       Influenza A by PCR NEGATIVE NEGATIVE Final   Influenza B by PCR NEGATIVE NEGATIVE Final    Comment: (NOTE) The Xpert Xpress SARS-CoV-2/FLU/RSV plus assay is intended as an aid in the diagnosis of influenza from Nasopharyngeal swab specimens and should not be used as a sole basis for treatment. Nasal washings and aspirates are unacceptable for Xpert Xpress SARS-CoV-2/FLU/RSV testing.  Fact Sheet for Patients: EntrepreneurPulse.com.au  Fact Sheet for Healthcare Providers: IncredibleEmployment.be  This test is not yet approved or cleared by the [place] FDA and has been authorized for detection and/or diagnosis of SARS-CoV-2 by FDA under an Emergency Use Authorization (EUA). This  EUA will remain in effect (meaning this test can be used) for the duration of the COVID-19 declaration under Section 564(b)(1) of the Act, 21 U.S.C. section 360bbb-3(b)(1), unless the authorization is terminated or revoked.  Performed at [facility], [address], [place], [place] [address]   Culture, Respiratory w Gram Stain     Status: None   Collection Time: 05/20/21  6:49 PM   Specimen: Tracheal Aspirate; Respiratory  Result Value Ref Range Status   Specimen Description TRACHEAL ASPIRATE  Final   Special Requests Immunocompromised  Final   Gram Stain   Final    FEW WBC PRESENT, PREDOMINANTLY MONONUCLEAR MODERATE GRAM POSITIVE COCCI RARE GRAM NEGATIVE RODS FEW GRAM POSITIVE RODS    Culture   Final    MODERATE Normal respiratory flora-no Staph aureus or Pseudomonas seen Performed at [facility], [address] [place], [place] [address]    Report Status 05/22/2021 FINAL  Final  Culture, blood (routine x 2)     Status: None   Collection Time: 05/20/21  8:27 PM   Specimen: BLOOD LEFT HAND  Result Value Ref Range Status   Specimen Description BLOOD LEFT HAND  Final   Special Requests   Final    BOTTLES DRAWN AEROBIC AND ANAEROBIC Blood Culture adequate volume   Culture   Final    NO GROWTH 5 DAYS Performed at [facility], [place] [address], [place], [place] [address]    Report Status 05/25/2021 FINAL  Final  Culture, blood (  routine x 2)     Status: None   Collection Time: 05/20/21  8:34 PM   Specimen: BLOOD  Result Value Ref Range Status   Specimen Description BLOOD RIGHT ANTECUBITAL  Final   Special Requests   Final    BOTTLES DRAWN AEROBIC AND ANAEROBIC Blood Culture adequate volume   Culture   Final    NO GROWTH 5 DAYS Performed at [facility], [address], [place], [place] [address]    Report Status 05/25/2021 FINAL  Final  MRSA Next Gen by PCR, Nasal     Status: None   Collection Time: 05/21/21  4:28 AM   Specimen: Nasal Mucosa; Nasal  Swab  Result Value Ref Range Status   MRSA by PCR Next Gen NOT DETECTED NOT DETECTED Final    Comment: (NOTE) The GeneXpert MRSA Assay (FDA approved for NASAL specimens only), is one component of a comprehensive MRSA colonization surveillance program. It is not intended to diagnose MRSA infection nor to guide or monitor treatment for MRSA infections. Test performance is not FDA approved in patients less than 56 years old. Performed at [facility], [place] [address], [place], [place] [address]     FURTHER DISCHARGE INSTRUCTIONS:  Get Medicines reviewed and adjusted: Please take all your medications with you for your next visit with your Primary MD  Laboratory/radiological data: Please request your Primary MD to go over all hospital tests and procedure/radiological results at the follow up, please ask your Primary MD to get all Hospital records sent to his/her office.  In some cases, they will be blood work, cultures and biopsy results pending at the time of your discharge. Please request that your primary care M.D. goes through all the records of your hospital data and follows up on these results.  Also Note the following: If you experience worsening of your admission symptoms, develop shortness of breath, life threatening emergency, suicidal or homicidal thoughts you must seek medical attention immediately by calling 911 or calling your MD immediately  if symptoms less severe.  You must read complete instructions/literature along with all the possible adverse reactions/side effects for all the Medicines you take and that have been prescribed to you. Take any new Medicines after you have completely understood and accpet all the possible adverse reactions/side effects.   Do not drive when taking Pain medications or sleeping medications (Benzodaizepines)  Do not take more than prescribed Pain, Sleep and Anxiety Medications. It is not advisable to combine anxiety,sleep and pain  medications without talking with your primary care practitioner  Special Instructions: If you have smoked or chewed Tobacco  in the last 2 yrs please stop smoking, stop any regular Alcohol  and or any Recreational drug use.  Wear Seat belts while driving.  Please note: You were cared for by a hospitalist during your hospital stay. Once you are discharged, your primary care physician will handle any further medical issues. Please note that NO REFILLS for any discharge medications will be authorized once you are discharged, as it is imperative that you return to your primary care physician (or establish a relationship with a primary care physician if you do not have one) for your post hospital discharge needs so that they can reassess your need for medications and monitor your lab values.  Total Time spent coordinating discharge including counseling, education and face to face time equals 35 minutes.  SignedOren Binet 05/27/2021 10:22 AM

## 2021-05-29 ENCOUNTER — Inpatient Hospital Stay (HOSPITAL_COMMUNITY)
Admission: EM | Admit: 2021-05-29 | Discharge: 2021-05-31 | DRG: 189 | Disposition: A | Discharge status: Home Health | Payer: Medicare HMO | Attending: Internal Medicine | Admitting: Internal Medicine

## 2021-05-29 ENCOUNTER — Encounter (HOSPITAL_COMMUNITY): Payer: Self-pay | Admitting: Emergency Medicine

## 2021-05-29 ENCOUNTER — Emergency Department (HOSPITAL_COMMUNITY): Payer: Medicare HMO

## 2021-05-29 ENCOUNTER — Other Ambulatory Visit: Payer: Self-pay

## 2021-05-29 DIAGNOSIS — I083 Combined rheumatic disorders of mitral, aortic and tricuspid valves: Secondary | ICD-10-CM | POA: Diagnosis present

## 2021-05-29 DIAGNOSIS — K761 Chronic passive congestion of liver: Secondary | ICD-10-CM | POA: Diagnosis present

## 2021-05-29 DIAGNOSIS — D509 Iron deficiency anemia, unspecified: Secondary | ICD-10-CM | POA: Diagnosis present

## 2021-05-29 DIAGNOSIS — Z8249 Family history of ischemic heart disease and other diseases of the circulatory system: Secondary | ICD-10-CM | POA: Diagnosis not present

## 2021-05-29 DIAGNOSIS — G14 Postpolio syndrome: Secondary | ICD-10-CM | POA: Diagnosis not present

## 2021-05-29 DIAGNOSIS — I252 Old myocardial infarction: Secondary | ICD-10-CM

## 2021-05-29 DIAGNOSIS — J9622 Acute and chronic respiratory failure with hypercapnia: Secondary | ICD-10-CM | POA: Diagnosis not present

## 2021-05-29 DIAGNOSIS — Z888 Allergy status to other drugs, medicaments and biological substances status: Secondary | ICD-10-CM | POA: Diagnosis not present

## 2021-05-29 DIAGNOSIS — M6281 Muscle weakness (generalized): Secondary | ICD-10-CM | POA: Diagnosis not present

## 2021-05-29 DIAGNOSIS — J9621 Acute and chronic respiratory failure with hypoxia: Secondary | ICD-10-CM | POA: Diagnosis not present

## 2021-05-29 DIAGNOSIS — E785 Hyperlipidemia, unspecified: Secondary | ICD-10-CM | POA: Diagnosis not present

## 2021-05-29 DIAGNOSIS — E8729 Other acidosis: Secondary | ICD-10-CM | POA: Diagnosis not present

## 2021-05-29 DIAGNOSIS — K219 Gastro-esophageal reflux disease without esophagitis: Secondary | ICD-10-CM | POA: Diagnosis present

## 2021-05-29 DIAGNOSIS — Z8551 Personal history of malignant neoplasm of bladder: Secondary | ICD-10-CM | POA: Diagnosis not present

## 2021-05-29 DIAGNOSIS — J9602 Acute respiratory failure with hypercapnia: Secondary | ICD-10-CM

## 2021-05-29 DIAGNOSIS — I10 Essential (primary) hypertension: Secondary | ICD-10-CM | POA: Diagnosis present

## 2021-05-29 DIAGNOSIS — G9341 Metabolic encephalopathy: Secondary | ICD-10-CM | POA: Diagnosis not present

## 2021-05-29 DIAGNOSIS — E7220 Disorder of urea cycle metabolism, unspecified: Secondary | ICD-10-CM | POA: Diagnosis not present

## 2021-05-29 DIAGNOSIS — D500 Iron deficiency anemia secondary to blood loss (chronic): Secondary | ICD-10-CM | POA: Diagnosis not present

## 2021-05-29 DIAGNOSIS — E8770 Fluid overload, unspecified: Secondary | ICD-10-CM | POA: Diagnosis present

## 2021-05-29 DIAGNOSIS — Z79899 Other long term (current) drug therapy: Secondary | ICD-10-CM

## 2021-05-29 DIAGNOSIS — Z20822 Contact with and (suspected) exposure to covid-19: Secondary | ICD-10-CM | POA: Diagnosis present

## 2021-05-29 DIAGNOSIS — Z955 Presence of coronary angioplasty implant and graft: Secondary | ICD-10-CM

## 2021-05-29 DIAGNOSIS — Z87891 Personal history of nicotine dependence: Secondary | ICD-10-CM

## 2021-05-29 DIAGNOSIS — I2510 Atherosclerotic heart disease of native coronary artery without angina pectoris: Secondary | ICD-10-CM | POA: Diagnosis not present

## 2021-05-29 DIAGNOSIS — G4710 Hypersomnia, unspecified: Secondary | ICD-10-CM | POA: Diagnosis present

## 2021-05-29 DIAGNOSIS — B91 Sequelae of poliomyelitis: Secondary | ICD-10-CM | POA: Diagnosis not present

## 2021-05-29 DIAGNOSIS — R911 Solitary pulmonary nodule: Secondary | ICD-10-CM | POA: Diagnosis present

## 2021-05-29 DIAGNOSIS — R5381 Other malaise: Secondary | ICD-10-CM | POA: Diagnosis not present

## 2021-05-29 DIAGNOSIS — I517 Cardiomegaly: Secondary | ICD-10-CM | POA: Diagnosis not present

## 2021-05-29 DIAGNOSIS — G479 Sleep disorder, unspecified: Secondary | ICD-10-CM | POA: Diagnosis present

## 2021-05-29 DIAGNOSIS — R531 Weakness: Secondary | ICD-10-CM | POA: Diagnosis not present

## 2021-05-29 DIAGNOSIS — Z7982 Long term (current) use of aspirin: Secondary | ICD-10-CM

## 2021-05-29 DIAGNOSIS — Z7902 Long term (current) use of antithrombotics/antiplatelets: Secondary | ICD-10-CM

## 2021-05-29 LAB — BLOOD GAS, VENOUS
Acid-Base Excess: 11.1 mmol/L — ABNORMAL HIGH (ref 0.0–2.0)
Bicarbonate: 32.3 mmol/L — ABNORMAL HIGH (ref 20.0–28.0)
Drawn by: 42237
FIO2: 28
O2 Saturation: 75.3 %
Patient temperature: 37.2
pCO2, Ven: 96.7 mmHg (ref 44.0–60.0)
pH, Ven: 7.227 — ABNORMAL LOW (ref 7.250–7.430)
pO2, Ven: 51.9 mmHg — ABNORMAL HIGH (ref 32.0–45.0)

## 2021-05-29 LAB — TSH: TSH: 0.742 u[IU]/mL (ref 0.350–4.500)

## 2021-05-29 LAB — RESP PANEL BY RT-PCR (FLU A&B, COVID) ARPGX2
Influenza A by PCR: NEGATIVE
Influenza B by PCR: NEGATIVE
SARS Coronavirus 2 by RT PCR: NEGATIVE

## 2021-05-29 LAB — BLOOD GAS, ARTERIAL
Acid-Base Excess: 12 mmol/L — ABNORMAL HIGH (ref 0.0–2.0)
Bicarbonate: 34.5 mmol/L — ABNORMAL HIGH (ref 20.0–28.0)
Drawn by: 27016
FIO2: 30
O2 Saturation: 93.6 %
Patient temperature: 37
pCO2 arterial: 78.7 mmHg (ref 32.0–48.0)
pH, Arterial: 7.311 — ABNORMAL LOW (ref 7.350–7.450)
pO2, Arterial: 73.9 mmHg — ABNORMAL LOW (ref 83.0–108.0)

## 2021-05-29 LAB — CBC WITH DIFFERENTIAL/PLATELET
Abs Immature Granulocytes: 0.36 10*3/uL — ABNORMAL HIGH (ref 0.00–0.07)
Basophils Absolute: 0.2 10*3/uL — ABNORMAL HIGH (ref 0.0–0.1)
Basophils Relative: 2 %
Eosinophils Absolute: 0.7 10*3/uL — ABNORMAL HIGH (ref 0.0–0.5)
Eosinophils Relative: 7 %
HCT: 37.9 % — ABNORMAL LOW (ref 39.0–52.0)
Hemoglobin: 9.6 g/dL — ABNORMAL LOW (ref 13.0–17.0)
Immature Granulocytes: 4 %
Lymphocytes Relative: 15 %
Lymphs Abs: 1.4 10*3/uL (ref 0.7–4.0)
MCH: 21.5 pg — ABNORMAL LOW (ref 26.0–34.0)
MCHC: 25.3 g/dL — ABNORMAL LOW (ref 30.0–36.0)
MCV: 85 fL (ref 80.0–100.0)
Monocytes Absolute: 1.1 10*3/uL — ABNORMAL HIGH (ref 0.1–1.0)
Monocytes Relative: 12 %
Neutro Abs: 5.8 10*3/uL (ref 1.7–7.7)
Neutrophils Relative %: 60 %
Platelets: 439 10*3/uL — ABNORMAL HIGH (ref 150–400)
RBC: 4.46 MIL/uL (ref 4.22–5.81)
RDW: 18 % — ABNORMAL HIGH (ref 11.5–15.5)
WBC: 9.5 10*3/uL (ref 4.0–10.5)
nRBC: 0.3 % — ABNORMAL HIGH (ref 0.0–0.2)

## 2021-05-29 LAB — COMPREHENSIVE METABOLIC PANEL
ALT: 61 U/L — ABNORMAL HIGH (ref 0–44)
AST: 21 U/L (ref 15–41)
Albumin: 3.7 g/dL (ref 3.5–5.0)
Alkaline Phosphatase: 69 U/L (ref 38–126)
Anion gap: 5 (ref 5–15)
BUN: 19 mg/dL (ref 8–23)
CO2: 37 mmol/L — ABNORMAL HIGH (ref 22–32)
Calcium: 8.6 mg/dL — ABNORMAL LOW (ref 8.9–10.3)
Chloride: 96 mmol/L — ABNORMAL LOW (ref 98–111)
Creatinine, Ser: 0.5 mg/dL — ABNORMAL LOW (ref 0.61–1.24)
GFR, Estimated: >60 mL/min (ref >60)
Glucose, Bld: 171 mg/dL — ABNORMAL HIGH (ref 70–99)
Potassium: 4.2 mmol/L (ref 3.5–5.1)
Sodium: 138 mmol/L (ref 135–145)
Total Bilirubin: 0.3 mg/dL (ref 0.3–1.2)
Total Protein: 7.4 g/dL (ref 6.5–8.1)

## 2021-05-29 LAB — URINALYSIS, COMPLETE (UACMP) WITH MICROSCOPIC
Bacteria, UA: NONE SEEN
Bilirubin Urine: NEGATIVE
Glucose, UA: NEGATIVE mg/dL
Hgb urine dipstick: NEGATIVE
Ketones, ur: NEGATIVE mg/dL
Leukocytes,Ua: NEGATIVE
Nitrite: NEGATIVE
Protein, ur: NEGATIVE mg/dL
Specific Gravity, Urine: 1.015 (ref 1.005–1.030)
pH: 6 (ref 5.0–8.0)

## 2021-05-29 LAB — VITAMIN B12: Vitamin B-12: 449 pg/mL (ref 180–914)

## 2021-05-29 LAB — AMMONIA: Ammonia: 36 umol/L — ABNORMAL HIGH (ref 9–35)

## 2021-05-29 LAB — T4, FREE: Free T4: 0.92 ng/dL (ref 0.61–1.12)

## 2021-05-29 LAB — HEMOGLOBIN A1C
Hgb A1c MFr Bld: 6.3 % — ABNORMAL HIGH (ref 4.8–5.6)
Mean Plasma Glucose: 134.11 mg/dL

## 2021-05-29 LAB — TROPONIN I (HIGH SENSITIVITY)
Troponin I (High Sensitivity): 37 ng/L — ABNORMAL HIGH (ref <18)
Troponin I (High Sensitivity): 35 ng/L — ABNORMAL HIGH (ref <18)

## 2021-05-29 LAB — FOLATE: Folate: 11.4 ng/mL (ref >5.9)

## 2021-05-29 LAB — BRAIN NATRIURETIC PEPTIDE: B Natriuretic Peptide: 188 pg/mL — ABNORMAL HIGH (ref 0.0–100.0)

## 2021-05-29 MED ORDER — FUROSEMIDE 10 MG/ML IJ SOLN
40.0000 mg | Freq: Once | INTRAMUSCULAR | Status: AC
  Administered 2021-05-29: 15:00:00 40 mg via INTRAVENOUS
  Filled 2021-05-29: qty 4

## 2021-05-29 MED ORDER — ACETAMINOPHEN 650 MG RE SUPP: 650.0000 mg | Freq: Four times a day (QID) | RECTAL | Status: DC | PRN

## 2021-05-29 MED ORDER — CLOPIDOGREL BISULFATE 75 MG PO TABS
75.0000 mg | ORAL_TABLET | Freq: Every day | ORAL | Status: DC
  Administered 2021-05-30 – 2021-05-31 (×2): 75 mg via ORAL
  Filled 2021-05-29 (×2): qty 1

## 2021-05-29 MED ORDER — CHLORHEXIDINE GLUCONATE CLOTH 2 % EX PADS
6.0000 | MEDICATED_PAD | Freq: Every day | CUTANEOUS | Status: DC
  Administered 2021-05-29 – 2021-05-31 (×2): 6 via TOPICAL

## 2021-05-29 MED ORDER — PANTOPRAZOLE SODIUM 40 MG PO TBEC
40.0000 mg | DELAYED_RELEASE_TABLET | Freq: Every day | ORAL | Status: DC
  Administered 2021-05-30 – 2021-05-31 (×2): 40 mg via ORAL
  Filled 2021-05-29 (×2): qty 1

## 2021-05-29 MED ORDER — ENOXAPARIN SODIUM 40 MG/0.4ML IJ SOSY
40.0000 mg | PREFILLED_SYRINGE | INTRAMUSCULAR | Status: DC
  Administered 2021-05-29 – 2021-05-30 (×2): 40 mg via SUBCUTANEOUS
  Filled 2021-05-29 (×2): qty 0.4

## 2021-05-29 MED ORDER — ASPIRIN EC 81 MG PO TBEC
81.0000 mg | DELAYED_RELEASE_TABLET | Freq: Every day | ORAL | Status: DC
  Administered 2021-05-30 – 2021-05-31 (×2): 81 mg via ORAL
  Filled 2021-05-29 (×2): qty 1

## 2021-05-29 MED ORDER — IPRATROPIUM-ALBUTEROL 0.5-2.5 (3) MG/3ML IN SOLN
3.0000 mL | Freq: Once | RESPIRATORY_TRACT | Status: AC
  Administered 2021-05-29: 11:00:00 3 mL via RESPIRATORY_TRACT
  Filled 2021-05-29: qty 3

## 2021-05-29 MED ORDER — ONDANSETRON HCL 4 MG PO TABS: 4.0000 mg | ORAL_TABLET | Freq: Four times a day (QID) | ORAL | Status: DC | PRN

## 2021-05-29 MED ORDER — ACETAMINOPHEN 325 MG PO TABS: 650.0000 mg | ORAL_TABLET | Freq: Four times a day (QID) | ORAL | Status: DC | PRN

## 2021-05-29 MED ORDER — ONDANSETRON HCL 4 MG/2ML IJ SOLN: 4.0000 mg | Freq: Four times a day (QID) | INTRAMUSCULAR | Status: DC | PRN

## 2021-05-29 MED ORDER — ATORVASTATIN CALCIUM 40 MG PO TABS
40.0000 mg | ORAL_TABLET | Freq: Every day | ORAL | Status: DC
  Administered 2021-05-29 – 2021-05-30 (×2): 40 mg via ORAL
  Filled 2021-05-29 (×3): qty 1

## 2021-05-29 NOTE — ED Notes (Signed)
Attempted RA on pt. Pt dropped to 83% in 5 minutes. 2 lpm nasal cannula applied.

## 2021-05-29 NOTE — ED Triage Notes (Signed)
Pt BIB EMS from [place], family requested for pt to be evaluated due to "low SPO2". Pt on chronic 3LNC with SPO2 of 98%. Pt was admitted to [facility]x home for physical therapy due to weakness; facility staff reports not seeing low SPO2; lowest was 92%. Pt denies any symptoms, no SOB, no chest pain. Pt is ao x 4.   144/70 HR 68 SPO2 98% on chronic 3LNC

## 2021-05-29 NOTE — ED Notes (Signed)
Off BiPAP on 3 liter nasal cannula. Patient saturation 98 up drinking water.

## 2021-05-29 NOTE — ED Provider Notes (Signed)
[place] Provider Note   CSN: [id number] Arrival date & time: 05/29/21  9735     History No chief complaint on file.   [name] is a 80 y.o. male.  He is brought in by EMS from his rehab facility for low oxygen saturation.  He is on 3 L nasal cannula since discharge from the hospital.  He was admitted to [facility] for NSTEMI and had a catheterization with no intervention.  He had a respiratory event which required intubation.  Thought to be possible CO2 retention.  He was discharged on oxygen.  He denies any complaints.  No fever cough chest pain shortness of breath abdominal pain vomiting diarrhea.  The history is provided by the patient and the EMS personnel.  Shortness of Breath Severity:  Mild Onset quality:  Unable to specify Timing:  Intermittent Progression:  Unchanged Chronicity:  Chronic Context: activity   Relieved by:  None tried Worsened by:  Activity Ineffective treatments:  Oxygen Associated symptoms: no abdominal pain, no chest pain, no cough, no fever, no headaches, no neck pain, no rash, no sore throat, no sputum production, no syncope, no vomiting and no wheezing       Past Medical History:  Diagnosis Date   AAA (abdominal aortic aneurysm)    Bladder cancer ([place])    s/p TURBT 09/26/19   Blood transfusion without reported diagnosis    Coronary artery disease    stents placed   GERD (gastroesophageal reflux disease)    HOH (hard of hearing)    Hyperlipidemia    Hypertension    Iron deficiency anemia 05/14/2018   Polio    hx of. right arm atrophy    Patient Active Problem List   Diagnosis Date Noted   Acute encephalopathy    NSTEMI (non-ST elevated myocardial infarction) ([place]) 05/19/2021   Acute respiratory failure with hypoxia ([place]) 05/19/2021   GERD (gastroesophageal reflux disease)    HOH (hard of hearing)    Elevated liver enzymes    Abdominal aortic aneurysm (AAA) 10/14/2019   Abdominal aortic aneurysm (AAA)  greater than 5.5 cm in diameter in male 10/14/2019   Hematuria 09/06/2019   Bladder mass 09/06/2019   AAA (abdominal aortic aneurysm) 09/06/2019   S/P arterial stent 09/06/2019   Acute blood loss anemia 08/08/2019   Iron deficiency anemia due to chronic blood loss 08/08/2019   Thrombocytosis 08/08/2019   Symptomatic anemia 08/07/2019   Coronary artery disease 08/07/2019   Absolute anemia 05/15/2018   Guaiac positive stools 05/15/2018   Iron deficiency anemia 05/14/2018   Leukocytosis 10/09/2017   Murmur 05/15/2014   Polio 05/15/2014   HTN (hypertension) 11/23/2010   CLAUDICATION 05/19/2009   Elevated lipids 09/17/2008   CORONARY ARTERY ANEURYSM 09/17/2008    Past Surgical History:  Procedure Laterality Date   ABDOMINAL AORTIC ANEURYSM REPAIR N/A 10/14/2019   Procedure: OPEN ABDOMINAL AORTIC ANEURYSM REPAIR USING HEMASHIELD GOLD 32mm x 30cm GRAFT;  Surgeon: Angelia Mould, MD;  Location: [facility] OR;  Service: Vascular;  Laterality: N/A;   CARDIAC CATHETERIZATION  07-09-2007   CHOLECYSTECTOMY     COLONOSCOPY WITH PROPOFOL N/A 05/31/2018   Procedure: COLONOSCOPY WITH PROPOFOL;  Surgeon: Rogene Houston, MD;  Location: [facility];  Service: Endoscopy;  Laterality: N/A;  1:45   CORONARY STENT PLACEMENT     x3 vessels bifurcated single vessel   CYSTOSCOPY W/ RETROGRADES Bilateral 09/26/2019   Procedure: CYSTOSCOPY WITH RETROGRADE PYELOGRAM;  Surgeon: Alexis Frock, MD;  Location:  [facility] ORS;  Service: Urology;  Laterality: Bilateral;   ESOPHAGOGASTRODUODENOSCOPY (EGD) WITH PROPOFOL N/A 05/31/2018   Procedure: ESOPHAGOGASTRODUODENOSCOPY (EGD) WITH PROPOFOL;  Surgeon: Rogene Houston, MD;  Location: [facility];  Service: Endoscopy;  Laterality: N/A;   ESOPHAGOGASTRODUODENOSCOPY (EGD) WITH PROPOFOL N/A 08/08/2019   Procedure: ESOPHAGOGASTRODUODENOSCOPY (EGD) WITH PROPOFOL;  Surgeon: Rogene Houston, MD;  Location: [facility];  Service: Endoscopy;  Laterality: N/A;   KNEE SURGERY      Left knee  steel rod   LEFT HEART CATH AND CORONARY ANGIOGRAPHY N/A 05/20/2021   Procedure: LEFT HEART CATH AND CORONARY ANGIOGRAPHY;  Surgeon: Leonie Man, MD;  Location: [facility] [facility];  Service: Cardiovascular;  Laterality: N/A;   POLYPECTOMY  05/31/2018   Procedure: POLYPECTOMY;  Surgeon: Rogene Houston, MD;  Location: [facility];  Service: Endoscopy;;  sigmoid   TRANSURETHRAL RESECTION OF BLADDER TUMOR N/A 09/26/2019   Procedure: TRANSURETHRAL RESECTION OF BLADDER TUMOR (TURBT);  Surgeon: Alexis Frock, MD;  Location: [facility];  Service: Urology;  Laterality: N/A;  1 HR       Family History  Problem Relation Age of Onset   Heart attack Brother 72       died   Hypertension Mother    Heart attack Father     Social History   Tobacco Use   Smoking status: Former    Packs/day: 1.00    Years: 15.00    Pack years: 15.00    Types: Cigarettes    Quit date: 06/12/2004    Years since quitting: 16.9   Smokeless tobacco: Never  Vaping Use   Vaping Use: Never used  Substance Use Topics   Alcohol use: No    Alcohol/week: 0.0 standard drinks   Drug use: No    Home Medications Prior to Admission medications   Medication Sig Start Date End Date Taking? Authorizing Provider  aspirin EC 81 MG tablet Take 81 mg by mouth daily.    [provider]  atorvastatin (LIPITOR) 40 MG tablet Take 1 tablet by mouth once daily 04/25/21   Josue Hector, MD  clopidogrel (PLAVIX) 75 MG tablet Take 1 tablet by mouth once daily 11/26/20   Josue Hector, MD  ELDERBERRY PO Take 1 tablet by mouth daily.     [provider]  ferrous sulfate 325 (65 FE) MG tablet Take 1 tablet (325 mg total) by mouth 2 (two) times daily with a meal. Patient not taking: Reported on 05/19/2021 08/08/19   Orson Eva, MD  nitroGLYCERIN (NITROSTAT) 0.4 MG SL tablet Place 1 tablet (0.4 mg total) under the tongue every 5 (five) minutes x 3 doses as needed for chest pain. 05/04/20   Josue Hector,  MD  ondansetron (ZOFRAN) 4 MG tablet Take 8 mg by mouth 2 (two) times daily. 05/16/21   [provider]  pantoprazole (PROTONIX) 40 MG tablet Take 1 tablet (40 mg total) by mouth daily. Take 30 min before meals 09/07/19   Aline August, MD  Probiotic Product (PROBIOTIC PO) Take 1 tablet by mouth daily.    [provider]    Allergies    Tramadol  Review of Systems   Review of Systems  Constitutional:  Negative for fever.  HENT:  Negative for sore throat.   Eyes:  Negative for visual disturbance.  Respiratory:  Positive for shortness of breath. Negative for cough, sputum production and wheezing.   Cardiovascular:  Negative for chest pain and syncope.  Gastrointestinal:  Negative for abdominal pain and vomiting.  Genitourinary:  Negative for dysuria.  Musculoskeletal:  Negative for neck pain.  Skin:  Negative for rash.  Neurological:  Negative for headaches.   Physical Exam Updated Vital Signs BP 125/75    Pulse 66    Temp 99 F (37.2 C) (Oral)    Resp (!) 23    Ht 5\' 6"  (1.676 m)    Wt 88.9 kg    SpO2 94%    BMI 31.64 kg/m   Physical Exam Vitals and nursing note reviewed.  Constitutional:      General: He is not in acute distress.    Appearance: Normal appearance. He is well-developed.  HENT:     Head: Normocephalic and atraumatic.  Eyes:     Conjunctiva/sclera: Conjunctivae normal.  Cardiovascular:     Rate and Rhythm: Normal rate and regular rhythm.     Heart sounds: Murmur heard.  Pulmonary:     Effort: Pulmonary effort is normal. No respiratory distress.     Breath sounds: Normal breath sounds.  Abdominal:     Palpations: Abdomen is soft.     Tenderness: There is no abdominal tenderness. There is no guarding or rebound.  Musculoskeletal:        General: No swelling.     Cervical back: Neck supple.     Right lower leg: No edema.     Left lower leg: No edema.  Skin:    General: Skin is warm and dry.     Capillary Refill: Capillary refill takes  less than 2 seconds.  Neurological:     General: No focal deficit present.     Mental Status: He is alert.     Comments: Patient is awake and alert.  He is a little unclear on some details of his last hospitalization.  When I went to check on him later he was snoring, would arouse but fall asleep quickly again.  Moving all extremities without any focal deficits.  Psychiatric:        Mood and Affect: Mood normal.    ED Results / Procedures / Treatments   Labs (all labs ordered are listed, but only abnormal results are displayed) Labs Reviewed  COMPREHENSIVE METABOLIC PANEL - Abnormal; Notable for the following components:      Result Value   Chloride 96 (*)    CO2 37 (*)    Glucose, Bld 171 (*)    Creatinine, Ser 0.50 (*)    Calcium 8.6 (*)    ALT 61 (*)    All other components within normal limits  CBC WITH DIFFERENTIAL/PLATELET - Abnormal; Notable for the following components:   Hemoglobin 9.6 (*)    HCT 37.9 (*)    MCH 21.5 (*)    MCHC 25.3 (*)    RDW 18.0 (*)    Platelets 439 (*)    nRBC 0.3 (*)    Monocytes Absolute 1.1 (*)    Eosinophils Absolute 0.7 (*)    Basophils Absolute 0.2 (*)    Abs Immature Granulocytes 0.36 (*)    All other components within normal limits  BRAIN NATRIURETIC PEPTIDE - Abnormal; Notable for the following components:   B Natriuretic Peptide 188.0 (*)    All other components within normal limits  BLOOD GAS, VENOUS - Abnormal; Notable for the following components:   pH, Ven 7.227 (*)    pCO2, Ven 96.7 (*)    pO2, Ven 51.9 (*)    Bicarbonate 32.3 (*)  Acid-Base Excess 11.1 (*)    All other components within normal limits  AMMONIA - Abnormal; Notable for the following components:   Ammonia 36 (*)    All other components within normal limits  BLOOD GAS, ARTERIAL - Abnormal; Notable for the following components:   pH, Arterial 7.311 (*)    pCO2 arterial 78.7 (*)    pO2, Arterial 73.9 (*)    Bicarbonate 34.5 (*)    Acid-Base Excess 12.0 (*)     All other components within normal limits  TROPONIN I (HIGH SENSITIVITY) - Abnormal; Notable for the following components:   Troponin I (High Sensitivity) 37 (*)    All other components within normal limits  TROPONIN I (HIGH SENSITIVITY) - Abnormal; Notable for the following components:   Troponin I (High Sensitivity) 35 (*)    All other components within normal limits  RESP PANEL BY RT-PCR (FLU A&B, COVID) ARPGX2  URINE CULTURE  URINALYSIS, COMPLETE (UACMP) WITH MICROSCOPIC  TSH  VITAMIN B12  FOLATE  T4, FREE  HEMOGLOBIN A1C  COMPREHENSIVE METABOLIC PANEL  CBC    EKG EKG Interpretation  Date/Time:  Sunday May 29 2021 10:07:02 EST Ventricular Rate:  68 PR Interval:  166 QRS Duration: 139 QT Interval:  423 QTC Calculation: 450 R Axis:   46 Text Interpretation: Sinus rhythm Right bundle branch block Baseline wander in lead(s) III aVF No significant change since prior 12/22 Confirmed by Aletta Edouard [phone]) on 05/29/2021 10:09:26 AM  Radiology DG Chest Port 1 View  Result Date: 05/29/2021 CLINICAL DATA:  Low oxygen saturation., weakness. EXAM: PORTABLE CHEST 1 VIEW COMPARISON:  CT examination dated May 21, 2021 FINDINGS: The heart is enlarged. Aortic arch atherosclerotic calcifications. Low lung volumes. No focal consolidation. Chronic interstitial markings. IMPRESSION: 1. Low lung volumes without evidence of focal consolidation or large pleural effusion. 2.  Cardiomegaly. Electronically Signed   By: Keane Police D.O.   On: 05/29/2021 10:22    Procedures .Critical Care Performed by: Hayden Rasmussen, MD Authorized by: Hayden Rasmussen, MD   Critical care provider statement:    Critical care time (minutes):  45   Critical care time was exclusive of:  Separately billable procedures and treating other patients   Critical care was necessary to treat or prevent imminent or life-threatening deterioration of the following conditions:  Respiratory failure    Critical care was time spent personally by me on the following activities:  Development of treatment plan with patient or surrogate, discussions with consultants, evaluation of patient's response to treatment, examination of patient, obtaining history from patient or surrogate, ordering and performing treatments and interventions, ordering and review of laboratory studies, ordering and review of radiographic studies, pulse oximetry, re-evaluation of patient's condition and review of old charts   I assumed direction of critical care for this patient from another provider in my specialty: no     Medications Ordered in ED Medications  atorvastatin (LIPITOR) tablet 40 mg (has no administration in time range)  pantoprazole (PROTONIX) EC tablet 40 mg (40 mg Oral Not Given 05/29/21 1451)  clopidogrel (PLAVIX) tablet 75 mg (75 mg Oral Not Given 05/29/21 1450)  aspirin EC tablet 81 mg (0 mg Oral Hold 05/29/21 1451)  enoxaparin (LOVENOX) injection 40 mg (40 mg Subcutaneous Given 05/29/21 1510)  acetaminophen (TYLENOL) tablet 650 mg (has no administration in time range)    Or  acetaminophen (TYLENOL) suppository 650 mg (has no administration in time range)  ondansetron (ZOFRAN) tablet 4 mg (has no administration  in time range)    Or  ondansetron (ZOFRAN) injection 4 mg (has no administration in time range)  ipratropium-albuterol (DUONEB) 0.5-2.5 (3) MG/3ML nebulizer solution 3 mL (3 mLs Nebulization Given 05/29/21 1106)  furosemide (LASIX) injection 40 mg (40 mg Intravenous Given 05/29/21 1510)    ED Course  I have reviewed the triage vital signs and the nursing notes.  Pertinent labs & imaging results that were available during my care of the patient were reviewed by me and considered in my medical decision making (see chart for details).  Clinical Course as of 05/29/21 1717  Sun May 29, 2021  1026 Chest x-ray interpreted by me as cardiomegaly poor inspiratory effort.  No clear infiltrate. [MB]   23 Patient's daughter is here.  She said the patient was discharged without oxygen and they just put it on him last night at the rehab facility.  She has seen this daytime somnolence before.  She was unaware that he was told to get an outpatient sleep apnea study [MB]  1155 Patient placed on BiPAP for hypercapnia.  Discussed with [facility] hospitalist Dr. Carles Collet who will evaluate the patient for admission. [MB]    Clinical Course User Index [MB] Hayden Rasmussen, MD   MDM Rules/Calculators/A&P                         [name] was evaluated in Emergency Department on 05/29/2021 for the symptoms described in the history of present illness. He was evaluated in the context of the global COVID-19 pandemic, which necessitated consideration that the patient might be at risk for infection with the SARS-CoV-2 virus that causes COVID-19. Institutional protocols and algorithms that pertain to the evaluation of patients at risk for COVID-19 are in a state of rapid change based on information released by regulatory bodies including the CDC and federal and state organizations. These policies and algorithms were followed during the patient's care in the ED. This patient complains of possible hypoxia; this involves an extensive number of treatment Options and is a complaint that carries with it a high risk of complications and Morbidity. The differential includes COPD, CHF, hypercapnia, hypoxia, PE or pneumothorax, ACS  I ordered, reviewed and interpreted labs, which included CBC with normal white count, hemoglobin low improved from priors, chemistry showing elevated bicarb reflecting some possible chronic acidosis, BNP mildly elevated, troponin slightly elevated down from priors, VBG with acidosis and CO2 retention I ordered medication DuoNeb BiPAP I ordered imaging studies which included chest x-ray and I independently    visualized and interpreted imaging which showed poor inspiration low volumes Additional  history obtained from patient's daughter Previous records obtained and reviewed in epic including prior admission at [facility] I consulted [facility] hospitalist Dr.Tat And discussed lab and imaging findings  Critical Interventions: Initiation of BiPAP for patient's acute hypercapnia  After the interventions stated above, I reevaluated the patient and found patient to be improving in mental status.     Final Clinical Impression(s) / ED Diagnoses Final diagnoses:  Acute hypercapnic respiratory failure [facility])    Rx / DC Orders ED Discharge Orders     None        Hayden Rasmussen, MD 05/29/21 1723

## 2021-05-29 NOTE — H&P (Signed)
History and Physical  [name] YQM:[id number] DOB: 01-19-1941 DOA: 05/29/2021   PCP: Celene Squibb, MD   Patient coming from: Home  Chief Complaint: hypoxia, somnolence  HPI:  [name] is a 80 y.o. male with medical history of coronary artery disease with recent NSTEMI December 2022, gastric AVMs, bladder cancer s/p TURBT, hypertension, hyperlipidemia, CAD status post PCI to the LAD 2009 presenting from [facility] with somnolence and hypoxia.  The patient had a recent hospitalization from 05/20/2019 to 05/27/2021 when he was treated for NSTEMI.  Patient underwent heart catheterization on 05/20/2021 which showed patent LAD stent and moderate mid LAD stenosis of 50% with minimal diffuse disease elsewhere.  Post heart catheterization, the patient became unresponsive and he required intubation and was admitted to the ICU for acute hypoxic and hypercarbic respiratory failure.  It was felt that the patient likely had an underlying sleep disorder that likely contributed to his respiratory failure.  His hospitalization was prolonged by the development of pneumonia for which he completed 5 days of cefepime.  He had a CTA of the chest was negative for PE.  MRI was recommended, but the patient refused because he was claustrophobic.  He was discharged to SNF for 2 L nasal cannula. According to the patient's daughter at the bedside, she stated that he was not on any supplemental oxygen while she visited him on 05/28/2021.  Apparently he participated in physical therapy on that day and developed hypoxia down into the 60s with exertion.  Once he was rested his saturation came back up to the 90s.  Nevertheless, his therapy was aborted because of his hypoxia.  When his family came to visit him again on 05/30/2019 2 in the morning, the patient was noted to be somnolent sitting up in chair.  His oxygen saturation was checked, and it was in the 70s.  As result, the patient was brought to emergency department  for further evaluation. At the time of my evaluation, the patient is on BiPAP, but he is awake and answers simple questions appropriately.  He does not recall the events of earlier in the day, but he denies any fevers, chills, headache, chest pain, shortness of breath, nausea, vomiting, diarrhea, abd pain.  Notably, the patient was not discharged to [facility] with any hypnotic or opioid medications In retrospect, the patient's daughter states that the patient has had daytime hypersomnolence "for at least a few months" given prior to his hospitalization for NSTEMI. ED In the emergency department, the patient had low-grade temperature 99.0 F.  He was hemodynamically stable with oxygen saturation of 95% FiO2 30%.  The patient did desaturate into the 80s initially on room air prior to BiPAP.  BMP showed sodium 138, potassium 4.2, bicarbonate 37, serum creatinine 0.50.  LFTs were unremarkable.  WBC 9.5, hemoglobin 9.6, platelets 139,000.  Troponin 37.  BNP 188.  Chest x-ray showed low volumes without any consolidation. VBG showed pH 7.2 2/96/51/on 2 L   Assessment/Plan: Acute on chronic respiratory failure with hypoxia and hypercarbia -Given the patient's history of daytime hypersomnolence and " difficulty waking up after surgery", I suspect the patient has an underlying sleep disorder -Wean BiPAP as tolerated -Patient was [facility] from [facility] on [facility]21 with 2 L nasal cannula -Patient appears to be slightly on the hypervolemic side>> give Lasix x1 IV -TSH -Minimize any hypnotic or sedative medications  Acute metabolic encephalopathy -Secondary to hypercapnic respiratory failure -TSH -B12 -Ammonia -UA  Coronary artery disease -  No chest pain presently -Recent NSTEMI 05/19/2021 -Heart catheterization 05/21/2019.  As discussed above -History of PCI to LAD 2009 -Continue aspirin Plavix -EKG without concerning ischemic change -05/11/2021 echo EF 65-70%, no WMA, moderate AS, increased  LVEDP  Hyperlipidemia -Continue statin  Right lung nodule -Outpatient surveillance  Iron deficiency anemia -Continue iron supplementation  GERD -Continue Protonix    Principal Problem:   Acute on chronic respiratory failure with hypoxia and hypercapnia (HCC)       Past Medical History:  Diagnosis Date   AAA (abdominal aortic aneurysm)    Bladder cancer ([place])    s/p TURBT 09/26/19   Blood transfusion without reported diagnosis    Coronary artery disease    stents placed   GERD (gastroesophageal reflux disease)    HOH (hard of hearing)    Hyperlipidemia    Hypertension    Iron deficiency anemia 05/14/2018   Polio    hx of. right arm atrophy   Past Surgical History:  Procedure Laterality Date   ABDOMINAL AORTIC ANEURYSM REPAIR N/A 10/14/2019   Procedure: OPEN ABDOMINAL AORTIC ANEURYSM REPAIR USING HEMASHIELD GOLD 93mm x 30cm GRAFT;  Surgeon: Angelia Mould, MD;  Location: [facility];  Service: Vascular;  Laterality: N/A;   CARDIAC CATHETERIZATION  07-09-2007   CHOLECYSTECTOMY     COLONOSCOPY WITH PROPOFOL N/A 05/31/2018   Procedure: COLONOSCOPY WITH PROPOFOL;  Surgeon: Rogene Houston, MD;  [facility];  Service: Endoscopy;  Laterality: N/A;  1:45   CORONARY STENT PLACEMENT     x3 vessels bifurcated single vessel   CYSTOSCOPY W/ RETROGRADES Bilateral 09/26/2019   Procedure: CYSTOSCOPY WITH RETROGRADE PYELOGRAM;  Surgeon: Alexis Frock, MD;  Location: [facility];  Service: Urology;  Laterality: Bilateral;   ESOPHAGOGASTRODUODENOSCOPY (EGD) WITH PROPOFOL N/A 05/31/2018   Procedure: ESOPHAGOGASTRODUODENOSCOPY (EGD) WITH PROPOFOL;  Surgeon: Rogene Houston, MD;  [facility];  Service: Endoscopy;  Laterality: N/A;   ESOPHAGOGASTRODUODENOSCOPY (EGD) WITH PROPOFOL N/A 08/08/2019   Procedure: ESOPHAGOGASTRODUODENOSCOPY (EGD) WITH PROPOFOL;  Surgeon: Rogene Houston, MD;  [facility];  Service: Endoscopy;  Laterality: N/A;   KNEE SURGERY      Left knee  steel rod   LEFT HEART CATH AND CORONARY ANGIOGRAPHY N/A 05/20/2021   Procedure: LEFT HEART CATH AND CORONARY ANGIOGRAPHY;  Surgeon: Leonie Man, MD;  Location: [facility];  Service: Cardiovascular;  Laterality: N/A;   POLYPECTOMY  05/31/2018   Procedure: POLYPECTOMY;  Surgeon: Rogene Houston, MD;  [facility];  Service: Endoscopy;;  sigmoid   TRANSURETHRAL RESECTION OF BLADDER TUMOR N/A 09/26/2019   Procedure: TRANSURETHRAL RESECTION OF BLADDER TUMOR (TURBT);  Surgeon: Alexis Frock, MD;  Location[facility];  Service: Urology;  Laterality: N/A;  1 HR   Social History:  reports that he quit smoking about 16 years ago. His smoking use included cigarettes. He has a 15.00 pack-year smoking history. He has never used smokeless tobacco. He reports that he does not drink alcohol and does not use drugs.   Family History  Problem Relation Age of Onset   Heart attack Brother 70       died   Hypertension Mother    Heart attack Father      Allergies  Allergen Reactions   Tramadol Other (See Comments)    Excessive sleepiness/difficulty waking patient     Prior to Admission medications   Medication Sig Start Date End Date Taking? Authorizing Provider  aspirin EC 81 MG tablet Take  81 mg by mouth daily.    [provider]  atorvastatin (LIPITOR) 40 MG tablet Take 1 tablet by mouth once daily 04/25/21   Josue Hector, MD  clopidogrel (PLAVIX) 75 MG tablet Take 1 tablet by mouth once daily 11/26/20   Josue Hector, MD  ELDERBERRY PO Take 1 tablet by mouth daily.     [provider]  ferrous sulfate 325 (65 FE) MG tablet Take 1 tablet (325 mg total) by mouth 2 (two) times daily with a meal. Patient not taking: Reported on 05/19/2021 08/08/19   Orson Eva, MD  nitroGLYCERIN (NITROSTAT) 0.4 MG SL tablet Place 1 tablet (0.4 mg total) under the tongue every 5 (five) minutes x 3 doses as needed for chest pain. 05/04/20   Josue Hector, MD   ondansetron (ZOFRAN) 4 MG tablet Take 8 mg by mouth 2 (two) times daily. 05/16/21   [provider]  pantoprazole (PROTONIX) 40 MG tablet Take 1 tablet (40 mg total) by mouth daily. Take 30 min before meals 09/07/19   Aline August, MD  Probiotic Product (PROBIOTIC PO) Take 1 tablet by mouth daily.    [provider]    Review of Systems:  Constitutional:  No weight loss, night sweats, Fevers, chills, fatigue.  Head&Eyes: No headache.  No vision loss.  No eye pain or scotoma ENT:  No Difficulty swallowing,Tooth/dental problems,Sore throat,  No ear ache, post nasal drip,  Cardio-vascular:  No chest pain, Orthopnea, PND, swelling in lower extremities,  dizziness, palpitations  GI:  No  abdominal pain, nausea, vomiting, diarrhea, loss of appetite, hematochezia, melena, heartburn, indigestion, Resp:  No shortness of breath with exertion or at rest. No cough. No coughing up of blood .No wheezing.No chest wall deformity  Skin:  no rash or lesions.  GU:  no dysuria, change in color of urine, no urgency or frequency. No flank pain.  Musculoskeletal:  No joint pain or swelling. No decreased range of motion. No back pain.  Psych:  No change in mood or affect. No depression or anxiety. Neurologic: No headache, no dysesthesia, no focal weakness, no vision loss. No syncope  Physical Exam: Vitals:   05/29/21 1005 05/29/21 1007 05/29/21 1030 05/29/21 1130  BP: 124/62 112/69 (!) 124/59 118/69  Pulse: 68 67 74 70  Resp: 16 (!) 22 (!) 32 (!) 24  Temp: 99 F (37.2 C)     TempSrc: Oral     SpO2: 100% 98% 100% 93%  Weight:      Height:       General:  A&O x 3, NAD, nontoxic, pleasant/cooperative Head/Eye: No conjunctival hemorrhage, no icterus, [place]/AT, No nystagmus ENT:  No icterus,  No thrush, good dentition, no pharyngeal exudate Neck:  No masses, no lymphadenpathy, no bruits CV:  RRR, no rub, no gallop, no S3 Lung: Bibasilar crackles.  No wheezing Abdomen: soft/NT,  +BS, nondistended, no peritoneal signs Ext: No cyanosis, No rashes, No petechiae, No lymphangitis, 1+LE  edema Neuro: CNII-XII intact, strength 4/5 in bilateral upper and lower extremities, no dysmetria  Labs on Admission:  Basic Metabolic Panel: Recent Labs  Lab 05/29/21 1018  NA 138  K 4.2  CL 96*  CO2 37*  GLUCOSE 171*  BUN 19  CREATININE 0.50*  CALCIUM 8.6*   Liver Function Tests: Recent Labs  Lab 05/29/21 1018  AST 21  ALT 61*  ALKPHOS 69  BILITOT 0.3  PROT 7.4  ALBUMIN 3.7   No results for input(s): LIPASE, AMYLASE in  the last 168 hours. No results for input(s): AMMONIA in the last 168 hours. CBC: Recent Labs  Lab 05/23/21 0121 05/29/21 1018  WBC 11.1* 9.5  NEUTROABS  --  5.8  HGB 8.9* 9.6*  HCT 34.2* 37.9*  MCV 79.5* 85.0  PLT 275 439*   Coagulation Profile: No results for input(s): INR, PROTIME in the last 168 hours. Cardiac Enzymes: No results for input(s): CKTOTAL, CKMB, CKMBINDEX, TROPONINI in the last 168 hours. BNP: Invalid input(s): POCBNP CBG: Recent Labs  Lab 05/26/21 1155 05/26/21 1648 05/26/21 2114 05/27/21 0757 05/27/21 1139  GLUCAP 142* 154* 110* 146* 177*   Urine analysis:    Component Value Date/Time   COLORURINE YELLOW 10/09/2019 0821   APPEARANCEUR CLOUDY (A) 10/09/2019 0821   LABSPEC 1.015 10/09/2019 0821   PHURINE 6.0 10/09/2019 0821   GLUCOSEU NEGATIVE 10/09/2019 0821   HGBUR LARGE (A) 10/09/2019 0821   BILIRUBINUR NEGATIVE 10/09/2019 0821   KETONESUR NEGATIVE 10/09/2019 0821   PROTEINUR 30 (A) 10/09/2019 0821   NITRITE POSITIVE (A) 10/09/2019 0821   LEUKOCYTESUR LARGE (A) 10/09/2019 0821   Sepsis Labs: @LABRCNTIP (procalcitonin:4,lacticidven:4) ) Recent Results (from the past 240 hour(s))  Resp Panel by RT-PCR (Flu A&B, Covid) Nasopharyngeal Swab     Status: None   Collection Time: 05/19/21 12:35 PM   Specimen: Nasopharyngeal Swab; Nasopharyngeal(NP) swabs in vial transport medium  Result Value Ref Range  Status   SARS Coronavirus 2 by RT PCR NEGATIVE NEGATIVE Final    Comment: (NOTE) SARS-CoV-2 target nucleic acids are NOT DETECTED.  The SARS-CoV-2 RNA is generally detectable in upper respiratory specimens during the acute phase of infection. The lowest concentration of SARS-CoV-2 viral copies this assay can detect is 138 copies/mL. A negative result does not preclude SARS-Cov-2 infection and should not be used as the sole basis for treatment or other patient management decisions. A negative result may occur with  improper specimen collection/handling, submission of specimen other than nasopharyngeal swab, presence of viral mutation(s) within the areas targeted by this assay, and inadequate number of viral copies(<138 copies/mL). A negative result must be combined with clinical observations, patient history, and epidemiological information. The expected result is Negative.  Fact Sheet for Patients:  EntrepreneurPulse.com.au  Fact Sheet for Healthcare Providers:  IncredibleEmployment.be  This test is no t yet approved or cleared by the [place] FDA and  has been authorized for detection and/or diagnosis of SARS-CoV-2 by FDA under an Emergency Use Authorization (EUA). This EUA will remain  in effect (meaning this test can be used) for the duration of the COVID-19 declaration under Section 564(b)(1) of the Act, 21 U.S.C.section 360bbb-3(b)(1), unless the authorization is terminated  or revoked sooner.       Influenza A by PCR NEGATIVE NEGATIVE Final   Influenza B by PCR NEGATIVE NEGATIVE Final    Comment: (NOTE) The Xpert Xpress SARS-CoV-2/FLU/RSV plus assay is intended as an aid in the diagnosis of influenza from Nasopharyngeal swab specimens and should not be used as a sole basis for treatment. Nasal washings and aspirates are unacceptable for Xpert Xpress SARS-CoV-2/FLU/RSV testing.  Fact Sheet for  Patients: EntrepreneurPulse.com.au  Fact Sheet for Healthcare Providers: IncredibleEmployment.be  This test is not yet approved or cleared by the [place] FDA and has been authorized for detection and/or diagnosis of SARS-CoV-2 by FDA under an Emergency Use Authorization (EUA). This EUA will remain in effect (meaning this test can be used) for the duration of the COVID-19 declaration under Section 564(b)(1) of the Act, 21  U.S.C. section 360bbb-3(b)(1), unless the authorization is terminated or revoked.  Performed at [facility], [address] [place], [place] [address]   Culture, Respiratory w Gram Stain     Status: None   Collection Time: 05/20/21  6:49 PM   Specimen: Tracheal Aspirate; Respiratory  Result Value Ref Range Status   Specimen Description TRACHEAL ASPIRATE  Final   Special Requests Immunocompromised  Final   Gram Stain   Final    FEW WBC PRESENT, PREDOMINANTLY MONONUCLEAR MODERATE GRAM POSITIVE COCCI RARE GRAM NEGATIVE RODS FEW GRAM POSITIVE RODS    Culture   Final    MODERATE Normal respiratory flora-no Staph aureus or Pseudomonas seen Performed at [facility], [address] [place], [place] [address]    Report Status 05/22/2021 FINAL  Final  Culture, blood (routine x 2)     Status: None   Collection Time: 05/20/21  8:27 PM   Specimen: BLOOD LEFT HAND  Result Value Ref Range Status   Specimen Description BLOOD LEFT HAND  Final   Special Requests   Final    BOTTLES DRAWN AEROBIC AND ANAEROBIC Blood Culture adequate volume   Culture   Final    NO GROWTH 5 DAYS Performed at [facility], [place] [address] [place], [place] [address]    Report Status 05/25/2021 FINAL  Final  Culture, blood (routine x 2)     Status: None   Collection Time: 05/20/21  8:34 PM   Specimen: BLOOD  Result Value Ref Range Status   Specimen Description BLOOD RIGHT ANTECUBITAL  Final   Special Requests   Final    BOTTLES DRAWN  AEROBIC AND ANAEROBIC Blood Culture adequate volume   Culture   Final    NO GROWTH 5 DAYS Performed at [facility], [place] [address] [place], [place] [address]    Report Status 05/25/2021 FINAL  Final  MRSA Next Gen by PCR, Nasal     Status: None   Collection Time: 05/21/21  4:28 AM   Specimen: Nasal Mucosa; Nasal Swab  Result Value Ref Range Status   MRSA by PCR Next Gen NOT DETECTED NOT DETECTED Final    Comment: (NOTE) The GeneXpert MRSA Assay (FDA approved for NASAL specimens only), is one component of a comprehensive MRSA colonization surveillance program. It is not intended to diagnose MRSA infection nor to guide or monitor treatment for MRSA infections. Test performance is not FDA approved in patients less than 50 years old. Performed at [facility], [place] [address], [place], [place] [address]   Resp Panel by RT-PCR (Flu A&B, Covid) Nasopharyngeal Swab     Status: None   Collection Time: 05/29/21 10:43 AM   Specimen: Nasopharyngeal Swab; Nasopharyngeal(NP) swabs in vial transport medium  Result Value Ref Range Status   SARS Coronavirus 2 by RT PCR NEGATIVE NEGATIVE Final    Comment: (NOTE) SARS-CoV-2 target nucleic acids are NOT DETECTED.  The SARS-CoV-2 RNA is generally detectable in upper respiratory specimens during the acute phase of infection. The lowest concentration of SARS-CoV-2 viral copies this assay can detect is 138 copies/mL. A negative result does not preclude SARS-Cov-2 infection and should not be used as the sole basis for treatment or other patient management decisions. A negative result may occur with  improper specimen collection/handling, submission of specimen other than nasopharyngeal swab, presence of viral mutation(s) within the areas targeted by this assay, and inadequate number of viral copies(<138 copies/mL). A negative result must be  combined with clinical observations, patient history, and epidemiological information. The expected  result is Negative.  Fact Sheet for Patients:  EntrepreneurPulse.com.au  Fact Sheet for Healthcare Providers:  IncredibleEmployment.be  This test is no t yet approved or cleared by the [place] FDA and  has been authorized for detection and/or diagnosis of SARS-CoV-2 by FDA under an Emergency Use Authorization (EUA). This EUA will remain  in effect (meaning this test can be used) for the duration of the COVID-19 declaration under Section 564(b)(1) of the Act, 21 U.S.C.section 360bbb-3(b)(1), unless the authorization is terminated  or revoked sooner.       Influenza A by PCR NEGATIVE NEGATIVE Final   Influenza B by PCR NEGATIVE NEGATIVE Final    Comment: (NOTE) The Xpert Xpress SARS-CoV-2/FLU/RSV plus assay is intended as an aid in the diagnosis of influenza from Nasopharyngeal swab specimens and should not be used as a sole basis for treatment. Nasal washings and aspirates are unacceptable for Xpert Xpress SARS-CoV-2/FLU/RSV testing.  Fact Sheet for Patients: EntrepreneurPulse.com.au  Fact Sheet for Healthcare Providers: IncredibleEmployment.be  This test is not yet approved or cleared by the [place] FDA and has been authorized for detection and/or diagnosis of SARS-CoV-2 by FDA under an Emergency Use Authorization (EUA). This EUA will remain in effect (meaning this test can be used) for the duration of the COVID-19 declaration under Section 564(b)(1) of the Act, 21 U.S.C. section 360bbb-3(b)(1), unless the authorization is terminated or revoked.  Performed at [facility], [address], [place], [place] [address]      Radiological Exams on Admission: DG Chest Port 1 View  Result Date: 05/29/2021 CLINICAL DATA:  Low oxygen saturation., weakness. EXAM: PORTABLE CHEST 1 VIEW COMPARISON:  CT examination dated May 21, 2021 FINDINGS: The heart is enlarged. Aortic arch atherosclerotic  calcifications. Low lung volumes. No focal consolidation. Chronic interstitial markings. IMPRESSION: 1. Low lung volumes without evidence of focal consolidation or large pleural effusion. 2.  Cardiomegaly. Electronically Signed   By: Keane Police D.O.   On: 05/29/2021 10:22    EKG: Independently reviewed. Sinus, RBBB    Time spent:60 minutes Code Status:   FULL Family Communication:  daughter at bedside Disposition Plan: expect 2-3 day hospitalization Consults called: none DVT Prophylaxis: [place] Lovenox  Orson Eva, DO  [facility] Pager [phone]  If 7PM-7AM, please contact night-coverage [email or web address] Password [facility] 05/29/2021, 12:23 PM

## 2021-05-29 NOTE — Plan of Care (Signed)

## 2021-05-30 ENCOUNTER — Ambulatory Visit: Payer: Medicare HMO | Admitting: Cardiovascular Disease

## 2021-05-30 DIAGNOSIS — J9622 Acute and chronic respiratory failure with hypercapnia: Secondary | ICD-10-CM

## 2021-05-30 DIAGNOSIS — G14 Postpolio syndrome: Secondary | ICD-10-CM

## 2021-05-30 DIAGNOSIS — J9621 Acute and chronic respiratory failure with hypoxia: Principal | ICD-10-CM

## 2021-05-30 LAB — COMPREHENSIVE METABOLIC PANEL
ALT: 48 U/L — ABNORMAL HIGH (ref 0–44)
AST: 20 U/L (ref 15–41)
Albumin: 3.4 g/dL — ABNORMAL LOW (ref 3.5–5.0)
Alkaline Phosphatase: 60 U/L (ref 38–126)
Anion gap: 8 (ref 5–15)
BUN: 21 mg/dL (ref 8–23)
CO2: 38 mmol/L — ABNORMAL HIGH (ref 22–32)
Calcium: 8.8 mg/dL — ABNORMAL LOW (ref 8.9–10.3)
Chloride: 94 mmol/L — ABNORMAL LOW (ref 98–111)
Creatinine, Ser: 0.55 mg/dL — ABNORMAL LOW (ref 0.61–1.24)
GFR, Estimated: >60 mL/min (ref >60)
Glucose, Bld: 123 mg/dL — ABNORMAL HIGH (ref 70–99)
Potassium: 3.8 mmol/L (ref 3.5–5.1)
Sodium: 140 mmol/L (ref 135–145)
Total Bilirubin: 0.6 mg/dL (ref 0.3–1.2)
Total Protein: 6.7 g/dL (ref 6.5–8.1)

## 2021-05-30 LAB — CBC
HCT: 33.6 % — ABNORMAL LOW (ref 39.0–52.0)
Hemoglobin: 8.8 g/dL — ABNORMAL LOW (ref 13.0–17.0)
MCH: 21.9 pg — ABNORMAL LOW (ref 26.0–34.0)
MCHC: 26.2 g/dL — ABNORMAL LOW (ref 30.0–36.0)
MCV: 83.8 fL (ref 80.0–100.0)
Platelets: 443 10*3/uL — ABNORMAL HIGH (ref 150–400)
RBC: 4.01 MIL/uL — ABNORMAL LOW (ref 4.22–5.81)
RDW: 18.2 % — ABNORMAL HIGH (ref 11.5–15.5)
WBC: 11.1 10*3/uL — ABNORMAL HIGH (ref 4.0–10.5)
nRBC: 0.2 % (ref 0.0–0.2)

## 2021-05-30 LAB — URINE CULTURE: Culture: NO GROWTH

## 2021-05-30 LAB — MRSA NEXT GEN BY PCR, NASAL: MRSA by PCR Next Gen: NOT DETECTED

## 2021-05-30 NOTE — Plan of Care (Signed)
°  Problem: Acute Rehab PT Goals(only PT should resolve) Goal: Pt Will Go Supine/Side To Sit Outcome: Progressing Flowsheets (Taken 05/30/2021 1223) Pt will go Supine/Side to Sit: with modified independence Goal: Patient Will Transfer Sit To/From Stand Outcome: Progressing Flowsheets (Taken 05/30/2021 1223) Patient will transfer sit to/from stand: with supervision Goal: Pt Will Ambulate Outcome: Progressing Flowsheets (Taken 05/30/2021 1223) Pt will Ambulate:  > 125 feet  with supervision  with least restrictive assistive device Goal: Pt/caregiver will Perform Home Exercise Program Outcome: Progressing Flowsheets (Taken 05/30/2021 1223) Pt/caregiver will Perform Home Exercise Program:  For increased ROM  For increased strengthening  For improved balance  Independently   Tori Arletta Lumadue PT, DPT 05/30/21, 12:23 PM

## 2021-05-30 NOTE — Progress Notes (Addendum)
PROGRESS NOTE  [name] ENI:[id number] DOB: [birth year] DOA: 05/29/2021 PCP: Celene Squibb, MD  Brief History:  80 y.o. male with medical history of coronary artery disease with recent NSTEMI December 2022, gastric AVMs, bladder cancer s/p TURBT, hypertension, hyperlipidemia, CAD status post PCI to the LAD 2009 presenting from [place] with somnolence and hypoxia.  The patient had a recent hospitalization from 05/20/2019 to 05/27/2021 when he was treated for NSTEMI.  Patient underwent heart catheterization on 05/20/2021 which showed patent LAD stent and moderate mid LAD stenosis of 50% with minimal diffuse disease elsewhere.  Post heart catheterization, the patient became unresponsive and he required intubation and was admitted to the ICU for acute hypoxic and hypercarbic respiratory failure.  It was felt that the patient likely had an underlying sleep disorder that likely contributed to his respiratory failure.  His hospitalization was prolonged by the development of pneumonia for which he completed 5 days of cefepime.  He had a CTA of the chest was negative for PE.  MRI was recommended, but the patient refused because he was claustrophobic.  He was discharged to SNF for 2 L nasal cannula. According to the patient's daughter at the bedside, she stated that he was not on any supplemental oxygen while she visited him on 05/28/2021.  Apparently he participated in physical therapy on that day and developed hypoxia down into the 60s with exertion.  Once he was rested his saturation came back up to the 90s.  Nevertheless, his therapy was aborted because of his hypoxia.  When his family came to visit him again on 05/30/2019 2 in the morning, the patient was noted to be somnolent sitting up in chair.  His oxygen saturation was checked, and it was in the 70s.  As result, the patient was brought to emergency department for further evaluation. At the time of my evaluation, the patient is on BiPAP, but he  is awake and answers simple questions appropriately.  He does not recall the events of earlier in the day, but he denies any fevers, chills, headache, chest pain, shortness of breath, nausea, vomiting, diarrhea, abd pain.  Notably, the patient was not discharged to [facility] with any hypnotic or opioid medications In retrospect, the patient's daughter states that the patient has had daytime hypersomnolence "for at least a few months" given prior to his hospitalization for NSTEMI. ED In the emergency department, the patient had low-grade temperature 99.0 F.  He was hemodynamically stable with oxygen saturation of 95% FiO2 30%.  The patient did desaturate into the 80s initially on room air prior to BiPAP.  BMP showed sodium 138, potassium 4.2, bicarbonate 37, serum creatinine 0.50.  LFTs were unremarkable.  WBC 9.5, hemoglobin 9.6, platelets 139,000.  Troponin 37.  BNP 188.  Chest x-ray showed low volumes without any consolidation. VBG showed pH 7.2 2/96/51/on 2 L  Assessment/Plan: Acute on chronic respiratory failure with hypoxia and hypercarbia -Given the patient's history of daytime hypersomnolence and " difficulty waking up after surgery", I suspect the patient has an underlying sleep disorder -Weaned off BiPAP  -Patient was discharged from [facility] on 05/27/2021 with 2 L nasal cannula -Patient appears to be slightly on the hypervolemic side>> given Lasix x1 IV on 12/18 -TSH--0.742 -Minimize any hypnotic or sedative medications -now on 3L -pulmonary consult   [name] has chronic hypoxic and hypercapnic respiratory failure.  Despite aggressive treatment , patient continues to exhibit signs of significant  hypercapnia associated with chronic respiratory failure secondary to COPD and underlying sleep disorder  Patient requires the use of NIV both nightly and daytime to help with exacerbation periods.  -On admission patient had uncompensated respiratory acidosis with severe hypercapnia -VBG  on admission was-- PH 7.227, pCo2 was 96.7, oxygen sats was 96 % , Po2 was 51.9 on 28 % Fio2   - The use of the NIV will treat patient's high PCO2 levels (please see ABG results on Bipap), and use of NIV can reduce risk of exacerbation in future hospitalizations when used at night and during the day.   Patient will need these advanced settings in conjunction with the current medication regimen and aggressive pulmonary treatment: BiPAP is not an option due to his functional limitations and the severity of the patient's condition.    Failure to have NIV available for use could lead to death.   patient had significant episodes of severe lethargy and unresponsiveness due to very very high CO2 levels    ABG on  date 05/29/21   at time 1429 am done on BIPAP with Fi02 of 30% Arterial Blood Gas result:  pO2 73 ; pCO2 78,  pH 7.311,  O2 Sat 96 %   Acute metabolic encephalopathy -Secondary to hypercapnic respiratory failure -TSH--0.742 -B12--449 -Ammonia--36 -UA--no pyuria -12/19--more alert   Coronary artery disease -No chest pain presently -Recent NSTEMI 05/19/2021 -Heart catheterization 05/21/2019.  As discussed above -History of PCI to LAD 2009 -Continue aspirin Plavix -EKG without concerning ischemic change -05/11/2021 echo EF 65-70%, no WMA, moderate AS, increased LVEDP   Hyperlipidemia -Continue statin   Right lung nodule -Outpatient surveillance   Iron deficiency anemia -Continue iron supplementation   GERD -Continue Protonix  Deconditioning -PT eval  Hyperammonemia -ammonia = 36 -due to chronic hepatic congestion -mentation improving   Status is: Inpatient  Remains inpatient appropriate because: severity of illness requiring BiPAP, pulmonary consultation        Family Communication:   spouse updated at bedside 12/19  Consultants:  pulm  Code Status:  FULL   DVT Prophylaxis:  [place] Lovenox   Procedures: As Listed in Progress Note  Above  Antibiotics: None       Subjective: Patient denies fevers, chills, headache, chest pain, dyspnea, nausea, vomiting, diarrhea, abdominal pain, dysuria, hematuria, hematochezia, and melena.   Objective: Vitals:   05/30/21 0300 05/30/21 0400 05/30/21 0432 05/30/21 0500  BP: (!) 123/55 (!) 113/50  (!) 102/35  Pulse: 66 67  81  Resp:      Temp:   98.6 F (37 C)   TempSrc:   Oral   SpO2: 96% 97%  98%  Weight:      Height:        Intake/Output Summary (Last 24 hours) at 05/30/2021 0824 Last data filed at 05/29/2021 2300 Gross per 24 hour  Intake 250 ml  Output 1200 ml  Net -950 ml   Weight change:  Exam:  General:  Pt is alert, follows commands appropriately, not in acute distress HEENT: No icterus, No thrush, No neck mass, [place]/AT Cardiovascular: RRR, S1/S2, no rubs, no gallops Respiratory: bibasilar crackles. No wheeze Abdomen: Soft/+BS, non tender, non distended, no guarding Extremities: trace LE edema, No lymphangitis, No petechiae, No rashes, no synovitis   Data Reviewed: I have personally reviewed following labs and imaging studies Basic Metabolic Panel: Recent Labs  Lab 05/29/21 1018 05/30/21 0440  NA 138 140  K 4.2 3.8  CL 96* 94*  CO2 37* 38*  GLUCOSE 171* 123*  BUN 19 21  CREATININE 0.50* 0.55*  CALCIUM 8.6* 8.8*   Liver Function Tests: Recent Labs  Lab 05/29/21 1018 05/30/21 0440  AST 21 20  ALT 61* 48*  ALKPHOS 69 60  BILITOT 0.3 0.6  PROT 7.4 6.7  ALBUMIN 3.7 3.4*   No results for input(s): LIPASE, AMYLASE in the last 168 hours. Recent Labs  Lab 05/29/21 1414  AMMONIA 36*   Coagulation Profile: No results for input(s): INR, PROTIME in the last 168 hours. CBC: Recent Labs  Lab 05/29/21 1018 05/30/21 0440  WBC 9.5 11.1*  NEUTROABS 5.8  --   HGB 9.6* 8.8*  HCT 37.9* 33.6*  MCV 85.0 83.8  PLT 439* 443*   Cardiac Enzymes: No results for input(s): CKTOTAL, CKMB, CKMBINDEX, TROPONINI in the last 168  hours. BNP: Invalid input(s): POCBNP CBG: Recent Labs  Lab 05/26/21 1155 05/26/21 1648 05/26/21 2114 05/27/21 0757 05/27/21 1139  GLUCAP 142* 154* 110* 146* 177*   HbA1C: Recent Labs    05/29/21 1414  HGBA1C 6.3*   Urine analysis:    Component Value Date/Time   COLORURINE YELLOW 05/29/2021 [place] 05/29/2021 1505   LABSPEC 1.015 05/29/2021 1505   PHURINE 6.0 05/29/2021 1505   GLUCOSEU NEGATIVE 05/29/2021 1505   HGBUR NEGATIVE 05/29/2021 1505   BILIRUBINUR NEGATIVE 05/29/2021 1505   KETONESUR NEGATIVE 05/29/2021 1505   PROTEINUR NEGATIVE 05/29/2021 1505   NITRITE NEGATIVE 05/29/2021 1505   LEUKOCYTESUR NEGATIVE 05/29/2021 1505   Sepsis Labs: @LABRCNTIP (procalcitonin:4,lacticidven:4) ) Recent Results (from the past 240 hour(s))  Culture, Respiratory w Gram Stain     Status: None   Collection Time: 05/20/21  6:49 PM   Specimen: Tracheal Aspirate; Respiratory  Result Value Ref Range Status   Specimen Description TRACHEAL ASPIRATE  Final   Special Requests Immunocompromised  Final   Gram Stain   Final    FEW WBC PRESENT, PREDOMINANTLY MONONUCLEAR MODERATE GRAM POSITIVE COCCI RARE GRAM NEGATIVE RODS FEW GRAM POSITIVE RODS    Culture   Final    MODERATE Normal respiratory flora-no Staph aureus or Pseudomonas seen Performed at [facility], [address], [place], [place] [address]    Report Status 05/22/2021 FINAL  Final  Culture, blood (routine x 2)     Status: None   Collection Time: 05/20/21  8:27 PM   Specimen: BLOOD LEFT HAND  Result Value Ref Range Status   Specimen Description BLOOD LEFT HAND  Final   Special Requests   Final    BOTTLES DRAWN AEROBIC AND ANAEROBIC Blood Culture adequate volume   Culture   Final    NO GROWTH 5 DAYS Performed at [facility], [place] [address], [place], [place] [address]    Report Status 05/25/2021 FINAL  Final  Culture, blood (routine x 2)     Status: None   Collection Time: 05/20/21  8:34  PM   Specimen: BLOOD  Result Value Ref Range Status   Specimen Description BLOOD RIGHT ANTECUBITAL  Final   Special Requests   Final    BOTTLES DRAWN AEROBIC AND ANAEROBIC Blood Culture adequate volume   Culture   Final    NO GROWTH 5 DAYS Performed at [facility], [place] [address] [place], [place] [address]    Report Status 05/25/2021 FINAL  Final  MRSA Next Gen by PCR, Nasal     Status: None   Collection Time: 05/21/21  4:28 AM   Specimen: Nasal Mucosa;  Nasal Swab  Result Value Ref Range Status   MRSA by PCR Next Gen NOT DETECTED NOT DETECTED Final    Comment: (NOTE) The GeneXpert MRSA Assay (FDA approved for NASAL specimens only), is one component of a comprehensive MRSA colonization surveillance program. It is not intended to diagnose MRSA infection nor to guide or monitor treatment for MRSA infections. Test performance is not FDA approved in patients less than 40 years old. Performed at [facility], [place] [address], [place], [place] [address]   Resp Panel by RT-PCR (Flu A&B, Covid) Nasopharyngeal Swab     Status: None   Collection Time: 05/29/21 10:43 AM   Specimen: Nasopharyngeal Swab; Nasopharyngeal(NP) swabs in vial transport medium  Result Value Ref Range Status   SARS Coronavirus 2 by RT PCR NEGATIVE NEGATIVE Final    Comment: (NOTE) SARS-CoV-2 target nucleic acids are NOT DETECTED.  The SARS-CoV-2 RNA is generally detectable in upper respiratory specimens during the acute phase of infection. The lowest concentration of SARS-CoV-2 viral copies this assay can detect is 138 copies/mL. A negative result does not preclude SARS-Cov-2 infection and should not be used as the sole basis for treatment or other patient management decisions. A negative result may occur with  improper specimen collection/handling, submission of specimen other than nasopharyngeal swab, presence of viral mutation(s) within the areas targeted by this assay, and inadequate number of  viral copies(<138 copies/mL). A negative result must be combined with clinical observations, patient history, and epidemiological information. The expected result is Negative.  Fact Sheet for Patients:  EntrepreneurPulse.com.au  Fact Sheet for Healthcare Providers:  IncredibleEmployment.be  This test is no t yet approved or cleared by the [place] FDA and  has been authorized for detection and/or diagnosis of SARS-CoV-2 by FDA under an Emergency Use Authorization (EUA). This EUA will remain  in effect (meaning this test can be used) for the duration of the COVID-19 declaration under Section 564(b)(1) of the Act, 21 U.S.C.section 360bbb-3(b)(1), unless the authorization is terminated  or revoked sooner.       Influenza A by PCR NEGATIVE NEGATIVE Final   Influenza B by PCR NEGATIVE NEGATIVE Final    Comment: (NOTE) The Xpert Xpress SARS-CoV-2/FLU/RSV plus assay is intended as an aid in the diagnosis of influenza from Nasopharyngeal swab specimens and should not be used as a sole basis for treatment. Nasal washings and aspirates are unacceptable for Xpert Xpress SARS-CoV-2/FLU/RSV testing.  Fact Sheet for Patients: EntrepreneurPulse.com.au  Fact Sheet for Healthcare Providers: IncredibleEmployment.be  This test is not yet approved or cleared by the [place] FDA and has been authorized for detection and/or diagnosis of SARS-CoV-2 by FDA under an Emergency Use Authorization (EUA). This EUA will remain in effect (meaning this test can be used) for the duration of the COVID-19 declaration under Section 564(b)(1) of the Act, 21 U.S.C. section 360bbb-3(b)(1), unless the authorization is terminated or revoked.  Performed at [facility], [address], [place], [place] [address]   MRSA Next Gen by PCR, Nasal     Status: None   Collection Time: 05/29/21  7:45 PM   Specimen: Nasal Mucosa; Nasal Swab   Result Value Ref Range Status   MRSA by PCR Next Gen NOT DETECTED NOT DETECTED Final    Comment: (NOTE) The GeneXpert MRSA Assay (FDA approved for NASAL specimens only), is one component of a comprehensive MRSA colonization surveillance program. It is not intended to diagnose MRSA infection nor to guide or monitor treatment for MRSA  infections. Test performance is not FDA approved in patients less than 31 years old. Performed at [facility], [address], [place], [place] [address]      Scheduled Meds:  aspirin EC  81 mg Oral Daily   atorvastatin  40 mg Oral Daily   Chlorhexidine Gluconate Cloth  6 each Topical Q0600   clopidogrel  75 mg Oral Daily   enoxaparin (LOVENOX) injection  40 mg Subcutaneous Q24H   pantoprazole  40 mg Oral Daily   Continuous Infusions:  Procedures/Studies: CT ABDOMEN PELVIS WO CONTRAST  Result Date: 05/20/2021 CLINICAL DATA:  Status post cardiac catheterization this morning, evaluate for retroperitoneal bleed EXAM: CT ABDOMEN AND PELVIS WITHOUT CONTRAST TECHNIQUE: Multidetector CT imaging of the abdomen and pelvis was performed following the standard protocol without IV contrast. COMPARISON:  05/19/2021 FINDINGS: Lower chest: Aortic valve calcifications. Coronary artery calcifications. Small bilateral pleural effusions, increased compared to prior examination. Hepatobiliary: No focal liver abnormality is seen. Status post cholecystectomy. No biliary dilatation. Pancreas: Unremarkable. No pancreatic ductal dilatation or surrounding inflammatory changes. Spleen: Normal in size without significant abnormality. Adrenals/Urinary Tract: Adrenal glands are unremarkable. Excreted contrast in the collecting systems and urinary bladder, in keeping with same-day coronary catheterization. Kidneys are otherwise normal, without renal calculi, solid lesion, or hydronephrosis. Bladder is unremarkable. Stomach/Bowel: Stomach is within normal limits. Appendix appears normal. No  evidence of bowel wall thickening, distention, or inflammatory changes. Vascular/Lymphatic: Aortic atherosclerosis. Status post graft repair of the abdominal aorta. No enlarged abdominal or pelvic lymph nodes. Reproductive: No mass or other significant abnormality. Other: No abdominal wall hernia or abnormality. Subcutaneous soft tissue stranding and pressure dressing overlying the right femoral vessels (series 3, image 89). No abdominopelvic ascites. Musculoskeletal: No acute or significant osseous findings. Chronic pars defects of L5. IMPRESSION: 1. No evidence of retroperitoneal hematoma or other acute noncontrast CT findings in the abdomen or pelvis. 2. Subcutaneous soft tissue stranding and pressure dressing overlying the right femoral vessels, in keeping with catheterization. 3. Small bilateral pleural effusions, increased compared to prior examination. 4. Status post graft repair of the abdominal aorta. Aortic Atherosclerosis (ICD10-I70.0). Electronically Signed   By: Delanna Ahmadi M.D.   On: 05/20/2021 11:34   DG Chest 1 View  Result Date: 05/19/2021 CLINICAL DATA:  Shortness of breath. EXAM: CHEST  1 VIEW COMPARISON:  Chest x-ray from same day at 1222 hours. FINDINGS: Stable cardiomegaly. Unchanged bibasilar atelectasis/scarring. Linear density overlying the peripheral right lung corresponds to a prominent lung marking on CT from same day. No focal consolidation, pleural effusion, or pneumothorax. No acute osseous abnormality. IMPRESSION: 1. Linear density overlying the peripheral right lung corresponds to a prominent lung marking. No pneumothorax. Electronically Signed   By: Titus Dubin M.D.   On: 05/19/2021 14:41   DG Abd 1 View  Result Date: 05/20/2021 CLINICAL DATA:  OG tube placement. EXAM: ABDOMEN - 1 VIEW COMPARISON:  10/14/2019 FINDINGS: 1153 hours. OG tube tip is at the esophagogastric junction with proximal side port in the distal esophagus. Diffuse gaseous distention of small bowel  and colon evident. IMPRESSION: OG tube tip is at the esophagogastric junction with proximal side port in the distal esophagus. Tube could be advanced 9-10 cm to place the proximal side port below the GE junction. Electronically Signed   By: Misty Stanley M.D.   On: 05/20/2021 12:04   CT Angio Chest Pulmonary Embolism (PE) W or WO Contrast  Result Date: 05/21/2021 CLINICAL DATA:  High probability for pulmonary embolism.  The  Pneumonia EXAM: CT ANGIOGRAPHY CHEST WITH CONTRAST TECHNIQUE: Multidetector CT imaging of the chest was performed using the standard protocol during bolus administration of intravenous contrast. Multiplanar CT image reconstructions and MIPs were obtained to evaluate the vascular anatomy. CONTRAST:  124mL OMNIPAQUE IOHEXOL 350 MG/ML SOLN COMPARISON:  None. FINDINGS: Cardiovascular: The heart is mildly enlarged. Coronary artery calcifications are noted. No pulmonary artery emboli. Mediastinum/Nodes: No enlarged mediastinal, hilar, or axillary lymph nodes. Thyroid gland, trachea, and esophagus demonstrate no significant findings. ET tube in appropriate position. NG tube terminates in the proximal stomach. Lungs/Pleura: Trace bilateral pleural effusions with mild adjacent atelectasis. No focal airspace opacity to indicate pneumonia. 4 mm nodule seen in the right upper lobe (46/6). Upper Abdomen: No acute abnormality. Musculoskeletal: No chest wall abnormality. No acute or significant osseous findings. Review of the MIP images confirms the above findings. IMPRESSION: 1. No pulmonary artery embolism. 2. Trace bilateral pleural effusions with mild adjacent atelectasis. 3. 4 mm right upper lobe noncalcified pulmonary nodule (46/6). No follow-up needed if patient is low-risk. Non-contrast chest CT can be considered in 12 months if patient is high-risk. This recommendation follows the consensus statement: Guidelines for Management of Incidental Pulmonary Nodules Detected on CT Images: From the  Fleischner Society 2017; Radiology 2017; 284:228-243. Electronically Signed   By: Miachel Roux M.D.   On: 05/21/2021 09:56   CT ABDOMEN PELVIS W CONTRAST  Result Date: 05/19/2021 CLINICAL DATA:  Acute generalized abdominal pain. EXAM: CT ABDOMEN AND PELVIS WITH CONTRAST TECHNIQUE: Multidetector CT imaging of the abdomen and pelvis was performed using the standard protocol following bolus administration of intravenous contrast. CONTRAST:  189mL OMNIPAQUE IOHEXOL 300 MG/ML  SOLN COMPARISON:  September 23, 2019. FINDINGS: Lower chest: Mild bibasilar subsegmental atelectasis is noted. Hepatobiliary: No focal liver abnormality is seen. Status post cholecystectomy. No biliary dilatation. Pancreas: Unremarkable. No pancreatic ductal dilatation or surrounding inflammatory changes. Spleen: Normal in size without focal abnormality. Adrenals/Urinary Tract: Adrenal glands appear normal. Right renal cyst is noted. No hydronephrosis or renal obstruction is noted. No renal or ureteral calculi are noted. Urinary bladder is unremarkable. Stomach/Bowel: Stomach is within normal limits. Appendix appears normal. No evidence of bowel wall thickening, distention, or inflammatory changes. Vascular/Lymphatic: Status post surgical repair of abdominal aorta. No adenopathy is noted. Reproductive: Prostate is unremarkable. Other: No abdominal wall hernia or abnormality. No abdominopelvic ascites. Musculoskeletal: Minimal grade 1 anterolisthesis is noted secondary to bilateral L5 spondylolysis. IMPRESSION: No acute abnormality seen in the abdomen or pelvis. Aortic Atherosclerosis (ICD10-I70.0). Electronically Signed   By: Marijo Conception M.D.   On: 05/19/2021 13:33   CARDIAC CATHETERIZATION  Result Date: 05/20/2021   Ost LAD to Prox LAD lesion is 40% stenosed.  Prox LAD to Mid LAD overlapping stent segment is mostly widely patent with 5% in-stent stenosis   Mid LAD lesion is 50% stenosed.   [place]amus lesion is 40% stenosed.   Mild to moderate  diffuse disease elsewhere.  Codominant system.   LV end diastolic pressure is mildly elevated.   There is moderate aortic valve stenosis. SUMMARY Stable single-vessel disease with widely patent LAD stents and moderate mid LAD 50% stenosis. No obvious culprit lesion to explain non-STEMI. Mildly elevated LVEDP with normal EF by echo. Moderate aortic stenosis by echo with mean gradient of roughly 20 mmHg by cath. RECOMMENDATIONS Suspect demand ischemia with ongoing illness Continue medical management. Okay to discontinue heparin Glenetta Hew, MD  DG Chest [facility] 1 View  Result Date: 05/29/2021 CLINICAL DATA:  Low  oxygen saturation., weakness. EXAM: PORTABLE CHEST 1 VIEW COMPARISON:  CT examination dated May 21, 2021 FINDINGS: The heart is enlarged. Aortic arch atherosclerotic calcifications. Low lung volumes. No focal consolidation. Chronic interstitial markings. IMPRESSION: 1. Low lung volumes without evidence of focal consolidation or large pleural effusion. 2.  Cardiomegaly. Electronically Signed   By: Keane Police D.O.   On: 05/29/2021 10:22   DG Chest Port 1 View  Result Date: 05/21/2021 CLINICAL DATA:  Pneumonia. EXAM: PORTABLE CHEST 1 VIEW COMPARISON:  May 20, 2021. FINDINGS: Stable cardiomediastinal silhouette. Endotracheal and nasogastric tubes are unchanged. Stable bibasilar subsegmental atelectasis is noted. Bony thorax is unremarkable. IMPRESSION: Stable support apparatus. Stable bibasilar subsegmental atelectasis. Electronically Signed   By: Marijo Conception M.D.   On: 05/21/2021 09:28   DG CHEST PORT 1 VIEW  Result Date: 05/20/2021 CLINICAL DATA:  Endotracheal tube placement. EXAM: PORTABLE CHEST 1 VIEW COMPARISON:  Same day. FINDINGS: Stable cardiomegaly. Endotracheal and nasogastric tubes are unchanged in position. Stable bilateral lung opacities are noted concerning for atelectasis or possibly infiltrates. Bony thorax is unremarkable. IMPRESSION: Stable support apparatus.  Stable  bibasilar opacities. Electronically Signed   By: Marijo Conception M.D.   On: 05/20/2021 12:05   DG CHEST PORT 1 VIEW  Result Date: 05/20/2021 CLINICAL DATA:  80 year old male with history of non ST elevation myocardial infarction. EXAM: PORTABLE CHEST 1 VIEW COMPARISON:  Chest x-ray 05/19/2021. FINDINGS: An endotracheal tube is in place with tip 3.2 cm above the carina. Transcutaneous defibrillator pad projecting over the mid thorax. Lung volumes are low. Bibasilar opacities (left greater than right) may reflect areas of atelectasis and/or consolidation. Small left pleural effusion. No definite right pleural effusion. No pneumothorax. Nodular density projecting over the right upper lobe, not readily apparent on the recent prior examination, potentially artifact, measuring 1.4 cm in diameter. Mild diffuse interstitial prominence and cephalization of the pulmonary vasculature, suggesting a background of mild interstitial pulmonary edema. Heart size is mildly enlarged. The patient is rotated to the left on today's exam, resulting in distortion of the mediastinal contours and reduced diagnostic sensitivity and specificity for mediastinal pathology. Atherosclerotic calcifications in the thoracic aorta. IMPRESSION: 1. Support apparatus, as above. 2. The appearance the chest suggests mild congestive heart failure, as above. 3. Bibasilar opacities which may reflect areas of atelectasis and/or consolidation. 4. New nodular density projecting over the region of the right upper lobe, potentially artifactual. Close attention on follow-up studies is recommended to ensure resolution of this finding. 5. Aortic atherosclerosis. Electronically Signed   By: Vinnie Langton M.D.   On: 05/20/2021 10:58   DG Chest Portable 1 View  Result Date: 05/19/2021 CLINICAL DATA:  Shortness of breath EXAM: PORTABLE CHEST 1 VIEW COMPARISON:  Previous studies including the examination of 10/15/2019 FINDINGS: Transverse diameter of heart is  increased. There are no signs of pulmonary edema. There are linear densities in medial left lower lung fields. Left lateral CP angle is indistinct. There is no definite pneumothorax. Faint linear density seen in the lateral aspect of right lower lung fields may suggest pleural thickening. Less likely possibility would be loculated right pneumothorax. IMPRESSION: Cardiomegaly. Increased markings in the medial lower lung fields may suggest scarring or subsegmental atelectasis. There are no new focal infiltrates. There is faint linear radiopacity in the lateral aspect of right lower lung fields which may be due to pleural thickening or suggest small loculated right pneumothorax. There is no demonstrable right apical pneumothorax. Repeat PA views of chest in  the inspiration and expiration may be considered. Electronically Signed   By: Elmer Picker M.D.   On: 05/19/2021 12:42   ECHOCARDIOGRAM COMPLETE  Result Date: 05/11/2021    ECHOCARDIOGRAM REPORT   Patient Name:   DEFOREST MAIDEN Date of Exam: 05/11/2021 Medical Rec #:  [id number]       Height:       66.0 in Accession #:    [id number]      Weight:       197.0 lb Date of Birth:  18-Mar-1941      BSA:          1.987 m Patient Age:    19 years        BP:           159/80 mmHg Patient Gender: M               HR:           90 bpm. Exam Location:  [facility] Procedure: 2D Echo, Cardiac Doppler and Color Doppler Indications:    I35.0 (ICD-10-CM) - Nonrheumatic aortic valve stenosis  History:        Patient has prior history of Echocardiogram examinations, most                 recent 05/14/2020. CAD, Aortic Valve Disease,                 Signs/Symptoms:Murmur; Risk Factors:Hypertension and                 Dyslipidemia. GERD.  Sonographer:    Alvino Chapel RCS Referring Phys: [place]  1. Left ventricular ejection fraction, by estimation, is 65 to 70%. The left ventricle has normal function. The left ventricle has no regional wall motion  abnormalities. Left ventricular diastolic parameters are consistent with Grade I diastolic dysfunction (impaired relaxation). Elevated left ventricular end-diastolic pressure.  2. Right ventricular systolic function is normal. The right ventricular size is mildly enlarged. There is normal pulmonary artery systolic pressure. The estimated right ventricular systolic pressure is 72.0 mmHg.  3. Left atrial size was mildly dilated.  4. The mitral valve is abnormal. Trivial mitral valve regurgitation. Moderate mitral annular calcification.  5. The aortic valve is tricuspid. There is severe calcifcation of the aortic valve. Aortic valve regurgitation is trivial. Moderate aortic valve stenosis. Aortic valve mean gradient measures 21.0 mmHg. Dimentionless index 21 mmHg.  6. The inferior vena cava is normal in size with greater than 50% respiratory variability, suggesting right atrial pressure of 3 mmHg. Comparison(s): Prior images reviewed side by side. Aortic stenosis is in moderate range with mean gradient 21 mmHg (previoiusly 18 mmHg). FINDINGS  Left Ventricle: Left ventricular ejection fraction, by estimation, is 65 to 70%. The left ventricle has normal function. The left ventricle has no regional wall motion abnormalities. The left ventricular internal cavity size was normal in size. There is  borderline left ventricular hypertrophy. Left ventricular diastolic parameters are consistent with Grade I diastolic dysfunction (impaired relaxation). Elevated left ventricular end-diastolic pressure. Right Ventricle: The right ventricular size is mildly enlarged. No increase in right ventricular wall thickness. Right ventricular systolic function is normal. There is normal pulmonary artery systolic pressure. The tricuspid regurgitant velocity is 2.46  m/s, and with an assumed right atrial pressure of 3 mmHg, the estimated right ventricular systolic pressure is 94.7 mmHg. Left Atrium: Left atrial size was mildly dilated. Right  Atrium: Right atrial size was normal in size. Pericardium:  There is no evidence of pericardial effusion. Mitral Valve: The mitral valve is abnormal. Moderate mitral annular calcification. Trivial mitral valve regurgitation. Tricuspid Valve: The tricuspid valve is grossly normal. Tricuspid valve regurgitation is trivial. Aortic Valve: The aortic valve is tricuspid. There is severe calcifcation of the aortic valve. There is mild to moderate aortic valve annular calcification. Aortic valve regurgitation is trivial. Moderate aortic stenosis is present. Aortic valve mean gradient measures 21.0 mmHg. Aortic valve peak gradient measures 39.4 mmHg. Aortic valve area, by VTI measures 1.41 cm. Pulmonic Valve: The pulmonic valve was grossly normal. Pulmonic valve regurgitation is trivial. Aorta: The aortic root is normal in size and structure. Venous: The inferior vena cava is normal in size with greater than 50% respiratory variability, suggesting right atrial pressure of 3 mmHg. IAS/Shunts: No atrial level shunt detected by color flow Doppler.  LEFT VENTRICLE PLAX 2D LVIDd:         5.20 cm   Diastology LVIDs:         2.80 cm   LV e' medial:    5.22 cm/s LV PW:         1.00 cm   LV E/e' medial:  21.6 LV IVS:        1.00 cm   LV e' lateral:   6.96 cm/s LVOT diam:     2.00 cm   LV E/e' lateral: 16.2 LV SV:         84 LV SV Index:   42 LVOT Area:     3.14 cm  RIGHT VENTRICLE RV S prime:     17.40 cm/s TAPSE (M-mode): 2.2 cm LEFT ATRIUM             Index LA diam:        5.30 cm 2.67 cm/m LA Vol (A2C):   51.3 ml 25.82 ml/m LA Vol (A4C):   77.0 ml 38.75 ml/m LA Biplane Vol: 67.8 ml 34.12 ml/m  AORTIC VALVE AV Area (Vmax):    1.26 cm AV Area (Vmean):   1.34 cm AV Area (VTI):     1.41 cm AV Vmax:           314.00 cm/s AV Vmean:          207.000 cm/s AV VTI:            0.595 m AV Peak Grad:      39.4 mmHg AV Mean Grad:      21.0 mmHg LVOT Vmax:         126.00 cm/s LVOT Vmean:        88.500 cm/s LVOT VTI:          0.267 m  LVOT/AV VTI ratio: 0.45  AORTA Ao Root diam: 3.50 cm MITRAL VALVE                TRICUSPID VALVE MV Area (PHT): 2.37 cm     TR Peak grad:   24.2 mmHg MV Decel Time: 320 msec     TR Vmax:        246.00 cm/s MV E velocity: 113.00 cm/s MV A velocity: 154.00 cm/s  SHUNTS MV E/A ratio:  0.73         Systemic VTI:  0.27 m                             Systemic Diam: 2.00 cm Rozann Lesches MD Electronically signed by Rozann Lesches MD Signature Date/Time: 05/11/2021/1:05:20 PM  Final    CT HEAD CODE STROKE WO CONTRAST  Result Date: 05/20/2021 CLINICAL DATA:  Code stroke. Neuro deficit, acute, stroke suspected. EXAM: CT HEAD WITHOUT CONTRAST TECHNIQUE: Contiguous axial images were obtained from the base of the skull through the vertex without intravenous contrast. COMPARISON:  Head CT 06/10/2007. FINDINGS: Brain: Mild generalized cerebral atrophy, not unexpected for age. There is no acute intracranial hemorrhage. No demarcated cortical infarct. No extra-axial fluid collection. No evidence of an intracranial mass. No midline shift. Vascular: No hyperdense vessel.  Atherosclerotic calcifications. Skull: Normal. Negative for fracture or focal lesion. Sinuses/Orbits: Visualized orbits show no acute finding. Mild mucosal thickening within the bilateral ethmoid and right sphenoid sinuses. ASPECTS ([place] Stroke Program Early CT Score) - Ganglionic level infarction (caudate, lentiform nuclei, internal capsule, insula, M1-M3 cortex): 7 - Supraganglionic infarction (M4-M6 cortex): 3 Total score (0-10 with 10 being normal): 10 These results were communicated to Dr. Rory Percy At 11:09 amon 12/9/2022by text page via the [facility] messaging system. IMPRESSION: No evidence of acute intracranial abnormality. ASPECTS is 10. Mild generalized cerebral atrophy. Mild bilateral ethmoid and right maxillary sinus mucosal thickening. Electronically Signed   By: Kellie Simmering D.O.   On: 05/20/2021 11:11   VAS [place] LOWER EXTREMITY VENOUS  (DVT)  Result Date: 05/22/2021  Lower Venous DVT Study Patient Name:  DAYN BARICH  Date of Exam:   05/22/2021 Medical Rec #: [id number]        Accession #:    [id number] Date of Birth: May 17, 1941       Patient Gender: M Patient Age:   48 years Exam Location:  [facility] Procedure:      VAS [place] LOWER EXTREMITY VENOUS (DVT) Referring Phys: Noemi Chapel --------------------------------------------------------------------------------  Indications: Edema.  Comparison Study: No prior study Performing Technologist: Sharion Dove RVS  Examination Guidelines: A complete evaluation includes B-mode imaging, spectral Doppler, color Doppler, and power Doppler as needed of all accessible portions of each vessel. Bilateral testing is considered an integral part of a complete examination. Limited examinations for reoccurring indications may be performed as noted. The reflux portion of the exam is performed with the patient in reverse Trendelenburg.  +---------+---------------+---------+-----------+----------+--------------+  RIGHT     Compressibility Phasicity Spontaneity Properties Thrombus Aging  +---------+---------------+---------+-----------+----------+--------------+  CFV       Full            Yes       Yes                                    +---------+---------------+---------+-----------+----------+--------------+  SFJ       Full                                                             +---------+---------------+---------+-----------+----------+--------------+  FV Prox   Full                                                             +---------+---------------+---------+-----------+----------+--------------+  FV Mid    Full                                                             +---------+---------------+---------+-----------+----------+--------------+  FV Distal Full                                                             +---------+---------------+---------+-----------+----------+--------------+   PFV       Full                                                             +---------+---------------+---------+-----------+----------+--------------+  POP       Full            Yes       Yes                                    +---------+---------------+---------+-----------+----------+--------------+  PTV       Full                                                             +---------+---------------+---------+-----------+----------+--------------+  PERO      Full                                                             +---------+---------------+---------+-----------+----------+--------------+   +---------+---------------+---------+-----------+----------+--------------+  LEFT      Compressibility Phasicity Spontaneity Properties Thrombus Aging  +---------+---------------+---------+-----------+----------+--------------+  CFV       Full            Yes       Yes                                    +---------+---------------+---------+-----------+----------+--------------+  SFJ       Full                                                             +---------+---------------+---------+-----------+----------+--------------+  FV Prox   Full                                                             +---------+---------------+---------+-----------+----------+--------------+  FV Mid    Full                                                             +---------+---------------+---------+-----------+----------+--------------+  FV Distal Full                                                             +---------+---------------+---------+-----------+----------+--------------+  PFV       Full                                                             +---------+---------------+---------+-----------+----------+--------------+  POP       Full            Yes       Yes                                    +---------+---------------+---------+-----------+----------+--------------+  PTV       Full                                                              +---------+---------------+---------+-----------+----------+--------------+  PERO      Full                                                             +---------+---------------+---------+-----------+----------+--------------+     Summary: BILATERAL: - No evidence of deep vein thrombosis seen in the lower extremities, bilaterally. -No evidence of popliteal cyst, bilaterally.   *See table(s) above for measurements and observations. Electronically signed by Harold Barban MD on 05/22/2021 at 7:39:58 PM.    Final    CT ANGIO HEAD NECK W WO CM W PERF (CODE STROKE)  Result Date: 05/20/2021 CLINICAL DATA:  Stroke suspected EXAM: CT ANGIOGRAPHY HEAD AND NECK TECHNIQUE: Multidetector CT imaging of the head and neck was performed using the standard protocol during bolus administration of intravenous contrast. Multiplanar CT image reconstructions and MIPs were obtained to evaluate the vascular anatomy. Carotid stenosis measurements (when applicable) are obtained utilizing NASCET criteria, using the distal internal carotid diameter as the denominator. CONTRAST:  67mL OMNIPAQUE IOHEXOL 350 MG/ML SOLN COMPARISON:  No prior CTA, correlation is made with same day CT head. FINDINGS: CT HEAD FINDINGS For noncontrast findings, please see same day CT head. CTA NECK FINDINGS Aortic arch: Standard branching. Imaged portion shows no evidence of aneurysm or dissection. No significant stenosis of the major arch vessel origins. Aortic atherosclerosis. The main pulmonary artery measures up to 3.4 cm, above the upper limit of normal, as can be seen in pulmonary hypertension. Right carotid system: No evidence of dissection, stenosis (50% or greater) or occlusion. Left carotid system: Approximately 50% stenosis in the proximal left internal carotid artery, secondary to calcified plaque. No occlusion or dissection. Vertebral arteries: Right dominant. No evidence of dissection, stenosis (50% or greater) or  occlusion. Scattered calcifications in the V1 and V2 segments Skeleton: No acute osseous abnormality. Degenerative changes in the cervical spine. Other neck: The patient is intubated.  Otherwise negative. Upper chest: Atelectasis.  Emphysema. Review of the MIP images confirms the above findings CTA HEAD FINDINGS Anterior circulation: Both internal carotid arteries are patent to the termini, with mild calcifications but without significant stenosis. A1 segments patent. Normal anterior communicating artery. Anterior cerebral arteries are patent to their distal aspects. No M1 stenosis or occlusion. Normal MCA bifurcations. Distal MCA branches perfused and symmetric. Posterior circulation: Vertebral arteries patent to the vertebrobasilar junction without stenosis. Posterior inferior cerebral arteries patent bilaterally. Basilar patent to its distal aspect. Superior cerebellar arteries patent bilaterally. PCAs perfused to their distal aspects without stenosis. The bilateral posterior communicating arteries are not visualized. Venous sinuses: As permitted by contrast timing, patent. Anatomic variants: None significant. Review of the MIP images confirms the above findings IMPRESSION: 1. No intracranial large vessel occlusion or significant stenosis. 2. Approximately 50% stenosis in the proximal left ICA. No other hemodynamically significant stenosis in the neck. 3. Enlargement of the main pulmonary artery, as can be seen in the setting of pulmonary hypertension. 4. Aortic Atherosclerosis (ICD10-I70.0) and Emphysema (ICD10-J43.9). Electronically Signed   By: Merilyn Baba M.D.   On: 05/20/2021 11:36    Orson Eva, DO  [facility]  If 7PM-7AM, please contact night-coverage [email or web address] Password [email or web address] 05/30/2021, 8:24 AM   LOS: 1 day

## 2021-05-30 NOTE — TOC Initial Note (Signed)
Transition of Care [facility]) - Initial/Assessment Note    Patient Details  Name: [name] MRN: [id number] Date of Birth: 06/09/1941  [facility] CM/SW Contact:    Shade Flood, LCSW Phone Number: 05/30/2021, 1:56 PM  Clinical Narrative:                  Pt admitted from [facility] where pt had been for 2 nights after dc from [facility]. Met with pt and his wife today to review dc planning. PT evaluated pt today and stated that pt is doing well enough he could go home with [facility] which is what pt and his wife would like to do. Discussed provider options and referred as requested.   Updated MD who states pt will need NIV and 4prong cane for dc as well. Pt may also need Home O2 but MD will evaluate for this in the AM. Pt and wife aware of DME needs. CMS provider options reviewed and referred as requested.  Will follow up in AM.  Expected Discharge Plan: [place] Barriers to Discharge: Continued Medical Work up   Patient Goals and CMS Choice Patient states their goals for this hospitalization and ongoing recovery are:: go home CMS Medicare.gov Compare Post Acute Care list provided to:: Patient Choice offered to / list presented to : Patient, Spouse  Expected Discharge Plan and Services Expected Discharge Plan: [place] In-house Referral: Clinical Social Work   Post Acute Care Choice: [place] arrangements for the past 2 months: [place]                 DME Arranged: Kasandra Knudsen, NIV DME Agency: [employer] Date DME Agency Contacted: 05/30/21   Representative spoke with at DME Agency: Caryl Pina HH Arranged: RN, PT, OT [facility] Agency: [place] Date [place]: 05/30/21   Representative spoke with at [place]: Sarah  Prior Living Arrangements/Services Living arrangements for the past 2 months: [place] Lives with:: Spouse Patient language and need for interpreter reviewed:: Yes Do you  feel safe going back to the place where you live?: Yes      Need for Family Participation in Patient Care: Yes (Comment) Care giver support system in place?: Yes (comment) Current home services: DME Criminal Activity/Legal Involvement Pertinent to Current Situation/Hospitalization: No - Comment as needed  Activities of Daily Living Home Assistive Devices/Equipment: Environmental consultant (specify type) [employer]) ADL Screening (condition at time of admission) Patient's cognitive ability adequate to safely complete daily activities?: Yes Is the patient deaf or have difficulty hearing?: Yes Does the patient have difficulty seeing, even when wearing glasses/contacts?: No Does the patient have difficulty concentrating, remembering, or making decisions?: No Patient able to express need for assistance with ADLs?: Yes Does the patient have difficulty dressing or bathing?: Yes Independently performs ADLs?: No Communication: Needs assistance Is this a change from baseline?: Pre-admission baseline Dressing (OT): Needs assistance Is this a change from baseline?: Pre-admission baseline Grooming: Needs assistance Is this a change from baseline?: Pre-admission baseline Feeding: Independent Bathing: Needs assistance Is this a change from baseline?: Pre-admission baseline Toileting: Needs assistance Is this a change from baseline?: Pre-admission baseline In/Out Bed: Needs assistance Is this a change from baseline?: Pre-admission baseline Walks in Home: Needs assistance Is this a change from baseline?: Pre-admission baseline Does the patient have difficulty walking or climbing stairs?: Yes Weakness of Legs: Both Weakness of Arms/Hands: Right  Permission Sought/Granted Permission sought to share  information with : Facility Art therapist granted to share information with : Yes, Verbal Permission Granted     Permission granted to share info w AGENCY: HH        Emotional  Assessment Appearance:: Appears younger than stated age Attitude/Demeanor/Rapport: Engaged Affect (typically observed): Pleasant Orientation: : Oriented to Self, Oriented to Place, Oriented to  Time, Oriented to Situation Alcohol / Substance Use: Not Applicable Psych Involvement: No (comment)  Admission diagnosis:  Acute hypercapnic respiratory failure (HCC) [J96.02] Acute on chronic respiratory failure with hypoxia and hypercapnia (HCC) [E17.40, J96.22] Patient Active Problem List   Diagnosis Date Noted   Acute on chronic respiratory failure with hypoxia and hypercapnia ([place]) 81/44/8185   Acute metabolic encephalopathy 63/14/9702   Acute encephalopathy    NSTEMI (non-ST elevated myocardial infarction) ([place]) 05/19/2021   Acute respiratory failure with hypoxia ([place]) 05/19/2021   GERD (gastroesophageal reflux disease)    HOH (hard of hearing)    Elevated liver enzymes    Abdominal aortic aneurysm (AAA) 10/14/2019   Abdominal aortic aneurysm (AAA) greater than 5.5 cm in diameter in male 10/14/2019   Hematuria 09/06/2019   Bladder mass 09/06/2019   AAA (abdominal aortic aneurysm) 09/06/2019   S/P arterial stent 09/06/2019   Acute blood loss anemia 08/08/2019   Iron deficiency anemia due to chronic blood loss 08/08/2019   Thrombocytosis 08/08/2019   Symptomatic anemia 08/07/2019   Coronary artery disease 08/07/2019   Absolute anemia 05/15/2018   Guaiac positive stools 05/15/2018   Iron deficiency anemia 05/14/2018   Leukocytosis 10/09/2017   Murmur 05/15/2014   Polio 05/15/2014   HTN (hypertension) 11/23/2010   CLAUDICATION 05/19/2009   Elevated lipids 09/17/2008   CORONARY ARTERY ANEURYSM 09/17/2008   PCP:  Celene Squibb, MD Pharmacy:   [place], [place] - [address] [place] [address] [place] [address] Phone: [phone] Fax: [phone]  [place], [place] [address] [place]  [address] Phone: [phone] Fax: [phone]     Social Determinants of Health (SDOH) Interventions    Readmission Risk Interventions Readmission Risk Prevention Plan 05/30/2021  Medication Screening Complete  Transportation Screening Complete  Some recent data might be hidden

## 2021-05-30 NOTE — Plan of Care (Signed)

## 2021-05-30 NOTE — Evaluation (Signed)
Physical Therapy Evaluation Patient Details Name: [name] MRN: [id number] DOB: March 06, 1941 Today's Date: 05/30/2021  History of Present Illness  [name] is a 80 y.o. male presents from [facility] with somnolence and hypoxia. Pt admitted 05/29/21 with acute on chronic resp failure with hypoxia and hypercarbia. The patient had a recent hospitalization from 05/19/2021 to 05/27/2021 when he was treated for NSTEMI; heart cath 12/9. PMH: gastric AVMs, bladder cancer s/p TURBT, HTN, hyperlipidemia, CAD s/p PCI to the LAD 2009, AAA s/p repair 10/14/19, post polio syndrome   Clinical Impression  Pt admitted with above diagnosis. Prior to last hospitalization, pt and spouse report pt independent, no using AD, deny any falls in last 6 months, deny home O2 use. Pt currently ambulates with L quad cane, 2 minor LOB but able to recover without physical assist from therapist, VC for cane management to improve safety and steadiness. Trialed RA and 2L but SpO2 unable to remain >92% so returned to 3L- RN aware. Educated pt on time OOB, ambulating to restroom with nursing to get steps in daily, ambulating in hallway with nursing, seated BLE strengthening exercises and pt verbalizes understanding. Pt and spouse adamant pt will be returning home, both agree pt is safe and strong enough to return home, spouse witnessed PT eval and assist level required and reports feeling comfortable with pt returning home. Recommend HHPT for strengthening and OT eval for RUE assessment for sling and education. Pt currently with functional limitations due to the deficits listed below (see PT Problem List). Pt will benefit from skilled PT to increase their independence and safety with mobility to allow discharge to the venue listed below.          Recommendations for follow up therapy are one component of a multi-disciplinary discharge planning process, led by the attending physician.  Recommendations may be updated based on  patient status, additional functional criteria and insurance authorization.  Follow Up Recommendations Home health PT    Assistance Recommended at Discharge Frequent or constant Supervision/Assistance  Functional Status Assessment Patient has had a recent decline in their functional status and demonstrates the ability to make significant improvements in function in a reasonable and predictable amount of time.  Equipment Recommendations  Other (comment) (possibly quad cane)    Recommendations for Other Services       Precautions / Restrictions Precautions Precautions: Fall Precaution Comments: pt and spouse deny any falls in past 6 months Restrictions Weight Bearing Restrictions: No      Mobility  Bed Mobility  General bed mobility comments: in chair upon arrival    Transfers Overall transfer level: Needs assistance Equipment used: Quad cane Transfers: Sit to/from Stand Sit to Stand: Supervision  General transfer comment: powers to stand with BUE assisting, good steadiness, therapist managing all lines for safety    Ambulation/Gait Ambulation/Gait assistance: Min guard Gait Distance (Feet): 150 Feet (x2 with seated rest between) Assistive device: Quad cane Gait Pattern/deviations: Step-through pattern;Decreased stride length Gait velocity: decreased  General Gait Details: RUE dangling, step through pattern, mild LOB x2 but pt able to recover without good stepping strategy and min guard, VC for quad cane management to keep at closer distance for safety and balance with good carryover, therapist managing all lines for safety  Stairs            Wheelchair Mobility    Modified Rankin (Stroke Patients Only)       Balance Overall balance assessment: Needs assistance  Standing balance support: During  functional activity;Single extremity supported;Reliant on assistive device for balance Standing balance-Leahy Scale: Poor Standing balance comment: L quad cane for  balance       Pertinent Vitals/Pain Pain Assessment: No/denies pain    Home Living Family/patient expects to be discharged to:: Private residence Living Arrangements: Spouse/significant other Available Help at Discharge: Family;Available 24 hours/day Type of Home: House Home Access: Stairs to enter Entrance Stairs-Rails: Can reach both Entrance Stairs-Number of Steps: 4   Home Layout: Able to live on main level with bedroom/bathroom;Multi-level Home Equipment: Grab bars - toilet;Grab bars - tub/shower;Shower Land (2 wheels) Additional Comments: RW is for spouse; pt reports using RUE sling in past but hasn't for years    Prior Function Prior Level of Function : Independent/Modified Independent  Mobility Comments: pt reports ind, denies any falls and no AD use ADLs Comments: pt reports ind and driving     Hand Dominance   Dominant Hand: Left    Extremity/Trunk Assessment   Upper Extremity Assessment Upper Extremity Assessment: RUE deficits/detail RUE Deficits / Details: baseline deficits from post polio syndrome, pt reports unable to use functionally, had sling in past but hasn't used for years    Lower Extremity Assessment Lower Extremity Assessment: RLE deficits/detail;LLE deficits/detail RLE Deficits / Details: AROM WNL, strength 4-/5 throughout RLE Sensation: WNL RLE Coordination: WNL LLE Deficits / Details: AROM WNL, L knee extension 1/5 pt reports from past surgery, otherwise 4-/5 throughout LLE Sensation: WNL LLE Coordination: WNL    Cervical / Trunk Assessment Cervical / Trunk Assessment: Kyphotic  Communication   Communication: HOH  Cognition Arousal/Alertness: Awake/alert Behavior During Therapy: WFL for tasks assessed/performed Overall Cognitive Status: Within Functional Limits for tasks assessed  General Comments: HOH     General Comments General comments (skin integrity, edema, etc.): Pt on 3L O2 with SpO2 100%, trialed RA with desat  to 82%, trialed 2L with SpO2 87-89%, increased to 3L and SpO2 >92%- remained on 3L O2 at EOS and RN notified of readings    Exercises     Assessment/Plan    PT Assessment Patient needs continued PT services  PT Problem List Decreased strength;Decreased range of motion;Decreased activity tolerance;Decreased balance;Decreased mobility;Decreased knowledge of use of DME;Decreased safety awareness;Cardiopulmonary status limiting activity       PT Treatment Interventions DME instruction;Gait training;Stair training;Functional mobility training;Therapeutic activities;Therapeutic exercise;Balance training;Neuromuscular re-education;Patient/family education    PT Goals (Current goals can be found in the Care Plan section)  Acute Rehab PT Goals Patient Stated Goal: go home, "I will not go back to [facility]" PT Goal Formulation: With patient/family Time For Goal Achievement: 06/13/21 Potential to Achieve Goals: Good    Frequency Min 3X/week   Barriers to discharge        Co-evaluation               AM-PAC PT "6 Clicks" Mobility  Outcome Measure Help needed turning from your back to your side while in a flat bed without using bedrails?: A Little Help needed moving from lying on your back to sitting on the side of a flat bed without using bedrails?: A Little Help needed moving to and from a bed to a chair (including a wheelchair)?: A Little Help needed standing up from a chair using your arms (e.g., wheelchair or bedside chair)?: A Little Help needed to walk in hospital room?: A Little Help needed climbing 3-5 steps with a railing? : A Lot 6 Click Score: 17    End of Session Equipment Utilized During  Treatment: Oxygen;Gait belt Activity Tolerance: Patient tolerated treatment well Patient left: in chair;with call bell/phone within reach;with family/visitor present Nurse Communication: Mobility status;Other (comment) (SpO2) PT Visit Diagnosis: Unsteadiness on feet (R26.81);Muscle  weakness (generalized) (M62.81);Difficulty in walking, not elsewhere classified (R26.2)    Time: 0100-7121 PT Time Calculation (min) (ACUTE ONLY): 47 min   Charges:   PT Evaluation $PT Eval Low Complexity: 1 Low PT Treatments $Gait Training: 8-22 mins $Self Care/Home Management: 8-22         Talbot Grumbling PT, DPT 05/30/21, 12:18 PM

## 2021-05-30 NOTE — Consult Note (Signed)
[facility]   Patient name: [name] Admit date: 05/29/2021  DOB: 1941-03-03 LOS: 1  MRN: [id number] Consult date: 05/30/2021  Referring provider: Dr. Carles Collet, [facility]: Hypoxia    History:  80 yo male former smoker was in hospital from 05/19/21 to 05/27/21 with hypercapnic respiratory failure requiring intubation.  He was also treated for NSTEMI and HCAP.  He was transferred from [facility] to [facility] ER on 09/62/83 with low SpO2 in 60's with physical therapy.  His family also was concerned about him getting too sleepy.  In the ER he was started on Bipap with improvement in mentation and oxygenation.  Past medical history:  CAD s/p PCI, AAA s/p repair, HTN, HLD, GERD, Bladder cancer s/p TURBT, Polio with Rt sided atrophy  Significant events:  12/18 Admit  Studies:  Echo 05/11/21 >> EF 65 to 70%, grade 1 DD, RVSP 27.2 mmHg, mild LA dilation, mod AS LHC 05/20/21 >> no culprit lesion to explain NSTEMI, mod AS CT angio chest 05/21/21 >> atherosclerosis, trace effusions, 4 mm nodule RUL  Micro:    Lines:     Antibiotics:    Consults:      Interim history:  He had polio at age 89.  Initially he lost the function in his right arm and both his legs.  He eventually regained some function in his legs.  His wife says she has noticed trouble with his breathing and alertness for a while and this is getting progressively worse.  He would get more sleepy and twitch intermittently.  He would also get frequent headaches.  All of this improved when he was started on supplemental oxygen and Bipap in hospital.  Vital signs:  BP 111/62    Pulse 70    Temp 98.3 F (36.8 C) (Oral)    Resp (!) 21    Ht 5\' 6"  (1.676 m)    Wt 89.8 kg    SpO2 99%    BMI 31.96 kg/m   Intake/output:  I/O last 3 completed shifts: In: 250 [P.O.:250] Out: 1200 [Urine:1200]   Physical exam:   General - alert Eyes - pupils reactive ENT - no sinus tenderness, no  stridor Cardiac - regular rate/rhythm, 2/6 SM Chest - equal breath sounds b/l, no wheezing or rales Abdomen - soft, non tender, + bowel sounds Extremities - no cyanosis, clubbing, or edema Skin - no rashes Neuro - weak in Rt arm, moves other extremities, follows commands Psych - normal mood and behavior  Best practice:   DVT - lovenox SUP - protonix Nutrition - regular diet   Discussion:  He has history of polio with persistent muscle weakness in his right arm.  He more recently has progressive weakness and fatigue.  This has been associated with hypoxic and hypercapnic respiratory failure.  I am concerned he could have post-polio syndrome with progressive respiratory muscle weakness.  He also has aortic stenosis which could be contributing to his dyspnea.  Patient's chronic respiratory failure due to neuromuscular disorder is life threatening.  Previous ABG's have documented high PCO2.  Patient would benefit from non-invasive ventilation.  Without this therapy, the patient is at high risk of ending up with worsening symptoms, worsened respiratory failure, need for ER visits and/or recurrent hospitalizations.  Bilevel device unable to adequately support patient's nocturnal ventilation needs.  Patient would benefit from NIV therapy with set tidal volumes and pressure.  Assessment/plan:   Acute on chronic hypoxic/hypercapnic respiratory failure likely from neuromuscular disorder with  post-polio syndrome. - continue supplemental oxygen to keep SpO2 90 to 95% - Bipap qhs and prn during the day - he would benefit from using Trilogy home vent after discharge - could then follow up in office as outpt to determine additional testing needed (PFT, PSG, etc) - he might need further neurology assessment as an outpt to better determine extent of neuro symptoms that could be related to after effects of polio  Rt lung nodule. - follow up at outpt  Aortic stenosis. - followed by Dr. [name] with  [facility] cardiology  Resolved hospital problems:    Goals of care/Family discussions:  Code status: full code  Updated pt's wife at bedside  Labs:   CMP Latest Ref Rng & Units 05/30/2021 05/29/2021 05/22/2021  Glucose 70 - 99 mg/dL 123(H) 171(H) 108(H)  BUN 8 - 23 mg/dL 21 19 22   Creatinine 0.61 - 1.24 mg/dL 0.55(L) 0.50(L) 0.52(L)  Sodium 135 - 145 mmol/L 140 138 138  Potassium 3.5 - 5.1 mmol/L 3.8 4.2 3.9  Chloride 98 - 111 mmol/L 94(L) 96(L) 100  CO2 22 - 32 mmol/L 38(H) 37(H) 30  Calcium 8.9 - 10.3 mg/dL 8.8(L) 8.6(L) 8.1(L)  Total Protein 6.5 - 8.1 g/dL 6.7 7.4 6.1(L)  Total Bilirubin 0.3 - 1.2 mg/dL 0.6 0.3 1.2  Alkaline Phos 38 - 126 U/L 60 69 65  AST 15 - 41 U/L 20 21 117(H)  ALT 0 - 44 U/L 48(H) 61(H) 385(H)    CBC Latest Ref Rng & Units 05/30/2021 05/29/2021 05/23/2021  WBC 4.0 - 10.5 K/uL 11.1(H) 9.5 11.1(H)  Hemoglobin 13.0 - 17.0 g/dL 8.8(L) 9.6(L) 8.9(L)  Hematocrit 39.0 - 52.0 % 33.6(L) 37.9(L) 34.2(L)  Platelets 150 - 400 K/uL 443(H) 439(H) 275    ABG    Component Value Date/Time   PHART 7.311 (L) 05/29/2021 1429   PCO2ART 78.7 (HH) 05/29/2021 1429   PO2ART 73.9 (L) 05/29/2021 1429   HCO3 34.5 (H) 05/29/2021 1429   TCO2 36 (H) 05/20/2021 1222   ACIDBASEDEF 1.0 10/14/2019 1406   O2SAT 93.6 05/29/2021 1429    CBG (last 3)  No results for input(s): GLUCAP in the last 72 hours.   Past surgical history:  He  has a past surgical history that includes Cardiac catheterization (07-09-2007); Cholecystectomy; Coronary stent placement; Colonoscopy with propofol (N/A, 05/31/2018); Esophagogastroduodenoscopy (egd) with propofol (N/A, 05/31/2018); polypectomy (05/31/2018); Esophagogastroduodenoscopy (egd) with propofol (N/A, 08/08/2019); Knee surgery; Transurethral resection of bladder tumor (N/A, 09/26/2019); Cystoscopy w/ retrogrades (Bilateral, 09/26/2019); Abdominal aortic aneurysm repair (N/A, 10/14/2019); and LEFT HEART CATH AND CORONARY ANGIOGRAPHY (N/A,  05/20/2021).  Social history:  He  reports that he quit smoking about 16 years ago. His smoking use included cigarettes. He has a 15.00 pack-year smoking history. He has never used smokeless tobacco. He reports that he does not drink alcohol and does not use drugs.   Review of systems:  Reviewed and negative  Family history:  His family history includes Heart attack in his father; Heart attack (age of onset: 65) in his brother; Hypertension in his mother.    Medications:   No current facility-administered medications on file prior to encounter.   Current Outpatient Medications on File Prior to Encounter  Medication Sig   aspirin EC 81 MG tablet Take 81 mg by mouth daily.   atorvastatin (LIPITOR) 40 MG tablet Take 1 tablet by mouth once daily (Patient taking differently: Take 40 mg by mouth See admin instructions. Take 1 tablet every morning and 1 tablet at  bedtime)   clopidogrel (PLAVIX) 75 MG tablet Take 1 tablet by mouth once daily   ELDERBERRY PO Take 1 tablet by mouth daily.    ferrous sulfate 325 (65 FE) MG tablet Take 1 tablet (325 mg total) by mouth 2 (two) times daily with a meal. (Patient taking differently: Take 325 mg by mouth daily with breakfast.)   nitroGLYCERIN (NITROSTAT) 0.4 MG SL tablet Place 1 tablet (0.4 mg total) under the tongue every 5 (five) minutes x 3 doses as needed for chest pain.   ondansetron (ZOFRAN) 4 MG tablet Take 8 mg by mouth 2 (two) times daily.   pantoprazole (PROTONIX) 40 MG tablet Take 1 tablet (40 mg total) by mouth daily. Take 30 min before meals   Probiotic Product (PROBIOTIC PO) Take 1 tablet by mouth daily.     Signature:  Chesley Mires, MD [place] Pager - [phone] 05/30/2021, 4:25 PM

## 2021-05-30 NOTE — Discharge Summary (Signed)
Physician Discharge Summary  [name] FHL:[id number] DOB: July 13, 1940 DOA: 05/29/2021  PCP: Celene Squibb, MD  Admit date: 05/29/2021 Discharge date: 05/31/2021  Admitted From: Home Disposition:  Home   Recommendations for Outpatient Follow-up:  Follow up with PCP in 1-2 weeks Please obtain BMP/CBC in one week   Home Health: YES--HHPT; 2L [place]   Discharge Condition: Stable CODE STATUS: FULL Diet recommendation: Heart Healthy  Brief/Interim Summary: 80 y.o. male with medical history of coronary artery disease with recent NSTEMI December 2022, gastric AVMs, bladder cancer s/p TURBT, hypertension, hyperlipidemia, CAD status post PCI to the LAD 2009 presenting from [facility] with somnolence and hypoxia.  The patient had a recent hospitalization from 05/20/2019 to 05/27/2021 when he was treated for NSTEMI.  Patient underwent heart catheterization on 05/20/2021 which showed patent LAD stent and moderate mid LAD stenosis of 50% with minimal diffuse disease elsewhere.  Post heart catheterization, the patient became unresponsive and he required intubation and was admitted to the ICU for acute hypoxic and hypercarbic respiratory failure.  It was felt that the patient likely had an underlying sleep disorder that likely contributed to his respiratory failure.  His hospitalization was prolonged by the development of pneumonia for which he completed 5 days of cefepime.  He had a CTA of the chest was negative for PE.  MRI was recommended, but the patient refused because he was claustrophobic.  He was discharged to SNF for 2 L nasal cannula. According to the patient's daughter at the bedside, she stated that he was not on any supplemental oxygen while she visited him on 05/28/2021.  Apparently he participated in physical therapy on that day and developed hypoxia down into the 60s with exertion.  Once he was rested his saturation came back up to the 90s.  Nevertheless, his therapy was aborted because of his  hypoxia.  When his family came to visit him again on 05/30/2019 2 in the morning, the patient was noted to be somnolent sitting up in chair.  His oxygen saturation was checked, and it was in the 70s.  As result, the patient was brought to emergency department for further evaluation. At the time of my evaluation, the patient is on BiPAP, but he is awake and answers simple questions appropriately.  He does not recall the events of earlier in the day, but he denies any fevers, chills, headache, chest pain, shortness of breath, nausea, vomiting, diarrhea, abd pain.  Notably, the patient was not discharged to [facility] with any hypnotic or opioid medications In retrospect, the patient's daughter states that the patient has had daytime hypersomnolence "for at least a few months" given prior to his hospitalization for NSTEMI. ED In the emergency department, the patient had low-grade temperature 99.0 F.  He was hemodynamically stable with oxygen saturation of 95% FiO2 30%.  The patient did desaturate into the 80s initially on room air prior to BiPAP.  BMP showed sodium 138, potassium 4.2, bicarbonate 37, serum creatinine 0.50.  LFTs were unremarkable.  WBC 9.5, hemoglobin 9.6, platelets 139,000.  Troponin 37.  BNP 188.  Chest x-ray showed low volumes without any consolidation. VBG showed pH 7.2 2/96/51/on 2 L Patient was placed on BiPAP with clinical improvement.  Pulmonary was consulted and was concerned about post-polio syndrome resulting in respiratory failure.  Patient was set up with home NIV at time of d/c.  Referral was made to neuromuscular physician, Dr. Narda Amber, for further work up of possible underlying neuromuscular disorder.  Discharge Diagnoses:  Acute on chronic respiratory failure with hypoxia and hypercarbia -Given the patient's history of daytime hypersomnolence and " difficulty waking up after surgery", I suspect the patient has an underlying sleep disorder vs post-polio syndrome -Weaned  off BiPAP  -Patient was discharged from [facility] on 05/27/2021 with 2 L nasal cannula -Patient appeared to be slightly on the hypervolemic side>> given Lasix x1 IV on 12/18 -TSH--0.742 -Minimize any hypnotic or sedative medications -now on 3L>>2L -pulmonary consult appreciated>>concerned about post-polio syndrome -outpatient referral to Dr. Narda Amber for neuromuscular disorder workup -he is set up with home NIV at time of d/c     [name] has chronic hypoxic and hypercapnic respiratory failure.  Despite aggressive treatment , patient continues to exhibit signs of significant hypercapnia associated with chronic respiratory failure secondary to COPD and underlying sleep disorder  Patient requires the use of NIV both nightly and daytime to help with exacerbation periods.  -On admission patient had uncompensated respiratory acidosis with severe hypercapnia -VBG on admission was-- PH 7.227, pCo2 was 96.7, oxygen sats was 96 % , Po2 was 51.9 on 28 % Fio2   - The use of the NIV will treat patient's high PCO2 levels (please see ABG results on Bipap), and use of NIV can reduce risk of exacerbation in future hospitalizations when used at night and during the day.   Patient will need these advanced settings in conjunction with the current medication regimen and aggressive pulmonary treatment: BiPAP is not an option due to his functional limitations and the severity of the patient's condition.    Failure to have NIV available for use could lead to death.   patient had significant episodes of severe lethargy and unresponsiveness due to very very high CO2 levels    ABG on  date 05/29/21   at time 1429 am done on BIPAP with Fi02 of 30% Arterial Blood Gas result:  pO2 73 ; pCO2 78,  pH 7.311,  O2 Sat 96 %   Acute metabolic encephalopathy -Secondary to hypercapnic respiratory failure -TSH--0.742 -B12--449 -Ammonia--36 -UA--no pyuria -12/19--more alert, back to baseline   Coronary artery  disease -No chest pain presently -Recent NSTEMI 05/19/2021 -Heart catheterization 05/21/2019.  As discussed above -History of PCI to LAD 2009 -Continue aspirin Plavix -EKG without concerning ischemic change -05/11/2021 echo EF 65-70%, no WMA, moderate AS, increased LVEDP   Hyperlipidemia -Continue statin   Right lung nodule -Outpatient surveillance   Iron deficiency anemia -Continue iron supplementation   GERD -Continue Protonix   Deconditioning -PT eval>>HHPT   Hyperammonemia -ammonia = 36 -due to chronic hepatic congestion -mentation improving  Aortic Stenosis -outpatient cardiac follow up  Discharge Instructions  Discharge Instructions     Ambulatory referral to Neurology   Complete by: As directed    Refer to [facility]--Dr. Narda Amber Work up for post-polio syndrome resulting in chronic respiratory failure      Allergies as of 05/31/2021       Reactions   Tramadol Other (See Comments)   Excessive sleepiness/difficulty waking patient        Medication List     TAKE these medications    aspirin EC 81 MG tablet Take 81 mg by mouth daily.   atorvastatin 40 MG tablet Commonly known as: LIPITOR Take 1 tablet by mouth once daily What changed:  when to take this additional instructions   clopidogrel 75 MG tablet Commonly known as: PLAVIX Take 1 tablet by mouth once daily   ELDERBERRY PO Take 1  tablet by mouth daily.   ferrous sulfate 325 (65 FE) MG tablet Take 1 tablet (325 mg total) by mouth 2 (two) times daily with a meal. What changed: when to take this   nitroGLYCERIN 0.4 MG SL tablet Commonly known as: NITROSTAT Place 1 tablet (0.4 mg total) under the tongue every 5 (five) minutes x 3 doses as needed for chest pain.   ondansetron 4 MG tablet Commonly known as: ZOFRAN Take 8 mg by mouth 2 (two) times daily.   pantoprazole 40 MG tablet Commonly known as: PROTONIX Take 1 tablet (40 mg total) by mouth daily. Take 30 min  before meals   PROBIOTIC PO Take 1 tablet by mouth daily.               Durable Medical Equipment  (From admission, onward)           Start     Ordered   05/31/21 0907  For home use only DME oxygen  Once       Question Answer Comment  Length of Need 6 Months   Mode or (Route) Nasal cannula   Liters per Minute 2   Oxygen delivery system Gas      05/31/21 0906   05/30/21 1400  For home use only DME Cane  Once       Comments: [place]/Quad Cane please   05/30/21 1359            Allergies  Allergen Reactions   Tramadol Other (See Comments)    Excessive sleepiness/difficulty waking patient    Consultations: pulmonary   Procedures/Studies: CT ABDOMEN PELVIS WO CONTRAST  Result Date: 05/20/2021 CLINICAL DATA:  Status post cardiac catheterization this morning, evaluate for retroperitoneal bleed EXAM: CT ABDOMEN AND PELVIS WITHOUT CONTRAST TECHNIQUE: Multidetector CT imaging of the abdomen and pelvis was performed following the standard protocol without IV contrast. COMPARISON:  05/19/2021 FINDINGS: Lower chest: Aortic valve calcifications. Coronary artery calcifications. Small bilateral pleural effusions, increased compared to prior examination. Hepatobiliary: No focal liver abnormality is seen. Status post cholecystectomy. No biliary dilatation. Pancreas: Unremarkable. No pancreatic ductal dilatation or surrounding inflammatory changes. Spleen: Normal in size without significant abnormality. Adrenals/Urinary Tract: Adrenal glands are unremarkable. Excreted contrast in the collecting systems and urinary bladder, in keeping with same-day coronary catheterization. Kidneys are otherwise normal, without renal calculi, solid lesion, or hydronephrosis. Bladder is unremarkable. Stomach/Bowel: Stomach is within normal limits. Appendix appears normal. No evidence of bowel wall thickening, distention, or inflammatory changes. Vascular/Lymphatic: Aortic atherosclerosis. Status post  graft repair of the abdominal aorta. No enlarged abdominal or pelvic lymph nodes. Reproductive: No mass or other significant abnormality. Other: No abdominal wall hernia or abnormality. Subcutaneous soft tissue stranding and pressure dressing overlying the right femoral vessels (series 3, image 89). No abdominopelvic ascites. Musculoskeletal: No acute or significant osseous findings. Chronic pars defects of L5. IMPRESSION: 1. No evidence of retroperitoneal hematoma or other acute noncontrast CT findings in the abdomen or pelvis. 2. Subcutaneous soft tissue stranding and pressure dressing overlying the right femoral vessels, in keeping with catheterization. 3. Small bilateral pleural effusions, increased compared to prior examination. 4. Status post graft repair of the abdominal aorta. Aortic Atherosclerosis (ICD10-I70.0). Electronically Signed   By: Delanna Ahmadi M.D.   On: 05/20/2021 11:34   DG Chest 1 View  Result Date: 05/19/2021 CLINICAL DATA:  Shortness of breath. EXAM: CHEST  1 VIEW COMPARISON:  Chest x-ray from same day at 1222 hours. FINDINGS: Stable cardiomegaly. Unchanged bibasilar atelectasis/scarring. Linear  density overlying the peripheral right lung corresponds to a prominent lung marking on CT from same day. No focal consolidation, pleural effusion, or pneumothorax. No acute osseous abnormality. IMPRESSION: 1. Linear density overlying the peripheral right lung corresponds to a prominent lung marking. No pneumothorax. Electronically Signed   By: Titus Dubin M.D.   On: 05/19/2021 14:41   DG Abd 1 View  Result Date: 05/20/2021 CLINICAL DATA:  OG tube placement. EXAM: ABDOMEN - 1 VIEW COMPARISON:  10/14/2019 FINDINGS: 1153 hours. OG tube tip is at the esophagogastric junction with proximal side port in the distal esophagus. Diffuse gaseous distention of small bowel and colon evident. IMPRESSION: OG tube tip is at the esophagogastric junction with proximal side port in the distal esophagus.  Tube could be advanced 9-10 cm to place the proximal side port below the GE junction. Electronically Signed   By: Misty Stanley M.D.   On: 05/20/2021 12:04   CT Angio Chest Pulmonary Embolism (PE) W or WO Contrast  Result Date: 05/21/2021 CLINICAL DATA:  High probability for pulmonary embolism.  The Pneumonia EXAM: CT ANGIOGRAPHY CHEST WITH CONTRAST TECHNIQUE: Multidetector CT imaging of the chest was performed using the standard protocol during bolus administration of intravenous contrast. Multiplanar CT image reconstructions and MIPs were obtained to evaluate the vascular anatomy. CONTRAST:  130mL OMNIPAQUE IOHEXOL 350 MG/ML SOLN COMPARISON:  None. FINDINGS: Cardiovascular: The heart is mildly enlarged. Coronary artery calcifications are noted. No pulmonary artery emboli. Mediastinum/Nodes: No enlarged mediastinal, hilar, or axillary lymph nodes. Thyroid gland, trachea, and esophagus demonstrate no significant findings. ET tube in appropriate position. NG tube terminates in the proximal stomach. Lungs/Pleura: Trace bilateral pleural effusions with mild adjacent atelectasis. No focal airspace opacity to indicate pneumonia. 4 mm nodule seen in the right upper lobe (46/6). Upper Abdomen: No acute abnormality. Musculoskeletal: No chest wall abnormality. No acute or significant osseous findings. Review of the MIP images confirms the above findings. IMPRESSION: 1. No pulmonary artery embolism. 2. Trace bilateral pleural effusions with mild adjacent atelectasis. 3. 4 mm right upper lobe noncalcified pulmonary nodule (46/6). No follow-up needed if patient is low-risk. Non-contrast chest CT can be considered in 12 months if patient is high-risk. This recommendation follows the consensus statement: Guidelines for Management of Incidental Pulmonary Nodules Detected on CT Images: From the Fleischner Society 2017; Radiology 2017; 284:228-243. Electronically Signed   By: Miachel Roux M.D.   On: 05/21/2021 09:56   CT  ABDOMEN PELVIS W CONTRAST  Result Date: 05/19/2021 CLINICAL DATA:  Acute generalized abdominal pain. EXAM: CT ABDOMEN AND PELVIS WITH CONTRAST TECHNIQUE: Multidetector CT imaging of the abdomen and pelvis was performed using the standard protocol following bolus administration of intravenous contrast. CONTRAST:  123mL OMNIPAQUE IOHEXOL 300 MG/ML  SOLN COMPARISON:  September 23, 2019. FINDINGS: Lower chest: Mild bibasilar subsegmental atelectasis is noted. Hepatobiliary: No focal liver abnormality is seen. Status post cholecystectomy. No biliary dilatation. Pancreas: Unremarkable. No pancreatic ductal dilatation or surrounding inflammatory changes. Spleen: Normal in size without focal abnormality. Adrenals/Urinary Tract: Adrenal glands appear normal. Right renal cyst is noted. No hydronephrosis or renal obstruction is noted. No renal or ureteral calculi are noted. Urinary bladder is unremarkable. Stomach/Bowel: Stomach is within normal limits. Appendix appears normal. No evidence of bowel wall thickening, distention, or inflammatory changes. Vascular/Lymphatic: Status post surgical repair of abdominal aorta. No adenopathy is noted. Reproductive: Prostate is unremarkable. Other: No abdominal wall hernia or abnormality. No abdominopelvic ascites. Musculoskeletal: Minimal grade 1 anterolisthesis is noted secondary to bilateral  L5 spondylolysis. IMPRESSION: No acute abnormality seen in the abdomen or pelvis. Aortic Atherosclerosis (ICD10-I70.0). Electronically Signed   By: Marijo Conception M.D.   On: 05/19/2021 13:33   CARDIAC CATHETERIZATION  Result Date: 05/20/2021   Ost LAD to Prox LAD lesion is 40% stenosed.  Prox LAD to Mid LAD overlapping stent segment is mostly widely patent with 5% in-stent stenosis   Mid LAD lesion is 50% stenosed.   Ramus lesion is 40% stenosed.   Mild to moderate diffuse disease elsewhere.  Codominant system.   LV end diastolic pressure is mildly elevated.   There is moderate aortic valve  stenosis. SUMMARY Stable single-vessel disease with widely patent LAD stents and moderate mid LAD 50% stenosis. No obvious culprit lesion to explain non-STEMI. Mildly elevated LVEDP with normal EF by echo. Moderate aortic stenosis by echo with mean gradient of roughly 20 mmHg by cath. RECOMMENDATIONS Suspect demand ischemia with ongoing illness Continue medical management. Okay to discontinue heparin Glenetta Hew, MD  DG Chest [facility] 1 View  Result Date: 05/29/2021 CLINICAL DATA:  Low oxygen saturation., weakness. EXAM: PORTABLE CHEST 1 VIEW COMPARISON:  CT examination dated May 21, 2021 FINDINGS: The heart is enlarged. Aortic arch atherosclerotic calcifications. Low lung volumes. No focal consolidation. Chronic interstitial markings. IMPRESSION: 1. Low lung volumes without evidence of focal consolidation or large pleural effusion. 2.  Cardiomegaly. Electronically Signed   By: Keane Police D.O.   On: 05/29/2021 10:22   DG Chest Port 1 View  Result Date: 05/21/2021 CLINICAL DATA:  Pneumonia. EXAM: PORTABLE CHEST 1 VIEW COMPARISON:  May 20, 2021. FINDINGS: Stable cardiomediastinal silhouette. Endotracheal and nasogastric tubes are unchanged. Stable bibasilar subsegmental atelectasis is noted. Bony thorax is unremarkable. IMPRESSION: Stable support apparatus. Stable bibasilar subsegmental atelectasis. Electronically Signed   By: Marijo Conception M.D.   On: 05/21/2021 09:28   DG CHEST PORT 1 VIEW  Result Date: 05/20/2021 CLINICAL DATA:  Endotracheal tube placement. EXAM: PORTABLE CHEST 1 VIEW COMPARISON:  Same day. FINDINGS: Stable cardiomegaly. Endotracheal and nasogastric tubes are unchanged in position. Stable bilateral lung opacities are noted concerning for atelectasis or possibly infiltrates. Bony thorax is unremarkable. IMPRESSION: Stable support apparatus.  Stable bibasilar opacities. Electronically Signed   By: Marijo Conception M.D.   On: 05/20/2021 12:05   DG CHEST PORT 1 VIEW  Result  Date: 05/20/2021 CLINICAL DATA:  80 year old male with history of non ST elevation myocardial infarction. EXAM: PORTABLE CHEST 1 VIEW COMPARISON:  Chest x-ray 05/19/2021. FINDINGS: An endotracheal tube is in place with tip 3.2 cm above the carina. Transcutaneous defibrillator pad projecting over the mid thorax. Lung volumes are low. Bibasilar opacities (left greater than right) may reflect areas of atelectasis and/or consolidation. Small left pleural effusion. No definite right pleural effusion. No pneumothorax. Nodular density projecting over the right upper lobe, not readily apparent on the recent prior examination, potentially artifact, measuring 1.4 cm in diameter. Mild diffuse interstitial prominence and cephalization of the pulmonary vasculature, suggesting a background of mild interstitial pulmonary edema. Heart size is mildly enlarged. The patient is rotated to the left on today's exam, resulting in distortion of the mediastinal contours and reduced diagnostic sensitivity and specificity for mediastinal pathology. Atherosclerotic calcifications in the thoracic aorta. IMPRESSION: 1. Support apparatus, as above. 2. The appearance the chest suggests mild congestive heart failure, as above. 3. Bibasilar opacities which may reflect areas of atelectasis and/or consolidation. 4. New nodular density projecting over the region of the right upper lobe, potentially  artifactual. Close attention on follow-up studies is recommended to ensure resolution of this finding. 5. Aortic atherosclerosis. Electronically Signed   By: Vinnie Langton M.D.   On: 05/20/2021 10:58   DG Chest Portable 1 View  Result Date: 05/19/2021 CLINICAL DATA:  Shortness of breath EXAM: PORTABLE CHEST 1 VIEW COMPARISON:  Previous studies including the examination of 10/15/2019 FINDINGS: Transverse diameter of heart is increased. There are no signs of pulmonary edema. There are linear densities in medial left lower lung fields. Left lateral CP  angle is indistinct. There is no definite pneumothorax. Faint linear density seen in the lateral aspect of right lower lung fields may suggest pleural thickening. Less likely possibility would be loculated right pneumothorax. IMPRESSION: Cardiomegaly. Increased markings in the medial lower lung fields may suggest scarring or subsegmental atelectasis. There are no new focal infiltrates. There is faint linear radiopacity in the lateral aspect of right lower lung fields which may be due to pleural thickening or suggest small loculated right pneumothorax. There is no demonstrable right apical pneumothorax. Repeat PA views of chest in the inspiration and expiration may be considered. Electronically Signed   By: Elmer Picker M.D.   On: 05/19/2021 12:42   ECHOCARDIOGRAM COMPLETE  Result Date: 05/11/2021    ECHOCARDIOGRAM REPORT   Patient Name:   KALIEB FREELAND Date of Exam: 05/11/2021 Medical Rec #:  [id number]       Height:       66.0 in Accession #:    [id number]      Weight:       197.0 lb Date of Birth:  29-Aug-1940      BSA:          1.987 m Patient Age:    74 years        BP:           159/80 mmHg Patient Gender: M               HR:           90 bpm. Exam Location:  [facility] Procedure: 2D Echo, Cardiac Doppler and Color Doppler Indications:    I35.0 (ICD-10-CM) - Nonrheumatic aortic valve stenosis  History:        Patient has prior history of Echocardiogram examinations, most                 recent 05/14/2020. CAD, Aortic Valve Disease,                 Signs/Symptoms:Murmur; Risk Factors:Hypertension and                 Dyslipidemia. GERD.  Sonographer:    Alvino Chapel RCS Referring Phys: Phil Campbell  1. Left ventricular ejection fraction, by estimation, is 65 to 70%. The left ventricle has normal function. The left ventricle has no regional wall motion abnormalities. Left ventricular diastolic parameters are consistent with Grade I diastolic dysfunction (impaired relaxation).  Elevated left ventricular end-diastolic pressure.  2. Right ventricular systolic function is normal. The right ventricular size is mildly enlarged. There is normal pulmonary artery systolic pressure. The estimated right ventricular systolic pressure is 63.0 mmHg.  3. Left atrial size was mildly dilated.  4. The mitral valve is abnormal. Trivial mitral valve regurgitation. Moderate mitral annular calcification.  5. The aortic valve is tricuspid. There is severe calcifcation of the aortic valve. Aortic valve regurgitation is trivial. Moderate aortic valve stenosis. Aortic valve mean gradient measures 21.0 mmHg. Dimentionless index  21 mmHg.  6. The inferior vena cava is normal in size with greater than 50% respiratory variability, suggesting right atrial pressure of 3 mmHg. Comparison(s): Prior images reviewed side by side. Aortic stenosis is in moderate range with mean gradient 21 mmHg (previoiusly 18 mmHg). FINDINGS  Left Ventricle: Left ventricular ejection fraction, by estimation, is 65 to 70%. The left ventricle has normal function. The left ventricle has no regional wall motion abnormalities. The left ventricular internal cavity size was normal in size. There is  borderline left ventricular hypertrophy. Left ventricular diastolic parameters are consistent with Grade I diastolic dysfunction (impaired relaxation). Elevated left ventricular end-diastolic pressure. Right Ventricle: The right ventricular size is mildly enlarged. No increase in right ventricular wall thickness. Right ventricular systolic function is normal. There is normal pulmonary artery systolic pressure. The tricuspid regurgitant velocity is 2.46  m/s, and with an assumed right atrial pressure of 3 mmHg, the estimated right ventricular systolic pressure is 71.2 mmHg. Left Atrium: Left atrial size was mildly dilated. Right Atrium: Right atrial size was normal in size. Pericardium: There is no evidence of pericardial effusion. Mitral Valve: The  mitral valve is abnormal. Moderate mitral annular calcification. Trivial mitral valve regurgitation. Tricuspid Valve: The tricuspid valve is grossly normal. Tricuspid valve regurgitation is trivial. Aortic Valve: The aortic valve is tricuspid. There is severe calcifcation of the aortic valve. There is mild to moderate aortic valve annular calcification. Aortic valve regurgitation is trivial. Moderate aortic stenosis is present. Aortic valve mean gradient measures 21.0 mmHg. Aortic valve peak gradient measures 39.4 mmHg. Aortic valve area, by VTI measures 1.41 cm. Pulmonic Valve: The pulmonic valve was grossly normal. Pulmonic valve regurgitation is trivial. Aorta: The aortic root is normal in size and structure. Venous: The inferior vena cava is normal in size with greater than 50% respiratory variability, suggesting right atrial pressure of 3 mmHg. IAS/Shunts: No atrial level shunt detected by color flow Doppler.  LEFT VENTRICLE PLAX 2D LVIDd:         5.20 cm   Diastology LVIDs:         2.80 cm   LV e' medial:    5.22 cm/s LV PW:         1.00 cm   LV E/e' medial:  21.6 LV IVS:        1.00 cm   LV e' lateral:   6.96 cm/s LVOT diam:     2.00 cm   LV E/e' lateral: 16.2 LV SV:         84 LV SV Index:   42 LVOT Area:     3.14 cm  RIGHT VENTRICLE RV S prime:     17.40 cm/s TAPSE (M-mode): 2.2 cm LEFT ATRIUM             Index LA diam:        5.30 cm 2.67 cm/m LA Vol (A2C):   51.3 ml 25.82 ml/m LA Vol (A4C):   77.0 ml 38.75 ml/m LA Biplane Vol: 67.8 ml 34.12 ml/m  AORTIC VALVE AV Area (Vmax):    1.26 cm AV Area (Vmean):   1.34 cm AV Area (VTI):     1.41 cm AV Vmax:           314.00 cm/s AV Vmean:          207.000 cm/s AV VTI:            0.595 m AV Peak Grad:      39.4 mmHg AV Mean Grad:  21.0 mmHg LVOT Vmax:         126.00 cm/s LVOT Vmean:        88.500 cm/s LVOT VTI:          0.267 m LVOT/AV VTI ratio: 0.45  AORTA Ao Root diam: 3.50 cm MITRAL VALVE                TRICUSPID VALVE MV Area (PHT): 2.37 cm     TR  Peak grad:   24.2 mmHg MV Decel Time: 320 msec     TR Vmax:        246.00 cm/s MV E velocity: 113.00 cm/s MV A velocity: 154.00 cm/s  SHUNTS MV E/A ratio:  0.73         Systemic VTI:  0.27 m                             Systemic Diam: 2.00 cm Rozann Lesches MD Electronically signed by Rozann Lesches MD Signature Date/Time: 05/11/2021/1:05:20 PM    Final    CT HEAD CODE STROKE WO CONTRAST  Result Date: 05/20/2021 CLINICAL DATA:  Code stroke. Neuro deficit, acute, stroke suspected. EXAM: CT HEAD WITHOUT CONTRAST TECHNIQUE: Contiguous axial images were obtained from the base of the skull through the vertex without intravenous contrast. COMPARISON:  Head CT 06/10/2007. FINDINGS: Brain: Mild generalized cerebral atrophy, not unexpected for age. There is no acute intracranial hemorrhage. No demarcated cortical infarct. No extra-axial fluid collection. No evidence of an intracranial mass. No midline shift. Vascular: No hyperdense vessel.  Atherosclerotic calcifications. Skull: Normal. Negative for fracture or focal lesion. Sinuses/Orbits: Visualized orbits show no acute finding. Mild mucosal thickening within the bilateral ethmoid and right sphenoid sinuses. ASPECTS ([place] Stroke Program Early CT Score) - Ganglionic level infarction (caudate, lentiform nuclei, internal capsule, insula, M1-M3 cortex): 7 - Supraganglionic infarction (M4-M6 cortex): 3 Total score (0-10 with 10 being normal): 10 These results were communicated to Dr. Rory Percy At 11:09 amon 12/9/2022by text page via the [facility] messaging system. IMPRESSION: No evidence of acute intracranial abnormality. ASPECTS is 10. Mild generalized cerebral atrophy. Mild bilateral ethmoid and right maxillary sinus mucosal thickening. Electronically Signed   By: Kellie Simmering D.O.   On: 05/20/2021 11:11   VAS [place] LOWER EXTREMITY VENOUS (DVT)  Result Date: 05/22/2021  Lower Venous DVT Study Patient Name:  KENZEL RUESCH  Date of Exam:   05/22/2021 Medical Rec #:  [id number]        Accession #:    [id number] Date of Birth: Oct 07, 1940       Patient Gender: M Patient Age:   14 years Exam Location:  [facility] Procedure:      VAS [place] LOWER EXTREMITY VENOUS (DVT) Referring Phys: Noemi Chapel --------------------------------------------------------------------------------  Indications: Edema.  Comparison Study: No prior study Performing Technologist: Sharion Dove RVS  Examination Guidelines: A complete evaluation includes B-mode imaging, spectral Doppler, color Doppler, and power Doppler as needed of all accessible portions of each vessel. Bilateral testing is considered an integral part of a complete examination. Limited examinations for reoccurring indications may be performed as noted. The reflux portion of the exam is performed with the patient in reverse Trendelenburg.  +---------+---------------+---------+-----------+----------+--------------+  RIGHT     Compressibility Phasicity Spontaneity Properties Thrombus Aging  +---------+---------------+---------+-----------+----------+--------------+  CFV       Full            Yes       Yes                                    +---------+---------------+---------+-----------+----------+--------------+  SFJ       Full                                                             +---------+---------------+---------+-----------+----------+--------------+  FV Prox   Full                                                             +---------+---------------+---------+-----------+----------+--------------+  FV Mid    Full                                                             +---------+---------------+---------+-----------+----------+--------------+  FV Distal Full                                                             +---------+---------------+---------+-----------+----------+--------------+  PFV       Full                                                              +---------+---------------+---------+-----------+----------+--------------+  POP       Full            Yes       Yes                                    +---------+---------------+---------+-----------+----------+--------------+  PTV       Full                                                             +---------+---------------+---------+-----------+----------+--------------+  PERO      Full                                                             +---------+---------------+---------+-----------+----------+--------------+   +---------+---------------+---------+-----------+----------+--------------+  LEFT      Compressibility Phasicity Spontaneity Properties Thrombus Aging  +---------+---------------+---------+-----------+----------+--------------+  CFV       Full            Yes       Yes                                    +---------+---------------+---------+-----------+----------+--------------+  SFJ       Full                                                             +---------+---------------+---------+-----------+----------+--------------+  FV Prox   Full                                                             +---------+---------------+---------+-----------+----------+--------------+  FV Mid    Full                                                             +---------+---------------+---------+-----------+----------+--------------+  FV Distal Full                                                             +---------+---------------+---------+-----------+----------+--------------+  PFV       Full                                                             +---------+---------------+---------+-----------+----------+--------------+  POP       Full            Yes       Yes                                    +---------+---------------+---------+-----------+----------+--------------+  PTV       Full                                                              +---------+---------------+---------+-----------+----------+--------------+  PERO      Full                                                             +---------+---------------+---------+-----------+----------+--------------+     Summary: BILATERAL: - No evidence of deep vein thrombosis seen in the lower extremities, bilaterally. -No evidence of popliteal cyst, bilaterally.   *See table(s) above for measurements and observations. Electronically signed by Harold Barban MD on 05/22/2021 at 7:39:58 PM.    Final    CT ANGIO HEAD NECK W  WO CM W PERF (CODE STROKE)  Result Date: 05/20/2021 CLINICAL DATA:  Stroke suspected EXAM: CT ANGIOGRAPHY HEAD AND NECK TECHNIQUE: Multidetector CT imaging of the head and neck was performed using the standard protocol during bolus administration of intravenous contrast. Multiplanar CT image reconstructions and MIPs were obtained to evaluate the vascular anatomy. Carotid stenosis measurements (when applicable) are obtained utilizing NASCET criteria, using the distal internal carotid diameter as the denominator. CONTRAST:  52mL OMNIPAQUE IOHEXOL 350 MG/ML SOLN COMPARISON:  No prior CTA, correlation is made with same day CT head. FINDINGS: CT HEAD FINDINGS For noncontrast findings, please see same day CT head. CTA NECK FINDINGS Aortic arch: Standard branching. Imaged portion shows no evidence of aneurysm or dissection. No significant stenosis of the major arch vessel origins. Aortic atherosclerosis. The main pulmonary artery measures up to 3.4 cm, above the upper limit of normal, as can be seen in pulmonary hypertension. Right carotid system: No evidence of dissection, stenosis (50% or greater) or occlusion. Left carotid system: Approximately 50% stenosis in the proximal left internal carotid artery, secondary to calcified plaque. No occlusion or dissection. Vertebral arteries: Right dominant. No evidence of dissection, stenosis (50% or greater) or occlusion. Scattered calcifications  in the V1 and V2 segments Skeleton: No acute osseous abnormality. Degenerative changes in the cervical spine. Other neck: The patient is intubated.  Otherwise negative. Upper chest: Atelectasis.  Emphysema. Review of the MIP images confirms the above findings CTA HEAD FINDINGS Anterior circulation: Both internal carotid arteries are patent to the termini, with mild calcifications but without significant stenosis. A1 segments patent. Normal anterior communicating artery. Anterior cerebral arteries are patent to their distal aspects. No M1 stenosis or occlusion. Normal MCA bifurcations. Distal MCA branches perfused and symmetric. Posterior circulation: Vertebral arteries patent to the vertebrobasilar junction without stenosis. Posterior inferior cerebral arteries patent bilaterally. Basilar patent to its distal aspect. Superior cerebellar arteries patent bilaterally. PCAs perfused to their distal aspects without stenosis. The bilateral posterior communicating arteries are not visualized. Venous sinuses: As permitted by contrast timing, patent. Anatomic variants: None significant. Review of the MIP images confirms the above findings IMPRESSION: 1. No intracranial large vessel occlusion or significant stenosis. 2. Approximately 50% stenosis in the proximal left ICA. No other hemodynamically significant stenosis in the neck. 3. Enlargement of the main pulmonary artery, as can be seen in the setting of pulmonary hypertension. 4. Aortic Atherosclerosis (ICD10-I70.0) and Emphysema (ICD10-J43.9). Electronically Signed   By: Merilyn Baba M.D.   On: 05/20/2021 11:36        Discharge Exam: Vitals:   05/31/21 0349 05/31/21 0825  BP:    Pulse:    Resp:    Temp: (!) 97.3 F (36.3 C)   SpO2:  98%   Vitals:   05/30/21 2328 05/31/21 0342 05/31/21 0349 05/31/21 0825  BP: (!) 131/109     Pulse: 64 64    Resp: (!) 25 (!) 22    Temp: (!) 97.4 F (36.3 C)  (!) 97.3 F (36.3 C)   TempSrc: Axillary  Axillary    SpO2: 98% 98%  98%  Weight:   91.8 kg   Height:        General: Pt is alert, awake, not in acute distress Cardiovascular: RRR, S1/S2 +, no rubs, no gallops Respiratory: bibasilar rales. No wheeze Abdominal: Soft, NT, ND, bowel sounds + Extremities: no edema, no cyanosis   The results of significant diagnostics from this hospitalization (including imaging, microbiology, ancillary and laboratory) are listed below for  reference.    Significant Diagnostic Studies: CT ABDOMEN PELVIS WO CONTRAST  Result Date: 05/20/2021 CLINICAL DATA:  Status post cardiac catheterization this morning, evaluate for retroperitoneal bleed EXAM: CT ABDOMEN AND PELVIS WITHOUT CONTRAST TECHNIQUE: Multidetector CT imaging of the abdomen and pelvis was performed following the standard protocol without IV contrast. COMPARISON:  05/19/2021 FINDINGS: Lower chest: Aortic valve calcifications. Coronary artery calcifications. Small bilateral pleural effusions, increased compared to prior examination. Hepatobiliary: No focal liver abnormality is seen. Status post cholecystectomy. No biliary dilatation. Pancreas: Unremarkable. No pancreatic ductal dilatation or surrounding inflammatory changes. Spleen: Normal in size without significant abnormality. Adrenals/Urinary Tract: Adrenal glands are unremarkable. Excreted contrast in the collecting systems and urinary bladder, in keeping with same-day coronary catheterization. Kidneys are otherwise normal, without renal calculi, solid lesion, or hydronephrosis. Bladder is unremarkable. Stomach/Bowel: Stomach is within normal limits. Appendix appears normal. No evidence of bowel wall thickening, distention, or inflammatory changes. Vascular/Lymphatic: Aortic atherosclerosis. Status post graft repair of the abdominal aorta. No enlarged abdominal or pelvic lymph nodes. Reproductive: No mass or other significant abnormality. Other: No abdominal wall hernia or abnormality. Subcutaneous soft tissue  stranding and pressure dressing overlying the right femoral vessels (series 3, image 89). No abdominopelvic ascites. Musculoskeletal: No acute or significant osseous findings. Chronic pars defects of L5. IMPRESSION: 1. No evidence of retroperitoneal hematoma or other acute noncontrast CT findings in the abdomen or pelvis. 2. Subcutaneous soft tissue stranding and pressure dressing overlying the right femoral vessels, in keeping with catheterization. 3. Small bilateral pleural effusions, increased compared to prior examination. 4. Status post graft repair of the abdominal aorta. Aortic Atherosclerosis (ICD10-I70.0). Electronically Signed   By: Delanna Ahmadi M.D.   On: 05/20/2021 11:34   DG Chest 1 View  Result Date: 05/19/2021 CLINICAL DATA:  Shortness of breath. EXAM: CHEST  1 VIEW COMPARISON:  Chest x-ray from same day at 1222 hours. FINDINGS: Stable cardiomegaly. Unchanged bibasilar atelectasis/scarring. Linear density overlying the peripheral right lung corresponds to a prominent lung marking on CT from same day. No focal consolidation, pleural effusion, or pneumothorax. No acute osseous abnormality. IMPRESSION: 1. Linear density overlying the peripheral right lung corresponds to a prominent lung marking. No pneumothorax. Electronically Signed   By: Titus Dubin M.D.   On: 05/19/2021 14:41   DG Abd 1 View  Result Date: 05/20/2021 CLINICAL DATA:  OG tube placement. EXAM: ABDOMEN - 1 VIEW COMPARISON:  10/14/2019 FINDINGS: 1153 hours. OG tube tip is at the esophagogastric junction with proximal side port in the distal esophagus. Diffuse gaseous distention of small bowel and colon evident. IMPRESSION: OG tube tip is at the esophagogastric junction with proximal side port in the distal esophagus. Tube could be advanced 9-10 cm to place the proximal side port below the GE junction. Electronically Signed   By: Misty Stanley M.D.   On: 05/20/2021 12:04   CT Angio Chest Pulmonary Embolism (PE) W or WO  Contrast  Result Date: 05/21/2021 CLINICAL DATA:  High probability for pulmonary embolism.  The Pneumonia EXAM: CT ANGIOGRAPHY CHEST WITH CONTRAST TECHNIQUE: Multidetector CT imaging of the chest was performed using the standard protocol during bolus administration of intravenous contrast. Multiplanar CT image reconstructions and MIPs were obtained to evaluate the vascular anatomy. CONTRAST:  167mL OMNIPAQUE IOHEXOL 350 MG/ML SOLN COMPARISON:  None. FINDINGS: Cardiovascular: The heart is mildly enlarged. Coronary artery calcifications are noted. No pulmonary artery emboli. Mediastinum/Nodes: No enlarged mediastinal, hilar, or axillary lymph nodes. Thyroid gland, trachea, and esophagus demonstrate no significant  findings. ET tube in appropriate position. NG tube terminates in the proximal stomach. Lungs/Pleura: Trace bilateral pleural effusions with mild adjacent atelectasis. No focal airspace opacity to indicate pneumonia. 4 mm nodule seen in the right upper lobe (46/6). Upper Abdomen: No acute abnormality. Musculoskeletal: No chest wall abnormality. No acute or significant osseous findings. Review of the MIP images confirms the above findings. IMPRESSION: 1. No pulmonary artery embolism. 2. Trace bilateral pleural effusions with mild adjacent atelectasis. 3. 4 mm right upper lobe noncalcified pulmonary nodule (46/6). No follow-up needed if patient is low-risk. Non-contrast chest CT can be considered in 12 months if patient is high-risk. This recommendation follows the consensus statement: Guidelines for Management of Incidental Pulmonary Nodules Detected on CT Images: From the Fleischner Society 2017; Radiology 2017; 284:228-243. Electronically Signed   By: Miachel Roux M.D.   On: 05/21/2021 09:56   CT ABDOMEN PELVIS W CONTRAST  Result Date: 05/19/2021 CLINICAL DATA:  Acute generalized abdominal pain. EXAM: CT ABDOMEN AND PELVIS WITH CONTRAST TECHNIQUE: Multidetector CT imaging of the abdomen and pelvis was  performed using the standard protocol following bolus administration of intravenous contrast. CONTRAST:  111mL OMNIPAQUE IOHEXOL 300 MG/ML  SOLN COMPARISON:  September 23, 2019. FINDINGS: Lower chest: Mild bibasilar subsegmental atelectasis is noted. Hepatobiliary: No focal liver abnormality is seen. Status post cholecystectomy. No biliary dilatation. Pancreas: Unremarkable. No pancreatic ductal dilatation or surrounding inflammatory changes. Spleen: Normal in size without focal abnormality. Adrenals/Urinary Tract: Adrenal glands appear normal. Right renal cyst is noted. No hydronephrosis or renal obstruction is noted. No renal or ureteral calculi are noted. Urinary bladder is unremarkable. Stomach/Bowel: Stomach is within normal limits. Appendix appears normal. No evidence of bowel wall thickening, distention, or inflammatory changes. Vascular/Lymphatic: Status post surgical repair of abdominal aorta. No adenopathy is noted. Reproductive: Prostate is unremarkable. Other: No abdominal wall hernia or abnormality. No abdominopelvic ascites. Musculoskeletal: Minimal grade 1 anterolisthesis is noted secondary to bilateral L5 spondylolysis. IMPRESSION: No acute abnormality seen in the abdomen or pelvis. Aortic Atherosclerosis (ICD10-I70.0). Electronically Signed   By: Marijo Conception M.D.   On: 05/19/2021 13:33   CARDIAC CATHETERIZATION  Result Date: 05/20/2021   Ost LAD to Prox LAD lesion is 40% stenosed.  Prox LAD to Mid LAD overlapping stent segment is mostly widely patent with 5% in-stent stenosis   Mid LAD lesion is 50% stenosed.   Ramus lesion is 40% stenosed.   Mild to moderate diffuse disease elsewhere.  Codominant system.   LV end diastolic pressure is mildly elevated.   There is moderate aortic valve stenosis. SUMMARY Stable single-vessel disease with widely patent LAD stents and moderate mid LAD 50% stenosis. No obvious culprit lesion to explain non-STEMI. Mildly elevated LVEDP with normal EF by echo. Moderate  aortic stenosis by echo with mean gradient of roughly 20 mmHg by cath. RECOMMENDATIONS Suspect demand ischemia with ongoing illness Continue medical management. Okay to discontinue heparin Glenetta Hew, MD  DG Chest [facility] 1 View  Result Date: 05/29/2021 CLINICAL DATA:  Low oxygen saturation., weakness. EXAM: PORTABLE CHEST 1 VIEW COMPARISON:  CT examination dated May 21, 2021 FINDINGS: The heart is enlarged. Aortic arch atherosclerotic calcifications. Low lung volumes. No focal consolidation. Chronic interstitial markings. IMPRESSION: 1. Low lung volumes without evidence of focal consolidation or large pleural effusion. 2.  Cardiomegaly. Electronically Signed   By: Keane Police D.O.   On: 05/29/2021 10:22   DG Chest Port 1 View  Result Date: 05/21/2021 CLINICAL DATA:  Pneumonia. EXAM: PORTABLE CHEST  1 VIEW COMPARISON:  May 20, 2021. FINDINGS: Stable cardiomediastinal silhouette. Endotracheal and nasogastric tubes are unchanged. Stable bibasilar subsegmental atelectasis is noted. Bony thorax is unremarkable. IMPRESSION: Stable support apparatus. Stable bibasilar subsegmental atelectasis. Electronically Signed   By: Marijo Conception M.D.   On: 05/21/2021 09:28   DG CHEST PORT 1 VIEW  Result Date: 05/20/2021 CLINICAL DATA:  Endotracheal tube placement. EXAM: PORTABLE CHEST 1 VIEW COMPARISON:  Same day. FINDINGS: Stable cardiomegaly. Endotracheal and nasogastric tubes are unchanged in position. Stable bilateral lung opacities are noted concerning for atelectasis or possibly infiltrates. Bony thorax is unremarkable. IMPRESSION: Stable support apparatus.  Stable bibasilar opacities. Electronically Signed   By: Marijo Conception M.D.   On: 05/20/2021 12:05   DG CHEST PORT 1 VIEW  Result Date: 05/20/2021 CLINICAL DATA:  80 year old male with history of non ST elevation myocardial infarction. EXAM: PORTABLE CHEST 1 VIEW COMPARISON:  Chest x-ray 05/19/2021. FINDINGS: An endotracheal tube is in place  with tip 3.2 cm above the carina. Transcutaneous defibrillator pad projecting over the mid thorax. Lung volumes are low. Bibasilar opacities (left greater than right) may reflect areas of atelectasis and/or consolidation. Small left pleural effusion. No definite right pleural effusion. No pneumothorax. Nodular density projecting over the right upper lobe, not readily apparent on the recent prior examination, potentially artifact, measuring 1.4 cm in diameter. Mild diffuse interstitial prominence and cephalization of the pulmonary vasculature, suggesting a background of mild interstitial pulmonary edema. Heart size is mildly enlarged. The patient is rotated to the left on today's exam, resulting in distortion of the mediastinal contours and reduced diagnostic sensitivity and specificity for mediastinal pathology. Atherosclerotic calcifications in the thoracic aorta. IMPRESSION: 1. Support apparatus, as above. 2. The appearance the chest suggests mild congestive heart failure, as above. 3. Bibasilar opacities which may reflect areas of atelectasis and/or consolidation. 4. New nodular density projecting over the region of the right upper lobe, potentially artifactual. Close attention on follow-up studies is recommended to ensure resolution of this finding. 5. Aortic atherosclerosis. Electronically Signed   By: Vinnie Langton M.D.   On: 05/20/2021 10:58   DG Chest Portable 1 View  Result Date: 05/19/2021 CLINICAL DATA:  Shortness of breath EXAM: PORTABLE CHEST 1 VIEW COMPARISON:  Previous studies including the examination of 10/15/2019 FINDINGS: Transverse diameter of heart is increased. There are no signs of pulmonary edema. There are linear densities in medial left lower lung fields. Left lateral CP angle is indistinct. There is no definite pneumothorax. Faint linear density seen in the lateral aspect of right lower lung fields may suggest pleural thickening. Less likely possibility would be loculated right  pneumothorax. IMPRESSION: Cardiomegaly. Increased markings in the medial lower lung fields may suggest scarring or subsegmental atelectasis. There are no new focal infiltrates. There is faint linear radiopacity in the lateral aspect of right lower lung fields which may be due to pleural thickening or suggest small loculated right pneumothorax. There is no demonstrable right apical pneumothorax. Repeat PA views of chest in the inspiration and expiration may be considered. Electronically Signed   By: Elmer Picker M.D.   On: 05/19/2021 12:42   ECHOCARDIOGRAM COMPLETE  Result Date: 05/11/2021    ECHOCARDIOGRAM REPORT   Patient Name:   [name] Date of Exam: 05/11/2021 Medical Rec #:  [id number]       Height:       66.0 in Accession #:    [id number]      Weight:  197.0 lb Date of Birth:  1940/12/25      BSA:          1.987 m Patient Age:    67 years        BP:           159/80 mmHg Patient Gender: M               HR:           90 bpm. Exam Location:  [facility] Procedure: 2D Echo, Cardiac Doppler and Color Doppler Indications:    I35.0 (ICD-10-CM) - Nonrheumatic aortic valve stenosis  History:        Patient has prior history of Echocardiogram examinations, most                 recent 05/14/2020. CAD, Aortic Valve Disease,                 Signs/Symptoms:Murmur; Risk Factors:Hypertension and                 Dyslipidemia. GERD.  Sonographer:    Alvino Chapel RCS Referring Phys: [place]  1. Left ventricular ejection fraction, by estimation, is 65 to 70%. The left ventricle has normal function. The left ventricle has no regional wall motion abnormalities. Left ventricular diastolic parameters are consistent with Grade I diastolic dysfunction (impaired relaxation). Elevated left ventricular end-diastolic pressure.  2. Right ventricular systolic function is normal. The right ventricular size is mildly enlarged. There is normal pulmonary artery systolic pressure. The estimated  right ventricular systolic pressure is 03.5 mmHg.  3. Left atrial size was mildly dilated.  4. The mitral valve is abnormal. Trivial mitral valve regurgitation. Moderate mitral annular calcification.  5. The aortic valve is tricuspid. There is severe calcifcation of the aortic valve. Aortic valve regurgitation is trivial. Moderate aortic valve stenosis. Aortic valve mean gradient measures 21.0 mmHg. Dimentionless index 21 mmHg.  6. The inferior vena cava is normal in size with greater than 50% respiratory variability, suggesting right atrial pressure of 3 mmHg. Comparison(s): Prior images reviewed side by side. Aortic stenosis is in moderate range with mean gradient 21 mmHg (previoiusly 18 mmHg). FINDINGS  Left Ventricle: Left ventricular ejection fraction, by estimation, is 65 to 70%. The left ventricle has normal function. The left ventricle has no regional wall motion abnormalities. The left ventricular internal cavity size was normal in size. There is  borderline left ventricular hypertrophy. Left ventricular diastolic parameters are consistent with Grade I diastolic dysfunction (impaired relaxation). Elevated left ventricular end-diastolic pressure. Right Ventricle: The right ventricular size is mildly enlarged. No increase in right ventricular wall thickness. Right ventricular systolic function is normal. There is normal pulmonary artery systolic pressure. The tricuspid regurgitant velocity is 2.46  m/s, and with an assumed right atrial pressure of 3 mmHg, the estimated right ventricular systolic pressure is 59.7 mmHg. Left Atrium: Left atrial size was mildly dilated. Right Atrium: Right atrial size was normal in size. Pericardium: There is no evidence of pericardial effusion. Mitral Valve: The mitral valve is abnormal. Moderate mitral annular calcification. Trivial mitral valve regurgitation. Tricuspid Valve: The tricuspid valve is grossly normal. Tricuspid valve regurgitation is trivial. Aortic Valve: The  aortic valve is tricuspid. There is severe calcifcation of the aortic valve. There is mild to moderate aortic valve annular calcification. Aortic valve regurgitation is trivial. Moderate aortic stenosis is present. Aortic valve mean gradient measures 21.0 mmHg. Aortic valve peak gradient measures 39.4 mmHg. Aortic valve  area, by VTI measures 1.41 cm. Pulmonic Valve: The pulmonic valve was grossly normal. Pulmonic valve regurgitation is trivial. Aorta: The aortic root is normal in size and structure. Venous: The inferior vena cava is normal in size with greater than 50% respiratory variability, suggesting right atrial pressure of 3 mmHg. IAS/Shunts: No atrial level shunt detected by color flow Doppler.  LEFT VENTRICLE PLAX 2D LVIDd:         5.20 cm   Diastology LVIDs:         2.80 cm   LV e' medial:    5.22 cm/s LV PW:         1.00 cm   LV E/e' medial:  21.6 LV IVS:        1.00 cm   LV e' lateral:   6.96 cm/s LVOT diam:     2.00 cm   LV E/e' lateral: 16.2 LV SV:         84 LV SV Index:   42 LVOT Area:     3.14 cm  RIGHT VENTRICLE RV S prime:     17.40 cm/s TAPSE (M-mode): 2.2 cm LEFT ATRIUM             Index LA diam:        5.30 cm 2.67 cm/m LA Vol (A2C):   51.3 ml 25.82 ml/m LA Vol (A4C):   77.0 ml 38.75 ml/m LA Biplane Vol: 67.8 ml 34.12 ml/m  AORTIC VALVE AV Area (Vmax):    1.26 cm AV Area (Vmean):   1.34 cm AV Area (VTI):     1.41 cm AV Vmax:           314.00 cm/s AV Vmean:          207.000 cm/s AV VTI:            0.595 m AV Peak Grad:      39.4 mmHg AV Mean Grad:      21.0 mmHg LVOT Vmax:         126.00 cm/s LVOT Vmean:        88.500 cm/s LVOT VTI:          0.267 m LVOT/AV VTI ratio: 0.45  AORTA Ao Root diam: 3.50 cm MITRAL VALVE                TRICUSPID VALVE MV Area (PHT): 2.37 cm     TR Peak grad:   24.2 mmHg MV Decel Time: 320 msec     TR Vmax:        246.00 cm/s MV E velocity: 113.00 cm/s MV A velocity: 154.00 cm/s  SHUNTS MV E/A ratio:  0.73         Systemic VTI:  0.27 m                              Systemic Diam: 2.00 cm Rozann Lesches MD Electronically signed by Rozann Lesches MD Signature Date/Time: 05/11/2021/1:05:20 PM    Final    CT HEAD CODE STROKE WO CONTRAST  Result Date: 05/20/2021 CLINICAL DATA:  Code stroke. Neuro deficit, acute, stroke suspected. EXAM: CT HEAD WITHOUT CONTRAST TECHNIQUE: Contiguous axial images were obtained from the base of the skull through the vertex without intravenous contrast. COMPARISON:  Head CT 06/10/2007. FINDINGS: Brain: Mild generalized cerebral atrophy, not unexpected for age. There is no acute intracranial hemorrhage. No demarcated cortical infarct. No extra-axial fluid collection. No evidence of an intracranial mass.  No midline shift. Vascular: No hyperdense vessel.  Atherosclerotic calcifications. Skull: Normal. Negative for fracture or focal lesion. Sinuses/Orbits: Visualized orbits show no acute finding. Mild mucosal thickening within the bilateral ethmoid and right sphenoid sinuses. ASPECTS ([place] Stroke Program Early CT Score) - Ganglionic level infarction (caudate, lentiform nuclei, internal capsule, insula, M1-M3 cortex): 7 - Supraganglionic infarction (M4-M6 cortex): 3 Total score (0-10 with 10 being normal): 10 These results were communicated to Dr. Rory Percy At 11:09 amon 12/9/2022by text page via the [facility] messaging system. IMPRESSION: No evidence of acute intracranial abnormality. ASPECTS is 10. Mild generalized cerebral atrophy. Mild bilateral ethmoid and right maxillary sinus mucosal thickening. Electronically Signed   By: Kellie Simmering D.O.   On: 05/20/2021 11:11   VAS [place] LOWER EXTREMITY VENOUS (DVT)  Result Date: 05/22/2021  Lower Venous DVT Study Patient Name:  [name]  Date of Exam:   05/22/2021 Medical Rec #: [id number]        Accession #:    [id number] Date of Birth: [birth year]       Patient Gender: M Patient Age:   65 years Exam Location:  [facility] Procedure:      VAS [place] LOWER EXTREMITY VENOUS (DVT) Referring Phys:  Noemi Chapel --------------------------------------------------------------------------------  Indications: Edema.  Comparison Study: No prior study Performing Technologist: Sharion Dove RVS  Examination Guidelines: A complete evaluation includes B-mode imaging, spectral Doppler, color Doppler, and power Doppler as needed of all accessible portions of each vessel. Bilateral testing is considered an integral part of a complete examination. Limited examinations for reoccurring indications may be performed as noted. The reflux portion of the exam is performed with the patient in reverse Trendelenburg.  +---------+---------------+---------+-----------+----------+--------------+  RIGHT     Compressibility Phasicity Spontaneity Properties Thrombus Aging  +---------+---------------+---------+-----------+----------+--------------+  CFV       Full            Yes       Yes                                    +---------+---------------+---------+-----------+----------+--------------+  SFJ       Full                                                             +---------+---------------+---------+-----------+----------+--------------+  FV Prox   Full                                                             +---------+---------------+---------+-----------+----------+--------------+  FV Mid    Full                                                             +---------+---------------+---------+-----------+----------+--------------+  FV Distal Full                                                             +---------+---------------+---------+-----------+----------+--------------+  PFV       Full                                                             +---------+---------------+---------+-----------+----------+--------------+  POP       Full            Yes       Yes                                    +---------+---------------+---------+-----------+----------+--------------+  PTV       Full                                                              +---------+---------------+---------+-----------+----------+--------------+  PERO      Full                                                             +---------+---------------+---------+-----------+----------+--------------+   +---------+---------------+---------+-----------+----------+--------------+  LEFT      Compressibility Phasicity Spontaneity Properties Thrombus Aging  +---------+---------------+---------+-----------+----------+--------------+  CFV       Full            Yes       Yes                                    +---------+---------------+---------+-----------+----------+--------------+  SFJ       Full                                                             +---------+---------------+---------+-----------+----------+--------------+  FV Prox   Full                                                             +---------+---------------+---------+-----------+----------+--------------+  FV Mid    Full                                                             +---------+---------------+---------+-----------+----------+--------------+  FV Distal Full                                                             +---------+---------------+---------+-----------+----------+--------------+  PFV       Full                                                             +---------+---------------+---------+-----------+----------+--------------+  POP       Full            Yes       Yes                                    +---------+---------------+---------+-----------+----------+--------------+  PTV       Full                                                             +---------+---------------+---------+-----------+----------+--------------+  PERO      Full                                                             +---------+---------------+---------+-----------+----------+--------------+     Summary: BILATERAL: - No evidence of deep vein thrombosis seen in the lower extremities,  bilaterally. -No evidence of popliteal cyst, bilaterally.   *See table(s) above for measurements and observations. Electronically signed by Harold Barban MD on 05/22/2021 at 7:39:58 PM.    Final    CT ANGIO HEAD NECK W WO CM W PERF (CODE STROKE)  Result Date: 05/20/2021 CLINICAL DATA:  Stroke suspected EXAM: CT ANGIOGRAPHY HEAD AND NECK TECHNIQUE: Multidetector CT imaging of the head and neck was performed using the standard protocol during bolus administration of intravenous contrast. Multiplanar CT image reconstructions and MIPs were obtained to evaluate the vascular anatomy. Carotid stenosis measurements (when applicable) are obtained utilizing NASCET criteria, using the distal internal carotid diameter as the denominator. CONTRAST:  40mL OMNIPAQUE IOHEXOL 350 MG/ML SOLN COMPARISON:  No prior CTA, correlation is made with same day CT head. FINDINGS: CT HEAD FINDINGS For noncontrast findings, please see same day CT head. CTA NECK FINDINGS Aortic arch: Standard branching. Imaged portion shows no evidence of aneurysm or dissection. No significant stenosis of the major arch vessel origins. Aortic atherosclerosis. The main pulmonary artery measures up to 3.4 cm, above the upper limit of normal, as can be seen in pulmonary hypertension. Right carotid system: No evidence of dissection, stenosis (50% or greater) or occlusion. Left carotid system: Approximately 50% stenosis in the proximal left internal carotid artery, secondary to calcified plaque. No occlusion or dissection. Vertebral arteries: Right dominant. No evidence of dissection, stenosis (50% or greater) or occlusion. Scattered calcifications in the V1 and V2 segments Skeleton: No acute osseous abnormality. Degenerative changes in the cervical spine. Other neck: The patient is intubated.  Otherwise negative. Upper chest: Atelectasis.  Emphysema. Review of the MIP images confirms the above findings CTA HEAD FINDINGS Anterior circulation: Both internal carotid  arteries are patent to the termini, with mild calcifications but without significant stenosis. A1  segments patent. Normal anterior communicating artery. Anterior cerebral arteries are patent to their distal aspects. No M1 stenosis or occlusion. Normal MCA bifurcations. Distal MCA branches perfused and symmetric. Posterior circulation: Vertebral arteries patent to the vertebrobasilar junction without stenosis. Posterior inferior cerebral arteries patent bilaterally. Basilar patent to its distal aspect. Superior cerebellar arteries patent bilaterally. PCAs perfused to their distal aspects without stenosis. The bilateral posterior communicating arteries are not visualized. Venous sinuses: As permitted by contrast timing, patent. Anatomic variants: None significant. Review of the MIP images confirms the above findings IMPRESSION: 1. No intracranial large vessel occlusion or significant stenosis. 2. Approximately 50% stenosis in the proximal left ICA. No other hemodynamically significant stenosis in the neck. 3. Enlargement of the main pulmonary artery, as can be seen in the setting of pulmonary hypertension. 4. Aortic Atherosclerosis (ICD10-I70.0) and Emphysema (ICD10-J43.9). Electronically Signed   By: Merilyn Baba M.D.   On: 05/20/2021 11:36    Microbiology: Recent Results (from the past 240 hour(s))  Resp Panel by RT-PCR (Flu A&B, Covid) Nasopharyngeal Swab     Status: None   Collection Time: 05/29/21 10:43 AM   Specimen: Nasopharyngeal Swab; Nasopharyngeal(NP) swabs in vial transport medium  Result Value Ref Range Status   SARS Coronavirus 2 by RT PCR NEGATIVE NEGATIVE Final    Comment: (NOTE) SARS-CoV-2 target nucleic acids are NOT DETECTED.  The SARS-CoV-2 RNA is generally detectable in upper respiratory specimens during the acute phase of infection. The lowest concentration of SARS-CoV-2 viral copies this assay can detect is 138 copies/mL. A negative result does not preclude SARS-Cov-2 infection  and should not be used as the sole basis for treatment or other patient management decisions. A negative result may occur with  improper specimen collection/handling, submission of specimen other than nasopharyngeal swab, presence of viral mutation(s) within the areas targeted by this assay, and inadequate number of viral copies(<138 copies/mL). A negative result must be combined with clinical observations, patient history, and epidemiological information. The expected result is Negative.  Fact Sheet for Patients:  EntrepreneurPulse.com.au  Fact Sheet for Healthcare Providers:  IncredibleEmployment.be  This test is no t yet approved or cleared by the [place] FDA and  has been authorized for detection and/or diagnosis of SARS-CoV-2 by FDA under an Emergency Use Authorization (EUA). This EUA will remain  in effect (meaning this test can be used) for the duration of the COVID-19 declaration under Section 564(b)(1) of the Act, 21 U.S.C.section 360bbb-3(b)(1), unless the authorization is terminated  or revoked sooner.       Influenza A by PCR NEGATIVE NEGATIVE Final   Influenza B by PCR NEGATIVE NEGATIVE Final    Comment: (NOTE) The Xpert Xpress SARS-CoV-2/FLU/RSV plus assay is intended as an aid in the diagnosis of influenza from Nasopharyngeal swab specimens and should not be used as a sole basis for treatment. Nasal washings and aspirates are unacceptable for Xpert Xpress SARS-CoV-2/FLU/RSV testing.  Fact Sheet for Patients: EntrepreneurPulse.com.au  Fact Sheet for Healthcare Providers: IncredibleEmployment.be  This test is not yet approved or cleared by the [place] FDA and has been authorized for detection and/or diagnosis of SARS-CoV-2 by FDA under an Emergency Use Authorization (EUA). This EUA will remain in effect (meaning this test can be used) for the duration of the COVID-19 declaration  under Section 564(b)(1) of the Act, 21 U.S.C. section 360bbb-3(b)(1), unless the authorization is terminated or revoked.  Performed at [facility], [address], [place], [place] [address]   Urine Culture  Status: None   Collection Time: 05/29/21  3:05 PM   Specimen: Urine, Clean Catch  Result Value Ref Range Status   Specimen Description   Final    URINE, CLEAN CATCH Performed at [facility], [address], [place], [place] [address]    Special Requests   Final    NONE Performed at [facility], [address], [place], [place] [address]    Culture   Final    NO GROWTH Performed at [facility], [place] [address] [place], [place] [address]    Report Status 05/30/2021 FINAL  Final  MRSA Next Gen by PCR, Nasal     Status: None   Collection Time: 05/29/21  7:45 PM   Specimen: Nasal Mucosa; Nasal Swab  Result Value Ref Range Status   MRSA by PCR Next Gen NOT DETECTED NOT DETECTED Final    Comment: (NOTE) The GeneXpert MRSA Assay (FDA approved for NASAL specimens only), is one component of a comprehensive MRSA colonization surveillance program. It is not intended to diagnose MRSA infection nor to guide or monitor treatment for MRSA infections. Test performance is not FDA approved in patients less than 34 years old. Performed at [facility], [address], [place], [place] [address]      Labs: Basic Metabolic Panel: Recent Labs  Lab 05/29/21 1018 05/30/21 0440  NA 138 140  K 4.2 3.8  CL 96* 94*  CO2 37* 38*  GLUCOSE 171* 123*  BUN 19 21  CREATININE 0.50* 0.55*  CALCIUM 8.6* 8.8*   Liver Function Tests: Recent Labs  Lab 05/29/21 1018 05/30/21 0440  AST 21 20  ALT 61* 48*  ALKPHOS 69 60  BILITOT 0.3 0.6  PROT 7.4 6.7  ALBUMIN 3.7 3.4*   No results for input(s): LIPASE, AMYLASE in the last 168 hours. Recent Labs  Lab 05/29/21 1414  AMMONIA 36*   CBC: Recent Labs  Lab 05/29/21 1018 05/30/21 0440  WBC 9.5 11.1*  NEUTROABS 5.8  --   HGB  9.6* 8.8*  HCT 37.9* 33.6*  MCV 85.0 83.8  PLT 439* 443*   Cardiac Enzymes: No results for input(s): CKTOTAL, CKMB, CKMBINDEX, TROPONINI in the last 168 hours. BNP: Invalid input(s): POCBNP CBG: Recent Labs  Lab 05/26/21 1155 05/26/21 1648 05/26/21 2114 05/27/21 0757 05/27/21 1139  GLUCAP 142* 154* 110* 146* 177*    Time coordinating discharge:  36 minutes  Signed:  Orson Eva, DO [facility] Pager: [phone] 05/31/2021, 9:06 AM

## 2021-05-31 ENCOUNTER — Telehealth: Payer: Self-pay

## 2021-05-31 DIAGNOSIS — J449 Chronic obstructive pulmonary disease, unspecified: Secondary | ICD-10-CM | POA: Diagnosis not present

## 2021-05-31 DIAGNOSIS — J9610 Chronic respiratory failure, unspecified whether with hypoxia or hypercapnia: Secondary | ICD-10-CM | POA: Diagnosis not present

## 2021-05-31 DIAGNOSIS — M6281 Muscle weakness (generalized): Secondary | ICD-10-CM

## 2021-05-31 DIAGNOSIS — B91 Sequelae of poliomyelitis: Secondary | ICD-10-CM

## 2021-05-31 NOTE — Telephone Encounter (Signed)
-----   Message from Chesley Mires, MD sent at 05/31/2021  3:30 PM EST ----- [name] was discharged from hospital today.  He needs follow up with me next month in [place] office.  Okay to double book visit.  Thanks.  V

## 2021-05-31 NOTE — Progress Notes (Signed)
SATURATION QUALIFICATIONS: (This note is used to comply with regulatory documentation for home oxygen)  Patient Saturations on Room Air at Rest = 87%  Patient Saturations on Room Air while Ambulating = 83%  Patient Saturations on 2 Liters of oxygen while Ambulating = 94%  Please briefly explain why patient needs home oxygen: Patient oxygen drops into the 80's while on room air at rest and room air while ambulating

## 2021-05-31 NOTE — Progress Notes (Signed)
Discharge paperwork and instructions provided to patient and patient spouse. Patient's home oxygen applied. All belongings returned to pt's satisfaction. NT assisted patient with dressing and removed PIV. Site clean, dry, and intact. Pt transported via wheelchair to main entrance with NT.

## 2021-05-31 NOTE — Evaluation (Addendum)
Occupational Therapy Evaluation Patient Details Name: [name] MRN: [id number] DOB: [birth year] Today's Date: 05/31/2021   History of Present Illness [name] is a 80 y.o. male presents from [facility] with somnolence and hypoxia. Pt admitted 05/29/21 with acute on chronic resp failure with hypoxia and hypercarbia. The patient had a recent hospitalization from 05/19/2021 to 05/27/2021 when he was treated for NSTEMI; heart cath 12/9. PMH: gastric AVMs, bladder cancer s/p TURBT, HTN, hyperlipidemia, CAD s/p PCI to the LAD 2009, AAA s/p repair 10/14/19, post polio syndrome   Clinical Impression   Pt agreeable to OT evaluation. Pt donning supplemental O2 during session. Pt seated in chair upon therapist arrival. Pt demonstrates mostly flaccid R UE due to post polio syndrome at baseline. Pt is noted to have a subluxation in the R UE. Pt provided R UE sling with education to keep sling at 53* to assist with supporting R UE to prevent further subluxation. Pt demonstrated functional mobility with supervision today with mild unsteady balance peaking upon initial sit to stand. Pt used quad cane this date with fair balance during functional transfers. Pt is not recommended for further acute OT services and will be discharged to care of nursing staff for remaining length of stay.      Recommendations for follow up therapy are one component of a multi-disciplinary discharge planning process, led by the attending physician.  Recommendations may be updated based on patient status, additional functional criteria and insurance authorization.   Follow Up Recommendations  Home health OT    Assistance Recommended at Discharge Intermittent Supervision/Assistance (For mobility)  Functional Status Assessment  Patient has had a recent decline in their functional status and demonstrates the ability to make significant improvements in function in a reasonable and predictable amount of time.  Equipment  Recommendations  BSC/3in1    Recommendations for Other Services       Precautions / Restrictions Precautions Precautions: Fall Restrictions Weight Bearing Restrictions: No      Mobility                 Transfers Overall transfer level: Needs assistance Equipment used: Quad cane Transfers: Sit to/from Stand;Bed to chair/wheelchair/BSC Sit to Stand: Supervision     Step pivot transfers: Supervision     General transfer comment: Mildly unsteady upon initial sit to stand. Benefited from use of quad cane.      Balance Overall balance assessment: Needs assistance Sitting-balance support: No upper extremity supported;Feet supported Sitting balance-Leahy Scale: Good Sitting balance - Comments: seated in chair   Standing balance support: During functional activity;Single extremity supported;Reliant on assistive device for balance Standing balance-Leahy Scale: Fair Standing balance comment: fair with quad cane in L UE.                           ADL either performed or assessed with clinical judgement   ADL Overall ADL's : Needs assistance/impaired     Grooming: Supervision/safety;Standing Grooming Details (indicate cue type and reason): Per clinical judgement.             Lower Body Dressing: Supervision/safety;Sitting/lateral leans Lower Body Dressing Details (indicate cue type and reason): To don and doff B socks seated in chair. Toilet Transfer: Supervision/safety;Ambulation (quad cane) Toilet Transfer Details (indicate cue type and reason): chair to [facility] ambulating in room with quad cane         Functional mobility during ADLs: Supervision/safety;Cane General ADL Comments: Mildly unsteady during intial  stand with supervision assist to ambulate in room to [facility]. Pt uses L UE for ADL tasks without functional use of R UE.     Vision Baseline Vision/History: 0 No visual deficits Ability to See in Adequate Light: 0 Adequate Patient Visual  Report: No change from baseline Vision Assessment?: No apparent visual deficits     Perception     Praxis      Pertinent Vitals/Pain Pain Assessment: No/denies pain     Hand Dominance Left   Extremity/Trunk Assessment Upper Extremity Assessment Upper Extremity Assessment: RUE deficits/detail;LUE deficits/detail RUE Deficits / Details: baseline deficits from post polio syndrome, pt reports unable to use functionally, had sling in past but does not now. R UE mostly flaccid. Pt noted to have subluxation or R UE. Pt provided R UE sling with verbal education and assist to don sling. RUE Coordination: decreased fine motor;decreased gross motor LUE Deficits / Details: Limited to ~90* A/ROM for flexion and abduction. WFL P/ROM. Pt reports A/ROM is at baseline levels.   Lower Extremity Assessment Lower Extremity Assessment: Defer to PT evaluation   Cervical / Trunk Assessment Cervical / Trunk Assessment: Kyphotic   Communication Communication Communication: HOH   Cognition Arousal/Alertness: Awake/alert Behavior During Therapy: WFL for tasks assessed/performed Overall Cognitive Status: Within Functional Limits for tasks assessed                                       General Comments       Exercises     Shoulder Instructions  Pt provided R UE sling with verbal education to keep sling at 90* to assist with preventing further subluxation.     Home Living Family/patient expects to be discharged to:: Private residence Living Arrangements: Spouse/significant other Available Help at Discharge: Family;Available 24 hours/day Type of Home: House Home Access: Stairs to enter [employer] of Steps: 4 Entrance Stairs-Rails: Can reach both Home Layout: Able to live on main level with bedroom/bathroom;Multi-level     Bathroom Shower/Tub: Occupational psychologist: Standard     Home Equipment: Grab bars - toilet;Grab bars - tub/shower;Shower  Land (2 wheels)   Additional Comments: RW is for spouse; pt reports using RUE sling in past but hasn't for years (Per PT note.)      Prior Functioning/Environment Prior Level of Function : Independent/Modified Independent             Mobility Comments: Pt reports independent mobility. ADLs Comments: pt reports ind and driving                      OT Goals(Current goals can be found in the care plan section) Acute Rehab OT Goals Patient Stated Goal: return home  OT Frequency:      End of Session Equipment Utilized During Treatment: Other (comment) (cane) Nurse Communication: Other (comment) (Informed pt transfered to toilet and back to chair. Informed pt was given a sling for R UE.)  Activity Tolerance: Patient tolerated treatment well Patient left: in chair;with call bell/phone within reach  OT Visit Diagnosis: Other abnormalities of gait and mobility (R26.89);Muscle weakness (generalized) (M62.81)                Time: 6314-9702 OT Time Calculation (min): 22 min Charges:  OT General Charges $OT Visit: 1 Visit OT Evaluation $OT Eval Low Complexity: [place] OT, MOT  Mikeal Hawthorne  Johnmichael Melhorn 05/31/2021, 9:47 AM

## 2021-05-31 NOTE — TOC Transition Note (Signed)
Transition of Care [facility]) - CM/SW Discharge Note   Patient Details  Name: [name] MRN: [id number] Date of Birth: 16-Aug-1940  Transition of Care [facility]) CM/SW Contact:  Shade Flood, LCSW Phone Number: 05/31/2021, 9:18 AM   Clinical Narrative:     Pt stable for dc today per MD. Referred Home O2 orders to [place] at [place]. Once portable O2 delivered to pt's room, he can dc. [employer] will also be providing the NIV and pt's quad cane for dc.  Updated Sarah at [facility] and they will see pt at home tomorrow.  Pt and his family remain in agreement with the dc plan.   There are no other TOC needs identified for dc.  Final next level of care: [place] Barriers to Discharge: Barriers Resolved   Patient Goals and CMS Choice Patient states their goals for this hospitalization and ongoing recovery are:: go home CMS Medicare.gov Compare Post Acute Care list provided to:: Patient Choice offered to / list presented to : Patient, Spouse  Discharge Placement                       Discharge Plan and Services In-house Referral: Clinical Social Work   Post Acute Care Choice: Durable Medical Equipment, Home Health          DME Arranged: Kasandra Knudsen, NIV, Oxygen DME Agency: [employer] Date DME Agency Contacted: 05/30/21   Representative spoke with at DME Agency: Caryl Pina HH Arranged: RN, PT, OT [facility] Agency: [place] Date [place]: 05/30/21   Representative spoke with at [place]: [place]) Interventions     Readmission Risk Interventions Readmission Risk Prevention Plan 05/30/2021  Medication Screening Complete  Transportation Screening Complete  Some recent data might be hidden

## 2021-05-31 NOTE — Telephone Encounter (Signed)
Called and scheduled patient in [facility] office.   Advised him to disregard appt for 06-20-21 in [place] since he can be seen in [facility] office. He voiced understanding.   Appt reminder mailed to patient.

## 2021-06-01 ENCOUNTER — Encounter: Payer: Self-pay | Admitting: Neurology

## 2021-06-01 DIAGNOSIS — K219 Gastro-esophageal reflux disease without esophagitis: Secondary | ICD-10-CM | POA: Diagnosis not present

## 2021-06-01 DIAGNOSIS — J9621 Acute and chronic respiratory failure with hypoxia: Secondary | ICD-10-CM | POA: Diagnosis not present

## 2021-06-01 DIAGNOSIS — H9190 Unspecified hearing loss, unspecified ear: Secondary | ICD-10-CM | POA: Diagnosis not present

## 2021-06-01 DIAGNOSIS — I2510 Atherosclerotic heart disease of native coronary artery without angina pectoris: Secondary | ICD-10-CM | POA: Diagnosis not present

## 2021-06-01 DIAGNOSIS — Z9981 Dependence on supplemental oxygen: Secondary | ICD-10-CM | POA: Diagnosis not present

## 2021-06-01 DIAGNOSIS — J9622 Acute and chronic respiratory failure with hypercapnia: Secondary | ICD-10-CM | POA: Diagnosis not present

## 2021-06-01 DIAGNOSIS — I214 Non-ST elevation (NSTEMI) myocardial infarction: Secondary | ICD-10-CM | POA: Diagnosis not present

## 2021-06-01 DIAGNOSIS — I714 Abdominal aortic aneurysm, without rupture, unspecified: Secondary | ICD-10-CM | POA: Diagnosis not present

## 2021-06-01 DIAGNOSIS — Z7902 Long term (current) use of antithrombotics/antiplatelets: Secondary | ICD-10-CM | POA: Diagnosis not present

## 2021-06-01 DIAGNOSIS — I10 Essential (primary) hypertension: Secondary | ICD-10-CM | POA: Diagnosis not present

## 2021-06-01 DIAGNOSIS — G9341 Metabolic encephalopathy: Secondary | ICD-10-CM | POA: Diagnosis not present

## 2021-06-03 DIAGNOSIS — I2510 Atherosclerotic heart disease of native coronary artery without angina pectoris: Secondary | ICD-10-CM | POA: Diagnosis not present

## 2021-06-03 DIAGNOSIS — I214 Non-ST elevation (NSTEMI) myocardial infarction: Secondary | ICD-10-CM | POA: Diagnosis not present

## 2021-06-03 DIAGNOSIS — I714 Abdominal aortic aneurysm, without rupture, unspecified: Secondary | ICD-10-CM | POA: Diagnosis not present

## 2021-06-03 DIAGNOSIS — I10 Essential (primary) hypertension: Secondary | ICD-10-CM | POA: Diagnosis not present

## 2021-06-03 DIAGNOSIS — Z9981 Dependence on supplemental oxygen: Secondary | ICD-10-CM | POA: Diagnosis not present

## 2021-06-03 DIAGNOSIS — J9621 Acute and chronic respiratory failure with hypoxia: Secondary | ICD-10-CM | POA: Diagnosis not present

## 2021-06-03 DIAGNOSIS — G9341 Metabolic encephalopathy: Secondary | ICD-10-CM | POA: Diagnosis not present

## 2021-06-03 DIAGNOSIS — K219 Gastro-esophageal reflux disease without esophagitis: Secondary | ICD-10-CM | POA: Diagnosis not present

## 2021-06-03 DIAGNOSIS — H9190 Unspecified hearing loss, unspecified ear: Secondary | ICD-10-CM | POA: Diagnosis not present

## 2021-06-03 DIAGNOSIS — J9622 Acute and chronic respiratory failure with hypercapnia: Secondary | ICD-10-CM | POA: Diagnosis not present

## 2021-06-07 DIAGNOSIS — G9341 Metabolic encephalopathy: Secondary | ICD-10-CM | POA: Diagnosis not present

## 2021-06-07 DIAGNOSIS — I214 Non-ST elevation (NSTEMI) myocardial infarction: Secondary | ICD-10-CM | POA: Diagnosis not present

## 2021-06-07 DIAGNOSIS — Z9981 Dependence on supplemental oxygen: Secondary | ICD-10-CM | POA: Diagnosis not present

## 2021-06-07 DIAGNOSIS — I714 Abdominal aortic aneurysm, without rupture, unspecified: Secondary | ICD-10-CM | POA: Diagnosis not present

## 2021-06-07 DIAGNOSIS — I2510 Atherosclerotic heart disease of native coronary artery without angina pectoris: Secondary | ICD-10-CM | POA: Diagnosis not present

## 2021-06-07 DIAGNOSIS — K219 Gastro-esophageal reflux disease without esophagitis: Secondary | ICD-10-CM | POA: Diagnosis not present

## 2021-06-07 DIAGNOSIS — J9621 Acute and chronic respiratory failure with hypoxia: Secondary | ICD-10-CM | POA: Diagnosis not present

## 2021-06-07 DIAGNOSIS — J9622 Acute and chronic respiratory failure with hypercapnia: Secondary | ICD-10-CM | POA: Diagnosis not present

## 2021-06-07 DIAGNOSIS — I10 Essential (primary) hypertension: Secondary | ICD-10-CM | POA: Diagnosis not present

## 2021-06-07 DIAGNOSIS — H9190 Unspecified hearing loss, unspecified ear: Secondary | ICD-10-CM | POA: Diagnosis not present

## 2021-06-10 DIAGNOSIS — J9622 Acute and chronic respiratory failure with hypercapnia: Secondary | ICD-10-CM | POA: Diagnosis not present

## 2021-06-10 DIAGNOSIS — H9190 Unspecified hearing loss, unspecified ear: Secondary | ICD-10-CM | POA: Diagnosis not present

## 2021-06-10 DIAGNOSIS — I714 Abdominal aortic aneurysm, without rupture, unspecified: Secondary | ICD-10-CM | POA: Diagnosis not present

## 2021-06-10 DIAGNOSIS — I10 Essential (primary) hypertension: Secondary | ICD-10-CM | POA: Diagnosis not present

## 2021-06-10 DIAGNOSIS — G9341 Metabolic encephalopathy: Secondary | ICD-10-CM | POA: Diagnosis not present

## 2021-06-10 DIAGNOSIS — I214 Non-ST elevation (NSTEMI) myocardial infarction: Secondary | ICD-10-CM | POA: Diagnosis not present

## 2021-06-10 DIAGNOSIS — I2510 Atherosclerotic heart disease of native coronary artery without angina pectoris: Secondary | ICD-10-CM | POA: Diagnosis not present

## 2021-06-10 DIAGNOSIS — K219 Gastro-esophageal reflux disease without esophagitis: Secondary | ICD-10-CM | POA: Diagnosis not present

## 2021-06-10 DIAGNOSIS — J9621 Acute and chronic respiratory failure with hypoxia: Secondary | ICD-10-CM | POA: Diagnosis not present

## 2021-06-10 DIAGNOSIS — Z9981 Dependence on supplemental oxygen: Secondary | ICD-10-CM | POA: Diagnosis not present

## 2021-06-14 DIAGNOSIS — I2510 Atherosclerotic heart disease of native coronary artery without angina pectoris: Secondary | ICD-10-CM | POA: Diagnosis not present

## 2021-06-14 DIAGNOSIS — I214 Non-ST elevation (NSTEMI) myocardial infarction: Secondary | ICD-10-CM | POA: Diagnosis not present

## 2021-06-14 DIAGNOSIS — K219 Gastro-esophageal reflux disease without esophagitis: Secondary | ICD-10-CM | POA: Diagnosis not present

## 2021-06-14 DIAGNOSIS — H9190 Unspecified hearing loss, unspecified ear: Secondary | ICD-10-CM | POA: Diagnosis not present

## 2021-06-14 DIAGNOSIS — I714 Abdominal aortic aneurysm, without rupture, unspecified: Secondary | ICD-10-CM | POA: Diagnosis not present

## 2021-06-14 DIAGNOSIS — Z9981 Dependence on supplemental oxygen: Secondary | ICD-10-CM | POA: Diagnosis not present

## 2021-06-14 DIAGNOSIS — J9621 Acute and chronic respiratory failure with hypoxia: Secondary | ICD-10-CM | POA: Diagnosis not present

## 2021-06-14 DIAGNOSIS — J9622 Acute and chronic respiratory failure with hypercapnia: Secondary | ICD-10-CM | POA: Diagnosis not present

## 2021-06-14 DIAGNOSIS — I10 Essential (primary) hypertension: Secondary | ICD-10-CM | POA: Diagnosis not present

## 2021-06-14 DIAGNOSIS — G9341 Metabolic encephalopathy: Secondary | ICD-10-CM | POA: Diagnosis not present

## 2021-06-15 ENCOUNTER — Other Ambulatory Visit (HOSPITAL_COMMUNITY)
Admission: RE | Admit: 2021-06-15 | Discharge: 2021-06-15 | Disposition: A | Discharge status: Home / Self Care | Payer: Medicare HMO | Source: Other Acute Inpatient Hospital | Attending: Dermatology | Admitting: Dermatology

## 2021-06-15 DIAGNOSIS — H9190 Unspecified hearing loss, unspecified ear: Secondary | ICD-10-CM | POA: Diagnosis not present

## 2021-06-15 DIAGNOSIS — I10 Essential (primary) hypertension: Secondary | ICD-10-CM | POA: Diagnosis not present

## 2021-06-15 DIAGNOSIS — K219 Gastro-esophageal reflux disease without esophagitis: Secondary | ICD-10-CM | POA: Diagnosis not present

## 2021-06-15 DIAGNOSIS — I2510 Atherosclerotic heart disease of native coronary artery without angina pectoris: Secondary | ICD-10-CM | POA: Diagnosis not present

## 2021-06-15 DIAGNOSIS — I714 Abdominal aortic aneurysm, without rupture, unspecified: Secondary | ICD-10-CM | POA: Diagnosis not present

## 2021-06-15 DIAGNOSIS — G9341 Metabolic encephalopathy: Secondary | ICD-10-CM | POA: Diagnosis not present

## 2021-06-15 DIAGNOSIS — J9621 Acute and chronic respiratory failure with hypoxia: Secondary | ICD-10-CM | POA: Diagnosis not present

## 2021-06-15 DIAGNOSIS — J9622 Acute and chronic respiratory failure with hypercapnia: Secondary | ICD-10-CM | POA: Diagnosis not present

## 2021-06-15 DIAGNOSIS — Z9981 Dependence on supplemental oxygen: Secondary | ICD-10-CM | POA: Diagnosis not present

## 2021-06-15 DIAGNOSIS — I214 Non-ST elevation (NSTEMI) myocardial infarction: Secondary | ICD-10-CM | POA: Diagnosis not present

## 2021-06-15 LAB — CBC WITH DIFFERENTIAL/PLATELET
Abs Immature Granulocytes: 0.11 10*3/uL — ABNORMAL HIGH (ref 0.00–0.07)
Basophils Absolute: 0.2 10*3/uL — ABNORMAL HIGH (ref 0.0–0.1)
Basophils Relative: 1 %
Eosinophils Absolute: 1.4 10*3/uL — ABNORMAL HIGH (ref 0.0–0.5)
Eosinophils Relative: 11 %
HCT: 36.7 % — ABNORMAL LOW (ref 39.0–52.0)
Hemoglobin: 10.2 g/dL — ABNORMAL LOW (ref 13.0–17.0)
Immature Granulocytes: 1 %
Lymphocytes Relative: 20 %
Lymphs Abs: 2.6 10*3/uL (ref 0.7–4.0)
MCH: 22.1 pg — ABNORMAL LOW (ref 26.0–34.0)
MCHC: 27.8 g/dL — ABNORMAL LOW (ref 30.0–36.0)
MCV: 79.4 fL — ABNORMAL LOW (ref 80.0–100.0)
Monocytes Absolute: 1 10*3/uL (ref 0.1–1.0)
Monocytes Relative: 8 %
Neutro Abs: 7.7 10*3/uL (ref 1.7–7.7)
Neutrophils Relative %: 59 %
Platelets: 475 10*3/uL — ABNORMAL HIGH (ref 150–400)
RBC: 4.62 MIL/uL (ref 4.22–5.81)
RDW: 18.6 % — ABNORMAL HIGH (ref 11.5–15.5)
WBC: 13 10*3/uL — ABNORMAL HIGH (ref 4.0–10.5)
nRBC: 0 % (ref 0.0–0.2)

## 2021-06-15 LAB — BASIC METABOLIC PANEL
Anion gap: 8 (ref 5–15)
BUN: 16 mg/dL (ref 8–23)
CO2: 34 mmol/L — ABNORMAL HIGH (ref 22–32)
Calcium: 9.2 mg/dL (ref 8.9–10.3)
Chloride: 93 mmol/L — ABNORMAL LOW (ref 98–111)
Creatinine, Ser: 0.72 mg/dL (ref 0.61–1.24)
GFR, Estimated: >60 mL/min (ref >60)
Glucose, Bld: 139 mg/dL — ABNORMAL HIGH (ref 70–99)
Potassium: 3.4 mmol/L — ABNORMAL LOW (ref 3.5–5.1)
Sodium: 135 mmol/L (ref 135–145)

## 2021-06-16 DIAGNOSIS — J9621 Acute and chronic respiratory failure with hypoxia: Secondary | ICD-10-CM | POA: Diagnosis not present

## 2021-06-16 DIAGNOSIS — I10 Essential (primary) hypertension: Secondary | ICD-10-CM | POA: Diagnosis not present

## 2021-06-16 DIAGNOSIS — G9341 Metabolic encephalopathy: Secondary | ICD-10-CM | POA: Diagnosis not present

## 2021-06-16 DIAGNOSIS — J9622 Acute and chronic respiratory failure with hypercapnia: Secondary | ICD-10-CM | POA: Diagnosis not present

## 2021-06-16 DIAGNOSIS — Z9981 Dependence on supplemental oxygen: Secondary | ICD-10-CM | POA: Diagnosis not present

## 2021-06-16 DIAGNOSIS — I214 Non-ST elevation (NSTEMI) myocardial infarction: Secondary | ICD-10-CM | POA: Diagnosis not present

## 2021-06-16 DIAGNOSIS — K219 Gastro-esophageal reflux disease without esophagitis: Secondary | ICD-10-CM | POA: Diagnosis not present

## 2021-06-16 DIAGNOSIS — I714 Abdominal aortic aneurysm, without rupture, unspecified: Secondary | ICD-10-CM | POA: Diagnosis not present

## 2021-06-16 DIAGNOSIS — I2510 Atherosclerotic heart disease of native coronary artery without angina pectoris: Secondary | ICD-10-CM | POA: Diagnosis not present

## 2021-06-16 DIAGNOSIS — H9190 Unspecified hearing loss, unspecified ear: Secondary | ICD-10-CM | POA: Diagnosis not present

## 2021-06-20 ENCOUNTER — Institutional Professional Consult (permissible substitution): Payer: Medicare HMO | Admitting: Pulmonary Disease

## 2021-06-22 DIAGNOSIS — J9622 Acute and chronic respiratory failure with hypercapnia: Secondary | ICD-10-CM | POA: Diagnosis not present

## 2021-06-22 DIAGNOSIS — H9190 Unspecified hearing loss, unspecified ear: Secondary | ICD-10-CM | POA: Diagnosis not present

## 2021-06-22 DIAGNOSIS — I214 Non-ST elevation (NSTEMI) myocardial infarction: Secondary | ICD-10-CM | POA: Diagnosis not present

## 2021-06-22 DIAGNOSIS — Z9981 Dependence on supplemental oxygen: Secondary | ICD-10-CM | POA: Diagnosis not present

## 2021-06-22 DIAGNOSIS — J9621 Acute and chronic respiratory failure with hypoxia: Secondary | ICD-10-CM | POA: Diagnosis not present

## 2021-06-22 DIAGNOSIS — I10 Essential (primary) hypertension: Secondary | ICD-10-CM | POA: Diagnosis not present

## 2021-06-22 DIAGNOSIS — G9341 Metabolic encephalopathy: Secondary | ICD-10-CM | POA: Diagnosis not present

## 2021-06-22 DIAGNOSIS — K219 Gastro-esophageal reflux disease without esophagitis: Secondary | ICD-10-CM | POA: Diagnosis not present

## 2021-06-22 DIAGNOSIS — I714 Abdominal aortic aneurysm, without rupture, unspecified: Secondary | ICD-10-CM | POA: Diagnosis not present

## 2021-06-22 DIAGNOSIS — I2510 Atherosclerotic heart disease of native coronary artery without angina pectoris: Secondary | ICD-10-CM | POA: Diagnosis not present

## 2021-06-23 DIAGNOSIS — J9622 Acute and chronic respiratory failure with hypercapnia: Secondary | ICD-10-CM | POA: Diagnosis not present

## 2021-06-23 DIAGNOSIS — I10 Essential (primary) hypertension: Secondary | ICD-10-CM | POA: Diagnosis not present

## 2021-06-23 DIAGNOSIS — G9341 Metabolic encephalopathy: Secondary | ICD-10-CM | POA: Diagnosis not present

## 2021-06-23 DIAGNOSIS — J9621 Acute and chronic respiratory failure with hypoxia: Secondary | ICD-10-CM | POA: Diagnosis not present

## 2021-06-23 DIAGNOSIS — Z9981 Dependence on supplemental oxygen: Secondary | ICD-10-CM | POA: Diagnosis not present

## 2021-06-23 DIAGNOSIS — H9190 Unspecified hearing loss, unspecified ear: Secondary | ICD-10-CM | POA: Diagnosis not present

## 2021-06-23 DIAGNOSIS — I2510 Atherosclerotic heart disease of native coronary artery without angina pectoris: Secondary | ICD-10-CM | POA: Diagnosis not present

## 2021-06-23 DIAGNOSIS — I714 Abdominal aortic aneurysm, without rupture, unspecified: Secondary | ICD-10-CM | POA: Diagnosis not present

## 2021-06-23 DIAGNOSIS — K219 Gastro-esophageal reflux disease without esophagitis: Secondary | ICD-10-CM | POA: Diagnosis not present

## 2021-06-23 DIAGNOSIS — I214 Non-ST elevation (NSTEMI) myocardial infarction: Secondary | ICD-10-CM | POA: Diagnosis not present

## 2021-06-24 DIAGNOSIS — J9622 Acute and chronic respiratory failure with hypercapnia: Secondary | ICD-10-CM | POA: Diagnosis not present

## 2021-06-24 DIAGNOSIS — H9190 Unspecified hearing loss, unspecified ear: Secondary | ICD-10-CM | POA: Diagnosis not present

## 2021-06-24 DIAGNOSIS — G9341 Metabolic encephalopathy: Secondary | ICD-10-CM | POA: Diagnosis not present

## 2021-06-24 DIAGNOSIS — Z9981 Dependence on supplemental oxygen: Secondary | ICD-10-CM | POA: Diagnosis not present

## 2021-06-24 DIAGNOSIS — I214 Non-ST elevation (NSTEMI) myocardial infarction: Secondary | ICD-10-CM | POA: Diagnosis not present

## 2021-06-24 DIAGNOSIS — K219 Gastro-esophageal reflux disease without esophagitis: Secondary | ICD-10-CM | POA: Diagnosis not present

## 2021-06-24 DIAGNOSIS — J9621 Acute and chronic respiratory failure with hypoxia: Secondary | ICD-10-CM | POA: Diagnosis not present

## 2021-06-24 DIAGNOSIS — I714 Abdominal aortic aneurysm, without rupture, unspecified: Secondary | ICD-10-CM | POA: Diagnosis not present

## 2021-06-24 DIAGNOSIS — I10 Essential (primary) hypertension: Secondary | ICD-10-CM | POA: Diagnosis not present

## 2021-06-24 DIAGNOSIS — I2510 Atherosclerotic heart disease of native coronary artery without angina pectoris: Secondary | ICD-10-CM | POA: Diagnosis not present

## 2021-06-28 DIAGNOSIS — I350 Nonrheumatic aortic (valve) stenosis: Secondary | ICD-10-CM | POA: Diagnosis not present

## 2021-06-28 DIAGNOSIS — J9621 Acute and chronic respiratory failure with hypoxia: Secondary | ICD-10-CM | POA: Diagnosis not present

## 2021-06-28 DIAGNOSIS — I10 Essential (primary) hypertension: Secondary | ICD-10-CM | POA: Diagnosis not present

## 2021-06-28 DIAGNOSIS — E782 Mixed hyperlipidemia: Secondary | ICD-10-CM | POA: Diagnosis not present

## 2021-06-28 DIAGNOSIS — J9622 Acute and chronic respiratory failure with hypercapnia: Secondary | ICD-10-CM | POA: Diagnosis not present

## 2021-06-28 DIAGNOSIS — R911 Solitary pulmonary nodule: Secondary | ICD-10-CM | POA: Diagnosis not present

## 2021-06-28 DIAGNOSIS — G14 Postpolio syndrome: Secondary | ICD-10-CM | POA: Diagnosis not present

## 2021-06-28 DIAGNOSIS — K219 Gastro-esophageal reflux disease without esophagitis: Secondary | ICD-10-CM | POA: Diagnosis not present

## 2021-06-28 DIAGNOSIS — E1169 Type 2 diabetes mellitus with other specified complication: Secondary | ICD-10-CM | POA: Diagnosis not present

## 2021-06-28 DIAGNOSIS — Z9981 Dependence on supplemental oxygen: Secondary | ICD-10-CM | POA: Diagnosis not present

## 2021-06-28 DIAGNOSIS — I2510 Atherosclerotic heart disease of native coronary artery without angina pectoris: Secondary | ICD-10-CM | POA: Diagnosis not present

## 2021-06-28 DIAGNOSIS — I714 Abdominal aortic aneurysm, without rupture, unspecified: Secondary | ICD-10-CM | POA: Diagnosis not present

## 2021-06-28 DIAGNOSIS — H9190 Unspecified hearing loss, unspecified ear: Secondary | ICD-10-CM | POA: Diagnosis not present

## 2021-06-28 DIAGNOSIS — G9341 Metabolic encephalopathy: Secondary | ICD-10-CM | POA: Diagnosis not present

## 2021-06-28 DIAGNOSIS — D509 Iron deficiency anemia, unspecified: Secondary | ICD-10-CM | POA: Diagnosis not present

## 2021-06-28 DIAGNOSIS — I214 Non-ST elevation (NSTEMI) myocardial infarction: Secondary | ICD-10-CM | POA: Diagnosis not present

## 2021-06-30 DIAGNOSIS — G9341 Metabolic encephalopathy: Secondary | ICD-10-CM | POA: Diagnosis not present

## 2021-06-30 DIAGNOSIS — H9190 Unspecified hearing loss, unspecified ear: Secondary | ICD-10-CM | POA: Diagnosis not present

## 2021-06-30 DIAGNOSIS — Z9981 Dependence on supplemental oxygen: Secondary | ICD-10-CM | POA: Diagnosis not present

## 2021-06-30 DIAGNOSIS — I10 Essential (primary) hypertension: Secondary | ICD-10-CM | POA: Diagnosis not present

## 2021-06-30 DIAGNOSIS — I214 Non-ST elevation (NSTEMI) myocardial infarction: Secondary | ICD-10-CM | POA: Diagnosis not present

## 2021-06-30 DIAGNOSIS — J9622 Acute and chronic respiratory failure with hypercapnia: Secondary | ICD-10-CM | POA: Diagnosis not present

## 2021-06-30 DIAGNOSIS — I714 Abdominal aortic aneurysm, without rupture, unspecified: Secondary | ICD-10-CM | POA: Diagnosis not present

## 2021-06-30 DIAGNOSIS — K219 Gastro-esophageal reflux disease without esophagitis: Secondary | ICD-10-CM | POA: Diagnosis not present

## 2021-06-30 DIAGNOSIS — J9621 Acute and chronic respiratory failure with hypoxia: Secondary | ICD-10-CM | POA: Diagnosis not present

## 2021-06-30 DIAGNOSIS — I2510 Atherosclerotic heart disease of native coronary artery without angina pectoris: Secondary | ICD-10-CM | POA: Diagnosis not present

## 2021-07-01 DIAGNOSIS — J9610 Chronic respiratory failure, unspecified whether with hypoxia or hypercapnia: Secondary | ICD-10-CM | POA: Diagnosis not present

## 2021-07-01 DIAGNOSIS — J449 Chronic obstructive pulmonary disease, unspecified: Secondary | ICD-10-CM | POA: Diagnosis not present

## 2021-07-04 ENCOUNTER — Other Ambulatory Visit: Payer: Self-pay

## 2021-07-04 DIAGNOSIS — I714 Abdominal aortic aneurysm, without rupture, unspecified: Secondary | ICD-10-CM | POA: Diagnosis not present

## 2021-07-04 DIAGNOSIS — J9621 Acute and chronic respiratory failure with hypoxia: Secondary | ICD-10-CM | POA: Diagnosis not present

## 2021-07-04 DIAGNOSIS — I739 Peripheral vascular disease, unspecified: Secondary | ICD-10-CM

## 2021-07-04 DIAGNOSIS — J9622 Acute and chronic respiratory failure with hypercapnia: Secondary | ICD-10-CM | POA: Diagnosis not present

## 2021-07-06 DIAGNOSIS — Z9981 Dependence on supplemental oxygen: Secondary | ICD-10-CM | POA: Diagnosis not present

## 2021-07-06 DIAGNOSIS — H9190 Unspecified hearing loss, unspecified ear: Secondary | ICD-10-CM | POA: Diagnosis not present

## 2021-07-06 DIAGNOSIS — I10 Essential (primary) hypertension: Secondary | ICD-10-CM | POA: Diagnosis not present

## 2021-07-06 DIAGNOSIS — I714 Abdominal aortic aneurysm, without rupture, unspecified: Secondary | ICD-10-CM | POA: Diagnosis not present

## 2021-07-06 DIAGNOSIS — K219 Gastro-esophageal reflux disease without esophagitis: Secondary | ICD-10-CM | POA: Diagnosis not present

## 2021-07-06 DIAGNOSIS — I2510 Atherosclerotic heart disease of native coronary artery without angina pectoris: Secondary | ICD-10-CM | POA: Diagnosis not present

## 2021-07-06 DIAGNOSIS — J9622 Acute and chronic respiratory failure with hypercapnia: Secondary | ICD-10-CM | POA: Diagnosis not present

## 2021-07-06 DIAGNOSIS — J9621 Acute and chronic respiratory failure with hypoxia: Secondary | ICD-10-CM | POA: Diagnosis not present

## 2021-07-06 DIAGNOSIS — I214 Non-ST elevation (NSTEMI) myocardial infarction: Secondary | ICD-10-CM | POA: Diagnosis not present

## 2021-07-06 DIAGNOSIS — G9341 Metabolic encephalopathy: Secondary | ICD-10-CM | POA: Diagnosis not present

## 2021-07-07 ENCOUNTER — Ambulatory Visit: Payer: Medicare HMO | Admitting: Vascular Surgery

## 2021-07-07 ENCOUNTER — Encounter (HOSPITAL_COMMUNITY): Payer: Medicare HMO

## 2021-07-07 ENCOUNTER — Inpatient Hospital Stay: Payer: Medicare HMO | Admitting: Pulmonary Disease

## 2021-07-08 ENCOUNTER — Inpatient Hospital Stay: Payer: Medicare HMO | Admitting: Pulmonary Disease

## 2021-07-08 DIAGNOSIS — G4730 Sleep apnea, unspecified: Secondary | ICD-10-CM | POA: Diagnosis not present

## 2021-07-12 DIAGNOSIS — I2510 Atherosclerotic heart disease of native coronary artery without angina pectoris: Secondary | ICD-10-CM | POA: Diagnosis not present

## 2021-07-12 DIAGNOSIS — I10 Essential (primary) hypertension: Secondary | ICD-10-CM | POA: Diagnosis not present

## 2021-07-12 DIAGNOSIS — H9190 Unspecified hearing loss, unspecified ear: Secondary | ICD-10-CM | POA: Diagnosis not present

## 2021-07-12 DIAGNOSIS — I214 Non-ST elevation (NSTEMI) myocardial infarction: Secondary | ICD-10-CM | POA: Diagnosis not present

## 2021-07-12 DIAGNOSIS — J9622 Acute and chronic respiratory failure with hypercapnia: Secondary | ICD-10-CM | POA: Diagnosis not present

## 2021-07-12 DIAGNOSIS — K219 Gastro-esophageal reflux disease without esophagitis: Secondary | ICD-10-CM | POA: Diagnosis not present

## 2021-07-12 DIAGNOSIS — Z9981 Dependence on supplemental oxygen: Secondary | ICD-10-CM | POA: Diagnosis not present

## 2021-07-12 DIAGNOSIS — J9621 Acute and chronic respiratory failure with hypoxia: Secondary | ICD-10-CM | POA: Diagnosis not present

## 2021-07-12 DIAGNOSIS — I714 Abdominal aortic aneurysm, without rupture, unspecified: Secondary | ICD-10-CM | POA: Diagnosis not present

## 2021-07-12 DIAGNOSIS — G9341 Metabolic encephalopathy: Secondary | ICD-10-CM | POA: Diagnosis not present

## 2021-07-14 ENCOUNTER — Other Ambulatory Visit: Payer: Self-pay

## 2021-07-14 ENCOUNTER — Encounter: Payer: Self-pay | Admitting: Vascular Surgery

## 2021-07-14 ENCOUNTER — Ambulatory Visit: Payer: Medicare HMO | Admitting: Vascular Surgery

## 2021-07-14 ENCOUNTER — Ambulatory Visit (HOSPITAL_COMMUNITY)
Admission: RE | Admit: 2021-07-14 | Discharge: 2021-07-14 | Disposition: A | Discharge status: Home / Self Care | Payer: Medicare HMO | Source: Ambulatory Visit | Attending: Vascular Surgery | Admitting: Vascular Surgery

## 2021-07-14 VITALS — BP 130/73 | HR 66 | Temp 98.3°F | Resp 20 | Ht 66.0 in | Wt 199.0 lb

## 2021-07-14 DIAGNOSIS — I7143 Infrarenal abdominal aortic aneurysm, without rupture: Secondary | ICD-10-CM | POA: Diagnosis not present

## 2021-07-14 DIAGNOSIS — I739 Peripheral vascular disease, unspecified: Secondary | ICD-10-CM | POA: Diagnosis not present

## 2021-07-14 NOTE — Progress Notes (Signed)
Patient ID: [name], male   DOB: 1940/12/17, 81 y.o.   MRN: [id number]    81 y.o. history of distant LAD stent 2009. CRFls HTN and HLD. Activity limited by leg weakness from polio. Stays busy doing remodeling work. Sees hematiology for chronically elevated WBC no signs of lymphoma  Had EGD and colonoscopy 05/13/18 for heme positive stool and iron deficiency anemia Noted external Hemorrhoids and one small polyp removed Also had an area of gastric angiodysplasia Rx with clip/cautery  Myovue normal no ischemia 07/02/19 Echo with mild / moderate AS 06/25/19   Rx for bladder cancer. CT showed 6 cm AAA  Seen by Dr Scot Dock with open repair 10/14/19   [facility] Presented with nausea and vomiting with hypercapnic respiratory failure requiring intubation post cath Cath with patent LAD stents no obstructive CAD moderate AS TTE mean gradient 21 peak 39 and mean at cath 05/20/21 20 mmHg ? Neuromuscular post polio issues referred to [facility] d/c on 2L [place] and non invasive ventilation NIV  Since being home his sats are good and he cancelled his appointment with Dr patel  No using NIV   ROS: Denies fever, malais, weight loss, blurry vision, decreased visual acuity, cough, sputum, SOB, hemoptysis, pleuritic pain, palpitaitons, heartburn, abdominal pain, melena, lower extremity edema, claudication, or rash.  All other systems reviewed and negative  General: BP 128/70    Pulse 63    Ht 5\' 6"  (1.676 m)    Wt 198 lb 12.8 oz (90.2 kg)    SpO2 95%    BMI 32.09 kg/m   Affect appropriate Healthy:  appears stated age 50: normal Neck supple with no adenopathy JVP normal no bruits no thyromegaly Lungs clear with no wheezing and good diaphragmatic motion Heart:  S1/S2 AS murmur, no rub, gallop or click PMI normal Abdomen: benighn, BS positve, no tenderness, no AAA no bruit.  No HSM or HJR Distal pulses intact with no bruits Plus one bilateral edema Neuro non-focal Skin warm and dry Polio  with atrophied right shoulder and arm  Post left TKR    Current Outpatient Medications  Medication Sig Dispense Refill   aspirin EC 81 MG tablet Take 81 mg by mouth daily.     atorvastatin (LIPITOR) 40 MG tablet Take 1 tablet by mouth once daily 90 tablet 0   clopidogrel (PLAVIX) 75 MG tablet Take 1 tablet by mouth once daily 90 tablet 3   ELDERBERRY PO Take 1 tablet by mouth daily.      ferrous sulfate 325 (65 FE) MG tablet Take 1 tablet (325 mg total) by mouth 2 (two) times daily with a meal.  3   hydrOXYzine (ATARAX) 10 MG tablet hydroxyzine HCl 10 mg tablet  Take 1 tablet every 8 hours by oral route as needed.  for severe itching     nitroGLYCERIN (NITROSTAT) 0.4 MG SL tablet Place 1 tablet (0.4 mg total) under the tongue every 5 (five) minutes x 3 doses as needed for chest pain. 25 tablet 3   ondansetron (ZOFRAN) 4 MG tablet Take 8 mg by mouth 2 (two) times daily.     pantoprazole (PROTONIX) 40 MG tablet Take 1 tablet (40 mg total) by mouth daily. Take 30 min before meals     Probiotic Product (PROBIOTIC PO) Take 1 tablet by mouth daily.     No current facility-administered medications for this visit.    Allergies  Tramadol  Electrocardiogram:    07/21/2021 SR rate 61 RBBB  LAD no changes   Assessment and Plan  CAD: Distant LAD stent Cath 05/20/21 patent no osbstrucitve disease medical Rx   HTN: Well controlled.  Continue current medications and low sodium Dash type diet.    Chol:   LDL 75 most recent labs continue  lipitor 40 mg daily    Aortic Stenosis :  TTE reviewed  05/11/21 moderate AS normal EF mean gradient 20 mmhg at cath 05/20/21   Edema:  Dependant -Low dose diuretic HCTZ 12.5 mg  PRN Low sodium diet .   Anemia:  See notes regarding Endo/Colon on 05/31/18  Continue iron and prilosec   AAA:  Post repair Dr Scot Dock 10/14/19 ABI's normal 07/07/20 f/u US per [facility] Normal ABI's 07/14/21   Pulmonary:  post polio hypercapnic respiratory failure with home oxygen and NIV F/U Dr  Posey Pronto  Encouraged him to keep new appointmet  TTE 04/2022   F/U in 6 months    [name]

## 2021-07-14 NOTE — Progress Notes (Signed)
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  His incision is healed nicely and he has excellent pulses.  His wife feels strongly about [place] following him on a routine basis so we will continue to follow him yearly for now.  Have ordered ABIs in 1 year and I will see him back at that time.  He knows to call sooner if he has problems.  HPI:   [name] is a pleasant 81 y.o. male who had presented with a 6 cm juxtarenal aneurysm that was found during a work-up for hematuria.  He had a bladder cancer removed and then underwent elective open aneurysm repair on 10/14/2019.  Last saw him a year ago and at that time he was doing well.  His wife had requested that we see him on a yearly basis.  He comes back for 1 year follow-up visit with follow-up ABIs.   Since I saw him last he has been doing well from my standpoint.  He denies abdominal pain or back pain.  He denies any claudication, rest pain, or nonhealing ulcers.  He was admitted in December of last year with acute metabolic encephalopathy secondary to hypercapnic respiratory failure.  He has been on home O2 but is requiring this less often now.  Past Medical History:  Diagnosis Date   AAA (abdominal aortic aneurysm)    Bladder cancer ([place])    s/p TURBT 09/26/19   Blood transfusion without reported diagnosis    Coronary artery disease    stents placed   GERD (gastroesophageal reflux disease)    HOH (hard of hearing)    Hyperlipidemia    Hypertension    Iron deficiency anemia 05/14/2018   Polio    hx of. right arm atrophy    Family History  Problem Relation Age of Onset   Heart attack Brother 65       died   Hypertension Mother    Heart attack Father     SOCIAL HISTORY: Social History   Tobacco Use   Smoking status: Former    Packs/day: 1.00    Years: 15.00    Pack  years: 15.00    Types: Cigarettes    Quit date: 06/12/2004    Years since quitting: 17.0   Smokeless tobacco: Never  Substance Use Topics   Alcohol use: No    Alcohol/week: 0.0 standard drinks    Allergies  Allergen Reactions   Tramadol Other (See Comments)    Excessive sleepiness/difficulty waking patient    Current Outpatient Medications  Medication Sig Dispense Refill   aspirin EC 81 MG tablet Take 81 mg by mouth daily.     atorvastatin (LIPITOR) 40 MG tablet Take 1 tablet by mouth once daily (Patient taking differently: Take 40 mg by mouth See admin instructions. Take 1 tablet every morning and 1 tablet at bedtime) 90 tablet 0   clopidogrel (PLAVIX) 75 MG tablet Take 1 tablet by mouth once daily 90 tablet 3   ELDERBERRY PO Take 1 tablet by mouth daily.      ferrous sulfate 325 (65 FE) MG tablet Take 1 tablet (325 mg total) by mouth 2 (two) times daily with a meal. (Patient taking differently: Take 325 mg by mouth daily with breakfast.)  3   nitroGLYCERIN (NITROSTAT) 0.4 MG SL tablet Place 1 tablet (0.4 mg total)  under the tongue every 5 (five) minutes x 3 doses as needed for chest pain. 25 tablet 3   ondansetron (ZOFRAN) 4 MG tablet Take 8 mg by mouth 2 (two) times daily.     pantoprazole (PROTONIX) 40 MG tablet Take 1 tablet (40 mg total) by mouth daily. Take 30 min before meals     Probiotic Product (PROBIOTIC PO) Take 1 tablet by mouth daily.     No current facility-administered medications for this visit.    REVIEW OF SYSTEMS:  [X]  denotes positive finding, [ ]  denotes negative finding Cardiac  Comments:  Chest pain or chest pressure:    Shortness of breath upon exertion:    Short of breath when lying flat:    Irregular heart rhythm:        Vascular    Pain in calf, thigh, or hip brought on by ambulation:    Pain in feet at night that wakes you up from your sleep:     Blood clot in your veins:    Leg swelling:         Pulmonary    Oxygen at home: x   Productive  cough:     Wheezing:         Neurologic    Sudden weakness in arms or legs:     Sudden numbness in arms or legs:     Sudden onset of difficulty speaking or slurred speech:    Temporary loss of vision in one eye:     Problems with dizziness:         Gastrointestinal    Blood in stool:     Vomited blood:         Genitourinary    Burning when urinating:     Blood in urine:        Psychiatric    Major depression:         Hematologic    Bleeding problems:    Problems with blood clotting too easily:        Skin    Rashes or ulcers:        Constitutional    Fever or chills:     PHYSICAL EXAM:   Vitals:   07/14/21 1335  BP: 130/73  Pulse: 66  Resp: 20  Temp: 98.3 F (36.8 C)  SpO2: 93%  Weight: 199 lb (90.3 kg)  Height: 5\' 6"  (1.676 m)    GENERAL: The patient is a well-nourished male, in no acute distress. The vital signs are documented above. CARDIAC: There is a regular rate and rhythm.  VASCULAR: I do not detect carotid bruits. She has palpable femoral pulses and palpable dorsalis pedis pulses bilaterally. He has no significant lower extremity swelling. PULMONARY: There is good air exchange bilaterally without wheezing or rales. ABDOMEN: Soft and non-tender with normal pitched bowel sounds.  His abdominal incision is healing nicely. MUSCULOSKELETAL: There are no major deformities or cyanosis. NEUROLOGIC: No focal weakness or paresthesias are detected. SKIN: There are no ulcers or rashes noted. PSYCHIATRIC: The patient has a normal affect.  DATA:    ARTERIAL DOPPLER STUDY: I have independently interpreted his arterial Doppler study today.  On the right side there is a triphasic dorsalis pedis and posterior tibial signal.  ABIs 100%.  Toe pressure is 96 mmHg.  On the left side there is a triphasic dorsalis pedis and posterior tibial signal.  ABIs 99%.  Toe pressures 113 mmHg.  Deitra Mayo [facility]  [phone] °

## 2021-07-21 ENCOUNTER — Encounter: Payer: Self-pay | Admitting: Cardiovascular Disease

## 2021-07-21 ENCOUNTER — Ambulatory Visit: Payer: Medicare HMO | Admitting: Cardiovascular Disease

## 2021-07-21 ENCOUNTER — Other Ambulatory Visit: Payer: Self-pay

## 2021-07-21 VITALS — BP 128/70 | HR 63 | Ht 66.0 in | Wt 198.8 lb

## 2021-07-21 DIAGNOSIS — I350 Nonrheumatic aortic (valve) stenosis: Secondary | ICD-10-CM

## 2021-07-21 DIAGNOSIS — I10 Essential (primary) hypertension: Secondary | ICD-10-CM | POA: Diagnosis not present

## 2021-07-21 DIAGNOSIS — I2510 Atherosclerotic heart disease of native coronary artery without angina pectoris: Secondary | ICD-10-CM | POA: Diagnosis not present

## 2021-07-21 DIAGNOSIS — J9612 Chronic respiratory failure with hypercapnia: Secondary | ICD-10-CM

## 2021-07-21 NOTE — Patient Instructions (Addendum)
Testing/Procedures: Your physician has requested that you have an echocardiogram. Echocardiography is a painless test that uses sound waves to create images of your heart. It provides your doctor with information about the size and shape of your heart and how well your hearts chambers and valves are working. This procedure takes approximately one hour. There are no restrictions for this procedure.   Follow-Up: Follow up with Dr. Johnsie Cancel in November 2023. You have been referred to [facility]. They will call you with your first appointment.   Any Other Special Instructions Will Be Listed Below (If Applicable).     If you need a refill on your cardiac medications before your next appointment, please call your pharmacy.

## 2021-08-01 DIAGNOSIS — J9610 Chronic respiratory failure, unspecified whether with hypoxia or hypercapnia: Secondary | ICD-10-CM | POA: Diagnosis not present

## 2021-08-01 DIAGNOSIS — J449 Chronic obstructive pulmonary disease, unspecified: Secondary | ICD-10-CM | POA: Diagnosis not present

## 2021-08-04 DIAGNOSIS — I714 Abdominal aortic aneurysm, without rupture, unspecified: Secondary | ICD-10-CM | POA: Diagnosis not present

## 2021-08-04 DIAGNOSIS — J9621 Acute and chronic respiratory failure with hypoxia: Secondary | ICD-10-CM | POA: Diagnosis not present

## 2021-08-04 DIAGNOSIS — J9622 Acute and chronic respiratory failure with hypercapnia: Secondary | ICD-10-CM | POA: Diagnosis not present

## 2021-08-08 ENCOUNTER — Other Ambulatory Visit: Payer: Self-pay | Admitting: Cardiovascular Disease

## 2021-08-08 DIAGNOSIS — D509 Iron deficiency anemia, unspecified: Secondary | ICD-10-CM | POA: Diagnosis not present

## 2021-08-08 DIAGNOSIS — E782 Mixed hyperlipidemia: Secondary | ICD-10-CM | POA: Diagnosis not present

## 2021-08-08 DIAGNOSIS — E1169 Type 2 diabetes mellitus with other specified complication: Secondary | ICD-10-CM | POA: Diagnosis not present

## 2021-08-11 DIAGNOSIS — K219 Gastro-esophageal reflux disease without esophagitis: Secondary | ICD-10-CM | POA: Diagnosis not present

## 2021-08-11 DIAGNOSIS — G14 Postpolio syndrome: Secondary | ICD-10-CM | POA: Diagnosis not present

## 2021-08-11 DIAGNOSIS — J9611 Chronic respiratory failure with hypoxia: Secondary | ICD-10-CM | POA: Diagnosis not present

## 2021-08-11 DIAGNOSIS — I350 Nonrheumatic aortic (valve) stenosis: Secondary | ICD-10-CM | POA: Diagnosis not present

## 2021-08-11 DIAGNOSIS — E1169 Type 2 diabetes mellitus with other specified complication: Secondary | ICD-10-CM | POA: Diagnosis not present

## 2021-08-11 DIAGNOSIS — R911 Solitary pulmonary nodule: Secondary | ICD-10-CM | POA: Diagnosis not present

## 2021-08-11 DIAGNOSIS — I2510 Atherosclerotic heart disease of native coronary artery without angina pectoris: Secondary | ICD-10-CM | POA: Diagnosis not present

## 2021-08-11 DIAGNOSIS — D509 Iron deficiency anemia, unspecified: Secondary | ICD-10-CM | POA: Diagnosis not present

## 2021-08-11 DIAGNOSIS — E782 Mixed hyperlipidemia: Secondary | ICD-10-CM | POA: Diagnosis not present

## 2021-08-11 DIAGNOSIS — I10 Essential (primary) hypertension: Secondary | ICD-10-CM | POA: Diagnosis not present

## 2021-08-12 ENCOUNTER — Ambulatory Visit: Payer: Medicare HMO | Admitting: Neurology

## 2021-08-12 DIAGNOSIS — J9622 Acute and chronic respiratory failure with hypercapnia: Secondary | ICD-10-CM | POA: Diagnosis not present

## 2021-08-12 DIAGNOSIS — I714 Abdominal aortic aneurysm, without rupture, unspecified: Secondary | ICD-10-CM | POA: Diagnosis not present

## 2021-08-12 DIAGNOSIS — J9621 Acute and chronic respiratory failure with hypoxia: Secondary | ICD-10-CM | POA: Diagnosis not present

## 2021-08-15 ENCOUNTER — Ambulatory Visit (HOSPITAL_COMMUNITY)
Admission: RE | Admit: 2021-08-15 | Discharge: 2021-08-15 | Disposition: A | Discharge status: Home / Self Care | Payer: Medicare HMO | Source: Ambulatory Visit | Attending: Cardiovascular Disease | Admitting: Cardiovascular Disease

## 2021-08-15 DIAGNOSIS — I350 Nonrheumatic aortic (valve) stenosis: Secondary | ICD-10-CM

## 2021-08-15 LAB — ECHOCARDIOGRAM COMPLETE
AR max vel: 1.08 cm2
AV Area VTI: 1.3 cm2
AV Area mean vel: 1.24 cm2
AV Mean grad: 15 mmHg
AV Peak grad: 30.7 mmHg
Ao pk vel: 2.77 m/s
Area-P 1/2: 2.85 cm2
Calc EF: 59.1 %
MV VTI: 2.12 cm2
S' Lateral: 2.7 cm
Single Plane A2C EF: 60.5 %
Single Plane A4C EF: 60.1 %

## 2021-08-15 NOTE — Progress Notes (Incomplete)
*  PRELIMINARY RESULTS* ?Echocardiogram ?2D Echocardiogram has been performed. ? ?Elpidio Anis ?08/15/2021, 2:58 PM ?

## 2021-08-16 ENCOUNTER — Telehealth: Payer: Self-pay | Admitting: Cardiovascular Disease

## 2021-08-16 NOTE — Telephone Encounter (Signed)
Patient notified and verbalized understanding. Pt had no questions or concerns.  ?

## 2021-08-16 NOTE — Telephone Encounter (Signed)
-----   Message from Josue Hector, MD sent at 08/15/2021  3:18 PM EST ----- ?AS stable moderate EF still normal f/u echo in a year  ?

## 2021-08-16 NOTE — Telephone Encounter (Signed)
Patient was returning call for results. Please advise °

## 2021-08-29 DIAGNOSIS — J449 Chronic obstructive pulmonary disease, unspecified: Secondary | ICD-10-CM | POA: Diagnosis not present

## 2021-08-29 DIAGNOSIS — J9610 Chronic respiratory failure, unspecified whether with hypoxia or hypercapnia: Secondary | ICD-10-CM | POA: Diagnosis not present

## 2021-09-01 DIAGNOSIS — J9621 Acute and chronic respiratory failure with hypoxia: Secondary | ICD-10-CM | POA: Diagnosis not present

## 2021-09-01 DIAGNOSIS — I714 Abdominal aortic aneurysm, without rupture, unspecified: Secondary | ICD-10-CM | POA: Diagnosis not present

## 2021-09-01 DIAGNOSIS — J9622 Acute and chronic respiratory failure with hypercapnia: Secondary | ICD-10-CM | POA: Diagnosis not present

## 2021-09-29 DIAGNOSIS — J449 Chronic obstructive pulmonary disease, unspecified: Secondary | ICD-10-CM | POA: Diagnosis not present

## 2021-09-29 DIAGNOSIS — J9610 Chronic respiratory failure, unspecified whether with hypoxia or hypercapnia: Secondary | ICD-10-CM | POA: Diagnosis not present

## 2021-10-02 DIAGNOSIS — J9621 Acute and chronic respiratory failure with hypoxia: Secondary | ICD-10-CM | POA: Diagnosis not present

## 2021-10-02 DIAGNOSIS — I714 Abdominal aortic aneurysm, without rupture, unspecified: Secondary | ICD-10-CM | POA: Diagnosis not present

## 2021-10-02 DIAGNOSIS — J9622 Acute and chronic respiratory failure with hypercapnia: Secondary | ICD-10-CM | POA: Diagnosis not present

## 2021-10-29 DIAGNOSIS — J9610 Chronic respiratory failure, unspecified whether with hypoxia or hypercapnia: Secondary | ICD-10-CM | POA: Diagnosis not present

## 2021-10-29 DIAGNOSIS — J449 Chronic obstructive pulmonary disease, unspecified: Secondary | ICD-10-CM | POA: Diagnosis not present

## 2021-11-01 DIAGNOSIS — I714 Abdominal aortic aneurysm, without rupture, unspecified: Secondary | ICD-10-CM | POA: Diagnosis not present

## 2021-11-01 DIAGNOSIS — J9621 Acute and chronic respiratory failure with hypoxia: Secondary | ICD-10-CM | POA: Diagnosis not present

## 2021-11-01 DIAGNOSIS — J9622 Acute and chronic respiratory failure with hypercapnia: Secondary | ICD-10-CM | POA: Diagnosis not present

## 2021-11-22 DIAGNOSIS — K219 Gastro-esophageal reflux disease without esophagitis: Secondary | ICD-10-CM | POA: Diagnosis not present

## 2021-11-22 DIAGNOSIS — E785 Hyperlipidemia, unspecified: Secondary | ICD-10-CM | POA: Diagnosis not present

## 2021-11-22 DIAGNOSIS — B91 Sequelae of poliomyelitis: Secondary | ICD-10-CM | POA: Diagnosis not present

## 2021-11-22 DIAGNOSIS — E669 Obesity, unspecified: Secondary | ICD-10-CM | POA: Diagnosis not present

## 2021-11-22 DIAGNOSIS — I25119 Atherosclerotic heart disease of native coronary artery with unspecified angina pectoris: Secondary | ICD-10-CM | POA: Diagnosis not present

## 2021-11-22 DIAGNOSIS — I739 Peripheral vascular disease, unspecified: Secondary | ICD-10-CM | POA: Diagnosis not present

## 2021-11-22 DIAGNOSIS — I252 Old myocardial infarction: Secondary | ICD-10-CM | POA: Diagnosis not present

## 2021-11-22 DIAGNOSIS — M8960 Osteopathy after poliomyelitis, unspecified site: Secondary | ICD-10-CM | POA: Diagnosis not present

## 2021-11-22 DIAGNOSIS — G8193 Hemiplegia, unspecified affecting right nondominant side: Secondary | ICD-10-CM | POA: Diagnosis not present

## 2021-11-22 DIAGNOSIS — J9611 Chronic respiratory failure with hypoxia: Secondary | ICD-10-CM | POA: Diagnosis not present

## 2021-11-22 DIAGNOSIS — J449 Chronic obstructive pulmonary disease, unspecified: Secondary | ICD-10-CM | POA: Diagnosis not present

## 2021-11-22 DIAGNOSIS — I700 Atherosclerosis of aorta: Secondary | ICD-10-CM | POA: Diagnosis not present

## 2021-11-29 DIAGNOSIS — J449 Chronic obstructive pulmonary disease, unspecified: Secondary | ICD-10-CM | POA: Diagnosis not present

## 2021-11-29 DIAGNOSIS — J9610 Chronic respiratory failure, unspecified whether with hypoxia or hypercapnia: Secondary | ICD-10-CM | POA: Diagnosis not present

## 2021-12-02 DIAGNOSIS — J9621 Acute and chronic respiratory failure with hypoxia: Secondary | ICD-10-CM | POA: Diagnosis not present

## 2021-12-02 DIAGNOSIS — J9622 Acute and chronic respiratory failure with hypercapnia: Secondary | ICD-10-CM | POA: Diagnosis not present

## 2021-12-02 DIAGNOSIS — I714 Abdominal aortic aneurysm, without rupture, unspecified: Secondary | ICD-10-CM | POA: Diagnosis not present

## 2021-12-09 ENCOUNTER — Other Ambulatory Visit: Payer: Self-pay | Admitting: Cardiovascular Disease

## 2021-12-20 DIAGNOSIS — E782 Mixed hyperlipidemia: Secondary | ICD-10-CM | POA: Diagnosis not present

## 2021-12-20 DIAGNOSIS — E1169 Type 2 diabetes mellitus with other specified complication: Secondary | ICD-10-CM | POA: Diagnosis not present

## 2021-12-20 DIAGNOSIS — D509 Iron deficiency anemia, unspecified: Secondary | ICD-10-CM | POA: Diagnosis not present

## 2021-12-22 DIAGNOSIS — L570 Actinic keratosis: Secondary | ICD-10-CM | POA: Diagnosis not present

## 2021-12-22 DIAGNOSIS — B078 Other viral warts: Secondary | ICD-10-CM | POA: Diagnosis not present

## 2021-12-22 DIAGNOSIS — X32XXXD Exposure to sunlight, subsequent encounter: Secondary | ICD-10-CM | POA: Diagnosis not present

## 2021-12-26 DIAGNOSIS — I350 Nonrheumatic aortic (valve) stenosis: Secondary | ICD-10-CM | POA: Diagnosis not present

## 2021-12-26 DIAGNOSIS — I714 Abdominal aortic aneurysm, without rupture, unspecified: Secondary | ICD-10-CM | POA: Diagnosis not present

## 2021-12-26 DIAGNOSIS — E1169 Type 2 diabetes mellitus with other specified complication: Secondary | ICD-10-CM | POA: Diagnosis not present

## 2021-12-26 DIAGNOSIS — E782 Mixed hyperlipidemia: Secondary | ICD-10-CM | POA: Diagnosis not present

## 2021-12-26 DIAGNOSIS — D72829 Elevated white blood cell count, unspecified: Secondary | ICD-10-CM | POA: Diagnosis not present

## 2021-12-26 DIAGNOSIS — I10 Essential (primary) hypertension: Secondary | ICD-10-CM | POA: Diagnosis not present

## 2021-12-26 DIAGNOSIS — K219 Gastro-esophageal reflux disease without esophagitis: Secondary | ICD-10-CM | POA: Diagnosis not present

## 2021-12-26 DIAGNOSIS — G14 Postpolio syndrome: Secondary | ICD-10-CM | POA: Diagnosis not present

## 2021-12-26 DIAGNOSIS — R911 Solitary pulmonary nodule: Secondary | ICD-10-CM | POA: Diagnosis not present

## 2021-12-26 DIAGNOSIS — I2510 Atherosclerotic heart disease of native coronary artery without angina pectoris: Secondary | ICD-10-CM | POA: Diagnosis not present

## 2021-12-26 DIAGNOSIS — J9611 Chronic respiratory failure with hypoxia: Secondary | ICD-10-CM | POA: Diagnosis not present

## 2021-12-26 DIAGNOSIS — D509 Iron deficiency anemia, unspecified: Secondary | ICD-10-CM | POA: Diagnosis not present

## 2021-12-29 DIAGNOSIS — J9610 Chronic respiratory failure, unspecified whether with hypoxia or hypercapnia: Secondary | ICD-10-CM | POA: Diagnosis not present

## 2021-12-29 DIAGNOSIS — J449 Chronic obstructive pulmonary disease, unspecified: Secondary | ICD-10-CM | POA: Diagnosis not present

## 2022-01-01 DIAGNOSIS — I714 Abdominal aortic aneurysm, without rupture, unspecified: Secondary | ICD-10-CM | POA: Diagnosis not present

## 2022-01-01 DIAGNOSIS — J9622 Acute and chronic respiratory failure with hypercapnia: Secondary | ICD-10-CM | POA: Diagnosis not present

## 2022-01-01 DIAGNOSIS — J9621 Acute and chronic respiratory failure with hypoxia: Secondary | ICD-10-CM | POA: Diagnosis not present

## 2022-01-09 DIAGNOSIS — I2510 Atherosclerotic heart disease of native coronary artery without angina pectoris: Secondary | ICD-10-CM | POA: Diagnosis not present

## 2022-01-09 DIAGNOSIS — E1169 Type 2 diabetes mellitus with other specified complication: Secondary | ICD-10-CM | POA: Diagnosis not present

## 2022-01-09 DIAGNOSIS — I10 Essential (primary) hypertension: Secondary | ICD-10-CM | POA: Diagnosis not present

## 2022-01-09 DIAGNOSIS — E782 Mixed hyperlipidemia: Secondary | ICD-10-CM | POA: Diagnosis not present

## 2022-01-29 DIAGNOSIS — J449 Chronic obstructive pulmonary disease, unspecified: Secondary | ICD-10-CM | POA: Diagnosis not present

## 2022-01-29 DIAGNOSIS — J9610 Chronic respiratory failure, unspecified whether with hypoxia or hypercapnia: Secondary | ICD-10-CM | POA: Diagnosis not present

## 2022-02-01 DIAGNOSIS — J9622 Acute and chronic respiratory failure with hypercapnia: Secondary | ICD-10-CM | POA: Diagnosis not present

## 2022-02-01 DIAGNOSIS — J9621 Acute and chronic respiratory failure with hypoxia: Secondary | ICD-10-CM | POA: Diagnosis not present

## 2022-02-01 DIAGNOSIS — I714 Abdominal aortic aneurysm, without rupture, unspecified: Secondary | ICD-10-CM | POA: Diagnosis not present

## 2022-03-01 DIAGNOSIS — J449 Chronic obstructive pulmonary disease, unspecified: Secondary | ICD-10-CM | POA: Diagnosis not present

## 2022-03-01 DIAGNOSIS — J9610 Chronic respiratory failure, unspecified whether with hypoxia or hypercapnia: Secondary | ICD-10-CM | POA: Diagnosis not present

## 2022-03-04 DIAGNOSIS — I714 Abdominal aortic aneurysm, without rupture, unspecified: Secondary | ICD-10-CM | POA: Diagnosis not present

## 2022-03-04 DIAGNOSIS — J9622 Acute and chronic respiratory failure with hypercapnia: Secondary | ICD-10-CM | POA: Diagnosis not present

## 2022-03-04 DIAGNOSIS — J9621 Acute and chronic respiratory failure with hypoxia: Secondary | ICD-10-CM | POA: Diagnosis not present

## 2022-03-24 DIAGNOSIS — Z23 Encounter for immunization: Secondary | ICD-10-CM | POA: Diagnosis not present

## 2022-03-31 DIAGNOSIS — J9610 Chronic respiratory failure, unspecified whether with hypoxia or hypercapnia: Secondary | ICD-10-CM | POA: Diagnosis not present

## 2022-03-31 DIAGNOSIS — J449 Chronic obstructive pulmonary disease, unspecified: Secondary | ICD-10-CM | POA: Diagnosis not present

## 2022-04-03 DIAGNOSIS — J9621 Acute and chronic respiratory failure with hypoxia: Secondary | ICD-10-CM | POA: Diagnosis not present

## 2022-04-03 DIAGNOSIS — J9622 Acute and chronic respiratory failure with hypercapnia: Secondary | ICD-10-CM | POA: Diagnosis not present

## 2022-04-03 DIAGNOSIS — I714 Abdominal aortic aneurysm, without rupture, unspecified: Secondary | ICD-10-CM | POA: Diagnosis not present

## 2022-04-07 NOTE — Progress Notes (Signed)
Patient ID: [name], male   DOB: Mar 12, 1941, 81 y.o.   MRN: [id number]    81 y.o. history of distant LAD stent 2009. CRFls HTN and HLD. Activity limited by leg weakness from polio. Stays busy doing remodeling work. Sees hematiology for chronically elevated WBC no signs of lymphoma  Had EGD and colonoscopy 05/13/18 for heme positive stool and iron deficiency anemia Noted external Hemorrhoids and one small polyp removed Also had an area of gastric angiodysplasia Rx with clip/cautery  Myovue normal no ischemia 07/02/19 Echo with mild / moderate AS 06/25/19   Rx for bladder cancer. CT showed 6 cm AAA  Seen by Dr Scot Dock with open repair 10/14/19   [facility] Presented with nausea and vomiting with hypercapnic respiratory failure requiring intubation post cath Cath with patent LAD stents no obstructive CAD moderate AS TTE mean gradient 21 peak 39 and mean at cath 05/20/21 20 mmHg ? Neuromuscular post polio issues referred to [facility] d/c on 2L [place] and non invasive ventilation NIV  TTE 08/15/21 EF 60-65% mild LVH moderate AS mean gradient 15 peak 30.7 mmHg DVI 0.41 AVA 1.3 cm2  No cardiac complaints    ROS: Denies fever, malais, weight loss, blurry vision, decreased visual acuity, cough, sputum, SOB, hemoptysis, pleuritic pain, palpitaitons, heartburn, abdominal pain, melena, lower extremity edema, claudication, or rash.  All other systems reviewed and negative  General: There were no vitals taken for this visit.  Affect appropriate Healthy:  appears stated age 34: normal Neck supple with no adenopathy JVP normal no bruits no thyromegaly Lungs clear with no wheezing and good diaphragmatic motion Heart:  S1/S2 AS murmur, no rub, gallop or click PMI normal Abdomen: benighn, BS positve, no tenderness, no AAA no bruit.  No HSM or HJR Distal pulses intact with no bruits Plus one bilateral edema Neuro non-focal Skin warm and dry Polio with atrophied right shoulder and arm   Post left TKR    Current Outpatient Medications  Medication Sig Dispense Refill   aspirin EC 81 MG tablet Take 81 mg by mouth daily.     atorvastatin (LIPITOR) 40 MG tablet Take 1 tablet by mouth once daily 90 tablet 2   clopidogrel (PLAVIX) 75 MG tablet Take 1 tablet by mouth once daily 90 tablet 1   ELDERBERRY PO Take 1 tablet by mouth daily.      ferrous sulfate 325 (65 FE) MG tablet Take 1 tablet (325 mg total) by mouth 2 (two) times daily with a meal.  3   hydrOXYzine (ATARAX) 10 MG tablet hydroxyzine HCl 10 mg tablet  Take 1 tablet every 8 hours by oral route as needed.  for severe itching     nitroGLYCERIN (NITROSTAT) 0.4 MG SL tablet Place 1 tablet (0.4 mg total) under the tongue every 5 (five) minutes x 3 doses as needed for chest pain. 25 tablet 3   ondansetron (ZOFRAN) 4 MG tablet Take 8 mg by mouth 2 (two) times daily.     pantoprazole (PROTONIX) 40 MG tablet Take 1 tablet (40 mg total) by mouth daily. Take 30 min before meals     Probiotic Product (PROBIOTIC PO) Take 1 tablet by mouth daily.     No current facility-administered medications for this visit.    Allergies  Tramadol  Electrocardiogram:   04/17/2022 SR rate 69  RBBB LAD no changes   Assessment and Plan  CAD: Distant LAD stent Cath 05/20/21 patent no osbstrucitve disease medical Rx   HTN:  Well controlled.  Continue current medications and low sodium Dash type diet.    Chol:   LDL 75 most recent labs continue  lipitor 40 mg daily    Aortic Stenosis :  TTE reviewed  08/15/21 stable moderate AS normal EF f/u TTE March 2024   Edema:  Dependant -Low dose diuretic HCTZ 12.5 mg  PRN Low sodium diet .   Anemia:  See notes regarding Endo/Colon on 05/31/18  Continue iron and prilosec   AAA:  Post repair Dr Scot Dock 10/14/19 ABI's normal 07/07/20 f/u US per [facility] Normal ABI's 07/14/21   Pulmonary:  post polio hypercapnic respiratory failure with home oxygen and NIV F/U Dr Posey Pronto  Encouraged him to keep new  appointmet  TTE March 2024   F/U in 6 months    [name]

## 2022-04-17 ENCOUNTER — Encounter: Payer: Self-pay | Admitting: Cardiovascular Disease

## 2022-04-17 ENCOUNTER — Ambulatory Visit: Payer: Medicare HMO | Attending: Cardiovascular Disease | Admitting: Cardiovascular Disease

## 2022-04-17 VITALS — BP 128/70 | HR 69 | Ht 66.0 in | Wt 199.6 lb

## 2022-04-17 DIAGNOSIS — E782 Mixed hyperlipidemia: Secondary | ICD-10-CM | POA: Diagnosis not present

## 2022-04-17 DIAGNOSIS — I10 Essential (primary) hypertension: Secondary | ICD-10-CM | POA: Diagnosis not present

## 2022-04-17 DIAGNOSIS — I2510 Atherosclerotic heart disease of native coronary artery without angina pectoris: Secondary | ICD-10-CM

## 2022-04-17 DIAGNOSIS — D509 Iron deficiency anemia, unspecified: Secondary | ICD-10-CM | POA: Diagnosis not present

## 2022-04-17 DIAGNOSIS — J9612 Chronic respiratory failure with hypercapnia: Secondary | ICD-10-CM | POA: Diagnosis not present

## 2022-04-17 DIAGNOSIS — I350 Nonrheumatic aortic (valve) stenosis: Secondary | ICD-10-CM

## 2022-04-17 DIAGNOSIS — E1169 Type 2 diabetes mellitus with other specified complication: Secondary | ICD-10-CM | POA: Diagnosis not present

## 2022-04-17 MED ORDER — NITROGLYCERIN 0.4 MG SL SUBL: 0.4000 mg | SUBLINGUAL_TABLET | SUBLINGUAL | 3 refills | Status: AC | PRN

## 2022-04-17 NOTE — Patient Instructions (Signed)
Medication Instructions:  Your physician recommends that you continue on your current medications as directed. Please refer to the Current Medication list given to you today.   Labwork: None today  Testing/Procedures: Echo in March 2024 then appointment same day with Dr.Nishan  Follow-Up: March 2024  Any Other Special Instructions Will Be Listed Below (If Applicable).  If you need a refill on your cardiac medications before your next appointment, please call your pharmacy.

## 2022-04-18 DIAGNOSIS — H52223 Regular astigmatism, bilateral: Secondary | ICD-10-CM | POA: Diagnosis not present

## 2022-04-21 DIAGNOSIS — E782 Mixed hyperlipidemia: Secondary | ICD-10-CM | POA: Diagnosis not present

## 2022-04-21 DIAGNOSIS — I2510 Atherosclerotic heart disease of native coronary artery without angina pectoris: Secondary | ICD-10-CM | POA: Diagnosis not present

## 2022-04-21 DIAGNOSIS — D509 Iron deficiency anemia, unspecified: Secondary | ICD-10-CM | POA: Diagnosis not present

## 2022-04-21 DIAGNOSIS — I350 Nonrheumatic aortic (valve) stenosis: Secondary | ICD-10-CM | POA: Diagnosis not present

## 2022-04-21 DIAGNOSIS — I714 Abdominal aortic aneurysm, without rupture, unspecified: Secondary | ICD-10-CM | POA: Diagnosis not present

## 2022-04-21 DIAGNOSIS — J9611 Chronic respiratory failure with hypoxia: Secondary | ICD-10-CM | POA: Diagnosis not present

## 2022-04-21 DIAGNOSIS — E1169 Type 2 diabetes mellitus with other specified complication: Secondary | ICD-10-CM | POA: Diagnosis not present

## 2022-04-21 DIAGNOSIS — R911 Solitary pulmonary nodule: Secondary | ICD-10-CM | POA: Diagnosis not present

## 2022-04-21 DIAGNOSIS — K219 Gastro-esophageal reflux disease without esophagitis: Secondary | ICD-10-CM | POA: Diagnosis not present

## 2022-04-21 DIAGNOSIS — I10 Essential (primary) hypertension: Secondary | ICD-10-CM | POA: Diagnosis not present

## 2022-04-21 DIAGNOSIS — G14 Postpolio syndrome: Secondary | ICD-10-CM | POA: Diagnosis not present

## 2022-04-21 DIAGNOSIS — D72829 Elevated white blood cell count, unspecified: Secondary | ICD-10-CM | POA: Diagnosis not present

## 2022-05-01 DIAGNOSIS — J9610 Chronic respiratory failure, unspecified whether with hypoxia or hypercapnia: Secondary | ICD-10-CM | POA: Diagnosis not present

## 2022-05-01 DIAGNOSIS — J449 Chronic obstructive pulmonary disease, unspecified: Secondary | ICD-10-CM | POA: Diagnosis not present

## 2022-05-04 DIAGNOSIS — I714 Abdominal aortic aneurysm, without rupture, unspecified: Secondary | ICD-10-CM | POA: Diagnosis not present

## 2022-05-04 DIAGNOSIS — J9621 Acute and chronic respiratory failure with hypoxia: Secondary | ICD-10-CM | POA: Diagnosis not present

## 2022-05-04 DIAGNOSIS — J9622 Acute and chronic respiratory failure with hypercapnia: Secondary | ICD-10-CM | POA: Diagnosis not present

## 2022-05-16 ENCOUNTER — Other Ambulatory Visit: Payer: Self-pay | Admitting: Cardiovascular Disease

## 2022-05-29 DIAGNOSIS — J069 Acute upper respiratory infection, unspecified: Secondary | ICD-10-CM | POA: Diagnosis not present

## 2022-05-29 DIAGNOSIS — Z20822 Contact with and (suspected) exposure to covid-19: Secondary | ICD-10-CM | POA: Diagnosis not present

## 2022-05-31 DIAGNOSIS — J9610 Chronic respiratory failure, unspecified whether with hypoxia or hypercapnia: Secondary | ICD-10-CM | POA: Diagnosis not present

## 2022-05-31 DIAGNOSIS — J449 Chronic obstructive pulmonary disease, unspecified: Secondary | ICD-10-CM | POA: Diagnosis not present

## 2022-06-03 DIAGNOSIS — J9621 Acute and chronic respiratory failure with hypoxia: Secondary | ICD-10-CM | POA: Diagnosis not present

## 2022-06-03 DIAGNOSIS — J9622 Acute and chronic respiratory failure with hypercapnia: Secondary | ICD-10-CM | POA: Diagnosis not present

## 2022-06-03 DIAGNOSIS — I714 Abdominal aortic aneurysm, without rupture, unspecified: Secondary | ICD-10-CM | POA: Diagnosis not present

## 2022-06-15 ENCOUNTER — Other Ambulatory Visit: Payer: Self-pay | Admitting: Cardiovascular Disease

## 2022-07-01 DIAGNOSIS — J449 Chronic obstructive pulmonary disease, unspecified: Secondary | ICD-10-CM | POA: Diagnosis not present

## 2022-07-01 DIAGNOSIS — J9610 Chronic respiratory failure, unspecified whether with hypoxia or hypercapnia: Secondary | ICD-10-CM | POA: Diagnosis not present

## 2022-07-04 DIAGNOSIS — J9621 Acute and chronic respiratory failure with hypoxia: Secondary | ICD-10-CM | POA: Diagnosis not present

## 2022-07-04 DIAGNOSIS — I714 Abdominal aortic aneurysm, without rupture, unspecified: Secondary | ICD-10-CM | POA: Diagnosis not present

## 2022-07-04 DIAGNOSIS — J9622 Acute and chronic respiratory failure with hypercapnia: Secondary | ICD-10-CM | POA: Diagnosis not present

## 2022-07-20 ENCOUNTER — Other Ambulatory Visit: Payer: Self-pay | Admitting: *Deleted

## 2022-07-20 DIAGNOSIS — I739 Peripheral vascular disease, unspecified: Secondary | ICD-10-CM

## 2022-07-24 NOTE — Progress Notes (Signed)
Patient ID: [name], male   DOB: 07-May-1941, 82 y.o.   MRN: [id number]    81 y.o. history of distant LAD stent 2009. CRFls HTN and HLD. Activity limited by leg weakness from polio. Stays busy doing remodeling work. Sees hematiology for chronically elevated WBC no signs of lymphoma  Had EGD and colonoscopy 05/13/18 for heme positive stool and iron deficiency anemia Noted external Hemorrhoids and one small polyp removed Also had an area of gastric angiodysplasia Rx with clip/cautery  Myovue normal no ischemia 07/02/19 Echo with mild / moderate AS 06/25/19   Rx for bladder cancer. CT showed 6 cm AAA  Seen by Dr Scot Dock with open repair 10/14/19   [facility] Presented with nausea and vomiting with hypercapnic respiratory failure requiring intubation post cath Cath with patent LAD stents no obstructive CAD moderate AS TTE mean gradient 21 peak 39 and mean at cath 05/20/21 20 mmHg ? Neuromuscular post polio issues referred to [facility] d/c on 2L [name] and non invasive ventilation NIV  TTE 08/15/21 EF 60-65% mild LVH moderate AS mean gradient 15 peak 30.7 mmHg DVI 0.41 AVA 1.3 cm2  No cardiac complaints      ROS: Denies fever, malais, weight loss, blurry vision, decreased visual acuity, cough, sputum, SOB, hemoptysis, pleuritic pain, palpitaitons, heartburn, abdominal pain, melena, lower extremity edema, claudication, or rash.  All other systems reviewed and negative  General: BP (!) 158/80   Pulse 64   Ht '5\' 6"'$  (1.676 m)   Wt 200 lb 3.2 oz (90.8 kg)   SpO2 97%   BMI 32.31 kg/m   Affect appropriate Healthy:  appears stated age 89: normal Neck supple with no adenopathy JVP normal no bruits no thyromegaly Lungs clear with no wheezing and good diaphragmatic motion Heart:  S1/S2 AS murmur, no rub, gallop or click PMI normal Abdomen: benighn, BS positve, no tenderness, no AAA no bruit.  No HSM or HJR Distal pulses intact with no bruits Plus one bilateral edema Neuro  non-focal Skin warm and dry Polio with atrophied right shoulder and arm  Post left TKR    Current Outpatient Medications  Medication Sig Dispense Refill   aspirin EC 81 MG tablet Take 81 mg by mouth daily.     clopidogrel (PLAVIX) 75 MG tablet Take 1 tablet (75 mg total) by mouth daily. Please keep scheduled appointment for future refills. Thank you. 90 tablet 0   ELDERBERRY PO Take 1 tablet by mouth daily.      ferrous sulfate 325 (65 FE) MG tablet Take 1 tablet (325 mg total) by mouth 2 (two) times daily with a meal.  3   hydrOXYzine (ATARAX) 10 MG tablet hydroxyzine HCl 10 mg tablet  Take 1 tablet every 8 hours by oral route as needed.  for severe itching     levocetirizine (XYZAL) 5 MG tablet Take 5 mg by mouth daily as needed for allergies.     nitroGLYCERIN (NITROSTAT) 0.4 MG SL tablet Place 1 tablet (0.4 mg total) under the tongue every 5 (five) minutes x 3 doses as needed for chest pain. 25 tablet 3   ondansetron (ZOFRAN) 4 MG tablet Take 8 mg by mouth 2 (two) times daily.     pantoprazole (PROTONIX) 40 MG tablet Take 1 tablet (40 mg total) by mouth daily. Take 30 min before meals     Probiotic Product (PROBIOTIC PO) Take 1 tablet by mouth daily.     atorvastatin (LIPITOR) 40 MG tablet Take 1  tablet (40 mg total) by mouth daily. 90 tablet 3   No current facility-administered medications for this visit.    Allergies  Tramadol  Electrocardiogram:   08/07/2022 SR rate 69  RBBB LAD no changes   Assessment and Plan  CAD: Distant LAD stent Cath 05/20/21 patent no osbstrucitve disease medical Rx   HTN: Well controlled.  Continue current medications and low sodium Dash type diet.    Chol:   LDL 75 most recent labs continue  lipitor 40 mg daily    Aortic Stenosis :  TTE reviewed  08/15/21 stable moderate AS normal EF f/u TTE March 2024   Edema:  Dependant -Low dose diuretic HCTZ 12.5 mg  PRN Low sodium diet .   Anemia:  See notes regarding Endo/Colon on 05/31/18  Continue iron  and prilosec   AAA:  Post repair Dr Scot Dock 10/14/19 f/u US per [facility] Normal ABI's 07/14/21   Pulmonary:  post polio hypercapnic respiratory failure with home oxygen and NIV F/U Dr Posey Pronto  Encouraged him to keep new appointmet  TTE March 2024   F/U in 6 months    [name]

## 2022-08-01 DIAGNOSIS — C678 Malignant neoplasm of overlapping sites of bladder: Secondary | ICD-10-CM | POA: Diagnosis not present

## 2022-08-01 DIAGNOSIS — J449 Chronic obstructive pulmonary disease, unspecified: Secondary | ICD-10-CM | POA: Diagnosis not present

## 2022-08-01 DIAGNOSIS — J9610 Chronic respiratory failure, unspecified whether with hypoxia or hypercapnia: Secondary | ICD-10-CM | POA: Diagnosis not present

## 2022-08-03 ENCOUNTER — Ambulatory Visit (HOSPITAL_COMMUNITY)
Admission: RE | Admit: 2022-08-03 | Discharge: 2022-08-03 | Disposition: A | Discharge status: Home / Self Care | Payer: Medicare HMO | Source: Ambulatory Visit | Attending: Vascular Surgery | Admitting: Vascular Surgery

## 2022-08-03 ENCOUNTER — Encounter: Payer: Self-pay | Admitting: Vascular Surgery

## 2022-08-03 ENCOUNTER — Ambulatory Visit: Payer: Medicare HMO | Admitting: Vascular Surgery

## 2022-08-03 VITALS — BP 163/80 | HR 66 | Temp 98.3°F | Resp 20 | Ht 66.0 in | Wt 202.0 lb

## 2022-08-03 DIAGNOSIS — I7143 Infrarenal abdominal aortic aneurysm, without rupture: Secondary | ICD-10-CM | POA: Diagnosis not present

## 2022-08-03 DIAGNOSIS — Z48812 Encounter for surgical aftercare following surgery on the circulatory system: Secondary | ICD-10-CM

## 2022-08-03 DIAGNOSIS — I739 Peripheral vascular disease, unspecified: Secondary | ICD-10-CM | POA: Insufficient documentation

## 2022-08-03 LAB — VAS US ABI WITH/WO TBI
Left ABI: 1.05
Right ABI: 0.99

## 2022-08-03 NOTE — Progress Notes (Signed)
REASON FOR VISIT:   Follow-up after open repair of juxtarenal aneurysm.  MEDICAL ISSUES:   S/P OPEN REPAIR OF JUXTARENAL ANEURYSM: The patient is doing well and has excellent pulses in both feet.  He has no evidence of an aneurysm.  I do not think routine follow-up is necessary at this point.  I have told him that I will be retiring in September and he feels comfortable being seen on a as needed basis.  I encouraged him to stay as active as possible.  He will call if he develops any new vascular issues.  HPI:   [name] is a pleasant 82 y.o. male who was found to have a 6 cm juxtarenal aneurysm during a workup for hematuria.  He subsequently had his bladder cancer removed and then underwent elective repair of his juxtarenal aneurysm on 10/14/2019.  His wife had felt strongly about him being seen on a yearly basis and still within seeing him back with ABIs.  Since I saw him last, he denies any history of claudication, rest pain, or nonhealing ulcers.  He denies any abdominal pain or back pain.  Past Medical History:  Diagnosis Date   AAA (abdominal aortic aneurysm) ([place])    Bladder cancer ([place])    s/p TURBT 09/26/19   Blood transfusion without reported diagnosis    Coronary artery disease    stents placed   GERD (gastroesophageal reflux disease)    HOH (hard of hearing)    Hyperlipidemia    Hypertension    Iron deficiency anemia 05/14/2018   Polio    hx of. right arm atrophy    Family History  Problem Relation Age of Onset   Heart attack Brother 68       died   Hypertension Mother    Heart attack Father     SOCIAL HISTORY: Social History   Tobacco Use   Smoking status: Former    Packs/day: 1.00    Years: 15.00    Total pack years: 15.00    Types: Cigarettes    Quit date: 06/12/2004    Years since quitting: 18.1   Smokeless tobacco: Never  Substance Use Topics   Alcohol use: No    Alcohol/week: 0.0 standard drinks of alcohol    Allergies  Allergen  Reactions   Tramadol Other (See Comments)    Excessive sleepiness/difficulty waking patient    Current Outpatient Medications  Medication Sig Dispense Refill   aspirin EC 81 MG tablet Take 81 mg by mouth daily.     atorvastatin (LIPITOR) 40 MG tablet Take 1 tablet by mouth once daily 90 tablet 0   clopidogrel (PLAVIX) 75 MG tablet Take 1 tablet (75 mg total) by mouth daily. Please keep scheduled appointment for future refills. Thank you. 90 tablet 0   ELDERBERRY PO Take 1 tablet by mouth daily.      ferrous sulfate 325 (65 FE) MG tablet Take 1 tablet (325 mg total) by mouth 2 (two) times daily with a meal.  3   hydrOXYzine (ATARAX) 10 MG tablet hydroxyzine HCl 10 mg tablet  Take 1 tablet every 8 hours by oral route as needed.  for severe itching     nitroGLYCERIN (NITROSTAT) 0.4 MG SL tablet Place 1 tablet (0.4 mg total) under the tongue every 5 (five) minutes x 3 doses as needed for chest pain. 25 tablet 3   ondansetron (ZOFRAN) 4 MG tablet Take 8 mg by mouth 2 (two) times daily.  pantoprazole (PROTONIX) 40 MG tablet Take 1 tablet (40 mg total) by mouth daily. Take 30 min before meals     Probiotic Product (PROBIOTIC PO) Take 1 tablet by mouth daily.     No current facility-administered medications for this visit.    REVIEW OF SYSTEMS:  [X]$  denotes positive finding, [ ]$  denotes negative finding Cardiac  Comments:  Chest pain or chest pressure:    Shortness of breath upon exertion:    Short of breath when lying flat:    Irregular heart rhythm:        Vascular    Pain in calf, thigh, or hip brought on by ambulation:    Pain in feet at night that wakes you up from your sleep:     Blood clot in your veins:    Leg swelling:         Pulmonary    Oxygen at home:    Productive cough:     Wheezing:         Neurologic    Sudden weakness in arms or legs:     Sudden numbness in arms or legs:     Sudden onset of difficulty speaking or slurred speech:    Temporary loss of vision  in one eye:     Problems with dizziness:         Gastrointestinal    Blood in stool:     Vomited blood:         Genitourinary    Burning when urinating:     Blood in urine:        Psychiatric    Major depression:         Hematologic    Bleeding problems:    Problems with blood clotting too easily:        Skin    Rashes or ulcers:        Constitutional    Fever or chills:     PHYSICAL EXAM:   Vitals:   08/03/22 1321  BP: (!) 163/80  Pulse: 66  Resp: 20  Temp: 98.3 F (36.8 C)  SpO2: 92%  Weight: 202 lb (91.6 kg)  Height: 5' 6"$  (1.676 m)    GENERAL: The patient is a well-nourished male, in no acute distress. The vital signs are documented above. CARDIAC: There is a regular rate and rhythm.  He does have a systolic murmur. VASCULAR: I do not detect carotid bruits. He has palpable dorsalis pedis and posterior tibial pulses bilaterally. PULMONARY: There is good air exchange bilaterally without wheezing or rales. ABDOMEN: Soft and non-tender with normal pitched bowel sounds.  There is no evidence of a hernia. MUSCULOSKELETAL: There are no major deformities or cyanosis. NEUROLOGIC: No focal weakness or paresthesias are detected. SKIN: There are no ulcers or rashes noted. PSYCHIATRIC: The patient has a normal affect.  DATA:    ARTERIAL DOPPLER STUDY: I have independently interpreted his arterial Doppler study today.  On the right side there is a biphasic posterior tibial and dorsalis pedis signal.  ABI is 100%.  Toe pressures 129 mmHg.  On the left side there is a biphasic posterior tibial and dorsalis pedis signal.  ABIs 100%.  Toe pressures 136 mmHg.  Deitra Mayo [facility] [phone]

## 2022-08-04 DIAGNOSIS — J9622 Acute and chronic respiratory failure with hypercapnia: Secondary | ICD-10-CM | POA: Diagnosis not present

## 2022-08-04 DIAGNOSIS — I714 Abdominal aortic aneurysm, without rupture, unspecified: Secondary | ICD-10-CM | POA: Diagnosis not present

## 2022-08-04 DIAGNOSIS — J9621 Acute and chronic respiratory failure with hypoxia: Secondary | ICD-10-CM | POA: Diagnosis not present

## 2022-08-07 ENCOUNTER — Ambulatory Visit: Payer: Medicare HMO | Attending: Cardiovascular Disease | Admitting: Cardiovascular Disease

## 2022-08-07 ENCOUNTER — Encounter: Payer: Self-pay | Admitting: Cardiovascular Disease

## 2022-08-07 VITALS — BP 158/80 | HR 64 | Ht 66.0 in | Wt 200.2 lb

## 2022-08-07 DIAGNOSIS — I739 Peripheral vascular disease, unspecified: Secondary | ICD-10-CM | POA: Diagnosis not present

## 2022-08-07 DIAGNOSIS — I10 Essential (primary) hypertension: Secondary | ICD-10-CM

## 2022-08-07 DIAGNOSIS — I350 Nonrheumatic aortic (valve) stenosis: Secondary | ICD-10-CM

## 2022-08-07 MED ORDER — ATORVASTATIN CALCIUM 40 MG PO TABS: 40.0000 mg | ORAL_TABLET | Freq: Every day | ORAL | 3 refills | Status: DC

## 2022-08-07 NOTE — Patient Instructions (Signed)
Medication Instructions:  Your physician recommends that you continue on your current medications as directed. Please refer to the Current Medication list given to you today.  *If you need a refill on your cardiac medications before your next appointment, please call your pharmacy*   Lab Work: NONE   If you have labs (blood work) drawn today and your tests are completely normal, you will receive your results only by: [place] (if you have MyChart) OR A paper copy in the mail If you have any lab test that is abnormal or we need to change your treatment, we will call you to review the results.   Testing/Procedures: NONE    Follow-Up: At [facility], you and your health needs are our priority.  As part of our continuing mission to provide you with exceptional heart care, we have created designated Provider Care Teams.  These Care Teams include your primary Cardiologist (physician) and Advanced Practice Providers (APPs -  Physician Assistants and Nurse Practitioners) who all work together to provide you with the care you need, when you need it.  We recommend signing up for the patient portal called "MyChart".  Sign up information is provided on this After Visit Summary.  MyChart is used to connect with patients for Virtual Visits (Telemedicine).  Patients are able to view lab/test results, encounter notes, upcoming appointments, etc.  Non-urgent messages can be sent to your provider as well.   To learn more about what you can do with MyChart, go to [email or web address].    Your next appointment:   6 month(s)  Provider:   You may see [name], MD or one of the following Advanced Practice Providers on your designated Care Team:   Bernerd Pho, PA-C  Ermalinda Barrios, PA-C     Other Instructions Thank you for choosing [facility]!

## 2022-08-18 ENCOUNTER — Ambulatory Visit (HOSPITAL_COMMUNITY)
Admission: RE | Admit: 2022-08-18 | Discharge: 2022-08-18 | Disposition: A | Discharge status: Home / Self Care | Payer: Medicare HMO | Source: Ambulatory Visit | Attending: Cardiovascular Disease | Admitting: Cardiovascular Disease

## 2022-08-18 DIAGNOSIS — I350 Nonrheumatic aortic (valve) stenosis: Secondary | ICD-10-CM | POA: Diagnosis not present

## 2022-08-18 LAB — ECHOCARDIOGRAM COMPLETE
AR max vel: 1.06 cm2
AV Area VTI: 1.17 cm2
AV Area mean vel: 1.08 cm2
AV Mean grad: 25.3 mmHg
AV Peak grad: 46.8 mmHg
Ao pk vel: 3.42 m/s
Area-P 1/2: 2.45 cm2
S' Lateral: 2.7 cm

## 2022-08-18 NOTE — Progress Notes (Signed)
*  PRELIMINARY RESULTS* Echocardiogram 2D Echocardiogram has been performed.  [name] 08/18/2022, 12:37 PM

## 2022-08-21 ENCOUNTER — Telehealth: Payer: Self-pay

## 2022-08-21 NOTE — Telephone Encounter (Signed)
-----   Message from Michaelyn Barter, RN sent at 08/21/2022 12:26 PM EDT -----  ----- Message ----- From: Josue Hector, MD Sent: 08/20/2022   6:01 PM EDT To: Michaelyn Barter, RN  Stable moderate AS f/u echo in a year

## 2022-08-21 NOTE — Telephone Encounter (Signed)
Patient notified and verbalized understanding. Patient had no questions or concerns at this time. PCP copied 

## 2022-08-23 DIAGNOSIS — E785 Hyperlipidemia, unspecified: Secondary | ICD-10-CM | POA: Diagnosis not present

## 2022-08-23 DIAGNOSIS — I10 Essential (primary) hypertension: Secondary | ICD-10-CM | POA: Diagnosis not present

## 2022-08-23 DIAGNOSIS — J302 Other seasonal allergic rhinitis: Secondary | ICD-10-CM | POA: Diagnosis not present

## 2022-08-23 DIAGNOSIS — Z8249 Family history of ischemic heart disease and other diseases of the circulatory system: Secondary | ICD-10-CM | POA: Diagnosis not present

## 2022-08-23 DIAGNOSIS — G4733 Obstructive sleep apnea (adult) (pediatric): Secondary | ICD-10-CM | POA: Diagnosis not present

## 2022-08-23 DIAGNOSIS — I672 Cerebral atherosclerosis: Secondary | ICD-10-CM | POA: Diagnosis not present

## 2022-08-23 DIAGNOSIS — E669 Obesity, unspecified: Secondary | ICD-10-CM | POA: Diagnosis not present

## 2022-08-23 DIAGNOSIS — I700 Atherosclerosis of aorta: Secondary | ICD-10-CM | POA: Diagnosis not present

## 2022-08-23 DIAGNOSIS — I2510 Atherosclerotic heart disease of native coronary artery without angina pectoris: Secondary | ICD-10-CM | POA: Diagnosis not present

## 2022-08-23 DIAGNOSIS — L299 Pruritus, unspecified: Secondary | ICD-10-CM | POA: Diagnosis not present

## 2022-08-23 DIAGNOSIS — I252 Old myocardial infarction: Secondary | ICD-10-CM | POA: Diagnosis not present

## 2022-08-23 DIAGNOSIS — Z87891 Personal history of nicotine dependence: Secondary | ICD-10-CM | POA: Diagnosis not present

## 2022-08-30 DIAGNOSIS — J9610 Chronic respiratory failure, unspecified whether with hypoxia or hypercapnia: Secondary | ICD-10-CM | POA: Diagnosis not present

## 2022-08-30 DIAGNOSIS — J449 Chronic obstructive pulmonary disease, unspecified: Secondary | ICD-10-CM | POA: Diagnosis not present

## 2022-09-02 DIAGNOSIS — J9621 Acute and chronic respiratory failure with hypoxia: Secondary | ICD-10-CM | POA: Diagnosis not present

## 2022-09-02 DIAGNOSIS — I714 Abdominal aortic aneurysm, without rupture, unspecified: Secondary | ICD-10-CM | POA: Diagnosis not present

## 2022-09-02 DIAGNOSIS — J9622 Acute and chronic respiratory failure with hypercapnia: Secondary | ICD-10-CM | POA: Diagnosis not present

## 2022-09-12 ENCOUNTER — Other Ambulatory Visit: Payer: Self-pay | Admitting: Cardiovascular Disease

## 2022-09-30 DIAGNOSIS — J9610 Chronic respiratory failure, unspecified whether with hypoxia or hypercapnia: Secondary | ICD-10-CM | POA: Diagnosis not present

## 2022-09-30 DIAGNOSIS — J449 Chronic obstructive pulmonary disease, unspecified: Secondary | ICD-10-CM | POA: Diagnosis not present

## 2022-10-03 DIAGNOSIS — J9621 Acute and chronic respiratory failure with hypoxia: Secondary | ICD-10-CM | POA: Diagnosis not present

## 2022-10-03 DIAGNOSIS — J9622 Acute and chronic respiratory failure with hypercapnia: Secondary | ICD-10-CM | POA: Diagnosis not present

## 2022-10-03 DIAGNOSIS — I714 Abdominal aortic aneurysm, without rupture, unspecified: Secondary | ICD-10-CM | POA: Diagnosis not present

## 2022-10-17 DIAGNOSIS — E782 Mixed hyperlipidemia: Secondary | ICD-10-CM | POA: Diagnosis not present

## 2022-10-17 DIAGNOSIS — E1169 Type 2 diabetes mellitus with other specified complication: Secondary | ICD-10-CM | POA: Diagnosis not present

## 2022-10-18 LAB — LAB REPORT - SCANNED
A1c: 6.2
Albumin, Urine POC: 8.7
Creatinine, Urine.: 67
EGFR: 106

## 2022-10-23 DIAGNOSIS — Z0001 Encounter for general adult medical examination with abnormal findings: Secondary | ICD-10-CM | POA: Diagnosis not present

## 2022-10-23 DIAGNOSIS — I350 Nonrheumatic aortic (valve) stenosis: Secondary | ICD-10-CM | POA: Diagnosis not present

## 2022-10-23 DIAGNOSIS — D509 Iron deficiency anemia, unspecified: Secondary | ICD-10-CM | POA: Diagnosis not present

## 2022-10-23 DIAGNOSIS — K219 Gastro-esophageal reflux disease without esophagitis: Secondary | ICD-10-CM | POA: Diagnosis not present

## 2022-10-23 DIAGNOSIS — I2510 Atherosclerotic heart disease of native coronary artery without angina pectoris: Secondary | ICD-10-CM | POA: Diagnosis not present

## 2022-10-23 DIAGNOSIS — E1169 Type 2 diabetes mellitus with other specified complication: Secondary | ICD-10-CM | POA: Diagnosis not present

## 2022-10-23 DIAGNOSIS — I10 Essential (primary) hypertension: Secondary | ICD-10-CM | POA: Diagnosis not present

## 2022-10-23 DIAGNOSIS — E782 Mixed hyperlipidemia: Secondary | ICD-10-CM | POA: Diagnosis not present

## 2022-10-23 DIAGNOSIS — E870 Hyperosmolality and hypernatremia: Secondary | ICD-10-CM | POA: Diagnosis not present

## 2022-10-23 DIAGNOSIS — J9611 Chronic respiratory failure with hypoxia: Secondary | ICD-10-CM | POA: Diagnosis not present

## 2022-10-23 DIAGNOSIS — D72829 Elevated white blood cell count, unspecified: Secondary | ICD-10-CM | POA: Diagnosis not present

## 2022-10-23 DIAGNOSIS — G14 Postpolio syndrome: Secondary | ICD-10-CM | POA: Diagnosis not present

## 2022-10-30 DIAGNOSIS — J449 Chronic obstructive pulmonary disease, unspecified: Secondary | ICD-10-CM | POA: Diagnosis not present

## 2022-10-30 DIAGNOSIS — J9610 Chronic respiratory failure, unspecified whether with hypoxia or hypercapnia: Secondary | ICD-10-CM | POA: Diagnosis not present

## 2022-11-02 DIAGNOSIS — J9621 Acute and chronic respiratory failure with hypoxia: Secondary | ICD-10-CM | POA: Diagnosis not present

## 2022-11-02 DIAGNOSIS — J9622 Acute and chronic respiratory failure with hypercapnia: Secondary | ICD-10-CM | POA: Diagnosis not present

## 2022-11-02 DIAGNOSIS — I714 Abdominal aortic aneurysm, without rupture, unspecified: Secondary | ICD-10-CM | POA: Diagnosis not present

## 2022-11-30 DIAGNOSIS — J449 Chronic obstructive pulmonary disease, unspecified: Secondary | ICD-10-CM | POA: Diagnosis not present

## 2022-11-30 DIAGNOSIS — J9610 Chronic respiratory failure, unspecified whether with hypoxia or hypercapnia: Secondary | ICD-10-CM | POA: Diagnosis not present

## 2022-12-03 DIAGNOSIS — J9621 Acute and chronic respiratory failure with hypoxia: Secondary | ICD-10-CM | POA: Diagnosis not present

## 2022-12-03 DIAGNOSIS — J9622 Acute and chronic respiratory failure with hypercapnia: Secondary | ICD-10-CM | POA: Diagnosis not present

## 2022-12-03 DIAGNOSIS — I714 Abdominal aortic aneurysm, without rupture, unspecified: Secondary | ICD-10-CM | POA: Diagnosis not present

## 2022-12-30 DIAGNOSIS — J9610 Chronic respiratory failure, unspecified whether with hypoxia or hypercapnia: Secondary | ICD-10-CM | POA: Diagnosis not present

## 2022-12-30 DIAGNOSIS — J449 Chronic obstructive pulmonary disease, unspecified: Secondary | ICD-10-CM | POA: Diagnosis not present

## 2023-01-02 DIAGNOSIS — I714 Abdominal aortic aneurysm, without rupture, unspecified: Secondary | ICD-10-CM | POA: Diagnosis not present

## 2023-01-02 DIAGNOSIS — J9622 Acute and chronic respiratory failure with hypercapnia: Secondary | ICD-10-CM | POA: Diagnosis not present

## 2023-01-02 DIAGNOSIS — J9621 Acute and chronic respiratory failure with hypoxia: Secondary | ICD-10-CM | POA: Diagnosis not present

## 2023-01-30 DIAGNOSIS — J449 Chronic obstructive pulmonary disease, unspecified: Secondary | ICD-10-CM | POA: Diagnosis not present

## 2023-01-30 DIAGNOSIS — J9610 Chronic respiratory failure, unspecified whether with hypoxia or hypercapnia: Secondary | ICD-10-CM | POA: Diagnosis not present

## 2023-02-02 DIAGNOSIS — J9621 Acute and chronic respiratory failure with hypoxia: Secondary | ICD-10-CM | POA: Diagnosis not present

## 2023-02-02 DIAGNOSIS — J9622 Acute and chronic respiratory failure with hypercapnia: Secondary | ICD-10-CM | POA: Diagnosis not present

## 2023-02-02 DIAGNOSIS — I714 Abdominal aortic aneurysm, without rupture, unspecified: Secondary | ICD-10-CM | POA: Diagnosis not present

## 2023-02-22 DIAGNOSIS — X32XXXD Exposure to sunlight, subsequent encounter: Secondary | ICD-10-CM | POA: Diagnosis not present

## 2023-02-22 DIAGNOSIS — L570 Actinic keratosis: Secondary | ICD-10-CM | POA: Diagnosis not present

## 2023-02-26 DIAGNOSIS — U071 COVID-19: Secondary | ICD-10-CM | POA: Diagnosis not present

## 2023-03-02 DIAGNOSIS — J9610 Chronic respiratory failure, unspecified whether with hypoxia or hypercapnia: Secondary | ICD-10-CM | POA: Diagnosis not present

## 2023-03-02 DIAGNOSIS — J449 Chronic obstructive pulmonary disease, unspecified: Secondary | ICD-10-CM | POA: Diagnosis not present

## 2023-03-05 DIAGNOSIS — J9621 Acute and chronic respiratory failure with hypoxia: Secondary | ICD-10-CM | POA: Diagnosis not present

## 2023-03-05 DIAGNOSIS — I714 Abdominal aortic aneurysm, without rupture, unspecified: Secondary | ICD-10-CM | POA: Diagnosis not present

## 2023-03-05 DIAGNOSIS — J9622 Acute and chronic respiratory failure with hypercapnia: Secondary | ICD-10-CM | POA: Diagnosis not present

## 2023-03-05 DIAGNOSIS — R059 Cough, unspecified: Secondary | ICD-10-CM | POA: Diagnosis not present

## 2023-03-05 DIAGNOSIS — U099 Post covid-19 condition, unspecified: Secondary | ICD-10-CM | POA: Diagnosis not present

## 2023-04-01 DIAGNOSIS — J9610 Chronic respiratory failure, unspecified whether with hypoxia or hypercapnia: Secondary | ICD-10-CM | POA: Diagnosis not present

## 2023-04-01 DIAGNOSIS — J449 Chronic obstructive pulmonary disease, unspecified: Secondary | ICD-10-CM | POA: Diagnosis not present

## 2023-04-04 DIAGNOSIS — Z23 Encounter for immunization: Secondary | ICD-10-CM | POA: Diagnosis not present

## 2023-04-04 DIAGNOSIS — J9621 Acute and chronic respiratory failure with hypoxia: Secondary | ICD-10-CM | POA: Diagnosis not present

## 2023-04-04 DIAGNOSIS — J9622 Acute and chronic respiratory failure with hypercapnia: Secondary | ICD-10-CM | POA: Diagnosis not present

## 2023-04-04 DIAGNOSIS — I714 Abdominal aortic aneurysm, without rupture, unspecified: Secondary | ICD-10-CM | POA: Diagnosis not present

## 2023-04-17 DIAGNOSIS — E782 Mixed hyperlipidemia: Secondary | ICD-10-CM | POA: Diagnosis not present

## 2023-04-17 DIAGNOSIS — E1169 Type 2 diabetes mellitus with other specified complication: Secondary | ICD-10-CM | POA: Diagnosis not present

## 2023-04-18 LAB — LAB REPORT - SCANNED
A1c: 6.4
Albumin, Urine POC: 6.5
Creatinine, POC: 68.8 mg/dL
EGFR: 94
Microalb Creat Ratio: 9

## 2023-04-23 ENCOUNTER — Other Ambulatory Visit: Payer: Self-pay

## 2023-04-23 ENCOUNTER — Encounter (HOSPITAL_COMMUNITY): Payer: Self-pay | Admitting: *Deleted

## 2023-04-23 ENCOUNTER — Emergency Department (HOSPITAL_COMMUNITY): Payer: Medicare HMO

## 2023-04-23 ENCOUNTER — Emergency Department (HOSPITAL_COMMUNITY)
Admission: EM | Admit: 2023-04-23 | Discharge: 2023-04-23 | Disposition: A | Discharge status: Home / Self Care | Payer: Medicare HMO | Attending: Emergency Medicine | Admitting: Emergency Medicine

## 2023-04-23 DIAGNOSIS — M25512 Pain in left shoulder: Secondary | ICD-10-CM | POA: Diagnosis not present

## 2023-04-23 DIAGNOSIS — I10 Essential (primary) hypertension: Secondary | ICD-10-CM | POA: Diagnosis not present

## 2023-04-23 DIAGNOSIS — Z7902 Long term (current) use of antithrombotics/antiplatelets: Secondary | ICD-10-CM | POA: Diagnosis not present

## 2023-04-23 DIAGNOSIS — R930 Abnormal findings on diagnostic imaging of skull and head, not elsewhere classified: Secondary | ICD-10-CM | POA: Insufficient documentation

## 2023-04-23 DIAGNOSIS — I2510 Atherosclerotic heart disease of native coronary artery without angina pectoris: Secondary | ICD-10-CM | POA: Insufficient documentation

## 2023-04-23 DIAGNOSIS — Z79899 Other long term (current) drug therapy: Secondary | ICD-10-CM | POA: Diagnosis not present

## 2023-04-23 DIAGNOSIS — S0990XA Unspecified injury of head, initial encounter: Secondary | ICD-10-CM | POA: Diagnosis not present

## 2023-04-23 DIAGNOSIS — I672 Cerebral atherosclerosis: Secondary | ICD-10-CM | POA: Diagnosis not present

## 2023-04-23 DIAGNOSIS — Z951 Presence of aortocoronary bypass graft: Secondary | ICD-10-CM | POA: Insufficient documentation

## 2023-04-23 DIAGNOSIS — M25519 Pain in unspecified shoulder: Secondary | ICD-10-CM | POA: Diagnosis not present

## 2023-04-23 DIAGNOSIS — Z7982 Long term (current) use of aspirin: Secondary | ICD-10-CM | POA: Insufficient documentation

## 2023-04-23 DIAGNOSIS — W010XXA Fall on same level from slipping, tripping and stumbling without subsequent striking against object, initial encounter: Secondary | ICD-10-CM | POA: Diagnosis not present

## 2023-04-23 DIAGNOSIS — Z743 Need for continuous supervision: Secondary | ICD-10-CM | POA: Diagnosis not present

## 2023-04-23 DIAGNOSIS — S42022A Displaced fracture of shaft of left clavicle, initial encounter for closed fracture: Secondary | ICD-10-CM | POA: Diagnosis not present

## 2023-04-23 DIAGNOSIS — S4992XA Unspecified injury of left shoulder and upper arm, initial encounter: Secondary | ICD-10-CM | POA: Diagnosis present

## 2023-04-23 DIAGNOSIS — M25559 Pain in unspecified hip: Secondary | ICD-10-CM | POA: Diagnosis not present

## 2023-04-23 DIAGNOSIS — S42032A Displaced fracture of lateral end of left clavicle, initial encounter for closed fracture: Secondary | ICD-10-CM | POA: Diagnosis not present

## 2023-04-23 DIAGNOSIS — S199XXA Unspecified injury of neck, initial encounter: Secondary | ICD-10-CM | POA: Diagnosis not present

## 2023-04-23 DIAGNOSIS — S42002A Fracture of unspecified part of left clavicle, initial encounter for closed fracture: Secondary | ICD-10-CM | POA: Diagnosis not present

## 2023-04-23 DIAGNOSIS — W19XXXA Unspecified fall, initial encounter: Secondary | ICD-10-CM | POA: Diagnosis not present

## 2023-04-23 DIAGNOSIS — R0989 Other specified symptoms and signs involving the circulatory and respiratory systems: Secondary | ICD-10-CM | POA: Diagnosis not present

## 2023-04-23 DIAGNOSIS — M160 Bilateral primary osteoarthritis of hip: Secondary | ICD-10-CM | POA: Diagnosis not present

## 2023-04-23 MED ORDER — HYDROCODONE-ACETAMINOPHEN 5-325 MG PO TABS
1.0000 | ORAL_TABLET | Freq: Once | ORAL | Status: AC
  Administered 2023-04-23: 1 via ORAL
  Filled 2023-04-23: qty 1

## 2023-04-23 MED ORDER — HYDROCODONE-ACETAMINOPHEN 5-325 MG PO TABS: 1.0000 | ORAL_TABLET | ORAL | 0 refills | Status: AC | PRN

## 2023-04-23 MED ORDER — ACETAMINOPHEN 325 MG PO TABS
650.0000 mg | ORAL_TABLET | Freq: Once | ORAL | Status: AC
  Administered 2023-04-23: 650 mg via ORAL
  Filled 2023-04-23: qty 2

## 2023-04-23 MED ORDER — BACITRACIN ZINC 500 UNIT/GM EX OINT
TOPICAL_OINTMENT | CUTANEOUS | Status: AC
  Administered 2023-04-23: 1
  Filled 2023-04-23: qty 0.9

## 2023-04-23 NOTE — ED Triage Notes (Signed)
Pt brought in by [facility] from home with c/o fall today after tripping over carpet and fell into the wall. Skin tear to left shoulder from fall, pain 2/10. Pt takes Plavix. Wife reported to EMS that after the fall as he was laying up against the wall there was a period of time that he wasn't responding to her. BP 190/81, HR 74, O2 sat 95% for EMS. Pt ambulatory to EMS stretcher after the fall.

## 2023-04-23 NOTE — Discharge Instructions (Signed)
You were seen for your broken collar bone in the emergency department.    At home, please keep the splint on until you are able to see the orthopedic surgeons.     Take Tylenol for the pain.  You may also take the norco we have prescribed you for any breakthrough pain that may have.  Do not take this before driving or operating heavy machinery.  Do not take this medication with alcohol.    Follow-up with the orthopedic surgeons within the next week.    Return immediately to the emergency department if you experience any of the following: Severe pain, numbness or weakness of your extremity, or any other concerning symptoms.     Thank you for visiting our Emergency Department. It was a pleasure taking care of you today.

## 2023-04-23 NOTE — ED Provider Notes (Signed)
[facility] AT [facility] Provider Note   CSN: [id number] Arrival date & time: 04/23/23  1004     History  Chief Complaint  Patient presents with   [name] is a 82 y.o. male.  82 year old male with history of polio with right upper extremity weakness and CAD status post PCI on Plavix who presents to the emergency department after a fall.  Patient reports he tripped on the rug and fell onto his left side.  Says his left shoulder is hurting him at this point in time.  Wife thinks that he hit his head as well.  No loss of consciousness.  No nausea or vomiting afterwards.       Home Medications Prior to Admission medications   Medication Sig Start Date End Date Taking? Authorizing Provider  HYDROcodone-acetaminophen (NORCO/VICODIN) 5-325 MG tablet Take 1 tablet by mouth every 4 (four) hours as needed. 04/23/23  Yes Rondel Baton, MD  aspirin EC 81 MG tablet Take 81 mg by mouth daily.    [provider]  atorvastatin (LIPITOR) 40 MG tablet Take 1 tablet (40 mg total) by mouth daily. 08/07/22   Wendall Stade, MD  clopidogrel (PLAVIX) 75 MG tablet TAKE 1 TABLET BY MOUTH ONCE DAILY . APPOINTMENT REQUIRED FOR FUTURE REFILLS 09/12/22   Wendall Stade, MD  ELDERBERRY PO Take 1 tablet by mouth daily.     [provider]  ferrous sulfate 325 (65 FE) MG tablet Take 1 tablet (325 mg total) by mouth 2 (two) times daily with a meal. 08/08/19   Tat, Onalee Hua, MD  hydrOXYzine (ATARAX) 10 MG tablet hydroxyzine HCl 10 mg tablet  Take 1 tablet every 8 hours by oral route as needed.  for severe itching    [provider]  levocetirizine (XYZAL) 5 MG tablet Take 5 mg by mouth daily as needed for allergies.    [provider]  nitroGLYCERIN (NITROSTAT) 0.4 MG SL tablet Place 1 tablet (0.4 mg total) under the tongue every 5 (five) minutes x 3 doses as needed for chest pain. 04/17/22   Wendall Stade, MD  ondansetron  (ZOFRAN) 4 MG tablet Take 8 mg by mouth 2 (two) times daily. 05/16/21   [provider]  pantoprazole (PROTONIX) 40 MG tablet Take 1 tablet (40 mg total) by mouth daily. Take 30 min before meals 09/07/19   Glade Lloyd, MD  Probiotic Product (PROBIOTIC PO) Take 1 tablet by mouth daily.    [provider]      Allergies    Tramadol    Review of Systems   Review of Systems  Physical Exam Updated Vital Signs BP (!) 147/88   Pulse 90   Temp 97.9 F (36.6 C) (Oral)   Resp (!) 30   Ht 5\' 6"  (1.676 m)   Wt 89.8 kg   SpO2 93%   BMI 31.96 kg/m  Physical Exam Constitutional:      General: He is not in acute distress.    Appearance: Normal appearance. He is not ill-appearing.  HENT:     Head: Normocephalic and atraumatic.     Right Ear: External ear normal.     Left Ear: External ear normal.     Mouth/Throat:     Mouth: Mucous membranes are moist.     Pharynx: Oropharynx is clear.  Eyes:     Extraocular Movements: Extraocular movements intact.     Conjunctiva/sclera: Conjunctivae normal.  Pupils: Pupils are equal, round, and reactive to light.  Neck:     Comments: No C-spine midline tenderness to palpation Cardiovascular:     Rate and Rhythm: Normal rate and regular rhythm.     Pulses: Normal pulses.     Heart sounds: Normal heart sounds.  Pulmonary:     Effort: Pulmonary effort is normal. No respiratory distress.     Breath sounds: Normal breath sounds.  Abdominal:     General: Abdomen is flat.     Palpations: Abdomen is soft.     Tenderness: There is no abdominal tenderness. There is no guarding.  Musculoskeletal:        General: No deformity.     Cervical back: No rigidity or tenderness.     Comments: No tenderness to palpation of midline thoracic or lumbar spine.  No step-offs palpated.  No tenderness to palpation of chest wall.  No bruising noted.  Tender to palpation of left clavicle and left shoulder.  Abrasion of left shoulder.  No  tenderness to palpation, bruising, or deformities noted of bilateral elbows, wrists, hips, knees, or ankles.  Neurological:     General: No focal deficit present.     Mental Status: He is alert and oriented to person, place, and time. Mental status is at baseline.     Cranial Nerves: No cranial nerve deficit.     Sensory: No sensory deficit.     Motor: Weakness (Chronic right upper extremity weakness from polio.  Full strength of left upper extremity and bilateral lower extremities) present.     ED Results / Procedures / Treatments   Labs (all labs ordered are listed, but only abnormal results are displayed) Labs Reviewed - No data to display  EKG None  Radiology DG Pelvis Portable  Result Date: 04/23/2023 CLINICAL DATA:  Fall.  Hip pain. EXAM: PORTABLE PELVIS 1-2 VIEWS COMPARISON:  None Available. FINDINGS: Pelvis is intact with normal and symmetric sacroiliac joints. No acute fracture or dislocation. No aggressive osseous lesion. Visualized sacral arcuate lines are unremarkable. Unremarkable symphysis pubis. There are mild degenerative changes of bilateral hip joints with mildly reduced joint space narrowing and osteophytosis of the superior acetabulum. No radiopaque foreign bodies. IMPRESSION: *No acute osseous abnormality of the pelvis. Electronically Signed   By: Jules Schick M.D.   On: 04/23/2023 13:49   CT Head Wo Contrast  Result Date: 04/23/2023 CLINICAL DATA:  head trauma fall; Neck trauma (Age >= 65y). Short LOC. Tripped on carpet. Hit head on wall. EXAM: CT HEAD WITHOUT CONTRAST CT CERVICAL SPINE WITHOUT CONTRAST TECHNIQUE: Multidetector CT imaging of the head and cervical spine was performed following the standard protocol without intravenous contrast. Multiplanar CT image reconstructions of the cervical spine were also generated. RADIATION DOSE REDUCTION: This exam was performed according to the departmental dose-optimization program which includes automated exposure  control, adjustment of the mA and/or kV according to patient size and/or use of iterative reconstruction technique. COMPARISON:  CT angiography head neck from 05/20/2021. FINDINGS: CT HEAD FINDINGS Brain: No evidence of acute infarction, hemorrhage, hydrocephalus, extra-axial collection or mass lesion/mass effect. There is bilateral periventricular hypodensity, which is non-specific but most likely seen in the settings of microvascular ischemic changes. Mild in extent. Otherwise normal appearance of brain parenchyma. Ventricles are normal. Cerebral volume is age appropriate. Vascular: No hyperdense vessel or unexpected calcification. Intracranial arteriosclerosis. Skull: Normal. Negative for fracture or focal lesion. Sinuses/Orbits: No acute finding. Other: Visualized mastoid air cells are unremarkable. No mastoid effusion. CT  CERVICAL SPINE FINDINGS Alignment: Normal. This examination does not assess for ligamentous injury or stability. Skull base and vertebrae: No acute fracture. No primary bone lesion or focal pathologic process. Soft tissues and spinal canal: No prevertebral fluid or swelling. No visible canal hematoma. Disc levels: Moderately reduced disc height at C6-7 level and mildly reduced disc height at C4-5 level. Remaining intervertebral disc heights are maintained. There are mild to moderate multilevel facet arthropathy and marginal osteophyte formation. Upper chest: There are minimal emphysematous changes in the visualized bilateral lung apices. Other: Left clavicular fracture is again seen. IMPRESSION: 1. No acute intracranial abnormality. 2. No acute osseous abnormality of the cervical spine. 3. Mild-to-moderate multilevel degenerative changes of the cervical spine. Electronically Signed   By: Jules Schick M.D.   On: 04/23/2023 13:47   CT Cervical Spine Wo Contrast  Result Date: 04/23/2023 CLINICAL DATA:  head trauma fall; Neck trauma (Age >= 65y). Short LOC. Tripped on carpet. Hit head on  wall. EXAM: CT HEAD WITHOUT CONTRAST CT CERVICAL SPINE WITHOUT CONTRAST TECHNIQUE: Multidetector CT imaging of the head and cervical spine was performed following the standard protocol without intravenous contrast. Multiplanar CT image reconstructions of the cervical spine were also generated. RADIATION DOSE REDUCTION: This exam was performed according to the departmental dose-optimization program which includes automated exposure control, adjustment of the mA and/or kV according to patient size and/or use of iterative reconstruction technique. COMPARISON:  CT angiography head neck from 05/20/2021. FINDINGS: CT HEAD FINDINGS Brain: No evidence of acute infarction, hemorrhage, hydrocephalus, extra-axial collection or mass lesion/mass effect. There is bilateral periventricular hypodensity, which is non-specific but most likely seen in the settings of microvascular ischemic changes. Mild in extent. Otherwise normal appearance of brain parenchyma. Ventricles are normal. Cerebral volume is age appropriate. Vascular: No hyperdense vessel or unexpected calcification. Intracranial arteriosclerosis. Skull: Normal. Negative for fracture or focal lesion. Sinuses/Orbits: No acute finding. Other: Visualized mastoid air cells are unremarkable. No mastoid effusion. CT CERVICAL SPINE FINDINGS Alignment: Normal. This examination does not assess for ligamentous injury or stability. Skull base and vertebrae: No acute fracture. No primary bone lesion or focal pathologic process. Soft tissues and spinal canal: No prevertebral fluid or swelling. No visible canal hematoma. Disc levels: Moderately reduced disc height at C6-7 level and mildly reduced disc height at C4-5 level. Remaining intervertebral disc heights are maintained. There are mild to moderate multilevel facet arthropathy and marginal osteophyte formation. Upper chest: There are minimal emphysematous changes in the visualized bilateral lung apices. Other: Left clavicular  fracture is again seen. IMPRESSION: 1. No acute intracranial abnormality. 2. No acute osseous abnormality of the cervical spine. 3. Mild-to-moderate multilevel degenerative changes of the cervical spine. Electronically Signed   By: Jules Schick M.D.   On: 04/23/2023 13:47   DG Chest Portable 1 View  Result Date: 04/23/2023 CLINICAL DATA:  L shoulder pain. EXAM: PORTABLE CHEST 1 VIEW COMPARISON:  05/29/2021. FINDINGS: Low lung volume. Patchy areas of atelectasis/scarring noted at the left lung base. Bilateral lung fields are otherwise clear. Bilateral costophrenic angles are clear. Normal cardio-mediastinal silhouette. Redemonstration of left clavicular fracture. No other acute osseous abnormalities. The soft tissues are within normal limits. IMPRESSION: *No acute cardiopulmonary abnormality. Electronically Signed   By: Jules Schick M.D.   On: 04/23/2023 13:39   DG Shoulder Left  Result Date: 04/23/2023 CLINICAL DATA:  Left shoulder pain, status post fall. EXAM: LEFT SHOULDER - 2+ VIEW COMPARISON:  None Available. FINDINGS: Limited exam due to projections. There is  mildly displaced fracture of the medial aspect of the left clavicle. No other acute fracture or dislocation. No aggressive osseous lesion. Glenohumeral and acromioclavicular joints are normal in alignment exhibit mild degenerative changes. No soft tissue swelling. No radiopaque foreign bodies. IMPRESSION: *Mildly displaced fracture of the medial aspect of the left clavicle. Electronically Signed   By: Jules Schick M.D.   On: 04/23/2023 13:37    Procedures .Ortho Injury Treatment  Date/Time: 04/23/2023 6:15 PM  Performed by: Rondel Baton, MD Authorized by: Rondel Baton, MD   Consent:    Consent obtained:  Verbal   Consent given by:  PatientInjury location: sternoclavicular Location details: left clavicle Injury type: fracture Pre-procedure neurovascular assessment: neurovascularly intact Immobilization:  sling Post-procedure neurovascular assessment: post-procedure neurovascularly intact       Medications Ordered in ED Medications  HYDROcodone-acetaminophen (NORCO/VICODIN) 5-325 MG per tablet 1 tablet (1 tablet Oral Given 04/23/23 1053)  bacitracin 500 UNIT/GM ointment (1 Application  Given 04/23/23 1256)  acetaminophen (TYLENOL) tablet 650 mg (650 mg Oral Given 04/23/23 1350)    ED Course/ Medical Decision Making/ A&P                                 Medical Decision Making Amount and/or Complexity of Data Reviewed Radiology: ordered.  Risk OTC drugs. Prescription drug management.   [name] is a 82 y.o. male with comorbidities that complicate the patient evaluation including polio with right upper extremity weakness and CAD on Plavix who presents to the emergency department after a fall with left shoulder pain  Initial Ddx:  TBI, C-spine injury, concussion, clavicle fracture, proximal humerus fracture, rib fracture, pelvic fracture  MDM/Course:  Patient presents emergency department after a fall with left shoulder pain.  There were some reports that he may have hit his head and he is on Plavix again a CT of the head and C-spine which did not reveal fracture.  Left shoulder x-ray did reveal a clavicular fracture and he was placed in a sling.  No pelvic or femur fractures on this pelvic x-ray.  No rib fractures on his chest x-ray.  Pain was able to be controlled in the emergency department and he was discharged home with a sling and Norco.  Will have him follow-up with orthopedics.   This patient presents to the ED for concern of complaints listed in HPI, this involves an extensive number of treatment options, and is a complaint that carries with it a high risk of complications and morbidity. Disposition including potential need for admission considered.   Dispo: DC Home. Return precautions discussed including, but not limited to, those listed in the AVS. Allowed pt time  to ask questions which were answered fully prior to dc.  Additional history obtained from spouse Records reviewed Outpatient Clinic Notes I independently reviewed the following imaging with scope of interpretation limited to determining acute life threatening conditions related to emergency care: Extremity x-ray(s) and agree with the radiologist interpretation with the following exceptions: none I personally reviewed and interpreted cardiac monitoring: normal sinus rhythm  I personally reviewed and interpreted the pt's EKG: see above for interpretation  I have reviewed the patients home medications and made adjustments as needed Social Determinants of health:  Elderly  Portions of this note were generated with Scientist, clinical (histocompatibility and immunogenetics). Dictation errors may occur despite best attempts at proofreading.           Final Clinical  Impression(s) / ED Diagnoses Final diagnoses:  Closed displaced fracture of left clavicle, unspecified part of clavicle, initial encounter  Fall, initial encounter    Rx / DC Orders ED Discharge Orders          Ordered    HYDROcodone-acetaminophen (NORCO/VICODIN) 5-325 MG tablet  Every 4 hours PRN        04/23/23 1421              Rondel Baton, MD 04/23/23 1816

## 2023-04-23 NOTE — ED Notes (Signed)
Patient transported to CT 

## 2023-05-02 DIAGNOSIS — J449 Chronic obstructive pulmonary disease, unspecified: Secondary | ICD-10-CM | POA: Diagnosis not present

## 2023-05-02 DIAGNOSIS — J9610 Chronic respiratory failure, unspecified whether with hypoxia or hypercapnia: Secondary | ICD-10-CM | POA: Diagnosis not present

## 2023-05-05 DIAGNOSIS — I714 Abdominal aortic aneurysm, without rupture, unspecified: Secondary | ICD-10-CM | POA: Diagnosis not present

## 2023-05-05 DIAGNOSIS — J9622 Acute and chronic respiratory failure with hypercapnia: Secondary | ICD-10-CM | POA: Diagnosis not present

## 2023-05-05 DIAGNOSIS — J9621 Acute and chronic respiratory failure with hypoxia: Secondary | ICD-10-CM | POA: Diagnosis not present

## 2023-05-16 DIAGNOSIS — D72829 Elevated white blood cell count, unspecified: Secondary | ICD-10-CM | POA: Diagnosis not present

## 2023-05-16 DIAGNOSIS — R911 Solitary pulmonary nodule: Secondary | ICD-10-CM | POA: Diagnosis not present

## 2023-05-16 DIAGNOSIS — I350 Nonrheumatic aortic (valve) stenosis: Secondary | ICD-10-CM | POA: Diagnosis not present

## 2023-05-16 DIAGNOSIS — I10 Essential (primary) hypertension: Secondary | ICD-10-CM | POA: Diagnosis not present

## 2023-05-16 DIAGNOSIS — E782 Mixed hyperlipidemia: Secondary | ICD-10-CM | POA: Diagnosis not present

## 2023-05-16 DIAGNOSIS — J9611 Chronic respiratory failure with hypoxia: Secondary | ICD-10-CM | POA: Diagnosis not present

## 2023-05-16 DIAGNOSIS — E1169 Type 2 diabetes mellitus with other specified complication: Secondary | ICD-10-CM | POA: Diagnosis not present

## 2023-05-16 DIAGNOSIS — I714 Abdominal aortic aneurysm, without rupture, unspecified: Secondary | ICD-10-CM | POA: Diagnosis not present

## 2023-05-16 DIAGNOSIS — K219 Gastro-esophageal reflux disease without esophagitis: Secondary | ICD-10-CM | POA: Diagnosis not present

## 2023-05-16 DIAGNOSIS — I2510 Atherosclerotic heart disease of native coronary artery without angina pectoris: Secondary | ICD-10-CM | POA: Diagnosis not present

## 2023-05-16 DIAGNOSIS — D509 Iron deficiency anemia, unspecified: Secondary | ICD-10-CM | POA: Diagnosis not present

## 2023-05-16 DIAGNOSIS — G14 Postpolio syndrome: Secondary | ICD-10-CM | POA: Diagnosis not present

## 2023-06-01 DIAGNOSIS — J9610 Chronic respiratory failure, unspecified whether with hypoxia or hypercapnia: Secondary | ICD-10-CM | POA: Diagnosis not present

## 2023-06-01 DIAGNOSIS — J449 Chronic obstructive pulmonary disease, unspecified: Secondary | ICD-10-CM | POA: Diagnosis not present

## 2023-06-04 DIAGNOSIS — J9621 Acute and chronic respiratory failure with hypoxia: Secondary | ICD-10-CM | POA: Diagnosis not present

## 2023-06-04 DIAGNOSIS — I714 Abdominal aortic aneurysm, without rupture, unspecified: Secondary | ICD-10-CM | POA: Diagnosis not present

## 2023-06-04 DIAGNOSIS — J9622 Acute and chronic respiratory failure with hypercapnia: Secondary | ICD-10-CM | POA: Diagnosis not present

## 2023-06-08 NOTE — Progress Notes (Unsigned)
Patient ID: [name], male   DOB: Nov 05, 1940, 82 y.o.   MRN: [id number]    82 y.o. history of distant LAD stent 2009. CRFls HTN and HLD. Activity limited by leg weakness from polio. Stays busy doing remodeling work. Sees hematiology for chronically elevated WBC no signs of lymphoma  Had EGD and colonoscopy 05/13/18 for heme positive stool and iron deficiency anemia Noted external Hemorrhoids and one small polyp removed Also had an area of gastric angiodysplasia Rx with clip/cautery  Myovue normal no ischemia 07/02/19 Echo with mild / moderate AS 06/25/19   Rx for bladder cancer. CT showed 6 cm AAA  Seen by Dr Edilia Bo with open repair 10/14/19   [facility] 05/27/21 Presented with nausea and vomiting with hypercapnic respiratory failure requiring intubation post cath Cath with patent LAD stents no obstructive CAD moderate AS TTE mean gradient 21 peak 39 and mean at cath 05/20/21 20 mmHg ? Neuromuscular post polio issues referred to [facility] d/c on 2L [place] and non invasive ventilation NIV  TTE 08/15/21 EF 60-65% mild LVH moderate AS mean gradient 15 peak 30.7 mmHg DVI 0.41 AVA 1.3 cm2 TTE 08/18/22 EF normal moderate AS mean gradient 25.3 peak 46.8 AVA 1.1 cm2 DVI 0.37  No cardiac complaints  Mechanical fall 04/23/23 C spine/Head CT normal Had left clavicle fracture and put in sling   ***   ROS: Denies fever, malais, weight loss, blurry vision, decreased visual acuity, cough, sputum, SOB, hemoptysis, pleuritic pain, palpitaitons, heartburn, abdominal pain, melena, lower extremity edema, claudication, or rash.  All other systems reviewed and negative  General: There were no vitals taken for this visit.  Affect appropriate Healthy:  appears stated age HEENT: normal Neck supple with no adenopathy JVP normal no bruits no thyromegaly Lungs clear with no wheezing and good diaphragmatic motion Heart:  S1/S2 AS murmur, no rub, gallop or click PMI normal Abdomen: benighn, BS positve, no  tenderness, no AAA no bruit.  No HSM or HJR Distal pulses intact with no bruits Plus one bilateral edema Neuro non-focal Skin warm and dry Polio with atrophied right shoulder and arm  Post left TKR    Current Outpatient Medications  Medication Sig Dispense Refill   aspirin EC 81 MG tablet Take 81 mg by mouth daily.     atorvastatin (LIPITOR) 40 MG tablet Take 1 tablet (40 mg total) by mouth daily. 90 tablet 3   clopidogrel (PLAVIX) 75 MG tablet TAKE 1 TABLET BY MOUTH ONCE DAILY . APPOINTMENT REQUIRED FOR FUTURE REFILLS 90 tablet 3   ELDERBERRY PO Take 1 tablet by mouth daily.      ferrous sulfate 325 (65 FE) MG tablet Take 1 tablet (325 mg total) by mouth 2 (two) times daily with a meal.  3   HYDROcodone-acetaminophen (NORCO/VICODIN) 5-325 MG tablet Take 1 tablet by mouth every 4 (four) hours as needed. 12 tablet 0   hydrOXYzine (ATARAX) 10 MG tablet hydroxyzine HCl 10 mg tablet  Take 1 tablet every 8 hours by oral route as needed.  for severe itching     levocetirizine (XYZAL) 5 MG tablet Take 5 mg by mouth daily as needed for allergies.     nitroGLYCERIN (NITROSTAT) 0.4 MG SL tablet Place 1 tablet (0.4 mg total) under the tongue every 5 (five) minutes x 3 doses as needed for chest pain. 25 tablet 3   ondansetron (ZOFRAN) 4 MG tablet Take 8 mg by mouth 2 (two) times daily.     pantoprazole (PROTONIX)  40 MG tablet Take 1 tablet (40 mg total) by mouth daily. Take 30 min before meals     Probiotic Product (PROBIOTIC PO) Take 1 tablet by mouth daily.     No current facility-administered medications for this visit.    Allergies  Tramadol  Electrocardiogram:   06/08/2023 SR rate 69  RBBB LAD no changes   Assessment and Plan  CAD: Distant LAD stent Cath 05/20/21 patent no osbstrucitve disease medical Rx   HTN: Well controlled.  Continue current medications and low sodium Dash type diet.    Chol:   LDL 75 most recent labs continue  lipitor 40 mg daily    Aortic Stenosis :  TTE  reviewed  08/18/22 gradients increased but still moderate AS f/u TTE March 2025   Edema:  Dependant -Low dose diuretic HCTZ 12.5 mg  PRN Low sodium diet .   Anemia:  See notes regarding Endo/Colon on 05/31/18  Continue iron and prilosec   AAA:  Post repair Dr Edilia Bo 10/14/19 f/u US per [facility] Normal ABI's 07/14/22  Pulmonary:  post polio hypercapnic respiratory failure with home oxygen and NIV F/U Dr Allena Katz  Encouraged him to keep new appointment  Ortho:  04/23/23 mechanical fall with left clavicular fracture F/U ortho   TTE March 2025  F/U in 6 months    Charlton Haws

## 2023-06-11 ENCOUNTER — Ambulatory Visit: Payer: Medicare HMO | Attending: Cardiovascular Disease | Admitting: Cardiovascular Disease

## 2023-06-11 ENCOUNTER — Encounter: Payer: Self-pay | Admitting: Cardiovascular Disease

## 2023-06-11 VITALS — BP 140/78 | HR 64 | Ht 66.0 in | Wt 202.8 lb

## 2023-06-11 DIAGNOSIS — I350 Nonrheumatic aortic (valve) stenosis: Secondary | ICD-10-CM

## 2023-06-11 NOTE — Patient Instructions (Signed)
Medication Instructions:  Your physician recommends that you continue on your current medications as directed. Please refer to the Current Medication list given to you today.  *If you need a refill on your cardiac medications before your next appointment, please call your pharmacy*   Lab Work: NONE   If you have labs (blood work) drawn today and your tests are completely normal, you will receive your results only by: MyChart Message (if you have MyChart) OR A paper copy in the mail If you have any lab test that is abnormal or we need to change your treatment, we will call you to review the results.   Testing/Procedures: Your physician has requested that you have an echocardiogram. Echocardiography is a painless test that uses sound waves to create images of your heart. It provides your doctor with information about the size and shape of your heart and how well your heart's chambers and valves are working. This procedure takes approximately one hour. There are no restrictions for this procedure. Please do NOT wear cologne, perfume, aftershave, or lotions (deodorant is allowed). Please arrive 15 minutes prior to your appointment time.  Please note: We ask at that you not bring children with you during ultrasound (echo/ vascular) testing. Due to room size and safety concerns, children are not allowed in the ultrasound rooms during exams. Our front office staff cannot provide observation of children in our lobby area while testing is being conducted. An adult accompanying a patient to their appointment will only be allowed in the ultrasound room at the discretion of the ultrasound technician under special circumstances. We apologize for any inconvenience.    Follow-Up: At [facility], you and your health needs are our priority.  As part of our continuing mission to provide you with exceptional heart care, we have created designated Provider Care Teams.  These Care Teams include your  primary Cardiologist (physician) and Advanced Practice Providers (APPs -  Physician Assistants and Nurse Practitioners) who all work together to provide you with the care you need, when you need it.  We recommend signing up for the patient portal called "MyChart".  Sign up information is provided on this After Visit Summary.  MyChart is used to connect with patients for Virtual Visits (Telemedicine).  Patients are able to view lab/test results, encounter notes, upcoming appointments, etc.  Non-urgent messages can be sent to your provider as well.   To learn more about what you can do with MyChart, go to [email or web address].    Your next appointment:   6 month(s)  Provider:   You may see Charlton Haws, MD or one of the following Advanced Practice Providers on your designated Care Team:   Randall An, PA-C  Jacolyn Reedy, PA-C     Other Instructions Thank you for choosing [facility]!

## 2023-07-02 DIAGNOSIS — J9610 Chronic respiratory failure, unspecified whether with hypoxia or hypercapnia: Secondary | ICD-10-CM | POA: Diagnosis not present

## 2023-07-02 DIAGNOSIS — J449 Chronic obstructive pulmonary disease, unspecified: Secondary | ICD-10-CM | POA: Diagnosis not present

## 2023-07-05 DIAGNOSIS — I714 Abdominal aortic aneurysm, without rupture, unspecified: Secondary | ICD-10-CM | POA: Diagnosis not present

## 2023-07-05 DIAGNOSIS — J9621 Acute and chronic respiratory failure with hypoxia: Secondary | ICD-10-CM | POA: Diagnosis not present

## 2023-07-05 DIAGNOSIS — J9622 Acute and chronic respiratory failure with hypercapnia: Secondary | ICD-10-CM | POA: Diagnosis not present

## 2023-08-02 DIAGNOSIS — J9610 Chronic respiratory failure, unspecified whether with hypoxia or hypercapnia: Secondary | ICD-10-CM | POA: Diagnosis not present

## 2023-08-02 DIAGNOSIS — J449 Chronic obstructive pulmonary disease, unspecified: Secondary | ICD-10-CM | POA: Diagnosis not present

## 2023-08-05 DIAGNOSIS — J9621 Acute and chronic respiratory failure with hypoxia: Secondary | ICD-10-CM | POA: Diagnosis not present

## 2023-08-05 DIAGNOSIS — I714 Abdominal aortic aneurysm, without rupture, unspecified: Secondary | ICD-10-CM | POA: Diagnosis not present

## 2023-08-05 DIAGNOSIS — J9622 Acute and chronic respiratory failure with hypercapnia: Secondary | ICD-10-CM | POA: Diagnosis not present

## 2023-08-06 DIAGNOSIS — C678 Malignant neoplasm of overlapping sites of bladder: Secondary | ICD-10-CM | POA: Diagnosis not present

## 2023-08-15 ENCOUNTER — Other Ambulatory Visit: Payer: Self-pay | Admitting: Cardiovascular Disease

## 2023-08-16 ENCOUNTER — Ambulatory Visit (HOSPITAL_COMMUNITY)
Admission: RE | Admit: 2023-08-16 | Discharge: 2023-08-16 | Disposition: A | Discharge status: Home / Self Care | Payer: Medicare HMO | Source: Ambulatory Visit | Attending: Cardiovascular Disease | Admitting: Cardiovascular Disease

## 2023-08-16 DIAGNOSIS — I350 Nonrheumatic aortic (valve) stenosis: Secondary | ICD-10-CM | POA: Diagnosis not present

## 2023-08-16 LAB — ECHOCARDIOGRAM COMPLETE
AR max vel: 1.04 cm2
AV Area VTI: 0.99 cm2
AV Area mean vel: 0.89 cm2
AV Mean grad: 30.3 mmHg
AV Peak grad: 51.6 mmHg
Ao pk vel: 3.59 m/s
Area-P 1/2: 2.8 cm2
S' Lateral: 1.9 cm

## 2023-08-16 NOTE — Progress Notes (Signed)
*  PRELIMINARY RESULTS* Echocardiogram 2D Echocardiogram has been performed.  [name] 08/16/2023, 11:52 AM

## 2023-08-30 DIAGNOSIS — J9610 Chronic respiratory failure, unspecified whether with hypoxia or hypercapnia: Secondary | ICD-10-CM | POA: Diagnosis not present

## 2023-08-30 DIAGNOSIS — J449 Chronic obstructive pulmonary disease, unspecified: Secondary | ICD-10-CM | POA: Diagnosis not present

## 2023-09-02 DIAGNOSIS — I714 Abdominal aortic aneurysm, without rupture, unspecified: Secondary | ICD-10-CM | POA: Diagnosis not present

## 2023-09-02 DIAGNOSIS — J9622 Acute and chronic respiratory failure with hypercapnia: Secondary | ICD-10-CM | POA: Diagnosis not present

## 2023-09-02 DIAGNOSIS — J9621 Acute and chronic respiratory failure with hypoxia: Secondary | ICD-10-CM | POA: Diagnosis not present

## 2023-09-06 DIAGNOSIS — E782 Mixed hyperlipidemia: Secondary | ICD-10-CM | POA: Diagnosis not present

## 2023-09-06 DIAGNOSIS — E1169 Type 2 diabetes mellitus with other specified complication: Secondary | ICD-10-CM | POA: Diagnosis not present

## 2023-09-07 ENCOUNTER — Other Ambulatory Visit: Payer: Self-pay | Admitting: Cardiovascular Disease

## 2023-09-07 LAB — LAB REPORT - SCANNED
A1c: 6.2
Albumin, Urine POC: 10.1
Albumin/Creatinine Ratio, Urine, POC: 18
Creatinine, POC: 55.2 mg/dL
EGFR: 95

## 2023-09-13 DIAGNOSIS — I10 Essential (primary) hypertension: Secondary | ICD-10-CM | POA: Diagnosis not present

## 2023-09-13 DIAGNOSIS — I350 Nonrheumatic aortic (valve) stenosis: Secondary | ICD-10-CM | POA: Diagnosis not present

## 2023-09-13 DIAGNOSIS — J9611 Chronic respiratory failure with hypoxia: Secondary | ICD-10-CM | POA: Diagnosis not present

## 2023-09-13 DIAGNOSIS — E782 Mixed hyperlipidemia: Secondary | ICD-10-CM | POA: Diagnosis not present

## 2023-09-13 DIAGNOSIS — I2510 Atherosclerotic heart disease of native coronary artery without angina pectoris: Secondary | ICD-10-CM | POA: Diagnosis not present

## 2023-09-13 DIAGNOSIS — I714 Abdominal aortic aneurysm, without rupture, unspecified: Secondary | ICD-10-CM | POA: Diagnosis not present

## 2023-09-13 DIAGNOSIS — E1169 Type 2 diabetes mellitus with other specified complication: Secondary | ICD-10-CM | POA: Diagnosis not present

## 2023-09-13 DIAGNOSIS — J309 Allergic rhinitis, unspecified: Secondary | ICD-10-CM | POA: Diagnosis not present

## 2023-09-13 DIAGNOSIS — D509 Iron deficiency anemia, unspecified: Secondary | ICD-10-CM | POA: Diagnosis not present

## 2023-09-13 DIAGNOSIS — D72829 Elevated white blood cell count, unspecified: Secondary | ICD-10-CM | POA: Diagnosis not present

## 2023-09-13 DIAGNOSIS — K219 Gastro-esophageal reflux disease without esophagitis: Secondary | ICD-10-CM | POA: Diagnosis not present

## 2023-09-13 DIAGNOSIS — G14 Postpolio syndrome: Secondary | ICD-10-CM | POA: Diagnosis not present

## 2023-09-19 ENCOUNTER — Encounter (INDEPENDENT_AMBULATORY_CARE_PROVIDER_SITE_OTHER): Payer: Self-pay | Admitting: *Deleted

## 2023-09-30 DIAGNOSIS — J449 Chronic obstructive pulmonary disease, unspecified: Secondary | ICD-10-CM | POA: Diagnosis not present

## 2023-09-30 DIAGNOSIS — J9610 Chronic respiratory failure, unspecified whether with hypoxia or hypercapnia: Secondary | ICD-10-CM | POA: Diagnosis not present

## 2023-10-03 DIAGNOSIS — I714 Abdominal aortic aneurysm, without rupture, unspecified: Secondary | ICD-10-CM | POA: Diagnosis not present

## 2023-10-03 DIAGNOSIS — J9622 Acute and chronic respiratory failure with hypercapnia: Secondary | ICD-10-CM | POA: Diagnosis not present

## 2023-10-03 DIAGNOSIS — J9621 Acute and chronic respiratory failure with hypoxia: Secondary | ICD-10-CM | POA: Diagnosis not present

## 2023-10-10 ENCOUNTER — Encounter (INDEPENDENT_AMBULATORY_CARE_PROVIDER_SITE_OTHER): Payer: Self-pay | Admitting: Gastroenterology

## 2023-10-10 ENCOUNTER — Ambulatory Visit (INDEPENDENT_AMBULATORY_CARE_PROVIDER_SITE_OTHER): Admitting: Gastroenterology

## 2023-10-10 ENCOUNTER — Telehealth (INDEPENDENT_AMBULATORY_CARE_PROVIDER_SITE_OTHER): Payer: Self-pay | Admitting: Gastroenterology

## 2023-10-10 VITALS — BP 128/62 | HR 77 | Temp 97.3°F | Ht 66.0 in | Wt 203.4 lb

## 2023-10-10 DIAGNOSIS — D500 Iron deficiency anemia secondary to blood loss (chronic): Secondary | ICD-10-CM | POA: Diagnosis not present

## 2023-10-10 DIAGNOSIS — D509 Iron deficiency anemia, unspecified: Secondary | ICD-10-CM

## 2023-10-10 DIAGNOSIS — Z8719 Personal history of other diseases of the digestive system: Secondary | ICD-10-CM

## 2023-10-10 DIAGNOSIS — K31819 Angiodysplasia of stomach and duodenum without bleeding: Secondary | ICD-10-CM | POA: Insufficient documentation

## 2023-10-10 NOTE — Telephone Encounter (Signed)
    10/10/23  Sammie Crigler A Loden Jun 18, 1940  What type of surgery is being performed? Push Enteroscopy  When is surgery scheduled? TBD  What type of clearance is required (medical or pharmacy to hold medication or both? Pharmacy to hold medication  Are there any medications that need to be held prior to surgery and how long? Clearance to hold Plavis x5 days prior  Name of physician performing surgery?  Dr. Lorie Rook [facility] Phone: [phone] Fax: [phone]  Anethesia type (none, local, MAC, general)? Choice

## 2023-10-10 NOTE — Progress Notes (Signed)
 [name] , M.D. Gastroenterology & Hepatology [facility] Gastroenterology [address] [place], [place] [address] Primary Care Physician: Omie Bickers, MD [address] [place] [address]  Chief Complaint:  Iron deficiency anemia   History of Present Illness: [name] is a 83 y.o. male with hyperlipidemia, hypertension, AAA, coronary artery disease status post remote LAD stent placement, bladder cancer status post TURBT, IDA, chronic leukocytosis, history of polio with residual right arm atrophy,  who presents for evaluation of Iron deficiency anemia   Patient was last by Dr.Rehman for IDA, December 2019 and EGD with large gastric AV malformation with stigmata of bleeding treated with APC and clip.  Patient had recent routine lab work demonstrating drop in hemoglobin from 13 to 10.The patient denies having any nausea, vomiting, fever, chills, hematochezia, melena, hematemesis, abdominal distention, abdominal pain, diarrhea, jaundice, pruritus or weight loss. Patient reports that he started taking iron tablets orally but is unable to tolerate them because it is " messing up" his stomach and also causing diarrhea rather.  Denies taking any NSAIDs  Last MWU:1324  - Normal esophagus. - Z- line regular, 39 cm from the incisors. - 2 cm hiatal hernia. - Normal stomach. - Normal duodenal bulb. - A single non- bleeding angiodysplastic lesion in the duodenum. - No specimens collected.  2019 - Normal esophagus. - Z- line regular, 41 cm from the incisors. - A single bleeding angiodysplastic lesion in the stomach. . Treated with a heater probe but kept bleeding. Clips ( MR conditional) were placed. Treated with argon plasma coagulation ( APC) . - Normal duodenal bulb and second portion of the duodenum. - No specimens collected.  Last labs from 0 08/12/2023 downtrending hemoglobin 10.1 MCV 81 normal liver enzymes A1c 6.2 Last Colonoscopy:2019  -  Preparation of the colon was fair. - One small polyp in the distal sigmoid colon, removed with a cold snare. Resected and retrieved. - External hemorrhoids.  Past Medical History: Past Medical History:  Diagnosis Date   AAA (abdominal aortic aneurysm) (HCC)    Bladder cancer (HCC)    s/p TURBT 09/26/19   Blood transfusion without reported diagnosis    Coronary artery disease    stents placed   GERD (gastroesophageal reflux disease)    HOH (hard of hearing)    Hyperlipidemia    Hypertension    Iron deficiency anemia 05/14/2018   Polio    hx of. right arm atrophy    Past Surgical History: Past Surgical History:  Procedure Laterality Date   ABDOMINAL AORTIC ANEURYSM REPAIR N/A 10/14/2019   Procedure: OPEN ABDOMINAL AORTIC ANEURYSM REPAIR USING HEMASHIELD GOLD 16mm x 30cm GRAFT;  Surgeon: Dannis Dy, MD;  Location: [facility] OR;  Service: Vascular;  Laterality: N/A;   CARDIAC CATHETERIZATION  07-09-2007   CHOLECYSTECTOMY     COLONOSCOPY WITH PROPOFOL  N/A 05/31/2018   Procedure: COLONOSCOPY WITH PROPOFOL ;  Surgeon: Ruby Corporal, MD;  [facility];  Service: Endoscopy;  Laterality: N/A;  1:45   CORONARY STENT PLACEMENT     x3 vessels bifurcated single vessel   CYSTOSCOPY W/ RETROGRADES Bilateral 09/26/2019   Procedure: CYSTOSCOPY WITH RETROGRADE PYELOGRAM;  Surgeon: Osborn Blaze, MD;  Location: [facility] ORS;  Service: Urology;  Laterality: Bilateral;   ESOPHAGOGASTRODUODENOSCOPY (EGD) WITH PROPOFOL  N/A 05/31/2018   Procedure: ESOPHAGOGASTRODUODENOSCOPY (EGD) WITH PROPOFOL ;  Surgeon: Ruby Corporal, MD;  [facility]: [facility];  Service: Endoscopy;  Laterality: N/A;   ESOPHAGOGASTRODUODENOSCOPY (EGD)  WITH PROPOFOL  N/A 08/08/2019   Procedure: ESOPHAGOGASTRODUODENOSCOPY (EGD) WITH PROPOFOL ;  Surgeon: Ruby Corporal, MD;  [facility]: [facility];  Service: Endoscopy;  Laterality: N/A;   KNEE SURGERY     Left knee  steel rod   LEFT HEART CATH AND CORONARY ANGIOGRAPHY N/A  05/20/2021   Procedure: LEFT HEART CATH AND CORONARY ANGIOGRAPHY;  Surgeon: Arleen Lacer, MD;  Location: [facility] INVASIVE CV LAB;  Service: Cardiovascular;  Laterality: N/A;   POLYPECTOMY  05/31/2018   Procedure: POLYPECTOMY;  Surgeon: Ruby Corporal, MD;  [facility]: [facility];  Service: Endoscopy;;  sigmoid   TRANSURETHRAL RESECTION OF BLADDER TUMOR N/A 09/26/2019   Procedure: TRANSURETHRAL RESECTION OF BLADDER TUMOR (TURBT);  Surgeon: Osborn Blaze, MD;  Location: [facility] ORS;  Service: Urology;  Laterality: N/A;  1 HR    Family History: Family History  Problem Relation Age of Onset   Heart attack Brother 75       died   Hypertension Mother    Heart attack Father     Social History: Social History   Tobacco Use  Smoking Status Former   Current packs/day: 0.00   Average packs/day: 1 pack/day for 15.0 years (15.0 ttl pk-yrs)   Types: Cigarettes   Start date: 06/12/1989   Quit date: 06/12/2004   Years since quitting: 19.3  Smokeless Tobacco Never   Social History   Substance and Sexual Activity  Alcohol Use No   Alcohol/week: 0.0 standard drinks of alcohol   Social History   Substance and Sexual Activity  Drug Use No    Allergies: Allergies  Allergen Reactions   Tramadol  Other (See Comments)    Excessive sleepiness/difficulty waking patient    Medications: Current Outpatient Medications  Medication Sig Dispense Refill   aspirin  EC 81 MG tablet Take 81 mg by mouth daily.     atorvastatin  (LIPITOR) 40 MG tablet Take 1 tablet by mouth once daily 90 tablet 2   clopidogrel  (PLAVIX ) 75 MG tablet Take 1 tablet (75 mg total) by mouth daily. 90 tablet 0   ELDERBERRY PO Take 1 tablet by mouth daily.      HYDROcodone -acetaminophen  (NORCO/VICODIN) 5-325 MG tablet Take 1 tablet by mouth every 4 (four) hours as needed. 12 tablet 0   hydrOXYzine (ATARAX) 10 MG tablet hydroxyzine HCl 10 mg tablet  Take 1 tablet every 8 hours by oral route as needed.  for severe itching      levocetirizine (XYZAL) 5 MG tablet Take 5 mg by mouth daily as needed for allergies.     nitroGLYCERIN  (NITROSTAT ) 0.4 MG SL tablet Place 1 tablet (0.4 mg total) under the tongue every 5 (five) minutes x 3 doses as needed for chest pain. 25 tablet 3   pantoprazole  (PROTONIX ) 40 MG tablet Take 1 tablet (40 mg total) by mouth daily. Take 30 min before meals     ferrous sulfate  325 (65 FE) MG tablet Take 1 tablet (325 mg total) by mouth 2 (two) times daily with a meal. (Patient not taking: Reported on 10/10/2023)  3   No current facility-administered medications for this visit.    Review of Systems: GENERAL: negative for malaise, night sweats HEENT: No changes in hearing or vision, no nose bleeds or other nasal problems. NECK: Negative for lumps, goiter, pain and significant neck swelling RESPIRATORY: Negative for cough, wheezing CARDIOVASCULAR: Negative for chest pain, leg swelling, palpitations, orthopnea GI: SEE HPI MUSCULOSKELETAL: Negative for joint pain or swelling, back pain, and muscle pain. SKIN: Negative  for lesions, rash HEMATOLOGY Negative for prolonged bleeding, bruising easily, and swollen nodes. ENDOCRINE: Negative for cold or heat intolerance, polyuria, polydipsia and goiter. NEURO: negative for tremor, gait imbalance, syncope and seizures. The remainder of the review of systems is noncontributory.   Physical Exam: BP 128/62   Pulse 77   Temp (!) 97.3 F (36.3 C)   Ht 5\' 6"  (1.676 m)   Wt 203 lb 6.4 oz (92.3 kg)   BMI 32.83 kg/m  GENERAL: The patient is AO x3, in no acute distress. HEENT: Head is normocephalic and atraumatic. EOMI are intact. Mouth is well hydrated and without lesions. NECK: Supple. No masses LUNGS: Clear to auscultation. No presence of rhonchi/wheezing/rales. Adequate chest expansion HEART: RRR, normal s1 and s2. ABDOMEN: Soft, nontender, no guarding, no peritoneal signs, and nondistended. BS +. No masses.  Imaging/Labs: as above     Latest Ref  Rng & Units 06/15/2021    2:14 PM 05/30/2021    4:40 AM 05/29/2021   10:18 AM  CBC  WBC 4.0 - 10.5 K/uL 13.0  11.1  9.5   Hemoglobin 13.0 - 17.0 g/dL 69.6  8.8  9.6   Hematocrit 39.0 - 52.0 % 36.7  33.6  37.9   Platelets 150 - 400 K/uL 475  443  439    Lab Results  Component Value Date   IRON 10 (L) 08/07/2019   TIBC 585 (H) 08/07/2019   FERRITIN 3 (L) 08/07/2019    I personally reviewed and interpreted the available labs, imaging and endoscopic files.  Impression and Plan:  YERAY BOLDON is a 83 y.o. male with hyperlipidemia, hypertension, AAA, coronary artery disease status post remote LAD stent placement, bladder cancer status post TURBT, IDA, chronic leukocytosis, history of polio with residual right arm atrophy,  who presents for evaluation of Iron deficiency anemia   #Iron deficiency anemia   Likely cause of recurrent iron deficiency anemia is GI tract AV malformation (angiectasia) most likely in the upper GI tract.  No overt bleeding at this time  2019 patient had a EGD with large gastric AVM malformation status post treatment which was followed up with colonoscopy 2018 (fair prep) and upper endoscopy in 2021 there abnormal bleeding due to AVM was also seen  Patient did had drop in hemoglobin from 13 to 10.  Is unable to tolerate oral iron supplementation.  We discussed the workup of iron deficiency anemia with bidirectional endoscopy and  possible capsule endoscopy   After discussing risks limitation of upper endoscopy and colonoscopy patient opted to proceed with upper endoscopy with push enteroscopy as most likely lesion is in the upper GI tract as seen on EGD in 2019 and 2021  If upper endoscopy reports endoscopy is not diagnostic we will proceed with capsule endoscopy and colonoscopy after pending results of iron supplementation  Will repeat iron profile and possible referral to hematology for supplementation  All questions were answered.      Ancelmo Hunt Faizan  Ronnesha Mester, MD Gastroenterology and Hepatology [facility] Gastroenterology   This chart has been completed using [facility] Dictation software, and while attempts have been made to ensure accuracy , certain words and phrases may not be transcribed as intended

## 2023-10-10 NOTE — Patient Instructions (Signed)
 It was very nice to meet you today, as dicussed with will plan for the following :  1) Push enteroscopy 2) Take iron every other day

## 2023-10-10 NOTE — H&P (View-Only) (Signed)
 [name] , M.D. Gastroenterology & Hepatology [facility] Gastroenterology [address] [place], [place] [address] Primary Care Physician: Omie Bickers, MD [address] [place] [address]  Chief Complaint:  Iron deficiency anemia   History of Present Illness: [name] is a 83 y.o. male with hyperlipidemia, hypertension, AAA, coronary artery disease status post remote LAD stent placement, bladder cancer status post TURBT, IDA, chronic leukocytosis, history of polio with residual right arm atrophy,  who presents for evaluation of Iron deficiency anemia   Patient was last by Dr.Rehman for IDA, December 2019 and EGD with large gastric AV malformation with stigmata of bleeding treated with APC and clip.  Patient had recent routine lab work demonstrating drop in hemoglobin from 13 to 10.The patient denies having any nausea, vomiting, fever, chills, hematochezia, melena, hematemesis, abdominal distention, abdominal pain, diarrhea, jaundice, pruritus or weight loss. Patient reports that he started taking iron tablets orally but is unable to tolerate them because it is " messing up" his stomach and also causing diarrhea rather.  Denies taking any NSAIDs  Last MWU:1324  - Normal esophagus. - Z- line regular, 39 cm from the incisors. - 2 cm hiatal hernia. - Normal stomach. - Normal duodenal bulb. - A single non- bleeding angiodysplastic lesion in the duodenum. - No specimens collected.  2019 - Normal esophagus. - Z- line regular, 41 cm from the incisors. - A single bleeding angiodysplastic lesion in the stomach. . Treated with a heater probe but kept bleeding. Clips ( MR conditional) were placed. Treated with argon plasma coagulation ( APC) . - Normal duodenal bulb and second portion of the duodenum. - No specimens collected.  Last labs from 0 08/12/2023 downtrending hemoglobin 10.1 MCV 81 normal liver enzymes A1c 6.2 Last Colonoscopy:2019  -  Preparation of the colon was fair. - One small polyp in the distal sigmoid colon, removed with a cold snare. Resected and retrieved. - External hemorrhoids.  Past Medical History: Past Medical History:  Diagnosis Date   AAA (abdominal aortic aneurysm) (HCC)    Bladder cancer (HCC)    s/p TURBT 09/26/19   Blood transfusion without reported diagnosis    Coronary artery disease    stents placed   GERD (gastroesophageal reflux disease)    HOH (hard of hearing)    Hyperlipidemia    Hypertension    Iron deficiency anemia 05/14/2018   Polio    hx of. right arm atrophy    Past Surgical History: Past Surgical History:  Procedure Laterality Date   ABDOMINAL AORTIC ANEURYSM REPAIR N/A 10/14/2019   Procedure: OPEN ABDOMINAL AORTIC ANEURYSM REPAIR USING HEMASHIELD GOLD 16mm x 30cm GRAFT;  Surgeon: Dannis Dy, MD;  Location: [facility] OR;  Service: Vascular;  Laterality: N/A;   CARDIAC CATHETERIZATION  07-09-2007   CHOLECYSTECTOMY     COLONOSCOPY WITH PROPOFOL  N/A 05/31/2018   Procedure: COLONOSCOPY WITH PROPOFOL ;  Surgeon: Ruby Corporal, MD;  [facility];  Service: Endoscopy;  Laterality: N/A;  1:45   CORONARY STENT PLACEMENT     x3 vessels bifurcated single vessel   CYSTOSCOPY W/ RETROGRADES Bilateral 09/26/2019   Procedure: CYSTOSCOPY WITH RETROGRADE PYELOGRAM;  Surgeon: Osborn Blaze, MD;  Location: [facility];  Service: Urology;  Laterality: Bilateral;   ESOPHAGOGASTRODUODENOSCOPY (EGD) WITH PROPOFOL  N/A 05/31/2018   Procedure: ESOPHAGOGASTRODUODENOSCOPY (EGD) WITH PROPOFOL ;  Surgeon: Ruby Corporal, MD;  [facility];  Service: Endoscopy;  Laterality: N/A;   ESOPHAGOGASTRODUODENOSCOPY (EGD)  WITH PROPOFOL  N/A 08/08/2019   Procedure: ESOPHAGOGASTRODUODENOSCOPY (EGD) WITH PROPOFOL ;  Surgeon: Ruby Corporal, MD;  [facility];  Service: Endoscopy;  Laterality: N/A;   KNEE SURGERY     Left knee  steel rod   LEFT HEART CATH AND CORONARY ANGIOGRAPHY N/A  05/20/2021   Procedure: LEFT HEART CATH AND CORONARY ANGIOGRAPHY;  Surgeon: Arleen Lacer, MD;  Location: [facility] INVASIVE CV LAB;  Service: Cardiovascular;  Laterality: N/A;   POLYPECTOMY  05/31/2018   Procedure: POLYPECTOMY;  Surgeon: Ruby Corporal, MD;  [facility];  Service: Endoscopy;;  sigmoid   TRANSURETHRAL RESECTION OF BLADDER TUMOR N/A 09/26/2019   Procedure: TRANSURETHRAL RESECTION OF BLADDER TUMOR (TURBT);  Surgeon: Osborn Blaze, MD;  Location: [facility];  Service: Urology;  Laterality: N/A;  1 HR    Family History: Family History  Problem Relation Age of Onset   Heart attack Brother 75       died   Hypertension Mother    Heart attack Father     Social History: Social History   Tobacco Use  Smoking Status Former   Current packs/day: 0.00   Average packs/day: 1 pack/day for 15.0 years (15.0 ttl pk-yrs)   Types: Cigarettes   Start date: 06/12/1989   Quit date: 06/12/2004   Years since quitting: 19.3  Smokeless Tobacco Never   Social History   Substance and Sexual Activity  Alcohol Use No   Alcohol/week: 0.0 standard drinks of alcohol   Social History   Substance and Sexual Activity  Drug Use No    Allergies: Allergies  Allergen Reactions   Tramadol  Other (See Comments)    Excessive sleepiness/difficulty waking patient    Medications: Current Outpatient Medications  Medication Sig Dispense Refill   aspirin  EC 81 MG tablet Take 81 mg by mouth daily.     atorvastatin  (LIPITOR) 40 MG tablet Take 1 tablet by mouth once daily 90 tablet 2   clopidogrel  (PLAVIX ) 75 MG tablet Take 1 tablet (75 mg total) by mouth daily. 90 tablet 0   ELDERBERRY PO Take 1 tablet by mouth daily.      HYDROcodone -acetaminophen  (NORCO/VICODIN) 5-325 MG tablet Take 1 tablet by mouth every 4 (four) hours as needed. 12 tablet 0   hydrOXYzine (ATARAX) 10 MG tablet hydroxyzine HCl 10 mg tablet  Take 1 tablet every 8 hours by oral route as needed.  for severe itching      levocetirizine (XYZAL) 5 MG tablet Take 5 mg by mouth daily as needed for allergies.     nitroGLYCERIN  (NITROSTAT ) 0.4 MG SL tablet Place 1 tablet (0.4 mg total) under the tongue every 5 (five) minutes x 3 doses as needed for chest pain. 25 tablet 3   pantoprazole  (PROTONIX ) 40 MG tablet Take 1 tablet (40 mg total) by mouth daily. Take 30 min before meals     ferrous sulfate  325 (65 FE) MG tablet Take 1 tablet (325 mg total) by mouth 2 (two) times daily with a meal. (Patient not taking: Reported on 10/10/2023)  3   No current facility-administered medications for this visit.    Review of Systems: GENERAL: negative for malaise, night sweats HEENT: No changes in hearing or vision, no nose bleeds or other nasal problems. NECK: Negative for lumps, goiter, pain and significant neck swelling RESPIRATORY: Negative for cough, wheezing CARDIOVASCULAR: Negative for chest pain, leg swelling, palpitations, orthopnea GI: SEE HPI MUSCULOSKELETAL: Negative for joint pain or swelling, back pain, and muscle pain. SKIN: Negative  for lesions, rash HEMATOLOGY Negative for prolonged bleeding, bruising easily, and swollen nodes. ENDOCRINE: Negative for cold or heat intolerance, polyuria, polydipsia and goiter. NEURO: negative for tremor, gait imbalance, syncope and seizures. The remainder of the review of systems is noncontributory.   Physical Exam: BP 128/62   Pulse 77   Temp (!) 97.3 F (36.3 C)   Ht 5\' 6"  (1.676 m)   Wt 203 lb 6.4 oz (92.3 kg)   BMI 32.83 kg/m  GENERAL: The patient is AO x3, in no acute distress. HEENT: Head is normocephalic and atraumatic. EOMI are intact. Mouth is well hydrated and without lesions. NECK: Supple. No masses LUNGS: Clear to auscultation. No presence of rhonchi/wheezing/rales. Adequate chest expansion HEART: RRR, normal s1 and s2. ABDOMEN: Soft, nontender, no guarding, no peritoneal signs, and nondistended. BS +. No masses.  Imaging/Labs: as above     Latest Ref  Rng & Units 06/15/2021    2:14 PM 05/30/2021    4:40 AM 05/29/2021   10:18 AM  CBC  WBC 4.0 - 10.5 K/uL 13.0  11.1  9.5   Hemoglobin 13.0 - 17.0 g/dL 69.6  8.8  9.6   Hematocrit 39.0 - 52.0 % 36.7  33.6  37.9   Platelets 150 - 400 K/uL 475  443  439    Lab Results  Component Value Date   IRON 10 (L) 08/07/2019   TIBC 585 (H) 08/07/2019   FERRITIN 3 (L) 08/07/2019    I personally reviewed and interpreted the available labs, imaging and endoscopic files.  Impression and Plan:  YERAY BOLDON is a 83 y.o. male with hyperlipidemia, hypertension, AAA, coronary artery disease status post remote LAD stent placement, bladder cancer status post TURBT, IDA, chronic leukocytosis, history of polio with residual right arm atrophy,  who presents for evaluation of Iron deficiency anemia   #Iron deficiency anemia   Likely cause of recurrent iron deficiency anemia is GI tract AV malformation (angiectasia) most likely in the upper GI tract.  No overt bleeding at this time  2019 patient had a EGD with large gastric AVM malformation status post treatment which was followed up with colonoscopy 2018 (fair prep) and upper endoscopy in 2021 there abnormal bleeding due to AVM was also seen  Patient did had drop in hemoglobin from 13 to 10.  Is unable to tolerate oral iron supplementation.  We discussed the workup of iron deficiency anemia with bidirectional endoscopy and  possible capsule endoscopy   After discussing risks limitation of upper endoscopy and colonoscopy patient opted to proceed with upper endoscopy with push enteroscopy as most likely lesion is in the upper GI tract as seen on EGD in 2019 and 2021  If upper endoscopy reports endoscopy is not diagnostic we will proceed with capsule endoscopy and colonoscopy after pending results of iron supplementation  Will repeat iron profile and possible referral to hematology for supplementation  All questions were answered.      Ancelmo Hunt Faizan  Ronnesha Mester, MD Gastroenterology and Hepatology [facility] Gastroenterology   This chart has been completed using [facility] Dictation software, and while attempts have been made to ensure accuracy , certain words and phrases may not be transcribed as intended

## 2023-10-10 NOTE — Telephone Encounter (Signed)
 Helping in preop today. Pharmacy clearance requested for push enteroscopy to hold Plavix . Per chart review patient had LAD stent back in 2006, but also AAA repair in 2021. After Dr. Fae Homans retirement he advised patient could follow-up PRN so they are no longer following. I will route to Dr. Stann Earnest to find out if he is OK to hold Plavix  from both CAD + PAD history. Very complex PMH per 12/204 OV. Dr. Stann Earnest - Please route response to P CV DIV PREOP (the pre-op pool). Thank you.

## 2023-10-11 LAB — IRON,TIBC AND FERRITIN PANEL
Ferritin: 10 ng/mL — ABNORMAL LOW (ref 30–400)
Iron Saturation: 3 % — CL (ref 15–55)
Iron: 13 ug/dL — ABNORMAL LOW (ref 38–169)
Total Iron Binding Capacity: 430 ug/dL (ref 250–450)
UIBC: 417 ug/dL — ABNORMAL HIGH (ref 111–343)

## 2023-10-11 NOTE — Telephone Encounter (Signed)
   Patient Name: [name]  DOB: [birth year] MRN: [id number]  Primary Cardiologist: Janelle Mediate, MD  Chart reviewed as part of pre-operative protocol coverage.  Pharmacy clearance request received with request to hold Plavix  for 5 days prior to push enteroscopy.    Reviewed with Dr. Nishan, "Patient can hold Plavix  for five days prior to procedure."  Please resume Plavix  as soon as safe to do so from a bleeding standpoint.  I will route this recommendation to the requesting party via Epic fax function and remove from pre-op pool.  Please call with questions.  Rosalind Guido D Arasely Akkerman, NP 10/11/2023, 10:28 AM

## 2023-10-15 DIAGNOSIS — G14 Postpolio syndrome: Secondary | ICD-10-CM | POA: Diagnosis not present

## 2023-10-15 DIAGNOSIS — J9611 Chronic respiratory failure with hypoxia: Secondary | ICD-10-CM | POA: Diagnosis not present

## 2023-10-15 DIAGNOSIS — D509 Iron deficiency anemia, unspecified: Secondary | ICD-10-CM | POA: Diagnosis not present

## 2023-10-15 DIAGNOSIS — I2510 Atherosclerotic heart disease of native coronary artery without angina pectoris: Secondary | ICD-10-CM | POA: Diagnosis not present

## 2023-10-15 DIAGNOSIS — Z6834 Body mass index (BMI) 34.0-34.9, adult: Secondary | ICD-10-CM | POA: Diagnosis not present

## 2023-10-15 DIAGNOSIS — K219 Gastro-esophageal reflux disease without esophagitis: Secondary | ICD-10-CM | POA: Diagnosis not present

## 2023-10-15 DIAGNOSIS — Z713 Dietary counseling and surveillance: Secondary | ICD-10-CM | POA: Diagnosis not present

## 2023-10-15 DIAGNOSIS — Z79899 Other long term (current) drug therapy: Secondary | ICD-10-CM | POA: Diagnosis not present

## 2023-10-15 DIAGNOSIS — I10 Essential (primary) hypertension: Secondary | ICD-10-CM | POA: Diagnosis not present

## 2023-10-15 DIAGNOSIS — I350 Nonrheumatic aortic (valve) stenosis: Secondary | ICD-10-CM | POA: Diagnosis not present

## 2023-10-15 DIAGNOSIS — Z7982 Long term (current) use of aspirin: Secondary | ICD-10-CM | POA: Diagnosis not present

## 2023-10-18 ENCOUNTER — Inpatient Hospital Stay

## 2023-10-18 ENCOUNTER — Inpatient Hospital Stay: Attending: Oncology | Admitting: Oncology

## 2023-10-18 VITALS — BP 146/69 | HR 66 | Temp 97.8°F | Resp 17 | Ht 66.54 in | Wt 203.0 lb

## 2023-10-18 DIAGNOSIS — D500 Iron deficiency anemia secondary to blood loss (chronic): Secondary | ICD-10-CM | POA: Insufficient documentation

## 2023-10-18 DIAGNOSIS — D509 Iron deficiency anemia, unspecified: Secondary | ICD-10-CM

## 2023-10-18 DIAGNOSIS — K31819 Angiodysplasia of stomach and duodenum without bleeding: Secondary | ICD-10-CM | POA: Diagnosis not present

## 2023-10-18 NOTE — Assessment & Plan Note (Addendum)
 The most likely cause of his anemia is due to chronic blood loss-angiodysplastic lesion in stomach s/p APC in 2019  Lab Results  Component Value Date   IRON 13 (L) 10/10/2023   TIBC 430 10/10/2023   FERRITIN 10 (L) 10/10/2023    TSAT: 3  Last colonoscopy/endoscopy: Endoscopy: 2019, 2021, colonoscopy: 2019   -We discussed some of the risks, benefits, and alternatives of intravenous iron infusions. The patient is symptomatic from anemia and the iron level is critically low. He tolerated oral iron supplement poorly and desires to achieve higher levels of iron faster for adequate hematopoesis. Some of the side-effects to be expected including risks of infusion reactions, phlebitis, headaches, nausea and fatigue.  The patient is willing to proceed. Patient education material was dispensed. Goal is to keep ferritin level greater than 50 and resolution of anemia -Continue oral iron but decrease it to every other day.  Use MiraLAX  for constipation -Continue to follow-up with Dr. Alita Irwin for endoscopy and colonoscopy  Return to clinic in 6 weeks with labs to assess response to IV iron

## 2023-10-18 NOTE — Progress Notes (Signed)
 [facility] at [facility]  HEMATOLOGY NEW VISIT  Benjamin Bickers, MD  REASON FOR REFERRAL: Iron deficiency anemia  HISTORY OF PRESENT ILLNESS: [name] 83 y.o. male referred for iron deficiency anemia. The patient is accompanied by his wife today.  Patient had an angiodysplastic lesion of stomach in 2019 which was fixed with APC.  He continues to be anemic and was recently seen by Dr. Alita Irwin and is planned to get endoscopy soon.  He experiences fatigue and occasional shortness of breath upon exertion, consistent with iron deficiency anemia. No recent weight loss or changes in appetite. No visible blood in stools or black stools.  He is currently taking oral iron supplements but discontinued them due to constipation. He has not been taking the iron pills consistently because of the side effects.  He has no family history of colon cancer  I have reviewed the past medical history, past surgical history, social history and family history with the patient   ALLERGIES:  is allergic to tramadol .  MEDICATIONS:  Current Outpatient Medications  Medication Sig Dispense Refill   aspirin  EC 81 MG tablet Take 81 mg by mouth daily.     atorvastatin  (LIPITOR) 40 MG tablet Take 1 tablet by mouth once daily 90 tablet 2   clopidogrel  (PLAVIX ) 75 MG tablet Take 1 tablet (75 mg total) by mouth daily. 90 tablet 0   ELDERBERRY PO Take 1 tablet by mouth daily.      ferrous sulfate  325 (65 FE) MG tablet Take 1 tablet (325 mg total) by mouth 2 (two) times daily with a meal.  3   HYDROcodone -acetaminophen  (NORCO/VICODIN) 5-325 MG tablet Take 1 tablet by mouth every 4 (four) hours as needed. 12 tablet 0   hydrOXYzine (ATARAX) 10 MG tablet hydroxyzine HCl 10 mg tablet  Take 1 tablet every 8 hours by oral route as needed.  for severe itching     Iron-FA-B Cmp-C-Biot-Probiotic (FUSION PLUS) CAPS Take 1 capsule by mouth daily.     levocetirizine (XYZAL) 5 MG tablet Take 5 mg by mouth  daily as needed for allergies.     nitroGLYCERIN  (NITROSTAT ) 0.4 MG SL tablet Place 1 tablet (0.4 mg total) under the tongue every 5 (five) minutes x 3 doses as needed for chest pain. 25 tablet 3   pantoprazole  (PROTONIX ) 40 MG tablet Take 1 tablet (40 mg total) by mouth daily. Take 30 min before meals     No current facility-administered medications for this visit.     REVIEW OF SYSTEMS:   Constitutional: Denies fevers, chills or night sweats Eyes: Denies blurriness of vision Ears, nose, mouth, throat, and face: Denies mucositis or sore throat Respiratory: Denies cough, dyspnea or wheezes Cardiovascular: Denies palpitation, chest discomfort or lower extremity swelling Gastrointestinal:  Denies nausea, heartburn or change in bowel habits Skin: Denies abnormal skin rashes Lymphatics: Denies new lymphadenopathy or easy bruising Neurological:Denies numbness, tingling or new weaknesses Behavioral/Psych: Mood is stable, no new changes  All other systems were reviewed with the patient and are negative.  PHYSICAL EXAMINATION:   Vitals:   10/18/23 1350 10/18/23 1355  BP: (!) 166/64 (!) 146/69  Pulse: 66   Resp: 17   Temp: 97.8 F (36.6 C)   SpO2: 99%     GENERAL:alert, no distress and comfortable SKIN: skin color, texture, turgor are normal, no rashes or significant lesions LUNGS: clear to auscultation and percussion with normal breathing effort HEART: regular rate & rhythm and no murmurs and  no lower extremity edema ABDOMEN:abdomen soft, non-tender and normal bowel sounds Musculoskeletal:no cyanosis of digits and no clubbing  NEURO: alert & oriented x 3 with fluent speech  LABORATORY DATA:  I have reviewed the data as listed  Lab Results  Component Value Date   WBC 13.0 (H) 06/15/2021   NEUTROABS 7.7 06/15/2021   HGB 10.2 (L) 06/15/2021   HCT 36.7 (L) 06/15/2021   MCV 79.4 (L) 06/15/2021   PLT 475 (H) 06/15/2021      Chemistry      Component Value Date/Time   NA 135  06/15/2021 1414   K 3.4 (L) 06/15/2021 1414   CL 93 (L) 06/15/2021 1414   CO2 34 (H) 06/15/2021 1414   BUN 16 06/15/2021 1414   CREATININE 0.72 06/15/2021 1414   CREATININE 0.73 08/06/2019 1254      Component Value Date/Time   CALCIUM  9.2 06/15/2021 1414   ALKPHOS 60 05/30/2021 0440   AST 20 05/30/2021 0440   ALT 48 (H) 05/30/2021 0440   BILITOT 0.6 05/30/2021 0440      Latest Reference Range & Units 10/10/23 09:36  Iron 38 - 169 ug/dL 13 (L)  UIBC 409 - 811 ug/dL 914 (H)  TIBC 782 - 956 ug/dL 213  Ferritin 30 - 086 ng/mL 10 (L)  Iron Saturation 15 - 55 % 3 (LL)  (LL): Data is critically low (L): Data is abnormally low (H): Data is abnormally high  Last EGD:2021 - Normal esophagus.  - Z- line regular, 39 cm from the incisors.  - 2 cm hiatal hernia.  - Normal stomach.  - Normal duodenal bulb.  - A single non- bleeding angiodysplastic lesion in the duodenum.  - No specimens collected.   EGD, 2019 - Normal esophagus.  - Z- line regular, 41 cm from the incisors.  - A single bleeding angiodysplastic lesion in the stomach. . Treated with a heater probe but kept bleeding. Clips ( MR conditional) were placed. Treated with argon plasma coagulation ( APC) .  - Normal duodenal bulb and second portion of the duodenum.  - No specimens collected.  Last Colonoscopy:2019 - Preparation of the colon was fair.  - One small polyp in the distal sigmoid colon, removed with a cold snare. Resected and retrieved.  - External hemorrhoids.   RADIOGRAPHIC STUDIES: I have personally reviewed the radiological images as listed and agreed with the findings in the report. None to review  ASSESSMENT & PLAN:  Patient is a 83 y.o. male referred for iron deficiency anemia  IDA (iron deficiency anemia) The most likely cause of his anemia is due to chronic blood loss-angiodysplastic lesion in stomach s/p APC in 2019  Lab Results  Component Value Date   IRON 13 (L) 10/10/2023   TIBC 430  10/10/2023   FERRITIN 10 (L) 10/10/2023    TSAT: 3  Last colonoscopy/endoscopy: Endoscopy: 2019, 2021, colonoscopy: 2019   -We discussed some of the risks, benefits, and alternatives of intravenous iron infusions. The patient is symptomatic from anemia and the iron level is critically low. He tolerated oral iron supplement poorly and desires to achieve higher levels of iron faster for adequate hematopoesis. Some of the side-effects to be expected including risks of infusion reactions, phlebitis, headaches, nausea and fatigue.  The patient is willing to proceed. Patient education material was dispensed. Goal is to keep ferritin level greater than 50 and resolution of anemia -Continue oral iron but decrease it to every other day.  Use MiraLAX  for constipation -Continue  to follow-up with Dr. Alita Irwin for endoscopy and colonoscopy  Return to clinic in 6 weeks with labs to assess response to IV iron   Orders Placed This Encounter  Procedures   CBC with Differential/Platelet    Standing Status:   Future    Expected Date:   11/26/2023    Expiration Date:   10/17/2024   Comprehensive metabolic panel with GFR    Standing Status:   Future    Expected Date:   11/26/2023    Expiration Date:   10/17/2024   Ferritin    Standing Status:   Future    Expected Date:   11/26/2023    Expiration Date:   10/17/2024   Vitamin B12    Standing Status:   Future    Expected Date:   11/26/2023    Expiration Date:   10/17/2024   Folate    Standing Status:   Future    Expected Date:   11/26/2023    Expiration Date:   10/17/2024   Iron and TIBC    Standing Status:   Future    Expected Date:   11/26/2023    Expiration Date:   10/17/2024    The total time spent in the appointment was 40 minutes encounter with patients including review of chart and various tests results, discussions about plan of care and coordination of care plan   All questions were answered. The patient knows to call the clinic with any problems, questions  or concerns. No barriers to learning was detected.   Eduardo Grade, MD 5/8/20252:28 PM

## 2023-10-22 ENCOUNTER — Inpatient Hospital Stay

## 2023-10-22 VITALS — BP 149/60 | HR 62 | Temp 97.6°F | Resp 18

## 2023-10-22 DIAGNOSIS — D500 Iron deficiency anemia secondary to blood loss (chronic): Secondary | ICD-10-CM | POA: Diagnosis not present

## 2023-10-22 DIAGNOSIS — K31819 Angiodysplasia of stomach and duodenum without bleeding: Secondary | ICD-10-CM | POA: Diagnosis not present

## 2023-10-22 MED ORDER — ACETAMINOPHEN 325 MG PO TABS: 650.0000 mg | ORAL_TABLET | Freq: Once | ORAL | Status: DC

## 2023-10-22 MED ORDER — SODIUM CHLORIDE 0.9 % IV SOLN: INTRAVENOUS | Status: DC

## 2023-10-22 MED ORDER — CETIRIZINE HCL 10 MG/ML IV SOLN
5.0000 mg | Freq: Once | INTRAVENOUS | Status: AC
  Administered 2023-10-22: 5 mg via INTRAVENOUS
  Filled 2023-10-22: qty 1

## 2023-10-22 MED ORDER — SODIUM CHLORIDE 0.9 % IV SOLN
400.0000 mg | Freq: Once | INTRAVENOUS | Status: AC
  Administered 2023-10-22: 400 mg via INTRAVENOUS
  Filled 2023-10-22: qty 20

## 2023-10-22 NOTE — Patient Instructions (Signed)

## 2023-10-22 NOTE — Progress Notes (Signed)
 Patient tolerated iron infusion with no complaints voiced.  Peripheral IV site clean and dry with good blood return noted before and after infusion. Gauze and coban wrap applied. Pt observed for 30 minutes post iron infusion without any complications. RN educated pt on the importance of notifying the clinic if any complications occur, pt verbalized understanding. VSS with discharge and left in satisfactory condition with no s/s of distress noted. All follow ups as scheduled.   [name] [employer]

## 2023-10-24 ENCOUNTER — Ambulatory Visit (INDEPENDENT_AMBULATORY_CARE_PROVIDER_SITE_OTHER): Payer: Self-pay | Admitting: Gastroenterology

## 2023-10-29 ENCOUNTER — Inpatient Hospital Stay

## 2023-10-29 VITALS — BP 119/59 | HR 58 | Temp 97.2°F | Resp 16 | Wt 201.1 lb

## 2023-10-29 DIAGNOSIS — K31819 Angiodysplasia of stomach and duodenum without bleeding: Secondary | ICD-10-CM | POA: Diagnosis not present

## 2023-10-29 DIAGNOSIS — D500 Iron deficiency anemia secondary to blood loss (chronic): Secondary | ICD-10-CM

## 2023-10-29 MED ORDER — SODIUM CHLORIDE 0.9 % IV SOLN
400.0000 mg | Freq: Once | INTRAVENOUS | Status: AC
  Administered 2023-10-29: 400 mg via INTRAVENOUS
  Filled 2023-10-29: qty 20

## 2023-10-29 MED ORDER — SODIUM CHLORIDE 0.9 % IV SOLN: INTRAVENOUS | Status: DC

## 2023-10-29 NOTE — Patient Instructions (Signed)
 [name]  10/29/2023     @PREFPERIOPPHARMACY @   Your procedure is scheduled on  11/01/2023.   Report to [facility] at  0745  A.M.   Call this number if you have problems the morning of surgery:  [phone]  If you experience any cold or flu symptoms such as cough, fever, chills, shortness of breath, etc. between now and your scheduled surgery, please notify us  at the above number.   Remember:        Your last dose of plavix  should be on 10/26/2023.   Follow the diet instructions given to you by the office.   You may drink clear liquids until  0545 am on 11/01/2023.    Clear liquids allowed are:                    Water , Juice (No red color; non-citric and without pulp; diabetics please choose diet or no sugar options), Carbonated beverages (diabetics please choose diet or no sugar options), Clear Tea (No creamer, milk, or cream, including half & half and powdered creamer), Black Coffee Only (No creamer, milk or cream, including half & half and powdered creamer), and Clear Sports drink (No red color; diabetics please choose diet or no sugar options)    Take these medicines the morning of surgery with A SIP OF WATER                    hydrocodone  (if needed), hydroxyzine, pantoprazole .    Do not wear jewelry, make-up or nail polish, including gel polish,  artificial nails, or any other type of covering on natural nails (fingers and  toes).  Do not wear lotions, powders, or perfumes, or deodorant.  Do not shave 48 hours prior to surgery.  Men may shave face and neck.  Do not bring valuables to the hospital.  [facility] is not responsible for any belongings or valuables.  Contacts, dentures or bridgework may not be worn into surgery.  Leave your suitcase in the car.  After surgery it may be brought to your room.  For patients admitted to the hospital, discharge time will be determined by your treatment team.  Patients discharged the day of surgery will not be  allowed to drive home and must have someone with them for 24 hours.    Special instructions:   DO NOT smoke tobacco or vape for 24 hours before your procedure.  Please read over the following fact sheets that you were given. Anesthesia Post-op Instructions and Care and Recovery After Surgery      Upper Endoscopy, Adult, Care After After the procedure, it is common to have a sore throat. It is also common to have: Mild stomach pain or discomfort. Bloating. Nausea. Follow these instructions at home: The instructions below may help you care for yourself at home. Your health care provider may give you more instructions. If you have questions, ask your health care provider. If you were given a sedative during the procedure, it can affect you for several hours. Do not drive or operate machinery until your health care provider says that it is safe. If you will be going home right after the procedure, plan to have a responsible adult: Take you home from the hospital or clinic. You will not be allowed to drive. Care for you for the time you are told. Follow instructions from your health care provider about what you may eat and drink. Return  to your normal activities as told by your health care provider. Ask your health care provider what activities are safe for you. Take over-the-counter and prescription medicines only as told by your health care provider. Contact a health care provider if you: Have a sore throat that lasts longer than one day. Have trouble swallowing. Have a fever. Get help right away if you: Vomit blood or your vomit looks like coffee grounds. Have bloody, black, or tarry stools. Have a very bad sore throat or you cannot swallow. Have difficulty breathing or very bad pain in your chest or abdomen. These symptoms may be an emergency. Get help right away. Call 911. Do not wait to see if the symptoms will go away. Do not drive yourself to the hospital. Summary After the  procedure, it is common to have a sore throat, mild stomach discomfort, bloating, and nausea. If you were given a sedative during the procedure, it can affect you for several hours. Do not drive until your health care provider says that it is safe. Follow instructions from your health care provider about what you may eat and drink. Return to your normal activities as told by your health care provider. This information is not intended to replace advice given to you by your health care provider. Make sure you discuss any questions you have with your health care provider. Document Revised: 09/07/2021 Document Reviewed: 09/07/2021 Elsevier Patient Education  2024 Elsevier Inc.General Anesthesia, Adult, Care After The following information offers guidance on how to care for yourself after your procedure. Your health care provider may also give you more specific instructions. If you have problems or questions, contact your health care provider. What can I expect after the procedure? After the procedure, it is common for people to: Have pain or discomfort at the IV site. Have nausea or vomiting. Have a sore throat or hoarseness. Have trouble concentrating. Feel cold or chills. Feel weak, sleepy, or tired (fatigue). Have soreness and body aches. These can affect parts of the body that were not involved in surgery. Follow these instructions at home: For the time period you were told by your health care provider:  Rest. Do not participate in activities where you could fall or become injured. Do not drive or use machinery. Do not drink alcohol. Do not take sleeping pills or medicines that cause drowsiness. Do not make important decisions or sign legal documents. Do not take care of children on your own. General instructions Drink enough fluid to keep your urine pale yellow. If you have sleep apnea, surgery and certain medicines can increase your risk for breathing problems. Follow instructions  from your health care provider about wearing your sleep device: Anytime you are sleeping, including during daytime naps. While taking prescription pain medicines, sleeping medicines, or medicines that make you drowsy. Return to your normal activities as told by your health care provider. Ask your health care provider what activities are safe for you. Take over-the-counter and prescription medicines only as told by your health care provider. Do not use any products that contain nicotine or tobacco. These products include cigarettes, chewing tobacco, and vaping devices, such as e-cigarettes. These can delay incision healing after surgery. If you need help quitting, ask your health care provider. Contact a health care provider if: You have nausea or vomiting that does not get better with medicine. You vomit every time you eat or drink. You have pain that does not get better with medicine. You cannot urinate or have bloody urine.  You develop a skin rash. You have a fever. Get help right away if: You have trouble breathing. You have chest pain. You vomit blood. These symptoms may be an emergency. Get help right away. Call 911. Do not wait to see if the symptoms will go away. Do not drive yourself to the hospital. Summary After the procedure, it is common to have a sore throat, hoarseness, nausea, vomiting, or to feel weak, sleepy, or fatigue. For the time period you were told by your health care provider, do not drive or use machinery. Get help right away if you have difficulty breathing, have chest pain, or vomit blood. These symptoms may be an emergency. This information is not intended to replace advice given to you by your health care provider. Make sure you discuss any questions you have with your health care provider. Document Revised: 08/26/2021 Document Reviewed: 08/26/2021 Elsevier Patient Education  2024 [employer].

## 2023-10-29 NOTE — Progress Notes (Signed)
 Patient ID: [name], male   DOB: Feb 16, 1941, 83 y.o.   MRN: [id number]    83 y.o. history of distant LAD stent 2009. CRFls HTN and HLD. Activity limited by leg weakness from polio. Stays busy doing remodeling work. Sees hematiology for chronically elevated WBC no signs of lymphoma  Had EGD and colonoscopy 05/13/18 for heme positive stool and iron  deficiency anemia Noted external Hemorrhoids and one small polyp removed Also had an area of gastric angiodysplasia Rx with clip/cautery  Myovue normal no ischemia 07/02/19 Echo with mild / moderate AS 06/25/19   Rx for bladder cancer. CT showed 6 cm AAA  Seen by Dr Shaunna [place] with open repair 10/14/19   [facility]/21 Presented with nausea and vomiting with hypercapnic respiratory failure requiring intubation post cath Cath with patent LAD stents no obstructive CAD moderate AS TTE mean gradient 21 peak 39 and mean at cath 05/20/21 20 mmHg ? Neuromuscular post polio issues referred to [facility] d/c on 2L [place] and non invasive ventilation NIV  TTE 08/15/21 EF 60-65% mild LVH moderate AS mean gradient 15 peak 30.7 mmHg DVI 0.41 AVA 1.3 cm2 TTE 08/18/22 EF normal moderate AS mean gradient 25.3 peak 46.8 AVA 1.1 cm2 DVI 0.37 TTE 08/16/23 EF normal mean gradient 30.3 peak 51.6 mmhg AVA 1.0 cm2 DVI 0.32   No cardiac complaints  Mechanical fall 04/23/23 C spine/Head CT normal Had left clavicle fracture and put in sling      Had a bunch of polyps removed 11/01/23    ROS: Denies fever, malais, weight loss, blurry vision, decreased visual acuity, cough, sputum, SOB, hemoptysis, pleuritic pain, palpitaitons, heartburn, abdominal pain, melena, lower extremity edema, claudication, or rash.  All other systems reviewed and negative  General: BP (!) 140/62 (BP Location: Left Arm, Patient Position: Sitting, Cuff Size: Normal)   Pulse 66   Ht 5\' 6"  (1.676 m)   Wt 202 lb (91.6 kg)   SpO2 94%   BMI 32.60 kg/m   Affect appropriate Healthy:  appears stated  age HEENT: normal Neck supple with no adenopathy JVP normal no bruits no thyromegaly Lungs clear with no wheezing and good diaphragmatic motion Heart:  S1/S2 AS murmur, no rub, gallop or click PMI normal Abdomen: benighn, BS positve, no tenderness, no AAA no bruit.  No HSM or HJR Distal pulses intact with no bruits Plus one bilateral edema Neuro non-focal Skin warm and dry Polio with atrophied right shoulder and arm  Post left TKR    Current Outpatient Medications  Medication Sig Dispense Refill   aspirin  EC 81 MG tablet Take 81 mg by mouth daily.     atorvastatin  (LIPITOR) 40 MG tablet Take 1 tablet by mouth once daily 90 tablet 2   clopidogrel  (PLAVIX ) 75 MG tablet Take 1 tablet (75 mg total) by mouth daily. 90 tablet 0   ELDERBERRY PO Take 1 tablet by mouth daily.      ferrous sulfate  325 (65 FE) MG tablet Take 1 tablet (325 mg total) by mouth 2 (two) times daily with a meal.  3   HYDROcodone -acetaminophen  (NORCO/VICODIN) 5-325 MG tablet Take 1 tablet by mouth every 4 (four) hours as needed. 12 tablet 0   hydrOXYzine (ATARAX) 10 MG tablet hydroxyzine HCl 10 mg tablet  Take 1 tablet every 8 hours by oral route as needed.  for severe itching     Iron -FA-B Cmp-C-Biot-Probiotic (FUSION PLUS) CAPS Take 1 capsule by mouth daily.     levocetirizine (XYZAL) 5  MG tablet Take 5 mg by mouth daily as needed for allergies.     nitroGLYCERIN  (NITROSTAT ) 0.4 MG SL tablet Place 1 tablet (0.4 mg total) under the tongue every 5 (five) minutes x 3 doses as needed for chest pain. 25 tablet 3   pantoprazole  (PROTONIX ) 40 MG tablet Take 1 tablet (40 mg total) by mouth daily. Take 30 min before meals     No current facility-administered medications for this visit.    Allergies  Tramadol   Electrocardiogram:   11/12/2023 SR rate 69  RBBB LAD no changes   Assessment and Plan  CAD: Distant LAD stent Cath 05/20/21 patent no osbstrucitve disease medical Rx   HTN: Well controlled.  Continue current  medications and low sodium Dash type diet.    Chol:   LDL 75 most recent labs continue  lipitor 40 mg daily    Aortic Stenosis :  TTE reviewed  08/16/23 some progression but not severe with normal EF f/u echo in a year   Edema:  Dependant -Low dose diuretic HCTZ 12.5 mg  PRN Low sodium diet .   Anemia:  F/U Ahmed post polyp removal on iron  and protonix   AAA:  Post repair Dr Shaunna [place] 10/14/19 f/u US  per [facility] Normal ABI's 07/14/22  Pulmonary:  post polio hypercapnic respiratory failure with home oxygen  and NIV F/U Dr Lydia Sams  Encouraged him to keep new appointment  Ortho:  04/23/23 mechanical fall with left clavicular fracture F/U ortho   TTE March 2026   F/U in 6 months    Janelle Mediate

## 2023-10-29 NOTE — Patient Instructions (Signed)
 [facility] [place] - A DEPT OF [facility]  Discharge Instructions: Thank you for choosing [facility] to provide your oncology and hematology care.  If you have a lab appointment with the Cancer Center - please note that after April 8th, 2024, all labs will be drawn in the cancer center.  You do not have to check in or register with the main entrance as you have in the past but will complete your check-in in the cancer center.  Wear comfortable clothing and clothing appropriate for easy access to any Portacath or PICC line.   We strive to give you quality time with your provider. You may need to reschedule your appointment if you arrive late (15 or more minutes).  Arriving late affects you and other patients whose appointments are after yours.  Also, if you miss three or more appointments without notifying the office, you may be dismissed from the clinic at the provider's discretion.      For prescription refill requests, have your pharmacy contact our office and allow 72 hours for refills to be completed.    Today you received the following iron  infusion: Venofer    To help prevent nausea and vomiting after your treatment, we encourage you to take your nausea medication as directed.  BELOW ARE SYMPTOMS THAT SHOULD BE REPORTED IMMEDIATELY: *FEVER GREATER THAN 100.4 F (38 C) OR HIGHER *CHILLS OR SWEATING *NAUSEA AND VOMITING THAT IS NOT CONTROLLED WITH YOUR NAUSEA MEDICATION *UNUSUAL SHORTNESS OF BREATH *UNUSUAL BRUISING OR BLEEDING *URINARY PROBLEMS (pain or burning when urinating, or frequent urination) *BOWEL PROBLEMS (unusual diarrhea, constipation, pain near the anus) TENDERNESS IN MOUTH AND THROAT WITH OR WITHOUT PRESENCE OF ULCERS (sore throat, sores in mouth, or a toothache) UNUSUAL RASH, SWELLING OR PAIN  UNUSUAL VAGINAL DISCHARGE OR ITCHING   Items with * indicate a potential emergency and should be followed up as soon as possible or go to  the Emergency Department if any problems should occur.  Please show the CHEMOTHERAPY ALERT CARD or IMMUNOTHERAPY ALERT CARD at check-in to the Emergency Department and triage nurse.  Should you have questions after your visit or need to cancel or reschedule your appointment, please contact [facility] [place] - A DEPT OF [facility] [phone]  and follow the prompts.  Office hours are 8:00 a.m. to 4:30 p.m. Monday - Friday. Please note that voicemails left after 4:00 p.m. may not be returned until the following business day.  We are closed weekends and major holidays. You have access to a nurse at all times for urgent questions. Please call the main number to the clinic [phone] and follow the prompts.  For any non-urgent questions, you may also contact your provider using MyChart. We now offer e-Visits for anyone 68 and older to request care online for non-urgent symptoms. For details visit [email or web address].   Also download the MyChart app! Go to the app store, search "MyChart", open the app, select [place], and log in with your MyChart username and password.

## 2023-10-29 NOTE — Progress Notes (Signed)
 Pt took pre-meds at home today.   Venofer  iron  infusion given per orders. Patient tolerated it well without problems. Vitals stable and discharged home from clinic ambulatory. Follow up as scheduled.

## 2023-10-30 ENCOUNTER — Encounter (HOSPITAL_COMMUNITY)
Admission: RE | Admit: 2023-10-30 | Discharge: 2023-10-30 | Disposition: A | Discharge status: Home / Self Care | Source: Ambulatory Visit | Attending: Gastroenterology | Admitting: Gastroenterology

## 2023-10-30 ENCOUNTER — Encounter (HOSPITAL_COMMUNITY): Payer: Self-pay

## 2023-10-30 DIAGNOSIS — J9610 Chronic respiratory failure, unspecified whether with hypoxia or hypercapnia: Secondary | ICD-10-CM | POA: Diagnosis not present

## 2023-10-30 DIAGNOSIS — D509 Iron deficiency anemia, unspecified: Secondary | ICD-10-CM | POA: Insufficient documentation

## 2023-10-30 DIAGNOSIS — J449 Chronic obstructive pulmonary disease, unspecified: Secondary | ICD-10-CM | POA: Diagnosis not present

## 2023-10-30 DIAGNOSIS — Z01812 Encounter for preprocedural laboratory examination: Secondary | ICD-10-CM | POA: Diagnosis present

## 2023-10-30 HISTORY — DX: Sleep apnea, unspecified: G47.30

## 2023-10-30 LAB — CBC WITH DIFFERENTIAL/PLATELET
Abs Immature Granulocytes: 0.05 10*3/uL (ref 0.00–0.07)
Basophils Absolute: 0.2 10*3/uL — ABNORMAL HIGH (ref 0.0–0.1)
Basophils Relative: 2 %
Eosinophils Absolute: 1.3 10*3/uL — ABNORMAL HIGH (ref 0.0–0.5)
Eosinophils Relative: 15 %
HCT: 38.4 % — ABNORMAL LOW (ref 39.0–52.0)
Hemoglobin: 10.6 g/dL — ABNORMAL LOW (ref 13.0–17.0)
Immature Granulocytes: 1 %
Lymphocytes Relative: 22 %
Lymphs Abs: 1.9 10*3/uL (ref 0.7–4.0)
MCH: 23.1 pg — ABNORMAL LOW (ref 26.0–34.0)
MCHC: 27.6 g/dL — ABNORMAL LOW (ref 30.0–36.0)
MCV: 83.7 fL (ref 80.0–100.0)
Monocytes Absolute: 0.7 10*3/uL (ref 0.1–1.0)
Monocytes Relative: 8 %
Neutro Abs: 4.6 10*3/uL (ref 1.7–7.7)
Neutrophils Relative %: 52 %
Platelets: 317 10*3/uL (ref 150–400)
RBC: 4.59 MIL/uL (ref 4.22–5.81)
RDW: 23.8 % — ABNORMAL HIGH (ref 11.5–15.5)
WBC: 8.7 10*3/uL (ref 4.0–10.5)
nRBC: 0 % (ref 0.0–0.2)

## 2023-11-01 ENCOUNTER — Ambulatory Visit (HOSPITAL_BASED_OUTPATIENT_CLINIC_OR_DEPARTMENT_OTHER)

## 2023-11-01 ENCOUNTER — Ambulatory Visit (HOSPITAL_COMMUNITY)
Admission: RE | Admit: 2023-11-01 | Discharge: 2023-11-01 | Disposition: A | Discharge status: Home / Self Care | Attending: Gastroenterology | Admitting: Gastroenterology

## 2023-11-01 ENCOUNTER — Encounter (HOSPITAL_COMMUNITY)
Admission: RE | Disposition: A | Discharge status: Home / Self Care | Payer: Self-pay | Source: Home / Self Care | Attending: Gastroenterology

## 2023-11-01 ENCOUNTER — Encounter (HOSPITAL_COMMUNITY): Payer: Self-pay | Admitting: Gastroenterology

## 2023-11-01 ENCOUNTER — Ambulatory Visit (HOSPITAL_COMMUNITY)

## 2023-11-01 DIAGNOSIS — Z7902 Long term (current) use of antithrombotics/antiplatelets: Secondary | ICD-10-CM | POA: Diagnosis not present

## 2023-11-01 DIAGNOSIS — K5520 Angiodysplasia of colon without hemorrhage: Secondary | ICD-10-CM | POA: Diagnosis not present

## 2023-11-01 DIAGNOSIS — Z8551 Personal history of malignant neoplasm of bladder: Secondary | ICD-10-CM | POA: Insufficient documentation

## 2023-11-01 DIAGNOSIS — I2510 Atherosclerotic heart disease of native coronary artery without angina pectoris: Secondary | ICD-10-CM | POA: Insufficient documentation

## 2023-11-01 DIAGNOSIS — K31811 Angiodysplasia of stomach and duodenum with bleeding: Secondary | ICD-10-CM

## 2023-11-01 DIAGNOSIS — I10 Essential (primary) hypertension: Secondary | ICD-10-CM | POA: Diagnosis not present

## 2023-11-01 DIAGNOSIS — I739 Peripheral vascular disease, unspecified: Secondary | ICD-10-CM | POA: Diagnosis not present

## 2023-11-01 DIAGNOSIS — E785 Hyperlipidemia, unspecified: Secondary | ICD-10-CM | POA: Insufficient documentation

## 2023-11-01 DIAGNOSIS — G4730 Sleep apnea, unspecified: Secondary | ICD-10-CM | POA: Insufficient documentation

## 2023-11-01 DIAGNOSIS — D509 Iron deficiency anemia, unspecified: Secondary | ICD-10-CM | POA: Diagnosis not present

## 2023-11-01 DIAGNOSIS — I252 Old myocardial infarction: Secondary | ICD-10-CM | POA: Insufficient documentation

## 2023-11-01 DIAGNOSIS — Z87891 Personal history of nicotine dependence: Secondary | ICD-10-CM | POA: Diagnosis not present

## 2023-11-01 DIAGNOSIS — Z955 Presence of coronary angioplasty implant and graft: Secondary | ICD-10-CM | POA: Insufficient documentation

## 2023-11-01 DIAGNOSIS — Z8612 Personal history of poliomyelitis: Secondary | ICD-10-CM | POA: Diagnosis not present

## 2023-11-01 DIAGNOSIS — K5521 Angiodysplasia of colon with hemorrhage: Secondary | ICD-10-CM | POA: Diagnosis not present

## 2023-11-01 HISTORY — PX: ENTEROSCOPY: SHX5533

## 2023-11-01 SURGERY — ENTEROSCOPY
Anesthesia: General

## 2023-11-01 MED ORDER — STERILE WATER FOR IRRIGATION IR SOLN
Status: DC | PRN
  Administered 2023-11-01: 60 mL

## 2023-11-01 MED ORDER — LIDOCAINE 2% (20 MG/ML) 5 ML SYRINGE
INTRAMUSCULAR | Status: DC | PRN
  Administered 2023-11-01: 100 mg via INTRAVENOUS

## 2023-11-01 MED ORDER — PANTOPRAZOLE SODIUM 40 MG PO TBEC: 40.0000 mg | DELAYED_RELEASE_TABLET | Freq: Every day | ORAL | Status: AC

## 2023-11-01 MED ORDER — IPRATROPIUM-ALBUTEROL 0.5-2.5 (3) MG/3ML IN SOLN
RESPIRATORY_TRACT | Status: AC
Start: 2023-11-01 — End: 2023-11-01
  Administered 2023-11-01: 3 mL via RESPIRATORY_TRACT
  Filled 2023-11-01: qty 3

## 2023-11-01 MED ORDER — LACTATED RINGERS IV SOLN: INTRAVENOUS | Status: DC | PRN

## 2023-11-01 MED ORDER — PHENYLEPHRINE 80 MCG/ML (10ML) SYRINGE FOR IV PUSH (FOR BLOOD PRESSURE SUPPORT)
PREFILLED_SYRINGE | INTRAVENOUS | Status: AC
Start: 2023-11-01 — End: ?
  Filled 2023-11-01: qty 10

## 2023-11-01 MED ORDER — ESMOLOL HCL 100 MG/10ML IV SOLN
INTRAVENOUS | Status: DC | PRN
  Administered 2023-11-01: 20 mg via INTRAVENOUS

## 2023-11-01 MED ORDER — PHENYLEPHRINE 80 MCG/ML (10ML) SYRINGE FOR IV PUSH (FOR BLOOD PRESSURE SUPPORT)
PREFILLED_SYRINGE | INTRAVENOUS | Status: DC | PRN
Start: 2023-11-01 — End: 2023-11-01
  Administered 2023-11-01: 160 ug via INTRAVENOUS

## 2023-11-01 MED ORDER — IPRATROPIUM-ALBUTEROL 0.5-2.5 (3) MG/3ML IN SOLN: 3.0000 mL | Freq: Once | RESPIRATORY_TRACT | Status: AC

## 2023-11-01 MED ORDER — PROPOFOL 10 MG/ML IV BOLUS
INTRAVENOUS | Status: DC | PRN
  Administered 2023-11-01: 80 mg via INTRAVENOUS

## 2023-11-01 MED ORDER — PROPOFOL 500 MG/50ML IV EMUL
INTRAVENOUS | Status: DC | PRN
Start: 2023-11-01 — End: 2023-11-01
  Administered 2023-11-01: 80 ug/kg/min via INTRAVENOUS

## 2023-11-01 MED ORDER — LACTATED RINGERS IV SOLN: INTRAVENOUS | Status: DC

## 2023-11-01 NOTE — Transfer of Care (Signed)
 Immediate Anesthesia Transfer of Care Note  Patient: [name]  Procedure(s) Performed: ENTEROSCOPY  Patient Location: Short Stay  Anesthesia Type:General  Level of Consciousness: awake  Airway & Oxygen  Therapy: Patient Spontanous Breathing  Post-op Assessment: Report given to RN and Post -op Vital signs reviewed and stable  Post vital signs: Reviewed and stable  Last Vitals:  Vitals Value Taken Time  BP    Temp    Pulse    Resp    SpO2      Last Pain:  Vitals:   11/01/23 0857  PainSc: 0-No pain         Complications: No notable events documented.

## 2023-11-01 NOTE — Anesthesia Postprocedure Evaluation (Signed)
 Anesthesia Post Note  Patient: [name]  Procedure(s) Performed: ENTEROSCOPY  Patient location during evaluation: PACU Anesthesia Type: General Level of consciousness: awake and alert Pain management: pain level controlled Vital Signs Assessment: post-procedure vital signs reviewed and stable Respiratory status: spontaneous breathing, nonlabored ventilation, respiratory function stable and patient connected to nasal cannula oxygen  Cardiovascular status: blood pressure returned to baseline and stable Postop Assessment: no apparent nausea or vomiting Anesthetic complications: no  No notable events documented.   Last Vitals:  Vitals:   11/01/23 0945 11/01/23 1003  BP: (!) 148/87 (!) 150/80  Pulse:  89  Resp:  (!) 25  Temp:    SpO2:  100%    Last Pain:  Vitals:   11/01/23 1006  TempSrc:   PainSc: 0-No pain                 Beacher Limerick

## 2023-11-01 NOTE — Op Note (Signed)
 [facility] Patient Name: [name] Procedure Date: 11/01/2023 8:39 AM MRN: [id number] Date of Birth: Mar 11, 1941 Attending MD: Terril Fetters , MD, [id number] CSN: [id number] Age: 83 Admit Type: Outpatient Procedure:                Small bowel enteroscopy Indications:              Unexplained iron  deficiency anemia, Suspected                            angiodysplasia (of intestine) with hemorrhage Providers:                Terril Fetters, MD, Graydon Lazier RN, RN, Sharlette Dayhoff                            Technician, Technician Referring MD:              Medicines:                Monitored Anesthesia Care Complications:            No immediate complications. Estimated Blood Loss:     Estimated blood loss: none. Estimated blood loss                            was minimal. Procedure:                Pre-Anesthesia Assessment:                           - Prior to the procedure, a History and Physical                            was performed, and patient medications and                            allergies were reviewed. The patient's tolerance of                            previous anesthesia was also reviewed. The risks                            and benefits of the procedure and the sedation                            options and risks were discussed with the patient.                            All questions were answered, and informed consent                            was obtained. Prior Anticoagulants: The patient has                            taken Plavix  (clopidogrel ), last dose was 5 days  prior to procedure. ASA Grade Assessment: III - A                            patient with severe systemic disease. After                            reviewing the risks and benefits, the patient was                            deemed in satisfactory condition to undergo the                            procedure.                           After obtaining informed  consent, the endoscope was                            passed under direct vision. Throughout the                            procedure, the patient's blood pressure, pulse, and                            oxygen  saturations were monitored continuously. The                            [id number]) scope was introduced through                            the mouth and advanced to the mid-jejunum. The                            small bowel enteroscopy was accomplished without                            difficulty. The patient tolerated the procedure                            well. Scope In: 9:02:40 AM Scope Out: 9:27:00 AM Total Procedure Duration: 0 hours 24 minutes 20 seconds  Findings:      The examined esophagus was normal.      Two small angioectasias with stigmata of recent bleeding were found in       the gastric body. Coagulation for hemostasis using argon plasma at 0.3       liters/minute and 20 watts was successful.      Five angioectasias with no bleeding were found in the second portion of       the duodenum. Coagulation for bleeding prevention using argon plasma at       0.3 liters/minute and 20 watts was successful.      Two angioectasias with no bleeding were found in the proximal jejunum.       Coagulation for bleeding prevention using argon plasma at 0.3       liters/minute and 20 watts was successful. Impression:               -  Normal esophagus.                           - Two recently bleeding angioectasias in the                            stomach. Treated with argon plasma coagulation                            (APC).                           - Five non-bleeding angioectasias in the duodenum.                            Treated with argon plasma coagulation (APC).                           - Two non-bleeding angioectasias in the jejunum.                            Treated with argon plasma coagulation (APC).                           - No specimens  collected. Moderate Sedation:      Per Anesthesia Care Recommendation:           - Patient has a contact number available for                            emergencies. The signs and symptoms of potential                            delayed complications were discussed with the                            patient. Return to normal activities tomorrow.                            Written discharge instructions were provided to the                            patient.                           - Resume previous diet.                           - Repeat the small bowel enteroscopy PRN.                           -Continue to follow up with hematology and at [facility]                            clinic; depending on response to iron   supplementation may pursue colonoscopy +/- capsule                            endoscopy in future Procedure Code(s):        --- Professional ---                           [phone], Small intestinal endoscopy, enteroscopy                            beyond second portion of duodenum, not including                            ileum; with control of bleeding (eg, injection,                            bipolar cautery, unipolar cautery, laser, heater                            probe, stapler, plasma coagulator) Diagnosis Code(s):        --- Professional ---                           G95.621, Angiodysplasia of stomach and duodenum                            with bleeding                           K55.20, Angiodysplasia of colon without hemorrhage                           D50.9, Iron  deficiency anemia, unspecified CPT copyright 2022 American Medical Association. All rights reserved. The codes documented in this report are preliminary and upon coder review may  be revised to meet current compliance requirements. Terril Fetters, MD Terril Fetters, MD 11/01/2023 9:38:04 AM This report has been signed electronically. Number of Addenda: 0

## 2023-11-01 NOTE — Interval H&P Note (Signed)
 History and Physical Interval Note:  11/01/2023 8:48 AM  [name]  has presented today for surgery, with the diagnosis of IRON  DEFICIENCY ANEMIA.  The various methods of treatment have been discussed with the patient and family. After consideration of risks, benefits and other options for treatment, the patient has consented to  Procedure(s) with comments: ENTEROSCOPY (N/A) - 10:00AM;ASA 3 as a surgical intervention.  The patient's history has been reviewed, patient examined, no change in status, stable for surgery.  I have reviewed the patient's chart and labs.  Questions were answered to the patient's satisfaction.     [name]

## 2023-11-01 NOTE — Discharge Instructions (Signed)

## 2023-11-01 NOTE — Anesthesia Preprocedure Evaluation (Addendum)
 Anesthesia Evaluation  Patient identified by MRN, date of birth, ID band Patient awake    Reviewed: Allergy & Precautions, H&P , NPO status , Patient's Chart, lab work & pertinent test results  Airway Mallampati: II  TM Distance: >3 FB Neck ROM: Full    Dental  (+) Partial Upper, Missing,    Pulmonary sleep apnea , former smoker   Pulmonary exam normal breath sounds clear to auscultation       Cardiovascular hypertension, + CAD, + Past MI and + Peripheral Vascular Disease  Normal cardiovascular exam+ Valvular Problems/Murmurs  Rhythm:Regular Rate:Normal  Ef 60-65% 2025 Moderate aortic stenosis CAD/stents S/p repair AAA   Neuro/Psych negative neurological ROS  negative psych ROS   GI/Hepatic Neg liver ROS,GERD  ,,  Endo/Other  negative endocrine ROS    Renal/GU negative Renal ROS  negative genitourinary   Musculoskeletal negative musculoskeletal ROS (+)    Abdominal   Peds negative pediatric ROS (+)  Hematology  (+) Blood dyscrasia, anemia   Anesthesia Other Findings   Reproductive/Obstetrics negative OB ROS                             Anesthesia Physical Anesthesia Plan  ASA: 3  Anesthesia Plan: General   Post-op Pain Management:    Induction: Intravenous  PONV Risk Score and Plan:   Airway Management Planned: Nasal Cannula  Additional Equipment:   Intra-op Plan:   Post-operative Plan:   Informed Consent: I have reviewed the patients History and Physical, chart, labs and discussed the procedure including the risks, benefits and alternatives for the proposed anesthesia with the patient or authorized representative who has indicated his/her understanding and acceptance.     Dental advisory given  Plan Discussed with: CRNA  Anesthesia Plan Comments:        Anesthesia Quick Evaluation

## 2023-11-01 NOTE — Anesthesia Procedure Notes (Signed)
 Date/Time: 11/01/2023 8:56 AM  Performed by: Sherwin Donate, CRNAPre-anesthesia Checklist: Patient identified, Emergency Drugs available, Suction available and Patient being monitored Patient Re-evaluated:Patient Re-evaluated prior to induction Oxygen  Delivery Method: Nasal cannula Induction Type: IV induction Placement Confirmation: positive ETCO2 Comments: Optiflow High Flow [place] O2 used.

## 2023-11-02 ENCOUNTER — Encounter (HOSPITAL_COMMUNITY): Payer: Self-pay | Admitting: Gastroenterology

## 2023-11-02 DIAGNOSIS — J9621 Acute and chronic respiratory failure with hypoxia: Secondary | ICD-10-CM | POA: Diagnosis not present

## 2023-11-02 DIAGNOSIS — I714 Abdominal aortic aneurysm, without rupture, unspecified: Secondary | ICD-10-CM | POA: Diagnosis not present

## 2023-11-02 DIAGNOSIS — J9622 Acute and chronic respiratory failure with hypercapnia: Secondary | ICD-10-CM | POA: Diagnosis not present

## 2023-11-09 ENCOUNTER — Telehealth (INDEPENDENT_AMBULATORY_CARE_PROVIDER_SITE_OTHER): Payer: Self-pay

## 2023-11-09 NOTE — Telephone Encounter (Signed)
 I spoke with the patient and made him aware per Dr. Alita Irwin it is reassuring he does not have  abdominal pain , fever and blood in stool which are negative and reassuring . I expect the symptoms to improve , please report back to us  next week if symptoms continue   Patient states understanding and will call us  back if no improvement.

## 2023-11-09 NOTE — Telephone Encounter (Signed)
 Hello Crystals I am glad you asked about abdominal pain , fever and blood in stool which are negative and reassuring . I expect the symptoms to improve , please report back to us  next week if symptoms continue

## 2023-11-09 NOTE — Telephone Encounter (Signed)
 Patient had enteroscopy done on 11/01/2023, now has issues with gas and loose stools the past two days.Is this normal? Denies any blood in stools, abdominal pain,fever. Please advise.

## 2023-11-12 ENCOUNTER — Ambulatory Visit: Payer: Medicare HMO | Attending: Cardiovascular Disease | Admitting: Cardiovascular Disease

## 2023-11-12 VITALS — BP 140/62 | HR 66 | Ht 66.0 in | Wt 202.0 lb

## 2023-11-12 DIAGNOSIS — I350 Nonrheumatic aortic (valve) stenosis: Secondary | ICD-10-CM

## 2023-11-12 DIAGNOSIS — I10 Essential (primary) hypertension: Secondary | ICD-10-CM

## 2023-11-12 DIAGNOSIS — I714 Abdominal aortic aneurysm, without rupture, unspecified: Secondary | ICD-10-CM | POA: Diagnosis not present

## 2023-11-12 NOTE — Patient Instructions (Signed)
 Medication Instructions:  Your physician recommends that you continue on your current medications as directed. Please refer to the Current Medication list given to you today.  *If you need a refill on your cardiac medications before your next appointment, please call your pharmacy*  Lab Work: NONE   If you have labs (blood work) drawn today and your tests are completely normal, you will receive your results only by: MyChart Message (if you have MyChart) OR A paper copy in the mail If you have any lab test that is abnormal or we need to change your treatment, we will call you to review the results.  Testing/Procedures: Your physician has requested that you have an echocardiogram. Echocardiography is a painless test that uses sound waves to create images of your heart. It provides your doctor with information about the size and shape of your heart and how well your heart's chambers and valves are working. This procedure takes approximately one hour. There are no restrictions for this procedure. Please do NOT wear cologne, perfume, aftershave, or lotions (deodorant is allowed). Please arrive 15 minutes prior to your appointment time.  Please note: We ask at that you not bring children with you during ultrasound (echo/ vascular) testing. Due to room size and safety concerns, children are not allowed in the ultrasound rooms during exams. Our front office staff cannot provide observation of children in our lobby area while testing is being conducted. An adult accompanying a patient to their appointment will only be allowed in the ultrasound room at the discretion of the ultrasound technician under special circumstances. We apologize for any inconvenience.   Follow-Up: At [facility], you and your health needs are our priority.  As part of our continuing mission to provide you with exceptional heart care, our providers are all part of one team.  This team includes your primary Cardiologist  (physician) and Advanced Practice Providers or APPs (Physician Assistants and Nurse Practitioners) who all work together to provide you with the care you need, when you need it.  Your next appointment:    March 2026   Provider:   You may see Janelle Mediate, MD or one of the following Advanced Practice Providers on your designated Care Team:   Woodfin Hays, PA-C  [place], [place] Theotis Flake, [place]     We recommend signing up for the patient portal called "MyChart".  Sign up information is provided on this After Visit Summary.  MyChart is used to connect with patients for Virtual Visits (Telemedicine).  Patients are able to view lab/test results, encounter notes, upcoming appointments, etc.  Non-urgent messages can be sent to your provider as well.   To learn more about what you can do with MyChart, go to [email or web address].   Other Instructions Thank you for choosing [facility]!

## 2023-11-26 ENCOUNTER — Inpatient Hospital Stay: Attending: Hematology

## 2023-11-26 DIAGNOSIS — I998 Other disorder of circulatory system: Secondary | ICD-10-CM | POA: Diagnosis not present

## 2023-11-26 DIAGNOSIS — D509 Iron deficiency anemia, unspecified: Secondary | ICD-10-CM | POA: Insufficient documentation

## 2023-11-26 DIAGNOSIS — E538 Deficiency of other specified B group vitamins: Secondary | ICD-10-CM | POA: Insufficient documentation

## 2023-11-26 LAB — COMPREHENSIVE METABOLIC PANEL WITH GFR
ALT: 24 U/L (ref 0–44)
AST: 24 U/L (ref 15–41)
Albumin: 4 g/dL (ref 3.5–5.0)
Alkaline Phosphatase: 53 U/L (ref 38–126)
Anion gap: 11 (ref 5–15)
BUN: 17 mg/dL (ref 8–23)
CO2: 27 mmol/L (ref 22–32)
Calcium: 9.6 mg/dL (ref 8.9–10.3)
Chloride: 103 mmol/L (ref 98–111)
Creatinine, Ser: 0.49 mg/dL — ABNORMAL LOW (ref 0.61–1.24)
GFR, Estimated: >60 mL/min (ref >60)
Glucose, Bld: 136 mg/dL — ABNORMAL HIGH (ref 70–99)
Potassium: 3.5 mmol/L (ref 3.5–5.1)
Sodium: 141 mmol/L (ref 135–145)
Total Bilirubin: 0.6 mg/dL (ref 0.0–1.2)
Total Protein: 7.4 g/dL (ref 6.5–8.1)

## 2023-11-26 LAB — IRON AND TIBC
Iron: 49 ug/dL (ref 45–182)
Saturation Ratios: 12 % — ABNORMAL LOW (ref 17.9–39.5)
TIBC: 421 ug/dL (ref 250–450)
UIBC: 372 ug/dL

## 2023-11-26 LAB — FOLATE: Folate: 14.6 ng/mL (ref >5.9)

## 2023-11-26 LAB — CBC WITH DIFFERENTIAL/PLATELET
Abs Immature Granulocytes: 0.05 10*3/uL (ref 0.00–0.07)
Basophils Absolute: 0.1 10*3/uL (ref 0.0–0.1)
Basophils Relative: 2 %
Eosinophils Absolute: 1.2 10*3/uL — ABNORMAL HIGH (ref 0.0–0.5)
Eosinophils Relative: 13 %
HCT: 41.4 % (ref 39.0–52.0)
Hemoglobin: 12 g/dL — ABNORMAL LOW (ref 13.0–17.0)
Immature Granulocytes: 1 %
Lymphocytes Relative: 26 %
Lymphs Abs: 2.4 10*3/uL (ref 0.7–4.0)
MCH: 25.1 pg — ABNORMAL LOW (ref 26.0–34.0)
MCHC: 29 g/dL — ABNORMAL LOW (ref 30.0–36.0)
MCV: 86.4 fL (ref 80.0–100.0)
Monocytes Absolute: 0.8 10*3/uL (ref 0.1–1.0)
Monocytes Relative: 8 %
Neutro Abs: 4.7 10*3/uL (ref 1.7–7.7)
Neutrophils Relative %: 50 %
Platelets: 297 10*3/uL (ref 150–400)
RBC: 4.79 MIL/uL (ref 4.22–5.81)
RDW: 24.1 % — ABNORMAL HIGH (ref 11.5–15.5)
WBC: 9.3 10*3/uL (ref 4.0–10.5)
nRBC: 0 % (ref 0.0–0.2)

## 2023-11-26 LAB — VITAMIN B12: Vitamin B-12: 344 pg/mL (ref 180–914)

## 2023-11-26 LAB — FERRITIN: Ferritin: 28 ng/mL (ref 24–336)

## 2023-11-30 DIAGNOSIS — J9610 Chronic respiratory failure, unspecified whether with hypoxia or hypercapnia: Secondary | ICD-10-CM | POA: Diagnosis not present

## 2023-11-30 DIAGNOSIS — J449 Chronic obstructive pulmonary disease, unspecified: Secondary | ICD-10-CM | POA: Diagnosis not present

## 2023-12-03 ENCOUNTER — Inpatient Hospital Stay: Admitting: Oncology

## 2023-12-03 VITALS — BP 159/93 | HR 82 | Temp 98.3°F | Resp 18 | Wt 203.7 lb

## 2023-12-03 DIAGNOSIS — I998 Other disorder of circulatory system: Secondary | ICD-10-CM | POA: Diagnosis not present

## 2023-12-03 DIAGNOSIS — E538 Deficiency of other specified B group vitamins: Secondary | ICD-10-CM | POA: Insufficient documentation

## 2023-12-03 DIAGNOSIS — D500 Iron deficiency anemia secondary to blood loss (chronic): Secondary | ICD-10-CM | POA: Diagnosis not present

## 2023-12-03 DIAGNOSIS — J9621 Acute and chronic respiratory failure with hypoxia: Secondary | ICD-10-CM | POA: Diagnosis not present

## 2023-12-03 DIAGNOSIS — I714 Abdominal aortic aneurysm, without rupture, unspecified: Secondary | ICD-10-CM | POA: Diagnosis not present

## 2023-12-03 DIAGNOSIS — J9622 Acute and chronic respiratory failure with hypercapnia: Secondary | ICD-10-CM | POA: Diagnosis not present

## 2023-12-03 DIAGNOSIS — D509 Iron deficiency anemia, unspecified: Secondary | ICD-10-CM | POA: Diagnosis not present

## 2023-12-03 MED ORDER — VITAMIN B-12 1000 MCG PO TABS: 1000.0000 ug | ORAL_TABLET | Freq: Every day | ORAL | 2 refills | Status: AC

## 2023-12-03 NOTE — Progress Notes (Signed)
 [facility] at [facility]  HEMATOLOGY FOLLOW-UP VISIT  Benjamin Norleen PEDLAR, MD  REASON FOR FOLLOW-UP: Iron  deficiency anemia  ASSESSMENT & PLAN:  Patient is a 83 y.o. male following for iron  deficiency anemia  IDA (iron  deficiency anemia) The most likely cause of his anemia is due to chronic blood loss Recent enteroscopy with multiple angiectasia's s/p APC Iron  levels and hemoglobin improved with IV iron .  TSAT still less than 15 and ferritin less than 50  - Will administer 2 more doses of IV Venofer .  Reemphasized side effects including nausea, headache, allergic reactions - Start taking oral iron  every other day.  Use MiraLAX  for constipation - Continue to follow with GI  Return to clinic in 3 months with labs  Vitamin B12 deficiency Patient has mild vitamin B12 deficiency with levels less than 400  - Start oral vitamin B12 1000 mcg daily   Orders Placed This Encounter  Procedures   Ferritin    Standing Status:   Future    Expected Date:   03/03/2024    Expiration Date:   06/01/2024   Folate    Standing Status:   Future    Expected Date:   03/03/2024    Expiration Date:   06/01/2024   Vitamin B12    Standing Status:   Future    Expected Date:   03/03/2024    Expiration Date:   06/01/2024   CBC with Differential/Platelet    Standing Status:   Future    Expected Date:   03/03/2024    Expiration Date:   06/01/2024   Comprehensive metabolic panel with GFR    Standing Status:   Future    Expected Date:   03/03/2024    Expiration Date:   06/01/2024   Iron  and TIBC    Standing Status:   Future    Expected Date:   03/03/2024    Expiration Date:   06/01/2024    The total time spent in the appointment was 20 minutes encounter with patients including review of chart and various tests results, discussions about plan of care and coordination of care plan   All questions were answered. The patient knows to call the clinic with any problems, questions or  concerns. No barriers to learning was detected.  Mickiel Dry, MD 6/23/202511:25 AM   SUMMARY OF HEMATOLOGIC HISTORY: Iron  deficiency anemia secondary to GI blood loss. - S/p IV Venofer  400 mg X 2 doses on 10/22/2023 and 10/29/2023 - Enteroscopy with multiple angiectasia's in stomach, duodenum and jejunum.  S/p APC  INTERVAL HISTORY: [name] 83 y.o. male following for iron  deficiency anemia. Patient was accompanied by his wife today.  He has no complaints today.  He reports feeling significantly better and his energy levels and fatigue after IV iron  administration.  He has a follow-up with GI next week.   I have reviewed the past medical history, past surgical history, social history and family history with the patient   ALLERGIES:  is allergic to tramadol .  MEDICATIONS:  Current Outpatient Medications  Medication Sig Dispense Refill   cyanocobalamin (VITAMIN B12) 1000 MCG tablet Take 1 tablet (1,000 mcg total) by mouth daily. 90 tablet 2   aspirin  EC 81 MG tablet Take 81 mg by mouth daily.     atorvastatin  (LIPITOR) 40 MG tablet Take 1 tablet by mouth once daily 90 tablet 2   clopidogrel  (PLAVIX ) 75 MG tablet Take 1 tablet (75 mg total) by mouth daily. 90  tablet 0   ELDERBERRY PO Take 1 tablet by mouth daily.      ferrous sulfate  325 (65 FE) MG tablet Take 1 tablet (325 mg total) by mouth 2 (two) times daily with a meal.  3   HYDROcodone -acetaminophen  (NORCO/VICODIN) 5-325 MG tablet Take 1 tablet by mouth every 4 (four) hours as needed. 12 tablet 0   hydrOXYzine (ATARAX) 10 MG tablet hydroxyzine HCl 10 mg tablet  Take 1 tablet every 8 hours by oral route as needed.  for severe itching     Iron -FA-B Cmp-C-Biot-Probiotic (FUSION PLUS) CAPS Take 1 capsule by mouth daily.     levocetirizine (XYZAL) 5 MG tablet Take 5 mg by mouth daily as needed for allergies.     nitroGLYCERIN  (NITROSTAT ) 0.4 MG SL tablet Place 1 tablet (0.4 mg total) under the tongue every 5 (five) minutes x 3  doses as needed for chest pain. 25 tablet 3   pantoprazole  (PROTONIX ) 40 MG tablet Take 1 tablet (40 mg total) by mouth daily. Take 30 min before meals     No current facility-administered medications for this visit.     REVIEW OF SYSTEMS:   Constitutional: Denies fevers, chills or night sweats Eyes: Denies blurriness of vision Ears, nose, mouth, throat, and face: Denies mucositis or sore throat Respiratory: Denies cough, dyspnea or wheezes Cardiovascular: Denies palpitation, chest discomfort or lower extremity swelling Gastrointestinal:  Denies nausea, heartburn or change in bowel habits Skin: Denies abnormal skin rashes Lymphatics: Denies new lymphadenopathy or easy bruising Neurological:Denies numbness, tingling or new weaknesses Behavioral/Psych: Mood is stable, no new changes  All other systems were reviewed with the patient and are negative.  PHYSICAL EXAMINATION:   Vitals:   12/03/23 0945  BP: (!) 159/93  Pulse: 82  Resp: 18  Temp: 98.3 F (36.8 C)  SpO2: 92%    GENERAL:alert, no distress and comfortable SKIN: skin color, texture, turgor are normal, no rashes or significant lesions LUNGS: clear to auscultation and percussion with normal breathing effort HEART: regular rate & rhythm and no murmurs and no lower extremity edema ABDOMEN:abdomen soft, non-tender and normal bowel sounds Musculoskeletal:no cyanosis of digits and no clubbing  NEURO: alert & oriented x 3 with fluent speech  LABORATORY DATA:  I have reviewed the data as listed  Lab Results  Component Value Date   WBC 9.3 11/26/2023   NEUTROABS 4.7 11/26/2023   HGB 12.0 (L) 11/26/2023   HCT 41.4 11/26/2023   MCV 86.4 11/26/2023   PLT 297 11/26/2023      Chemistry      Component Value Date/Time   NA 141 11/26/2023 1014   K 3.5 11/26/2023 1014   CL 103 11/26/2023 1014   CO2 27 11/26/2023 1014   BUN 17 11/26/2023 1014   CREATININE 0.49 (L) 11/26/2023 1014   CREATININE 0.73 08/06/2019 1254       Component Value Date/Time   CALCIUM  9.6 11/26/2023 1014   ALKPHOS 53 11/26/2023 1014   AST 24 11/26/2023 1014   ALT 24 11/26/2023 1014   BILITOT 0.6 11/26/2023 1014       Latest Reference Range & Units 11/26/23 10:14 11/26/23 10:15  Iron  45 - 182 ug/dL 49   UIBC ug/dL 627   TIBC 749 - 549 ug/dL 578   Saturation Ratios 17.9 - 39.5 % 12 (L)   Ferritin 24 - 336 ng/mL 28   Folate >5.9 ng/mL  14.6  (L): Data is abnormally low  Small bowel enteroscopy: 11/01/2023 Impression:  -  Normal esophagus. - Two recently bleeding angioectasias in the stomach. Treated with argon plasma coagulation ( APC) .  - Five non- bleeding angioectasias in the duodenum. Treated with argon plasma coagulation ( APC) .  - Two non- bleeding angioectasias in the jejunum. Treated with argon plasma coagulation ( APC) .  - No specimens collected.  Last [id number] - Normal esophagus.  - Z- line regular, 39 cm from the incisors.  - 2 cm hiatal hernia.  - Normal stomach.  - Normal duodenal bulb.  - A single non- bleeding angiodysplastic lesion in the duodenum.  - No specimens collected.   EGD, 2019 - Normal esophagus.  - Z- line regular, 41 cm from the incisors.  - A single bleeding angiodysplastic lesion in the stomach. . Treated with a heater probe but kept bleeding. Clips ( MR conditional) were placed. Treated with argon plasma coagulation ( APC) .  - Normal duodenal bulb and second portion of the duodenum.  - No specimens collected.   Last Colonoscopy:2019 - Preparation of the colon was fair.  - One small polyp in the distal sigmoid colon, removed with a cold snare. Resected and retrieved.  - External hemorrhoids.  RADIOGRAPHIC STUDIES: I have personally reviewed the radiological images as listed and agreed with the findings in the report.  None new to review

## 2023-12-03 NOTE — Assessment & Plan Note (Signed)
 The most likely cause of his anemia is due to chronic blood loss Recent enteroscopy with multiple angiectasia's s/p APC Iron  levels and hemoglobin improved with IV iron .  TSAT still less than 15 and ferritin less than 50  - Will administer 2 more doses of IV Venofer .  Reemphasized side effects including nausea, headache, allergic reactions - Start taking oral iron  every other day.  Use MiraLAX  for constipation - Continue to follow with GI  Return to clinic in 3 months with labs

## 2023-12-03 NOTE — Assessment & Plan Note (Signed)
 Patient has mild vitamin B12 deficiency with levels less than 400  - Start oral vitamin B12 1000 mcg daily

## 2023-12-07 ENCOUNTER — Inpatient Hospital Stay

## 2023-12-07 VITALS — BP 141/56 | HR 47 | Temp 98.2°F | Resp 16

## 2023-12-07 DIAGNOSIS — D500 Iron deficiency anemia secondary to blood loss (chronic): Secondary | ICD-10-CM

## 2023-12-07 DIAGNOSIS — I998 Other disorder of circulatory system: Secondary | ICD-10-CM | POA: Diagnosis not present

## 2023-12-07 DIAGNOSIS — D509 Iron deficiency anemia, unspecified: Secondary | ICD-10-CM | POA: Diagnosis not present

## 2023-12-07 DIAGNOSIS — E538 Deficiency of other specified B group vitamins: Secondary | ICD-10-CM | POA: Diagnosis not present

## 2023-12-07 MED ORDER — SODIUM CHLORIDE 0.9 % IV SOLN
400.0000 mg | Freq: Once | INTRAVENOUS | Status: AC
  Administered 2023-12-07: 400 mg via INTRAVENOUS
  Filled 2023-12-07: qty 400

## 2023-12-07 MED ORDER — SODIUM CHLORIDE 0.9 % IV SOLN: INTRAVENOUS | Status: DC

## 2023-12-07 MED ORDER — CETIRIZINE HCL 10 MG/ML IV SOLN
5.0000 mg | Freq: Once | INTRAVENOUS | Status: AC
  Administered 2023-12-07: 5 mg via INTRAVENOUS
  Filled 2023-12-07: qty 1

## 2023-12-07 NOTE — Patient Instructions (Signed)
 [facility] [place] - A DEPT OF [facility]  Discharge Instructions: Thank you for choosing [facility] to provide your oncology and hematology care.  If you have a lab appointment with the Cancer Center - please note that after April 8th, 2024, all labs will be drawn in the cancer center.  You do not have to check in or register with the main entrance as you have in the past but will complete your check-in in the cancer center.  Wear comfortable clothing and clothing appropriate for easy access to any Portacath or PICC line.   We strive to give you quality time with your provider. You may need to reschedule your appointment if you arrive late (15 or more minutes).  Arriving late affects you and other patients whose appointments are after yours.  Also, if you miss three or more appointments without notifying the office, you may be dismissed from the clinic at the provider's discretion.      For prescription refill requests, have your pharmacy contact our office and allow 72 hours for refills to be completed.    Today you received Venofer IV iron infusion.     BELOW ARE SYMPTOMS THAT SHOULD BE REPORTED IMMEDIATELY: *FEVER GREATER THAN 100.4 F (38 C) OR HIGHER *CHILLS OR SWEATING *NAUSEA AND VOMITING THAT IS NOT CONTROLLED WITH YOUR NAUSEA MEDICATION *UNUSUAL SHORTNESS OF BREATH *UNUSUAL BRUISING OR BLEEDING *URINARY PROBLEMS (pain or burning when urinating, or frequent urination) *BOWEL PROBLEMS (unusual diarrhea, constipation, pain near the anus) TENDERNESS IN MOUTH AND THROAT WITH OR WITHOUT PRESENCE OF ULCERS (sore throat, sores in mouth, or a toothache) UNUSUAL RASH, SWELLING OR PAIN  UNUSUAL VAGINAL DISCHARGE OR ITCHING   Items with * indicate a potential emergency and should be followed up as soon as possible or go to the Emergency Department if any problems should occur.  Please show the CHEMOTHERAPY ALERT CARD or IMMUNOTHERAPY ALERT CARD at  check-in to the Emergency Department and triage nurse.  Should you have questions after your visit or need to cancel or reschedule your appointment, please contact [facility]  - A DEPT OF Eligha Bridegroom [facility] [phone]  and follow the prompts.  Office hours are 8:00 a.m. to 4:30 p.m. Monday - Friday. Please note that voicemails left after 4:00 p.m. may not be returned until the following business day.  We are closed weekends and major holidays. You have access to a nurse at all times for urgent questions. Please call the main number to the clinic [phone] and follow the prompts.  For any non-urgent questions, you may also contact your provider using MyChart. We now offer e-Visits for anyone 13 and older to request care online for non-urgent symptoms. For details visit [email or web address].   Also download the MyChart app! Go to the app store, search "MyChart", open the app, select [place], and log in with your MyChart username and password.

## 2023-12-07 NOTE — Progress Notes (Signed)
 Patient presents today for iron  infusion.  Patient is in satisfactory condition with no new complaints voiced.  Vital signs are stable.  We will proceed with infusion per provider orders.    Patient took Tylenol  at home prior to arrival. Peripheral IV started with good blood return pre and post infusion.  400 mg given today per MD orders. Tolerated infusion without adverse affects. Vital signs stable. No complaints at this time. Discharged from clinic ambulatory in stable condition. Alert and oriented x 3. F/U with [facility] as scheduled.

## 2023-12-10 ENCOUNTER — Encounter (INDEPENDENT_AMBULATORY_CARE_PROVIDER_SITE_OTHER): Payer: Self-pay | Admitting: Gastroenterology

## 2023-12-10 ENCOUNTER — Ambulatory Visit (INDEPENDENT_AMBULATORY_CARE_PROVIDER_SITE_OTHER): Admitting: Gastroenterology

## 2023-12-10 VITALS — BP 134/69 | HR 63 | Temp 98.3°F | Ht 66.0 in | Wt 202.6 lb

## 2023-12-10 DIAGNOSIS — D509 Iron deficiency anemia, unspecified: Secondary | ICD-10-CM | POA: Diagnosis not present

## 2023-12-10 DIAGNOSIS — K31819 Angiodysplasia of stomach and duodenum without bleeding: Secondary | ICD-10-CM

## 2023-12-10 DIAGNOSIS — Z8719 Personal history of other diseases of the digestive system: Secondary | ICD-10-CM | POA: Diagnosis not present

## 2023-12-10 DIAGNOSIS — D500 Iron deficiency anemia secondary to blood loss (chronic): Secondary | ICD-10-CM

## 2023-12-10 NOTE — Progress Notes (Signed)
 Renata Gambino Faizan Brianna Esson , M.D. Gastroenterology & Hepatology [facility] Gastroenterology [address] [place], [place] [address] Primary Care Physician: [name], MD [address] [name] [address]  Chief Complaint:  Iron  deficiency anemia   History of Present Illness: [name] is a 83 y.o. male with hyperlipidemia, hypertension, AAA, coronary artery disease status post remote LAD stent placement, bladder cancer status post TURBT, IDA, chronic leukocytosis, history of polio with residual right arm atrophy,  who presents for follow up  of Iron  deficiency anemia    Patient had underwent small bowel push enteroscopy with treatment of angiectasia's . ferritin has improved from 10--->28  as well as anemia (Hbg 10--> 12) after treatment of small bowel angiectasia.  Patient has been following with hematology and getting IV iron  infusion and p.o. iron  every other day  The patient denies having any nausea, vomiting, fever, chills, hematochezia, melena, hematemesis, abdominal distention, abdominal pain, diarrhea, jaundice, pruritus or weight loss.  Patient reports that he started taking iron  tablets orally but is unable to tolerate them because it is  messing up his stomach and also causing diarrhea rather.  Denies taking any NSAIDs  Patient has chronic IDA wherr December 2019 and EGD with large gastric AV malformation with stigmata of bleeding treated with APC and clip.   Last EGD: 10/2023  - Two recently bleeding angioectasias in the stomach. Treated with argon plasma coagulation ( APC) . - Five non- bleeding angioectasias in the duodenum. Treated with argon plasma coagulation ( APC) . - Two non- bleeding angioectasias in the jejunum. Treated with argon plasma coagulation ( APC) . - No specimens collected.   2021  - Normal esophagus. - Z- line regular, 39 cm from the incisors. - 2 cm hiatal hernia. - Normal stomach. - Normal duodenal bulb. - A  single non- bleeding angiodysplastic lesion in the duodenum. - No specimens collected.  2019 - Normal esophagus. - Z- line regular, 41 cm from the incisors. - A single bleeding angiodysplastic lesion in the stomach. . Treated with a heater probe but kept bleeding. Clips ( MR conditional) were placed. Treated with argon plasma coagulation ( APC) . - Normal duodenal bulb and second portion of the duodenum. - No specimens collected.  Last labs from 0 08/12/2023 downtrending hemoglobin 10.1 MCV 81 normal liver enzymes A1c 6.2 Last Colonoscopy:2019  - Preparation of the colon was fair. - One small polyp in the distal sigmoid colon, removed with a cold snare. Resected and retrieved. - External hemorrhoids.  Past Medical History: Past Medical History:  Diagnosis Date   AAA (abdominal aortic aneurysm) (HCC)    Bladder cancer (HCC)    s/p TURBT 09/26/19   Blood transfusion without reported diagnosis    Coronary artery disease    stents placed   GERD (gastroesophageal reflux disease)    HOH (hard of hearing)    Hyperlipidemia    Hypertension    Iron  deficiency anemia 05/14/2018   Polio    hx of. right arm atrophy   Sleep apnea     Past Surgical History: Past Surgical History:  Procedure Laterality Date   ABDOMINAL AORTIC ANEURYSM REPAIR N/A 10/14/2019   Procedure: OPEN ABDOMINAL AORTIC ANEURYSM REPAIR USING HEMASHIELD GOLD 16mm x 30cm GRAFT;  Surgeon: [name], MD;  Location: [facility] OR;  Service: Vascular;  Laterality: N/A;   CARDIAC CATHETERIZATION  07/09/2007   x 3 stents   CHOLECYSTECTOMY     COLONOSCOPY WITH  PROPOFOL  N/A 05/31/2018   Procedure: COLONOSCOPY WITH PROPOFOL ;  Surgeon: [name], MD;  [facility];  Service: Endoscopy;  Laterality: N/A;  1:45   CORONARY STENT PLACEMENT     x3 vessels bifurcated single vessel   CYSTOSCOPY W/ RETROGRADES Bilateral 09/26/2019   Procedure: CYSTOSCOPY WITH RETROGRADE PYELOGRAM;  Surgeon: [name], MD;   Location: [facility] OR[facility];  Service: Urology;  Laterality: Bilateral;   ENTEROSCOPY N/A 11/01/2023   Procedure: ENTEROSCOPY;  Surgeon: [name], MD;  [facility];  Service: Endoscopy;  Laterality: N/A;  10:00AM;ASA 3   ESOPHAGOGASTRODUODENOSCOPY (EGD) WITH PROPOFOL  N/A 05/31/2018   Procedure: ESOPHAGOGASTRODUODENOSCOPY (EGD) WITH PROPOFOL ;  Surgeon: [name], MD;  [facility];  Service: Endoscopy;  Laterality: N/A;   ESOPHAGOGASTRODUODENOSCOPY (EGD) WITH PROPOFOL  N/A 08/08/2019   Procedure: ESOPHAGOGASTRODUODENOSCOPY (EGD) WITH PROPOFOL ;  Surgeon: [name], MD;  [facility];  Service: Endoscopy;  Laterality: N/A;   KNEE SURGERY     Left knee  steel rod   LEFT HEART CATH AND CORONARY ANGIOGRAPHY N/A 05/20/2021   Procedure: LEFT HEART CATH AND CORONARY ANGIOGRAPHY;  Surgeon: [name], MD;  [facility] INVASIVE CV LAB;  Service: Cardiovascular;  Laterality: N/A;   POLYPECTOMY  05/31/2018   Procedure: POLYPECTOMY;  Surgeon: [name], MD;  [facility];  Service: Endoscopy;;  sigmoid   TRANSURETHRAL RESECTION OF BLADDER TUMOR N/A 09/26/2019   Procedure: TRANSURETHRAL RESECTION OF BLADDER TUMOR (TURBT);  Surgeon: [name], MD;  Location: [facility];  Service: Urology;  Laterality: N/A;  1 HR    Family History: Family History  Problem Relation Age of Onset   Heart attack Brother 72       died   Hypertension Mother    Heart attack Father     Social History: Social History   Tobacco Use  Smoking Status Former   Current packs/day: 0.00   Average packs/day: 1 pack/day for 15.0 years (15.0 ttl pk-yrs)   Types: Cigarettes   Start date: 06/12/1989   Quit date: 06/12/2004   Years since quitting: 19.5  Smokeless Tobacco Never   Social History   Substance and Sexual Activity  Alcohol Use No   Alcohol/week: 0.0 standard drinks of alcohol   Social History   Substance and Sexual Activity  Drug Use No     Allergies: Allergies  Allergen Reactions   Tramadol  Other (See Comments)    Excessive sleepiness/difficulty waking patient    Medications: Current Outpatient Medications  Medication Sig Dispense Refill   aspirin  EC 81 MG tablet Take 81 mg by mouth daily.     atorvastatin  (LIPITOR) 40 MG tablet Take 1 tablet by mouth once daily 90 tablet 2   clopidogrel  (PLAVIX ) 75 MG tablet Take 1 tablet (75 mg total) by mouth daily. 90 tablet 0   cyanocobalamin (VITAMIN B12) 1000 MCG tablet Take 1 tablet (1,000 mcg total) by mouth daily. 90 tablet 2   ELDERBERRY PO Take 1 tablet by mouth daily.      ferrous sulfate  325 (65 FE) MG tablet Take 1 tablet (325 mg total) by mouth 2 (two) times daily with a meal.  3   HYDROcodone -acetaminophen  (NORCO/VICODIN) 5-325 MG tablet Take 1 tablet by mouth every 4 (four) hours as needed. 12 tablet 0   hydrOXYzine (ATARAX) 10 MG tablet hydroxyzine HCl 10 mg tablet  Take 1 tablet every 8 hours by oral route as needed.  for severe itching  Iron -FA-B Cmp-C-Biot-Probiotic (FUSION PLUS) CAPS Take 1 capsule by mouth daily.     levocetirizine (XYZAL) 5 MG tablet Take 5 mg by mouth daily as needed for allergies.     nitroGLYCERIN  (NITROSTAT ) 0.4 MG SL tablet Place 1 tablet (0.4 mg total) under the tongue every 5 (five) minutes x 3 doses as needed for chest pain. 25 tablet 3   pantoprazole  (PROTONIX ) 40 MG tablet Take 1 tablet (40 mg total) by mouth daily. Take 30 min before meals     No current facility-administered medications for this visit.    Review of Systems: GENERAL: negative for malaise, night sweats HEENT: No changes in hearing or vision, no nose bleeds or other nasal problems. NECK: Negative for lumps, goiter, pain and significant neck swelling RESPIRATORY: Negative for cough, wheezing CARDIOVASCULAR: Negative for chest pain, leg swelling, palpitations, orthopnea GI: SEE HPI MUSCULOSKELETAL: Negative for joint pain or swelling, back pain, and muscle  pain. SKIN: Negative for lesions, rash HEMATOLOGY Negative for prolonged bleeding, bruising easily, and swollen nodes. ENDOCRINE: Negative for cold or heat intolerance, polyuria, polydipsia and goiter. NEURO: negative for tremor, gait imbalance, syncope and seizures. The remainder of the review of systems is noncontributory.   Physical Exam: BP 134/69   Pulse 63   Temp 98.3 F (36.8 C)   Ht 5' 6 (1.676 m)   Wt 202 lb 9.6 oz (91.9 kg)   BMI 32.70 kg/m  GENERAL: The patient is AO x3, in no acute distress. HEENT: Head is normocephalic and atraumatic. EOMI are intact. Mouth is well hydrated and without lesions. NECK: Supple. No masses LUNGS: Clear to auscultation. No presence of rhonchi/wheezing/rales. Adequate chest expansion HEART: RRR, normal s1 and s2. ABDOMEN: Soft, nontender, no guarding, no peritoneal signs, and nondistended. BS +. No masses.  Imaging/Labs: as above     Latest Ref Rng & Units 11/26/2023   10:14 AM 10/30/2023   11:19 AM 06/15/2021    2:14 PM  CBC  WBC 4.0 - 10.5 K/uL 9.3  8.7  13.0   Hemoglobin 13.0 - 17.0 g/dL 87.9  89.3  89.7   Hematocrit 39.0 - 52.0 % 41.4  38.4  36.7   Platelets 150 - 400 K/uL 297  317  475    Lab Results  Component Value Date   IRON  49 11/26/2023   TIBC 421 11/26/2023   FERRITIN 28 11/26/2023    I personally reviewed and interpreted the available labs, imaging and endoscopic files.  Impression and Plan:  [name] is a 83 y.o. male with hyperlipidemia, hypertension, AAA, coronary artery disease status post remote LAD stent placement, bladder cancer status post TURBT, IDA, chronic leukocytosis, history of polio with residual right arm atrophy,  who presents for evaluation of Iron  deficiency anemia   #Chronic Iron  deficiency anemia -improving   Likely cause of recurrent iron  deficiency anemia is GI tract AV malformation (angiectasia)  in the upper GI tract.  No overt bleeding at this time  Patient had underwent small  bowel push enteroscopy with treatment of angiectasia's . ferritin has improved from 10--->28  as well as anemia (Hbg 10--> 12) after treatment of small bowel angiectasia.  Patient has been following with hematology and getting IV iron  infusion and p.o. iron  every other day  2019 patient had a EGD with large gastric AVM malformation status post treatment which was followed up with colonoscopy 2018 (fair prep) and upper endoscopy in 2021 there abnormal bleeding due to AVM was also seen  Continue to  follow up with hematology and at [facility] clinic; depending on response to iron  supplementation may pursue capsule endoscopy +/- colonoscopy in future  All questions were answered.      Taleeyah Bora Faizan Dylann Layne, MD Gastroenterology and Hepatology [facility] Gastroenterology   This chart has been completed using [facility] Dictation software, and while attempts have been made to ensure accuracy , certain words and phrases may not be transcribed as intended

## 2023-12-12 ENCOUNTER — Other Ambulatory Visit: Payer: Self-pay | Admitting: Cardiovascular Disease

## 2023-12-17 ENCOUNTER — Inpatient Hospital Stay: Attending: Hematology

## 2023-12-17 VITALS — BP 157/66 | HR 55 | Temp 96.6°F | Resp 18

## 2023-12-17 DIAGNOSIS — D500 Iron deficiency anemia secondary to blood loss (chronic): Secondary | ICD-10-CM

## 2023-12-17 DIAGNOSIS — D509 Iron deficiency anemia, unspecified: Secondary | ICD-10-CM | POA: Insufficient documentation

## 2023-12-17 MED ORDER — SODIUM CHLORIDE 0.9 % IV SOLN: INTRAVENOUS | Status: DC

## 2023-12-17 MED ORDER — SODIUM CHLORIDE 0.9 % IV SOLN
400.0000 mg | Freq: Once | INTRAVENOUS | Status: AC
  Administered 2023-12-17: 400 mg via INTRAVENOUS
  Filled 2023-12-17: qty 400

## 2023-12-17 NOTE — Progress Notes (Signed)
   12/17/23 1500  Spiritual Encounters  Type of Visit Initial  Care provided to: Pt and family  Referral source Other (comment) (Chaplain making rounds)  Reason for visit  (Spiritual Care Education)  OnCall Visit No  Spiritual Framework  Presenting Themes Values and beliefs;Community and relationships  Community/Connection Family;Spiritual leader;Faith community  Patient Stress Factors None identified  Interventions  Spiritual Care Interventions Made Established relationship of care and support;Narrative/life review  Intervention Outcomes  Outcomes Awareness around self/spiritual resourses  Spiritual Care Plan  Spiritual Care Issues Still Outstanding No further spiritual care needs at this time (see row info)   Reason for Visit: Chaplain making rounds on the floor visiting infusion Pts   Description of Visit: Arriving in the room I found [name] in a recliner chair receiving treatment, and [name] was present with him for support.  I introduced myself as the new chaplain for the cancer center and offered a brief education on the role of a chaplain and the support we can offer to our patients, caregivers, and staff.  I began a conversation with them, and they were receptive to talking with me.   [name] is here for iron  infusions not related to cancer and this is Benjamin last treatment, but it is a long one.  I facilitated storytelling with [name] and [name] and they happily shared with me.   As [name] is not expected to return in the near furture there are no further spiritual care interventions planned.   Plan of Care: I will not be planning further contact with pt unless he reaches out.   [name], MDiv   Chaplain, [facility]  [name] [phone]

## 2023-12-17 NOTE — Progress Notes (Signed)
 Patient presents today for iron  infusion.  Patient is in satisfactory condition with no new complaints voiced.  Vital signs are stable.  We will proceed with infusion per provider orders.    Patient took pre-meds at home prior to arrival. Peripheral IV started with good blood return pre and post infusion.  Venofer  400 mg given today per MD orders. Tolerated infusion without adverse affects. Vital signs stable. No complaints at this time. Discharged from clinic ambulatory in stable condition. Alert and oriented x 3. F/U with [facility] as scheduled.

## 2023-12-17 NOTE — Patient Instructions (Signed)
 [facility] [place] - [facility] DE[facility]  Discharge Instructions: Thank you for choosing [facility] to provide your oncology and hematology care.  If you have a lab appointment with the Cancer Center - please note that after April 8th, 2024, all labs will be drawn in the cancer center.  You do not have to check in or register with the main entrance as you have in the past but will complete your check-in in the cancer center.  Wear comfortable clothing and clothing appropriate for easy access to any Portacath or PICC line.   We strive to give you quality time with your provider. You may need to reschedule your appointment if you arrive late (15 or more minutes).  Arriving late affects you and other patients whose appointments are after yours.  Also, if you miss three or more appointments without notifying the office, you may be dismissed from the clinic at the provider's discretion.      For prescription refill requests, have your pharmacy contact our office and allow 72 hours for refills to be completed.    Today you received Venofer 400 mg IV iron infusion.    BELOW ARE SYMPTOMS THAT SHOULD BE REPORTED IMMEDIATELY: *FEVER GREATER THAN 100.4 F (38 C) OR HIGHER *CHILLS OR SWEATING *NAUSEA AND VOMITING THAT IS NOT CONTROLLED WITH YOUR NAUSEA MEDICATION *UNUSUAL SHORTNESS OF BREATH *UNUSUAL BRUISING OR BLEEDING *URINARY PROBLEMS (pain or burning when urinating, or frequent urination) *BOWEL PROBLEMS (unusual diarrhea, constipation, pain near the anus) TENDERNESS IN MOUTH AND THROAT WITH OR WITHOUT PRESENCE OF ULCERS (sore throat, sores in mouth, or a toothache) UNUSUAL RASH, SWELLING OR PAIN  UNUSUAL VAGINAL DISCHARGE OR ITCHING   Items with * indicate a potential emergency and should be followed up as soon as possible or go to the Emergency Department if any problems should occur.  Please show the CHEMOTHERAPY ALERT CARD or IMMUNOTHERAPY ALERT CARD  at check-in to the Emergency Department and triage nurse.  Should you have questions after your visit or need to cancel or reschedule your appointment, please contact [facility] [place] - A DEPT OF Eligha Bridegroom [facility] [phone]  and follow the prompts.  Office hours are 8:00 a.m. to 4:30 p.m. Monday - Friday. Please note that voicemails left after 4:00 p.m. may not be returned until the following business day.  We are closed weekends and major holidays. You have access to a nurse at all times for urgent questions. Please call the main number to the clinic [phone] and follow the prompts.  For any non-urgent questions, you may also contact your provider using MyChart. We now offer e-Visits for anyone 37 and older to request care online for non-urgent symptoms. For details visit [email or web address].   Also download the MyChart app! Go to the app store, search "MyChart", open the app, select [place], and log in with your MyChart username and password.

## 2023-12-22 ENCOUNTER — Ambulatory Visit: Admission: EM | Admit: 2023-12-22 | Discharge: 2023-12-22 | Disposition: A | Discharge status: Home / Self Care

## 2023-12-22 ENCOUNTER — Other Ambulatory Visit: Payer: Self-pay

## 2023-12-22 ENCOUNTER — Emergency Department (HOSPITAL_COMMUNITY)

## 2023-12-22 ENCOUNTER — Encounter (HOSPITAL_COMMUNITY): Payer: Self-pay

## 2023-12-22 ENCOUNTER — Emergency Department (HOSPITAL_COMMUNITY)
Admission: EM | Admit: 2023-12-22 | Discharge: 2023-12-22 | Disposition: A | Discharge status: Home / Self Care | Source: Ambulatory Visit | Attending: Emergency Medicine | Admitting: Emergency Medicine

## 2023-12-22 ENCOUNTER — Encounter: Payer: Self-pay | Admitting: Emergency Medicine

## 2023-12-22 DIAGNOSIS — Z7902 Long term (current) use of antithrombotics/antiplatelets: Secondary | ICD-10-CM | POA: Insufficient documentation

## 2023-12-22 DIAGNOSIS — I2510 Atherosclerotic heart disease of native coronary artery without angina pectoris: Secondary | ICD-10-CM | POA: Insufficient documentation

## 2023-12-22 DIAGNOSIS — S91312A Laceration without foreign body, left foot, initial encounter: Secondary | ICD-10-CM

## 2023-12-22 DIAGNOSIS — Y92009 Unspecified place in unspecified non-institutional (private) residence as the place of occurrence of the external cause: Secondary | ICD-10-CM | POA: Insufficient documentation

## 2023-12-22 DIAGNOSIS — W01198A Fall on same level from slipping, tripping and stumbling with subsequent striking against other object, initial encounter: Secondary | ICD-10-CM | POA: Diagnosis not present

## 2023-12-22 DIAGNOSIS — Z7901 Long term (current) use of anticoagulants: Secondary | ICD-10-CM | POA: Diagnosis not present

## 2023-12-22 DIAGNOSIS — Z7982 Long term (current) use of aspirin: Secondary | ICD-10-CM | POA: Insufficient documentation

## 2023-12-22 DIAGNOSIS — S91114A Laceration without foreign body of right lesser toe(s) without damage to nail, initial encounter: Secondary | ICD-10-CM | POA: Insufficient documentation

## 2023-12-22 DIAGNOSIS — S99921A Unspecified injury of right foot, initial encounter: Secondary | ICD-10-CM | POA: Diagnosis not present

## 2023-12-22 LAB — CBC WITH DIFFERENTIAL/PLATELET
Abs Immature Granulocytes: 0.07 K/uL (ref 0.00–0.07)
Basophils Absolute: 0.1 K/uL (ref 0.0–0.1)
Basophils Relative: 1 %
Eosinophils Absolute: 0.7 K/uL — ABNORMAL HIGH (ref 0.0–0.5)
Eosinophils Relative: 7 %
HCT: 44.3 % (ref 39.0–52.0)
Hemoglobin: 13.6 g/dL (ref 13.0–17.0)
Immature Granulocytes: 1 %
Lymphocytes Relative: 16 %
Lymphs Abs: 1.6 K/uL (ref 0.7–4.0)
MCH: 26.7 pg (ref 26.0–34.0)
MCHC: 30.7 g/dL (ref 30.0–36.0)
MCV: 86.9 fL (ref 80.0–100.0)
Monocytes Absolute: 0.9 K/uL (ref 0.1–1.0)
Monocytes Relative: 9 %
Neutro Abs: 7 K/uL (ref 1.7–7.7)
Neutrophils Relative %: 66 %
Platelets: 232 K/uL (ref 150–400)
RBC: 5.1 MIL/uL (ref 4.22–5.81)
RDW: 22.4 % — ABNORMAL HIGH (ref 11.5–15.5)
WBC: 10.3 K/uL (ref 4.0–10.5)
nRBC: 0 % (ref 0.0–0.2)

## 2023-12-22 LAB — BASIC METABOLIC PANEL WITH GFR
Anion gap: 10 (ref 5–15)
BUN: 16 mg/dL (ref 8–23)
CO2: 26 mmol/L (ref 22–32)
Calcium: 9.3 mg/dL (ref 8.9–10.3)
Chloride: 105 mmol/L (ref 98–111)
Creatinine, Ser: 0.59 mg/dL — ABNORMAL LOW (ref 0.61–1.24)
GFR, Estimated: >60 mL/min (ref >60)
Glucose, Bld: 97 mg/dL (ref 70–99)
Potassium: 3.5 mmol/L (ref 3.5–5.1)
Sodium: 141 mmol/L (ref 135–145)

## 2023-12-22 LAB — SAMPLE TO BLOOD BANK

## 2023-12-22 MED ORDER — LIDOCAINE-EPINEPHRINE-TETRACAINE (LET) TOPICAL GEL
3.0000 mL | Freq: Once | TOPICAL | Status: AC
  Administered 2023-12-22: 3 mL via TOPICAL
  Filled 2023-12-22: qty 3

## 2023-12-22 MED ORDER — LIDOCAINE HCL (PF) 1 % IJ SOLN
30.0000 mL | Freq: Once | INTRAMUSCULAR | Status: AC
  Administered 2023-12-22: 30 mL

## 2023-12-22 NOTE — ED Notes (Signed)
 Patient is being discharged from the Urgent Care and sent to the Emergency Department via POV . Per PA, patient is in need of higher level of care due to laceration/uncontrollable bleeding. Patient is aware and verbalizes understanding of plan of care.  Vitals:   12/22/23 1335  BP: 134/72  Pulse: 85  Resp: 20  Temp: 98.2 F (36.8 C)  SpO2: 91%

## 2023-12-22 NOTE — ED Provider Notes (Signed)
 [place] EMERGENCY DEPARTMENT AT [facility] Provider Note   CSN: [id number] Arrival date & time: 12/22/23  1353     Patient presents with: [name] is a 83 y.o. male.  He has history of GERD, coronary artery aneurysm, claudication, anemia, coronary artery disease.  He presents to the ER for evaluation of bleeding from the right foot, he states he was walking with his cane and slipped on a rug and had pain in his foot, he noticed after a while that he was bleeding from his foot.  It is not bleeding for several hours so he went to urgent care who sent him here due to continued bleeding.  Denies dizziness or syncope, did not fall to the ground or hit his head, no other pain.  He reports he takes Plavix  and aspirin  but denies any other anticoagulant use.    Fall       Prior to Admission medications   Medication Sig Start Date End Date Taking? Authorizing Provider  aspirin  EC 81 MG tablet Take 81 mg by mouth daily.    [provider]  atorvastatin  (LIPITOR) 40 MG tablet Take 1 tablet by mouth once daily 08/15/23   Nishan, Peter C, MD  clopidogrel  (PLAVIX ) 75 MG tablet Take 1 tablet by mouth once daily 12/13/23   Nishan, Peter C, MD  cyanocobalamin  (VITAMIN B12) 1000 MCG tablet Take 1 tablet (1,000 mcg total) by mouth daily. 12/03/23   Kandala, Hyndavi, MD  ELDERBERRY PO Take 1 tablet by mouth daily.     [provider]  ferrous sulfate  325 (65 FE) MG tablet Take 1 tablet (325 mg total) by mouth 2 (two) times daily with a meal. 08/08/19   Tat, Alm, MD  HYDROcodone -acetaminophen  (NORCO/VICODIN) 5-325 MG tablet Take 1 tablet by mouth every 4 (four) hours as needed. 04/23/23   Yolande Lamar BROCKS, MD  hydrOXYzine (ATARAX) 10 MG tablet hydroxyzine HCl 10 mg tablet  Take 1 tablet every 8 hours by oral route as needed.  for severe itching    [provider]  Iron -FA-B Cmp-C-Biot-Probiotic (FUSION PLUS) CAPS Take 1 capsule by mouth daily. 09/14/23    [provider]  levocetirizine (XYZAL) 5 MG tablet Take 5 mg by mouth daily as needed for allergies.    [provider]  nitroGLYCERIN  (NITROSTAT ) 0.4 MG SL tablet Place 1 tablet (0.4 mg total) under the tongue every 5 (five) minutes x 3 doses as needed for chest pain. 04/17/22   Delford Maude BROCKS, MD  pantoprazole  (PROTONIX ) 40 MG tablet Take 1 tablet (40 mg total) by mouth daily. Take 30 min before meals 11/01/23 04/29/24  Ahmed, Deatrice FALCON, MD    Allergies: Tramadol     Review of Systems  Updated Vital Signs BP (!) 152/86 (BP Location: Right Arm)   Pulse 92   Temp 98.2 F (36.8 C) (Oral)   Resp 15   Ht 5' 6 (1.676 m)   Wt 91.6 kg   SpO2 99%   BMI 32.60 kg/m   Physical Exam  (all labs ordered are listed, but only abnormal results are displayed) Labs Reviewed  CBC WITH DIFFERENTIAL/PLATELET - Abnormal; Notable for the following components:      Result Value   RDW 22.4 (*)    Eosinophils Absolute 0.7 (*)    All other components within normal limits  BASIC METABOLIC PANEL WITH GFR - Abnormal; Notable for the following components:   Creatinine, Ser 0.59 (*)  All other components within normal limits  SAMPLE TO BLOOD BANK    EKG: None  Radiology: DG Foot Complete Right Result Date: 12/22/2023 CLINICAL DATA:  4th toe injury, foot injury EXAM: RIGHT FOOT COMPLETE - 3+ VIEW COMPARISON:  None Available. FINDINGS: Limited examination due to patient positioning. No defininite fractures identified. Joint spaces are preserved. No radiodense foreign bodies. Apparent lucencies in the third digit middle phalax likely related to overlapping soft tissue shadows IMPRESSION: Limited examination due to patient positioning. No definite acute fractures identified. Electronically Signed   By: Michaeline Blanch M.D.   On: 12/22/2023 15:24     .Laceration Repair  Date/Time: 12/22/2023 5:49 PM  Performed by: Suellen Sherran LABOR, PA-C Authorized by: Suellen Sherran LABOR, PA-C    Consent:    Consent obtained:  Verbal   Consent given by:  Patient   Risks discussed:  Pain, infection and poor wound healing Universal protocol:    Imaging studies available: yes     Patient identity confirmed:  Verbally with patient Anesthesia:    Anesthesia method:  Topical application and local infiltration   Topical anesthetic:  LET   Local anesthetic:  Lidocaine  1% w/o epi Laceration details:    Location:  Toe   Toe location:  R fourth toe   Length (cm):  2 Pre-procedure details:    Preparation:  Imaging obtained to evaluate for foreign bodies Exploration:    Limited defect created (wound extended): no     Hemostasis achieved with:  LET and direct pressure   Imaging obtained: x-ray     Imaging outcome: foreign body not noted     Wound exploration: wound explored through full range of motion and entire depth of wound visualized     Wound extent: fascia not violated, no foreign body, no signs of injury and no underlying fracture   Treatment:    Area cleansed with:  Povidone-iodine and Shur-Clens   Amount of cleaning:  Standard   Irrigation solution:  Sterile saline   Irrigation method:  Syringe   Debridement:  None Skin repair:    Repair method:  Sutures   Suture size:  4-0   Suture material:  Nylon   Suture technique:  Simple interrupted Approximation:    Approximation:  Close Repair type:    Repair type:  Simple Post-procedure details:    Dressing:  Antibiotic ointment and bulky dressing Comments:     Pared by PA student Sarah under my direct supervision    Medications Ordered in the ED  lidocaine -EPINEPHrine -tetracaine  (LET) topical gel (3 mLs Topical Given 12/22/23 1508)  lidocaine  (PF) (XYLOCAINE ) 1 % injection 30 mL (30 mLs Infiltration Given 12/22/23 1718)                                    Medical Decision Making Differential diagnose includes but not limited to laceration, contusion, fracture, blood loss anemia, other  ED course: Patient was  brought in for evaluation of bleeding with into his foot.  Family is very worried as they state they felt he had lost a lot of blood, patient is not feeling dizzy and has normal vital signs.  He is on Plavix  but no other anticoagulants.  Ordered labs to evaluate for possible blood loss and these were reassuring, his hemoglobin is 13.6.  Initially it was reported that he had 2 toes that were injured but on wound evaluation after we were able  to control the bleeding, he has 1 laceration on the plantar aspect of the right fourth toe.  This was repaired as above, patient tolerated this well, initial plan was for a postop shoe but family was worried that this will make him have difficulty with walking so we put a bulky bandage on advised to avoid bending the toes and follow-up for wound check in a couple days with PCP and given strict return precautions.  Patient is not diabetic.  Amount and/or Complexity of Data Reviewed Labs: ordered. Radiology: ordered.  Risk Prescription drug management.        Final diagnoses:  Laceration of lesser toe of right foot without foreign body present or damage to nail, initial encounter    ED Discharge Orders     None          Suellen Sherran DELENA DEVONNA 12/22/23 GUILLERMO    Cleotilde Rogue, MD 12/24/23 2232

## 2023-12-22 NOTE — Discharge Instructions (Addendum)
 It was a pleasure taking care of you today.  You were seen for a bleeding cut on your toe.  Fortunately your blood work was all normal, your hemoglobin specifically was 13.6, there is no sign that you lost a significant amount of blood.  We have cleaned and sutured your cut.  Keep it clean and dry, avoid bending the toes until the sutures of come out to prevent them pulling through the skin.  Should have a wound check with your PCP in 2 to 3 days, and have the sutures removed in approximately 1 week.  You can take over-the-counter Tylenol  or ibuprofen  as needed for any discomfort.

## 2023-12-22 NOTE — ED Triage Notes (Addendum)
 Pt reports stepped/fell on something while at home. Denies hitting or loc. Pt noted to have laceration to fourth and fifth toe on right foot. Bleeding not controlled. Pt is on blood thinner.   Provider aware and at bedside.

## 2023-12-22 NOTE — ED Notes (Signed)
 Site cleaned with Hibiclense and sterile water . Site remains actively bleeding.  Provider at bedside discussing care plan with family. Verbal order for pressure dressing to be applied.  Nonadherent pad, reinforced with gauze, secured with coban. Fall sock placed over toes on right foot.   Extra large pads and dressing supplies provided to pt family to assist as needed en route to ED.

## 2023-12-22 NOTE — Discharge Instructions (Signed)
 Given uncontrolled bleeding and extent of wound, recommend further evaluation and management in the emergency department setting.

## 2023-12-22 NOTE — ED Provider Notes (Signed)
 [facility]    CSN: [id number] Arrival date & time: 12/22/23  1316      History   Chief Complaint Chief Complaint  Patient presents with   Laceration    HPI [name] is a 83 y.o. male.   Patient presenting today with a deep laceration and uncontrolled bleeding to the left foot below the left 4th and 5th toes that occurred earlier today when he tripped on a rug and fell at home.  He states he has no idea what may have cut him and was unaware that he was bleeding for some minutes.  Denies head injury, loss of consciousness, loss of range of motion, numbness or tingling per patient.  Tried placing pressure on the area with no slowing of the bleeding.  Does take Plavix .    Past Medical History:  Diagnosis Date   AAA (abdominal aortic aneurysm) (HCC)    Bladder cancer (HCC)    s/p TURBT 09/26/19   Blood transfusion without reported diagnosis    Coronary artery disease    stents placed   GERD (gastroesophageal reflux disease)    HOH (hard of hearing)    Hyperlipidemia    Hypertension    Iron  deficiency anemia 05/14/2018   Polio    hx of. right arm atrophy   Sleep apnea     Patient Active Problem List   Diagnosis Date Noted   Vitamin B12 deficiency 12/03/2023   Angiectasia of gastrointestinal tract 10/10/2023   Post-polio muscle weakness    Acute on chronic respiratory failure with hypoxia and hypercapnia (HCC) 05/29/2021   Acute metabolic encephalopathy 05/29/2021   Acute encephalopathy    NSTEMI (non-ST elevated myocardial infarction) (HCC) 05/19/2021   Acute respiratory failure with hypoxia (HCC) 05/19/2021   GERD (gastroesophageal reflux disease)    HOH (hard of hearing)    Elevated liver enzymes    Abdominal aortic aneurysm (AAA) 10/14/2019   Abdominal aortic aneurysm (AAA) greater than 5.5 cm in diameter in male 10/14/2019   Hematuria 09/06/2019   Bladder mass 09/06/2019   AAA (abdominal aortic aneurysm) 09/06/2019   S/P arterial stent  09/06/2019   Acute blood loss anemia 08/08/2019   Iron  deficiency anemia due to chronic blood loss 08/08/2019   Thrombocytosis 08/08/2019   Symptomatic anemia 08/07/2019   Coronary artery disease 08/07/2019   Absolute anemia 05/15/2018   Guaiac positive stools 05/15/2018   IDA (iron  deficiency anemia) 05/14/2018   Leukocytosis 10/09/2017   Murmur 05/15/2014   Polio 05/15/2014   HTN (hypertension) 11/23/2010   CLAUDICATION 05/19/2009   Elevated lipids 09/17/2008   CORONARY ARTERY ANEURYSM 09/17/2008    Past Surgical History:  Procedure Laterality Date   ABDOMINAL AORTIC ANEURYSM REPAIR N/A 10/14/2019   Procedure: OPEN ABDOMINAL AORTIC ANEURYSM REPAIR USING HEMASHIELD GOLD 16mm x 30cm GRAFT;  Surgeon: [name], MD;  Location: [facility] OR;  Service: Vascular;  Laterality: N/A;   CARDIAC CATHETERIZATION  07/09/2007   x 3 stents   CHOLECYSTECTOMY     COLONOSCOPY WITH PROPOFOL  N/A 05/31/2018   Procedure: COLONOSCOPY WITH PROPOFOL ;  Surgeon: [name], MD;  Location: [facility];  Service: Endoscopy;  Laterality: N/A;  1:45   CORONARY STENT PLACEMENT     x3 vessels bifurcated single vessel   CYSTOSCOPY W/ RETROGRADES Bilateral 09/26/2019   Procedure: CYSTOSCOPY WITH RETROGRADE PYELOGRAM;  Surgeon: [name], MD;  Location: [facility];  Service: Urology;  Laterality: Bilateral;   ENTEROSCOPY N/A 11/01/2023   Procedure: ENTEROSCOPY;  Surgeon: [name], MD;  Location: [facility];  Service: Endoscopy;  Laterality: N/A;  10:00AM;ASA 3   ESOPHAGOGASTRODUODENOSCOPY (EGD) WITH PROPOFOL  N/A 05/31/2018   Procedure: ESOPHAGOGASTRODUODENOSCOPY (EGD) WITH PROPOFOL ;  Surgeon: [name], MD;  Location: [facility];  Service: Endoscopy;  Laterality: N/A;   ESOPHAGOGASTRODUODENOSCOPY (EGD) WITH PROPOFOL  N/A 08/08/2019   Procedure: ESOPHAGOGASTRODUODENOSCOPY (EGD) WITH PROPOFOL ;  Surgeon: [name], MD;  Location: [facility];  Service: Endoscopy;   Laterality: N/A;   KNEE SURGERY     Left knee  steel rod   LEFT HEART CATH AND CORONARY ANGIOGRAPHY N/A 05/20/2021   Procedure: LEFT HEART CATH AND CORONARY ANGIOGRAPHY;  Surgeon: [name], MD;  Location: [facility] INVASIVE CV LAB;  Service: Cardiovascular;  Laterality: N/A;   POLYPECTOMY  05/31/2018   Procedure: POLYPECTOMY;  Surgeon: [name], MD;  Location: [facility];  Service: Endoscopy;;  sigmoid   TRANSURETHRAL RESECTION OF BLADDER TUMOR N/A 09/26/2019   Procedure: TRANSURETHRAL RESECTION OF BLADDER TUMOR (TURBT);  Surgeon: [name], MD;  Location: [facility];  Service: Urology;  Laterality: N/A;  1 HR       Home Medications    Prior to Admission medications   Medication Sig Start Date End Date Taking? Authorizing Provider  aspirin  EC 81 MG tablet Take 81 mg by mouth daily.    [provider]  atorvastatin  (LIPITOR) 40 MG tablet Take 1 tablet by mouth once daily 08/15/23   Nishan, Peter C, MD  clopidogrel  (PLAVIX ) 75 MG tablet Take 1 tablet by mouth once daily 12/13/23   Nishan, Peter C, MD  cyanocobalamin  (VITAMIN B12) 1000 MCG tablet Take 1 tablet (1,000 mcg total) by mouth daily. 12/03/23   Kandala, Hyndavi, MD  ELDERBERRY PO Take 1 tablet by mouth daily.     [provider]  ferrous sulfate  325 (65 FE) MG tablet Take 1 tablet (325 mg total) by mouth 2 (two) times daily with a meal. 08/08/19   Tat, Alm, MD  HYDROcodone -acetaminophen  (NORCO/VICODIN) 5-325 MG tablet Take 1 tablet by mouth every 4 (four) hours as needed. 04/23/23   Yolande Lamar BROCKS, MD  hydrOXYzine (ATARAX) 10 MG tablet hydroxyzine HCl 10 mg tablet  Take 1 tablet every 8 hours by oral route as needed.  for severe itching    [provider]  Iron -FA-B Cmp-C-Biot-Probiotic (FUSION PLUS) CAPS Take 1 capsule by mouth daily. 09/14/23   [provider]  levocetirizine (XYZAL) 5 MG tablet Take 5 mg by mouth daily as needed for allergies.    [provider]   nitroGLYCERIN  (NITROSTAT ) 0.4 MG SL tablet Place 1 tablet (0.4 mg total) under the tongue every 5 (five) minutes x 3 doses as needed for chest pain. 04/17/22   Nishan, Peter C, MD  pantoprazole  (PROTONIX ) 40 MG tablet Take 1 tablet (40 mg total) by mouth daily. Take 30 min before meals 11/01/23 04/29/24  Ahmed, Deatrice FALCON, MD    Family History Family History  Problem Relation Age of Onset   Heart attack Brother 43       died   Hypertension Mother    Heart attack Father     Social History Social History   Tobacco Use   Smoking status: Former    Current packs/day: 0.00    Average packs/day: 1 pack/day for 15.0 years (15.0 ttl pk-yrs)    Types: Cigarettes    Start date: 06/12/1989    Quit date: 06/12/2004    Years since  quitting: 19.5   Smokeless tobacco: Never  Vaping Use   Vaping status: Never Used  Substance Use Topics   Alcohol use: No    Alcohol/week: 0.0 standard drinks of alcohol   Drug use: No     Allergies   Tramadol    Review of Systems Review of Systems Per HPI  Physical Exam Triage Vital Signs ED Triage Vitals  Encounter Vitals Group     BP      Girls Systolic BP Percentile      Girls Diastolic BP Percentile      Boys Systolic BP Percentile      Boys Diastolic BP Percentile      Pulse      Resp      Temp      Temp src      SpO2      Weight      Height      Head Circumference      Peak Flow      Pain Score      Pain Loc      Pain Education      Exclude from Growth Chart    No data found.  Updated Vital Signs BP 134/72 (BP Location: Left Arm)   Pulse 85   Temp 98.2 F (36.8 C) (Oral)   Resp 20   SpO2 91%   Visual Acuity Right Eye Distance:   Left Eye Distance:   Bilateral Distance:    Right Eye Near:   Left Eye Near:    Bilateral Near:     Physical Exam Vitals and nursing note reviewed.  Constitutional:      Appearance: Normal appearance.  HENT:     Head: Atraumatic.  Eyes:     Extraocular Movements: Extraocular movements  intact.     Conjunctiva/sclera: Conjunctivae normal.  Cardiovascular:     Rate and Rhythm: Normal rate.  Pulmonary:     Effort: Pulmonary effort is normal.  Musculoskeletal:        General: Signs of injury present.     Cervical back: Normal range of motion and neck supple.     Comments: Range of motion of the toes of the left foot at his baseline, slightly less range of motion in the 4th and 5th toes but he states that is typical for him.  Skin:    General: Skin is warm.     Comments: Deep lacerations to the base of the left 4th and 5th toes plantar surface, copious ongoing bleeding despite multiple soaks, washes, pressure dressings applied  Neurological:     General: No focal deficit present.     Mental Status: He is oriented to person, place, and time.     Comments: Left foot neurovascularly intact  Psychiatric:        Mood and Affect: Mood normal.        Thought Content: Thought content normal.        Judgment: Judgment normal.      UC Treatments / Results  Labs (all labs ordered are listed, but only abnormal results are displayed) Labs Reviewed - No data to display  EKG   Radiology DG Foot Complete Right Result Date: 12/22/2023 CLINICAL DATA:  4th toe injury, foot injury EXAM: RIGHT FOOT COMPLETE - 3+ VIEW COMPARISON:  None Available. FINDINGS: Limited examination due to patient positioning. No defininite fractures identified. Joint spaces are preserved. No radiodense foreign bodies. Apparent lucencies in the third digit middle phalax likely related to overlapping soft  tissue shadows IMPRESSION: Limited examination due to patient positioning. No definite acute fractures identified. Electronically Signed   By: Michaeline Blanch M.D.   On: 12/22/2023 15:24    Procedures Procedures (including critical care time)  Medications Ordered in UC Medications - No data to display  Initial Impression / Assessment and Plan / UC Course  I have reviewed the triage vital signs and the  nursing notes.  Pertinent labs & imaging results that were available during my care of the patient were reviewed by me and considered in my medical decision making (see chart for details).     Given depth of wounds, uncontrolled bleeding recommended further management and evaluation in the emergency department setting.  Unfortunately, let gel is on backorder so did not have access to any topicals to help with significant bleeding.  Pressure dressing was placed prior to discharge to the emergency department.  Patient elected to go via private vehicle, hemodynamically stable for this at this time.  Family member with him to drive him.  Final Clinical Impressions(s) / UC Diagnoses   Final diagnoses:  Foot laceration, left, initial encounter  Long term current use of anticoagulant therapy     Discharge Instructions      Given uncontrolled bleeding and extent of wound, recommend further evaluation and management in the emergency department setting.    ED Prescriptions   None    PDMP not reviewed this encounter.   Stuart Vernell Norris, [place] 12/22/23 1553

## 2023-12-22 NOTE — ED Triage Notes (Signed)
 Patient has right side deficit from polio and has a hx of falls and tripped over a rug today and hit right foot and has deep lacerations to bottom base of 4th and 5th digit on right foot.  Bleeding is not controlled patient is on blood thinners Denies hitting head or anything else hurting.  Redressed right foot upon arrival due to saturated dressing.

## 2023-12-30 DIAGNOSIS — J9610 Chronic respiratory failure, unspecified whether with hypoxia or hypercapnia: Secondary | ICD-10-CM | POA: Diagnosis not present

## 2023-12-30 DIAGNOSIS — J449 Chronic obstructive pulmonary disease, unspecified: Secondary | ICD-10-CM | POA: Diagnosis not present

## 2024-01-02 DIAGNOSIS — I714 Abdominal aortic aneurysm, without rupture, unspecified: Secondary | ICD-10-CM | POA: Diagnosis not present

## 2024-01-02 DIAGNOSIS — S91114D Laceration without foreign body of right lesser toe(s) without damage to nail, subsequent encounter: Secondary | ICD-10-CM | POA: Diagnosis not present

## 2024-01-02 DIAGNOSIS — E669 Obesity, unspecified: Secondary | ICD-10-CM | POA: Diagnosis not present

## 2024-01-02 DIAGNOSIS — I10 Essential (primary) hypertension: Secondary | ICD-10-CM | POA: Diagnosis not present

## 2024-01-02 DIAGNOSIS — K219 Gastro-esophageal reflux disease without esophagitis: Secondary | ICD-10-CM | POA: Diagnosis not present

## 2024-01-02 DIAGNOSIS — I2510 Atherosclerotic heart disease of native coronary artery without angina pectoris: Secondary | ICD-10-CM | POA: Diagnosis not present

## 2024-01-02 DIAGNOSIS — I350 Nonrheumatic aortic (valve) stenosis: Secondary | ICD-10-CM | POA: Diagnosis not present

## 2024-01-02 DIAGNOSIS — J9622 Acute and chronic respiratory failure with hypercapnia: Secondary | ICD-10-CM | POA: Diagnosis not present

## 2024-01-02 DIAGNOSIS — G14 Postpolio syndrome: Secondary | ICD-10-CM | POA: Diagnosis not present

## 2024-01-02 DIAGNOSIS — J9621 Acute and chronic respiratory failure with hypoxia: Secondary | ICD-10-CM | POA: Diagnosis not present

## 2024-01-02 DIAGNOSIS — J9611 Chronic respiratory failure with hypoxia: Secondary | ICD-10-CM | POA: Diagnosis not present

## 2024-01-02 DIAGNOSIS — Z9181 History of falling: Secondary | ICD-10-CM | POA: Diagnosis not present

## 2024-01-02 DIAGNOSIS — D509 Iron deficiency anemia, unspecified: Secondary | ICD-10-CM | POA: Diagnosis not present

## 2024-01-15 DIAGNOSIS — Z87891 Personal history of nicotine dependence: Secondary | ICD-10-CM | POA: Diagnosis not present

## 2024-01-15 DIAGNOSIS — I25119 Atherosclerotic heart disease of native coronary artery with unspecified angina pectoris: Secondary | ICD-10-CM | POA: Diagnosis not present

## 2024-01-15 DIAGNOSIS — I10 Essential (primary) hypertension: Secondary | ICD-10-CM | POA: Diagnosis not present

## 2024-01-15 DIAGNOSIS — E785 Hyperlipidemia, unspecified: Secondary | ICD-10-CM | POA: Diagnosis not present

## 2024-01-15 DIAGNOSIS — G14 Postpolio syndrome: Secondary | ICD-10-CM | POA: Diagnosis not present

## 2024-01-15 DIAGNOSIS — Z8249 Family history of ischemic heart disease and other diseases of the circulatory system: Secondary | ICD-10-CM | POA: Diagnosis not present

## 2024-01-15 DIAGNOSIS — M8960 Osteopathy after poliomyelitis, unspecified site: Secondary | ICD-10-CM | POA: Diagnosis not present

## 2024-01-15 DIAGNOSIS — M1990 Unspecified osteoarthritis, unspecified site: Secondary | ICD-10-CM | POA: Diagnosis not present

## 2024-01-15 DIAGNOSIS — G4733 Obstructive sleep apnea (adult) (pediatric): Secondary | ICD-10-CM | POA: Diagnosis not present

## 2024-01-15 DIAGNOSIS — Z7982 Long term (current) use of aspirin: Secondary | ICD-10-CM | POA: Diagnosis not present

## 2024-01-15 DIAGNOSIS — E669 Obesity, unspecified: Secondary | ICD-10-CM | POA: Diagnosis not present

## 2024-01-15 DIAGNOSIS — J9610 Chronic respiratory failure, unspecified whether with hypoxia or hypercapnia: Secondary | ICD-10-CM | POA: Diagnosis not present

## 2024-01-30 DIAGNOSIS — J449 Chronic obstructive pulmonary disease, unspecified: Secondary | ICD-10-CM | POA: Diagnosis not present

## 2024-01-30 DIAGNOSIS — J9610 Chronic respiratory failure, unspecified whether with hypoxia or hypercapnia: Secondary | ICD-10-CM | POA: Diagnosis not present

## 2024-02-02 DIAGNOSIS — J9622 Acute and chronic respiratory failure with hypercapnia: Secondary | ICD-10-CM | POA: Diagnosis not present

## 2024-02-02 DIAGNOSIS — J9621 Acute and chronic respiratory failure with hypoxia: Secondary | ICD-10-CM | POA: Diagnosis not present

## 2024-02-02 DIAGNOSIS — I714 Abdominal aortic aneurysm, without rupture, unspecified: Secondary | ICD-10-CM | POA: Diagnosis not present

## 2024-02-26 ENCOUNTER — Inpatient Hospital Stay: Attending: Hematology

## 2024-02-26 ENCOUNTER — Other Ambulatory Visit

## 2024-02-26 DIAGNOSIS — D500 Iron deficiency anemia secondary to blood loss (chronic): Secondary | ICD-10-CM

## 2024-02-26 DIAGNOSIS — E538 Deficiency of other specified B group vitamins: Secondary | ICD-10-CM | POA: Insufficient documentation

## 2024-02-26 DIAGNOSIS — D509 Iron deficiency anemia, unspecified: Secondary | ICD-10-CM | POA: Insufficient documentation

## 2024-02-26 LAB — CBC WITH DIFFERENTIAL/PLATELET
Abs Immature Granulocytes: 0.06 K/uL (ref 0.00–0.07)
Basophils Absolute: 0.1 K/uL (ref 0.0–0.1)
Basophils Relative: 1 %
Eosinophils Absolute: 1.6 K/uL — ABNORMAL HIGH (ref 0.0–0.5)
Eosinophils Relative: 16 %
HCT: 46.7 % (ref 39.0–52.0)
Hemoglobin: 14.8 g/dL (ref 13.0–17.0)
Immature Granulocytes: 1 %
Lymphocytes Relative: 32 %
Lymphs Abs: 3.3 K/uL (ref 0.7–4.0)
MCH: 29.1 pg (ref 26.0–34.0)
MCHC: 31.7 g/dL (ref 30.0–36.0)
MCV: 91.9 fL (ref 80.0–100.0)
Monocytes Absolute: 0.7 K/uL (ref 0.1–1.0)
Monocytes Relative: 7 %
Neutro Abs: 4.6 K/uL (ref 1.7–7.7)
Neutrophils Relative %: 43 %
Platelets: 236 K/uL (ref 150–400)
RBC: 5.08 MIL/uL (ref 4.22–5.81)
RDW: 15.8 % — ABNORMAL HIGH (ref 11.5–15.5)
WBC: 10.4 K/uL (ref 4.0–10.5)
nRBC: 0 % (ref 0.0–0.2)

## 2024-02-26 LAB — COMPREHENSIVE METABOLIC PANEL WITH GFR
ALT: 24 U/L (ref 0–44)
AST: 24 U/L (ref 15–41)
Albumin: 4.2 g/dL (ref 3.5–5.0)
Alkaline Phosphatase: 59 U/L (ref 38–126)
Anion gap: 12 (ref 5–15)
BUN: 16 mg/dL (ref 8–23)
CO2: 28 mmol/L (ref 22–32)
Calcium: 9.4 mg/dL (ref 8.9–10.3)
Chloride: 100 mmol/L (ref 98–111)
Creatinine, Ser: 0.58 mg/dL — ABNORMAL LOW (ref 0.61–1.24)
GFR, Estimated: >60 mL/min (ref >60)
Glucose, Bld: 163 mg/dL — ABNORMAL HIGH (ref 70–99)
Potassium: 3.3 mmol/L — ABNORMAL LOW (ref 3.5–5.1)
Sodium: 140 mmol/L (ref 135–145)
Total Bilirubin: 0.9 mg/dL (ref 0.0–1.2)
Total Protein: 7.9 g/dL (ref 6.5–8.1)

## 2024-02-26 LAB — IRON AND TIBC
Iron: 94 ug/dL (ref 45–182)
Saturation Ratios: 25 % (ref 17.9–39.5)
TIBC: 375 ug/dL (ref 250–450)
UIBC: 281 ug/dL

## 2024-02-26 LAB — VITAMIN B12: Vitamin B-12: 324 pg/mL (ref 180–914)

## 2024-02-26 LAB — FOLATE: Folate: 18.9 ng/mL (ref >5.9)

## 2024-02-26 LAB — FERRITIN: Ferritin: 63 ng/mL (ref 24–336)

## 2024-03-01 DIAGNOSIS — J9610 Chronic respiratory failure, unspecified whether with hypoxia or hypercapnia: Secondary | ICD-10-CM | POA: Diagnosis not present

## 2024-03-01 DIAGNOSIS — J449 Chronic obstructive pulmonary disease, unspecified: Secondary | ICD-10-CM | POA: Diagnosis not present

## 2024-03-03 NOTE — Progress Notes (Unsigned)
 [facility] [address], [place] [address]   CLINIC:  Medical Oncology/Hematology  PCP:  [name], MD [address] [place] [address] [phone]   REASON FOR VISIT:  Follow-up for iron  deficiency anemia  CURRENT THERAPY: Intermittent IV iron  + oral iron   INTERVAL HISTORY:   [name] 83 y.o. male returns for routine follow-up of iron  deficiency anemia. He was last seen by Dr. Davonna on 12/03/2023. He most recently received Venofer  400 mg x 2 in June/July 2025.  At today's visit, he reports feeling fairly well. He felt improved energy after IV iron . He is taking iron  tablet every other day, with some gastric upset. He is also taking vitamin B12 1000 mcg daily. He denies any melena or rectal bleeding. No ice pica, headaches, lightheadedness, syncope, chest pain, dyspnea on exertion. He has 75% energy and 100% appetite. He endorses that he is maintaining a stable weight.  ASSESSMENT & PLAN:  1.  Iron  deficiency anemia, secondary to chronic blood loss - Intermittent anemia since at least 2019, with severe anemia (Hgb 6.1) in February 2021 (large gastric AV malformation treated with APC and clip), s/p PRBC transfusions - He has well-documented history of gastric AVMs - Most recent endoscopy (11/01/2023): Recently bleeding angioectasia x 2 in the stomach.  7 nonbleeding angioectasias in duodenum and jejunum.  Treated with APC. - Follows with gastroenterology (Dr. Cinderella) - Requires intermittent IV iron , most recently Venofer  400 mg x 2 in June/July 2025 - Taking iron  tablet (ferrous sulfate  325 mg) daily - No rectal bleeding or melena - Most recent labs (02/26/2024): Normal Hgb 14.8.  Ferritin 63, iron  saturation 25%. - PLAN: No anemia, iron  levels at goal (ferritin >50, iron  saturation >20%). - No indication for IV iron  at this time. - Continue oral iron  supplement daily. - Continue to watch closely with repeat labs at office visit in 3  months.  2.  Vitamin B12 deficiency - Labs from June 2025 showed mild B12 deficiency with levels <400 (344) - Started on vitamin B12 1000 mcg daily in June 2025 - Most recent labs (02/26/2024) show unchanged vitamin B12 324.  Normal folate. - PLAN: Recommend one-time dose of vitamin B12 1000 mcg injection + continue B12 1000 mcg tablet daily. - Recheck B12/MMA at follow-up.  PLAN SUMMARY: >> Labs in 3 months = CBC/D, BMP, ferritin, iron /TIBC, B12, MMA >> OFFICE visit in 3 months (1 week after labs)     REVIEW OF SYSTEMS:   Review of Systems  Constitutional:  Negative for appetite change, chills, diaphoresis, fatigue, fever and unexpected weight change.  HENT:   Negative for lump/mass and nosebleeds.   Eyes:  Negative for eye problems.  Respiratory:  Negative for cough, hemoptysis and shortness of breath.   Cardiovascular:  Negative for chest pain, leg swelling and palpitations.  Gastrointestinal:  Negative for abdominal pain, blood in stool, constipation, diarrhea, nausea and vomiting.  Genitourinary:  Negative for hematuria.   Skin: Negative.   Neurological:  Positive for headaches (occasional sinus headaches). Negative for dizziness and light-headedness.  Hematological:  Does not bruise/bleed easily.     PHYSICAL EXAM:  ECOG PERFORMANCE STATUS: 0 - Asymptomatic  Vitals:   03/04/24 0901 03/04/24 0905  BP: (!) 176/79 (!) 195/71  Pulse: 80   Resp: 18   Temp: 98.6 F (37 C)   SpO2: 92%    Filed Weights   03/04/24 0901  Weight: 203 lb 7.8 oz (92.3 kg)  Physical Exam Constitutional:      Appearance: Normal appearance. He is normal weight.  Cardiovascular:     Heart sounds: Normal heart sounds.  Pulmonary:     Breath sounds: Normal breath sounds.  Neurological:     General: No focal deficit present.     Mental Status: Mental status is at baseline.  Psychiatric:        Behavior: Behavior normal. Behavior is cooperative.    PAST MEDICAL/SURGICAL HISTORY:  Past  Medical History:  Diagnosis Date   AAA (abdominal aortic aneurysm)    Bladder cancer (HCC)    s/p TURBT 09/26/19   Blood transfusion without reported diagnosis    Coronary artery disease    stents placed   GERD (gastroesophageal reflux disease)    HOH (hard of hearing)    Hyperlipidemia    Hypertension    Iron  deficiency anemia 05/14/2018   Polio    hx of. right arm atrophy   Sleep apnea    Past Surgical History:  Procedure Laterality Date   ABDOMINAL AORTIC ANEURYSM REPAIR N/A 10/14/2019   Procedure: OPEN ABDOMINAL AORTIC ANEURYSM REPAIR USING HEMASHIELD GOLD 16mm x 30cm GRAFT;  Surgeon: [name], MD;  Location: [facility] OR;  Service: Vascular;  Laterality: N/A;   CARDIAC CATHETERIZATION  07/09/2007   x 3 stents   CHOLECYSTECTOMY     COLONOSCOPY WITH PROPOFOL  N/A 05/31/2018   Procedure: COLONOSCOPY WITH PROPOFOL ;  Surgeon: [name], MD;  [facility];  Service: Endoscopy;  Laterality: N/A;  1:45   CORONARY STENT PLACEMENT     x3 vessels bifurcated single vessel   CYSTOSCOPY W/ RETROGRADES Bilateral 09/26/2019   Procedure: CYSTOSCOPY WITH RETROGRADE PYELOGRAM;  Surgeon: [name], MD;  Location: [facility] ORS;  Service: Urology;  Laterality: Bilateral;   ENTEROSCOPY N/A 11/01/2023   Procedure: ENTEROSCOPY;  Surgeon: [name], MD;  Location: [facility];  Service: Endoscopy;  Laterality: N/A;  10:00AM;ASA 3   ESOPHAGOGASTRODUODENOSCOPY (EGD) WITH PROPOFOL  N/A 05/31/2018   Procedure: ESOPHAGOGASTRODUODENOSCOPY (EGD) WITH PROPOFOL ;  Surgeon: [name], MD;  [facility];  Service: Endoscopy;  Laterality: N/A;   ESOPHAGOGASTRODUODENOSCOPY (EGD) WITH PROPOFOL  N/A 08/08/2019   Procedure: ESOPHAGOGASTRODUODENOSCOPY (EGD) WITH PROPOFOL ;  Surgeon: [name], MD;  [facility];  Service: Endoscopy;  Laterality: N/A;   KNEE SURGERY     Left knee  steel rod   LEFT HEART CATH AND CORONARY ANGIOGRAPHY N/A 05/20/2021    Procedure: LEFT HEART CATH AND CORONARY ANGIOGRAPHY;  Surgeon: [name], MD;  Location: [facility] INVASIVE CV LAB;  Service: Cardiovascular;  Laterality: N/A;   POLYPECTOMY  05/31/2018   Procedure: POLYPECTOMY;  Surgeon: [name], MD;  [facility];  Service: Endoscopy;;  sigmoid   TRANSURETHRAL RESECTION OF BLADDER TUMOR N/A 09/26/2019   Procedure: TRANSURETHRAL RESECTION OF BLADDER TUMOR (TURBT);  Surgeon: [name], MD;  Location: [facility] OR[facility];  Service: Urology;  Laterality: N/A;  1 HR    SOCIAL HISTORY:  Social History   Socioeconomic History   Marital status: Married    Spouse name: Not on file   Number of children: 2   Years of education: Not on file   Highest education level: Not on file  Occupational History   Occupation: retired Chartered certified accountant  Tobacco Use   Smoking status: Former    Current packs/day: 0.00    Average packs/day: 1 pack/day for 15.0 years (15.0 ttl pk-yrs)    Types: Cigarettes  Start date: 06/12/1989    Quit date: 06/12/2004    Years since quitting: 19.7   Smokeless tobacco: Never  Vaping Use   Vaping status: Never Used  Substance and Sexual Activity   Alcohol use: No    Alcohol/week: 0.0 standard drinks of alcohol   Drug use: No   Sexual activity: Not Currently    Birth control/protection: None  Other Topics Concern   Not on file  Social History Narrative   ** Merged History Encounter **       Social Drivers of Health   Financial Resource Strain: Not on file  Food Insecurity: No Food Insecurity (10/18/2023)   Hunger Vital Sign    Worried About Running Out of Food in the Last Year: Never true    Ran Out of Food in the Last Year: Never true  Transportation Needs: No Transportation Needs (10/18/2023)   PRAPARE - Administrator, Civil Service (Medical): No    Lack of Transportation (Non-Medical): No  Physical Activity: Not on file  Stress: Not on file  Social Connections: Not on file  Intimate Partner Violence: Not At  Risk (10/18/2023)   Humiliation, Afraid, Rape, and Kick questionnaire    Fear of Current or Ex-Partner: No    Emotionally Abused: No    Physically Abused: No    Sexually Abused: No    FAMILY HISTORY:  Family History  Problem Relation Age of Onset   Heart attack Brother 56       died   Hypertension Mother    Heart attack Father     CURRENT MEDICATIONS:  Outpatient Encounter Medications as of 03/04/2024  Medication Sig   aspirin  EC 81 MG tablet Take 81 mg by mouth daily.   atorvastatin  (LIPITOR) 40 MG tablet Take 1 tablet by mouth once daily   clopidogrel  (PLAVIX ) 75 MG tablet Take 1 tablet by mouth once daily   cyanocobalamin  (VITAMIN B12) 1000 MCG tablet Take 1 tablet (1,000 mcg total) by mouth daily.   ELDERBERRY PO Take 1 tablet by mouth daily.    ferrous sulfate  325 (65 FE) MG tablet Take 1 tablet (325 mg total) by mouth 2 (two) times daily with a meal.   HYDROcodone -acetaminophen  (NORCO/VICODIN) 5-325 MG tablet Take 1 tablet by mouth every 4 (four) hours as needed.   hydrOXYzine (ATARAX) 10 MG tablet hydroxyzine HCl 10 mg tablet  Take 1 tablet every 8 hours by oral route as needed.  for severe itching   Iron -FA-B Cmp-C-Biot-Probiotic (FUSION PLUS) CAPS Take 1 capsule by mouth daily.   levocetirizine (XYZAL) 5 MG tablet Take 5 mg by mouth daily as needed for allergies.   nitroGLYCERIN  (NITROSTAT ) 0.4 MG SL tablet Place 1 tablet (0.4 mg total) under the tongue every 5 (five) minutes x 3 doses as needed for chest pain.   pantoprazole  (PROTONIX ) 40 MG tablet Take 1 tablet (40 mg total) by mouth daily. Take 30 min before meals   No facility-administered encounter medications on file as of 03/04/2024.    ALLERGIES:  Allergies  Allergen Reactions   Tramadol  Other (See Comments)    Excessive sleepiness/difficulty waking patient    LABORATORY DATA:  I have reviewed the labs as listed.  CBC    Component Value Date/Time   WBC 10.4 02/26/2024 1028   RBC 5.08 02/26/2024 1028    HGB 14.8 02/26/2024 1028   HCT 46.7 02/26/2024 1028   PLT 236 02/26/2024 1028   MCV 91.9 02/26/2024 1028   MCH 29.1  02/26/2024 1028   MCHC 31.7 02/26/2024 1028   RDW 15.8 (H) 02/26/2024 1028   LYMPHSABS 3.3 02/26/2024 1028   MONOABS 0.7 02/26/2024 1028   EOSABS 1.6 (H) 02/26/2024 1028   BASOSABS 0.1 02/26/2024 1028      Latest Ref Rng & Units 02/26/2024   10:28 AM 12/22/2023    4:12 PM 11/26/2023   10:14 AM  CMP  Glucose 70 - 99 mg/dL 836  97  863   BUN 8 - 23 mg/dL 16  16  17    Creatinine 0.61 - 1.24 mg/dL 9.41  9.40  9.50   Sodium 135 - 145 mmol/L 140  141  141   Potassium 3.5 - 5.1 mmol/L 3.3  3.5  3.5   Chloride 98 - 111 mmol/L 100  105  103   CO2 22 - 32 mmol/L 28  26  27    Calcium  8.9 - 10.3 mg/dL 9.4  9.3  9.6   Total Protein 6.5 - 8.1 g/dL 7.9   7.4   Total Bilirubin 0.0 - 1.2 mg/dL 0.9   0.6   Alkaline Phos 38 - 126 U/L 59   53   AST 15 - 41 U/L 24   24   ALT 0 - 44 U/L 24   24     DIAGNOSTIC IMAGING:  I have independently reviewed the relevant imaging and discussed with the patient.   WRAP UP:  All questions were answered. The patient knows to call the clinic with any problems, questions or concerns.  Medical decision making: Moderate  Time spent on visit: I spent 20 minutes counseling the patient face to face. The total time spent in the appointment was 30 minutes and more than 50% was on counseling.  Pleasant CHRISTELLA Barefoot, PA-C  03/04/24 9:45 AM

## 2024-03-04 ENCOUNTER — Ambulatory Visit: Admitting: Oncology

## 2024-03-04 ENCOUNTER — Inpatient Hospital Stay: Admitting: Physician Assistant

## 2024-03-04 ENCOUNTER — Inpatient Hospital Stay

## 2024-03-04 VITALS — BP 144/77

## 2024-03-04 VITALS — BP 195/71 | HR 80 | Temp 98.6°F | Resp 18 | Wt 203.5 lb

## 2024-03-04 DIAGNOSIS — E538 Deficiency of other specified B group vitamins: Secondary | ICD-10-CM | POA: Diagnosis not present

## 2024-03-04 DIAGNOSIS — I714 Abdominal aortic aneurysm, without rupture, unspecified: Secondary | ICD-10-CM | POA: Diagnosis not present

## 2024-03-04 DIAGNOSIS — D500 Iron deficiency anemia secondary to blood loss (chronic): Secondary | ICD-10-CM | POA: Diagnosis not present

## 2024-03-04 DIAGNOSIS — J9621 Acute and chronic respiratory failure with hypoxia: Secondary | ICD-10-CM | POA: Diagnosis not present

## 2024-03-04 DIAGNOSIS — J9622 Acute and chronic respiratory failure with hypercapnia: Secondary | ICD-10-CM | POA: Diagnosis not present

## 2024-03-04 DIAGNOSIS — D509 Iron deficiency anemia, unspecified: Secondary | ICD-10-CM | POA: Diagnosis not present

## 2024-03-04 MED ORDER — CYANOCOBALAMIN 1000 MCG/ML IJ SOLN
1000.0000 ug | Freq: Once | INTRAMUSCULAR | Status: AC
  Administered 2024-03-04: 1000 ug via INTRAMUSCULAR
  Filled 2024-03-04: qty 1

## 2024-03-04 NOTE — Patient Instructions (Signed)
 [facility] [place] - A DEPT OF [facility]  Discharge Instructions: Thank you for choosing [facility] to provide your oncology and hematology care.  If you have a lab appointment with the Cancer Center - please note that after April 8th, 2024, all labs will be drawn in the cancer center.  You do not have to check in or register with the main entrance as you have in the past but will complete your check-in in the cancer center.  Wear comfortable clothing and clothing appropriate for easy access to any Portacath or PICC line.   We strive to give you quality time with your provider. You may need to reschedule your appointment if you arrive late (15 or more minutes).  Arriving late affects you and other patients whose appointments are after yours.  Also, if you miss three or more appointments without notifying the office, you may be dismissed from the clinic at the provider's discretion.      For prescription refill requests, have your pharmacy contact our office and allow 72 hours for refills to be completed.    Today you received the following Vitamin B12 injection.      To help prevent nausea and vomiting after your treatment, we encourage you to take your nausea medication as directed.  BELOW ARE SYMPTOMS THAT SHOULD BE REPORTED IMMEDIATELY: *FEVER GREATER THAN 100.4 F (38 C) OR HIGHER *CHILLS OR SWEATING *NAUSEA AND VOMITING THAT IS NOT CONTROLLED WITH YOUR NAUSEA MEDICATION *UNUSUAL SHORTNESS OF BREATH *UNUSUAL BRUISING OR BLEEDING *URINARY PROBLEMS (pain or burning when urinating, or frequent urination) *BOWEL PROBLEMS (unusual diarrhea, constipation, pain near the anus) TENDERNESS IN MOUTH AND THROAT WITH OR WITHOUT PRESENCE OF ULCERS (sore throat, sores in mouth, or a toothache) UNUSUAL RASH, SWELLING OR PAIN  UNUSUAL VAGINAL DISCHARGE OR ITCHING   Items with * indicate a potential emergency and should be followed up as soon as possible or go  to the Emergency Department if any problems should occur.  Please show the CHEMOTHERAPY ALERT CARD or IMMUNOTHERAPY ALERT CARD at check-in to the Emergency Department and triage nurse.  Should you have questions after your visit or need to cancel or reschedule your appointment, please contact [facility] [place] - A DEPT OF [facility] [phone]  and follow the prompts.  Office hours are 8:00 a.m. to 4:30 p.m. Monday - Friday. Please note that voicemails left after 4:00 p.m. may not be returned until the following business day.  We are closed weekends and major holidays. You have access to a nurse at all times for urgent questions. Please call the main number to the clinic [phone] and follow the prompts.  For any non-urgent questions, you may also contact your provider using MyChart. We now offer e-Visits for anyone 63 and older to request care online for non-urgent symptoms. For details visit [email or web address].   Also download the MyChart app! Go to the app store, search "MyChart", open the app, select [place], and log in with your MyChart username and password.

## 2024-03-04 NOTE — Patient Instructions (Signed)
 [facility] at [facility] **VISIT SUMMARY & IMPORTANT INSTRUCTIONS **   You were seen today by Pleasant Barefoot PA-C for your follow-up visit.    IRON  DEFICIENCY ANEMIA Your blood and iron  levels look much better! You do not need any IV iron  at this time. Continue taking iron  tablet every other day.  Take this with food to decrease stomach upset. You remain at risk for ongoing blood loss and recurrent iron  deficiency anemia, so we will recheck labs and see you for follow-up visit in 3 months.  VITAMIN B12 DEFICIENCY Your vitamin B12 levels remain slightly low. We will give one-time vitamin B12 injection today. Continue taking vitamin B12 1000 mcg tablet daily.  FOLLOW-UP APPOINTMENT: 3 months  ** Thank you for trusting me with your healthcare!  I strive to provide all of my patients with quality care at each visit.  If you receive a survey for this visit, I would be so grateful to you for taking the time to provide feedback.  Thank you in advance!  ~ Niko Jakel                                        Dr. Mickiel Davonna Pleasant Barefoot, PA-C          Delon Hope, NP   - - - - - - - - - - - - - - - - - -    Thank you for choosing [facility] at [facility] to provide your oncology and hematology care.  To afford each patient quality time with our provider, please arrive at least 15 minutes before your scheduled appointment time.   If you have a lab appointment with the Cancer Center please come in thru the Main Entrance and check in at the main information desk.  You need to re-schedule your appointment should you arrive 10 or more minutes late.  We strive to give you quality time with our providers, and arriving late affects you and other patients whose appointments are after yours.  Also, if you no show three or more times for appointments you may be dismissed from the clinic at the providers discretion.     Again, thank you for  choosing [facility].  Our hope is that these requests will decrease the amount of time that you wait before being seen by our physicians.       _____________________________________________________________  Should you have questions after your visit to [facility], please contact our office at [phone] and follow the prompts.  Our office hours are 8:00 a.m. and 4:30 p.m. Monday - Friday.  Please note that voicemails left after 4:00 p.m. may not be returned until the following business day.  We are closed weekends and major holidays.  You do have access to a nurse 24-7, just call the main number to the clinic [phone] and do not press any options, hold on the line and a nurse will answer the phone.    For prescription refill requests, have your pharmacy contact our office and allow 72 hours.

## 2024-03-04 NOTE — Progress Notes (Signed)
 Thurlow A Mollenkopf presents today for injection per the provider's orders.  Vitamin B12 injection administration without incident; injection site WNL; see MAR for injection details.  Patient tolerated procedure well and without incident.  No questions or complaints noted at this time. Patient discharged ambulatory in stable condition with family member.

## 2024-03-14 DIAGNOSIS — E1169 Type 2 diabetes mellitus with other specified complication: Secondary | ICD-10-CM | POA: Diagnosis not present

## 2024-03-14 DIAGNOSIS — D509 Iron deficiency anemia, unspecified: Secondary | ICD-10-CM | POA: Diagnosis not present

## 2024-03-14 DIAGNOSIS — E782 Mixed hyperlipidemia: Secondary | ICD-10-CM | POA: Diagnosis not present

## 2024-03-15 LAB — LAB REPORT - SCANNED
A1c: 6.4
Albumin, Urine POC: 56.5
Creatinine, POC: 40.4 mg/dL
EGFR: 94
Microalb Creat Ratio: 140

## 2024-03-19 ENCOUNTER — Ambulatory Visit (INDEPENDENT_AMBULATORY_CARE_PROVIDER_SITE_OTHER): Admitting: Gastroenterology

## 2024-03-19 ENCOUNTER — Encounter (INDEPENDENT_AMBULATORY_CARE_PROVIDER_SITE_OTHER): Payer: Self-pay | Admitting: Gastroenterology

## 2024-03-19 VITALS — BP 165/72 | HR 75 | Temp 97.8°F | Ht 66.0 in | Wt 206.2 lb

## 2024-03-19 DIAGNOSIS — Z862 Personal history of diseases of the blood and blood-forming organs and certain disorders involving the immune mechanism: Secondary | ICD-10-CM | POA: Diagnosis not present

## 2024-03-19 DIAGNOSIS — K31819 Angiodysplasia of stomach and duodenum without bleeding: Secondary | ICD-10-CM | POA: Diagnosis not present

## 2024-03-19 DIAGNOSIS — D500 Iron deficiency anemia secondary to blood loss (chronic): Secondary | ICD-10-CM

## 2024-03-19 NOTE — Progress Notes (Signed)
 [name] [name] , M.D. Gastroenterology & Hepatology [facility] Gastroenterology [address] [place], [place] [address] Primary Care Physician: [name], MD [address] [name] [address]  Chief Complaint:  Iron  deficiency anemia   History of Present Illness: [name] is a 83 y.o. male with hyperlipidemia, hypertension, AAA, coronary artery disease status post remote LAD stent placement, bladder cancer status post TURBT, IDA, chronic leukocytosis, history of polio with residual right arm atrophy,  who presents for follow up  of Iron  deficiency anemia    Patient had underwent small bowel push enteroscopy with treatment of angiectasia's . ferritin has improved from 10--->28-->63   as well as anemia (Hbg 10--> 12--> 14.8) after treatment of small bowel angiectasia.  Patient has been following with hematology and waasgetting IV iron  infusion and currently p.o. iron  intermittently only. Patient reports that he took iron  tablets orally but is unable to tolerate them because it is  messing up his stomach and also causing diarrhea rather.  Denies taking any NSAIDs.The patient denies having any nausea, vomiting, fever, chills, hematochezia, melena, hematemesis, abdominal distention, abdominal pain, diarrhea, jaundice, pruritus or weight loss.  Patient has chronic IDA where December 2019 and EGD with large gastric AV malformation with stigmata of bleeding treated with APC and clip.   Last EGD: 10/2023  - Two recently bleeding angioectasias in the stomach. Treated with argon plasma coagulation ( APC) . - Five non- bleeding angioectasias in the duodenum. Treated with argon plasma coagulation ( APC) . - Two non- bleeding angioectasias in the jejunum. Treated with argon plasma coagulation ( APC) . - No specimens collected.   2021  - Normal esophagus. - Z- line regular, 39 cm from the incisors. - 2 cm hiatal hernia. - Normal stomach. - Normal  duodenal bulb. - A single non- bleeding angiodysplastic lesion in the duodenum. - No specimens collected.  2019 - Normal esophagus. - Z- line regular, 41 cm from the incisors. - A single bleeding angiodysplastic lesion in the stomach. . Treated with a heater probe but kept bleeding. Clips ( MR conditional) were placed. Treated with argon plasma coagulation ( APC) . - Normal duodenal bulb and second portion of the duodenum. - No specimens collected.  Last labs from 0 08/12/2023 downtrending hemoglobin 10.1 MCV 81 normal liver enzymes A1c 6.2 Last Colonoscopy:2019  - Preparation of the colon was fair. - One small polyp in the distal sigmoid colon, removed with a cold snare. Resected and retrieved. - External hemorrhoids.  Past Medical History: Past Medical History:  Diagnosis Date   AAA (abdominal aortic aneurysm)    Bladder cancer (HCC)    s/p TURBT 09/26/19   Blood transfusion without reported diagnosis    Coronary artery disease    stents placed   GERD (gastroesophageal reflux disease)    HOH (hard of hearing)    Hyperlipidemia    Hypertension    Iron  deficiency anemia 05/14/2018   Polio    hx of. right arm atrophy   Sleep apnea     Past Surgical History: Past Surgical History:  Procedure Laterality Date   ABDOMINAL AORTIC ANEURYSM REPAIR N/A 10/14/2019   Procedure: OPEN ABDOMINAL AORTIC ANEURYSM REPAIR USING HEMASHIELD GOLD 16mm x 30cm GRAFT;  Surgeon: [name], MD;  Location: [facility] OR;  Service: Vascular;  Laterality: N/A;   CARDIAC CATHETERIZATION  07/09/2007   x 3 stents   CHOLECYSTECTOMY     COLONOSCOPY WITH PROPOFOL  N/A 05/31/2018  Procedure: COLONOSCOPY WITH PROPOFOL ;  Surgeon: [name], MD;  [facility];  Service: Endoscopy;  Laterality: N/A;  1:45   CORONARY STENT PLACEMENT     x3 vessels bifurcated single vessel   CYSTOSCOPY W/ RETROGRADES Bilateral 09/26/2019   Procedure: CYSTOSCOPY WITH RETROGRADE PYELOGRAM;  Surgeon: [name], MD;  Location: [facility] ORS;  Service: Urology;  Laterality: Bilateral;   ENTEROSCOPY N/A 11/01/2023   Procedure: ENTEROSCOPY;  Surgeon: [name], MD;  [facility];  Service: Endoscopy;  Laterality: N/A;  10:00AM;ASA 3   ESOPHAGOGASTRODUODENOSCOPY (EGD) WITH PROPOFOL  N/A 05/31/2018   Procedure: ESOPHAGOGASTRODUODENOSCOPY (EGD) WITH PROPOFOL ;  Surgeon: [name], MD;  [facility];  Service: Endoscopy;  Laterality: N/A;   ESOPHAGOGASTRODUODENOSCOPY (EGD) WITH PROPOFOL  N/A 08/08/2019   Procedure: ESOPHAGOGASTRODUODENOSCOPY (EGD) WITH PROPOFOL ;  Surgeon: [name], MD;  [facility];  Service: Endoscopy;  Laterality: N/A;   KNEE SURGERY     Left knee  steel rod   LEFT HEART CATH AND CORONARY ANGIOGRAPHY N/A 05/20/2021   Procedure: LEFT HEART CATH AND CORONARY ANGIOGRAPHY;  Surgeon: [name], MD;  Location: [facility] INVASIVE CV LAB;  Service: Cardiovascular;  Laterality: N/A;   POLYPECTOMY  05/31/2018   Procedure: POLYPECTOMY;  Surgeon: [name], MD;  [facility];  Service: Endoscopy;;  sigmoid   TRANSURETHRAL RESECTION OF BLADDER TUMOR N/A 09/26/2019   Procedure: TRANSURETHRAL RESECTION OF BLADDER TUMOR (TURBT);  Surgeon: [name], MD;  Location: [facility];  Service: Urology;  Laterality: N/A;  1 HR    Family History: Family History  Problem Relation Age of Onset   Heart attack Brother 15       died   Hypertension Mother    Heart attack Father     Social History: Social History   Tobacco Use  Smoking Status Former   Current packs/day: 0.00   Average packs/day: 1 pack/day for 15.0 years (15.0 ttl pk-yrs)   Types: Cigarettes   Start date: 06/12/1989   Quit date: 06/12/2004   Years since quitting: 19.7  Smokeless Tobacco Never   Social History   Substance and Sexual Activity  Alcohol Use No   Alcohol/week: 0.0 standard drinks of alcohol   Social History   Substance and Sexual Activity  Drug Use  No    Allergies: Allergies  Allergen Reactions   Tramadol  Other (See Comments)    Excessive sleepiness/difficulty waking patient    Medications: Current Outpatient Medications  Medication Sig Dispense Refill   aspirin  EC 81 MG tablet Take 81 mg by mouth daily.     atorvastatin  (LIPITOR) 40 MG tablet Take 1 tablet by mouth once daily 90 tablet 2   clopidogrel  (PLAVIX ) 75 MG tablet Take 1 tablet by mouth once daily 90 tablet 3   cyanocobalamin  (VITAMIN B12) 1000 MCG tablet Take 1 tablet (1,000 mcg total) by mouth daily. 90 tablet 2   ELDERBERRY PO Take 1 tablet by mouth daily.      ferrous sulfate  325 (65 FE) MG tablet Take 1 tablet (325 mg total) by mouth 2 (two) times daily with a meal. (Patient taking differently: Take 325 mg by mouth. Patient takes po fe one tablet every two to three days.)  3   HYDROcodone -acetaminophen  (NORCO/VICODIN) 5-325 MG tablet Take 1 tablet by mouth every 4 (four) hours as needed. 12 tablet 0   hydrOXYzine (ATARAX) 10 MG tablet hydroxyzine HCl 10 mg tablet  Take 1 tablet every 8  hours by oral route as needed.  for severe itching     levocetirizine (XYZAL) 5 MG tablet Take 5 mg by mouth daily as needed for allergies.     nitroGLYCERIN  (NITROSTAT ) 0.4 MG SL tablet Place 1 tablet (0.4 mg total) under the tongue every 5 (five) minutes x 3 doses as needed for chest pain. 25 tablet 3   pantoprazole  (PROTONIX ) 40 MG tablet Take 1 tablet (40 mg total) by mouth daily. Take 30 min before meals (Patient taking differently: Take 40 mg by mouth. one tablet every two to three days.)     No current facility-administered medications for this visit.    Review of Systems: GENERAL: negative for malaise, night sweats HEENT: No changes in hearing or vision, no nose bleeds or other nasal problems. NECK: Negative for lumps, goiter, pain and significant neck swelling RESPIRATORY: Negative for cough, wheezing CARDIOVASCULAR: Negative for chest pain, leg swelling, palpitations,  orthopnea GI: SEE HPI MUSCULOSKELETAL: Negative for joint pain or swelling, back pain, and muscle pain. SKIN: Negative for lesions, rash HEMATOLOGY Negative for prolonged bleeding, bruising easily, and swollen nodes. ENDOCRINE: Negative for cold or heat intolerance, polyuria, polydipsia and goiter. NEURO: negative for tremor, gait imbalance, syncope and seizures. The remainder of the review of systems is noncontributory.   Physical Exam: BP (!) 164/98 (BP Location: Left Arm, Patient Position: Sitting, Cuff Size: Large)   Pulse 73   Temp 97.8 F (36.6 C) (Temporal)   Ht 5' 6 (1.676 m)   Wt 206 lb 3.2 oz (93.5 kg)   BMI 33.28 kg/m  GENERAL: The patient is AO x3, in no acute distress. HEENT: Head is normocephalic and atraumatic. EOMI are intact. Mouth is well hydrated and without lesions. NECK: Supple. No masses LUNGS: Clear to auscultation. No presence of rhonchi/wheezing/rales. Adequate chest expansion HEART: RRR, normal s1 and s2. ABDOMEN: Soft, nontender, no guarding, no peritoneal signs, and nondistended. BS +. No masses.  Imaging/Labs: as above     Latest Ref Rng & Units 02/26/2024   10:28 AM 12/22/2023    4:12 PM 11/26/2023   10:14 AM  CBC  WBC 4.0 - 10.5 K/uL 10.4  10.3  9.3   Hemoglobin 13.0 - 17.0 g/dL 85.1  86.3  87.9   Hematocrit 39.0 - 52.0 % 46.7  44.3  41.4   Platelets 150 - 400 K/uL 236  232  297    Lab Results  Component Value Date   IRON  94 02/26/2024   TIBC 375 02/26/2024   FERRITIN 63 02/26/2024    I personally reviewed and interpreted the available labs, imaging and endoscopic files.  Impression and Plan:  [name] is a 83 y.o. male with hyperlipidemia, hypertension, AAA, coronary artery disease status post remote LAD stent placement, bladder cancer status post TURBT, IDA, chronic leukocytosis, history of polio with residual right arm atrophy,  who presents for evaluation of Iron  deficiency anemia   #Chronic Iron  deficiency anemia -resolved    Likely cause of recurrent iron  deficiency anemia is GI tract AV malformation (angiectasia)  in the upper GI tract.  No overt bleeding at this time  Patient had underwent small bowel push enteroscopy with treatment of angiectasia's . ferritin has improved from 10--->28-->63   as well as anemia (Hbg 10--> 12--> 14.8) after treatment of small bowel angiectasia.  Patient has been following with hematology and was getting IV iron  infusion and currently p.o. iron  intermittently only.   2019 patient had a EGD with large gastric AVM  malformation status post treatment which was followed up with colonoscopy 2018 (fair prep) and upper endoscopy in 2021 there abnormal bleeding due to AVM was also seen  If reoccurrence of anemia may pursue capsule endoscopy +/- colonoscopy in future. But for now given anemia and iron  deficiency has completely resolved will monitor   All questions were answered.      Harper Smoker Benjamin Sincerity Cedar, MD Gastroenterology and Hepatology [facility] Gastroenterology   This chart has been completed using [facility] Dictation software, and while attempts have been made to ensure accuracy , certain words and phrases may not be transcribed as intended

## 2024-03-21 DIAGNOSIS — I350 Nonrheumatic aortic (valve) stenosis: Secondary | ICD-10-CM | POA: Diagnosis not present

## 2024-03-21 DIAGNOSIS — G14 Postpolio syndrome: Secondary | ICD-10-CM | POA: Diagnosis not present

## 2024-03-21 DIAGNOSIS — L219 Seborrheic dermatitis, unspecified: Secondary | ICD-10-CM | POA: Diagnosis not present

## 2024-03-21 DIAGNOSIS — E1169 Type 2 diabetes mellitus with other specified complication: Secondary | ICD-10-CM | POA: Diagnosis not present

## 2024-03-21 DIAGNOSIS — J309 Allergic rhinitis, unspecified: Secondary | ICD-10-CM | POA: Diagnosis not present

## 2024-03-21 DIAGNOSIS — D509 Iron deficiency anemia, unspecified: Secondary | ICD-10-CM | POA: Diagnosis not present

## 2024-03-21 DIAGNOSIS — K219 Gastro-esophageal reflux disease without esophagitis: Secondary | ICD-10-CM | POA: Diagnosis not present

## 2024-03-21 DIAGNOSIS — J9611 Chronic respiratory failure with hypoxia: Secondary | ICD-10-CM | POA: Diagnosis not present

## 2024-03-21 DIAGNOSIS — Z0000 Encounter for general adult medical examination without abnormal findings: Secondary | ICD-10-CM | POA: Diagnosis not present

## 2024-03-21 DIAGNOSIS — Z23 Encounter for immunization: Secondary | ICD-10-CM | POA: Diagnosis not present

## 2024-03-21 DIAGNOSIS — Z0001 Encounter for general adult medical examination with abnormal findings: Secondary | ICD-10-CM | POA: Diagnosis not present

## 2024-03-21 DIAGNOSIS — I2510 Atherosclerotic heart disease of native coronary artery without angina pectoris: Secondary | ICD-10-CM | POA: Diagnosis not present

## 2024-03-31 DIAGNOSIS — J449 Chronic obstructive pulmonary disease, unspecified: Secondary | ICD-10-CM | POA: Diagnosis not present

## 2024-03-31 DIAGNOSIS — J9610 Chronic respiratory failure, unspecified whether with hypoxia or hypercapnia: Secondary | ICD-10-CM | POA: Diagnosis not present

## 2024-04-03 DIAGNOSIS — I714 Abdominal aortic aneurysm, without rupture, unspecified: Secondary | ICD-10-CM | POA: Diagnosis not present

## 2024-04-03 DIAGNOSIS — J9622 Acute and chronic respiratory failure with hypercapnia: Secondary | ICD-10-CM | POA: Diagnosis not present

## 2024-04-03 DIAGNOSIS — J9621 Acute and chronic respiratory failure with hypoxia: Secondary | ICD-10-CM | POA: Diagnosis not present

## 2024-04-07 DIAGNOSIS — L218 Other seborrheic dermatitis: Secondary | ICD-10-CM | POA: Diagnosis not present

## 2024-04-07 DIAGNOSIS — L821 Other seborrheic keratosis: Secondary | ICD-10-CM | POA: Diagnosis not present

## 2024-04-07 DIAGNOSIS — B078 Other viral warts: Secondary | ICD-10-CM | POA: Diagnosis not present

## 2024-04-07 DIAGNOSIS — X32XXXD Exposure to sunlight, subsequent encounter: Secondary | ICD-10-CM | POA: Diagnosis not present

## 2024-04-07 DIAGNOSIS — L570 Actinic keratosis: Secondary | ICD-10-CM | POA: Diagnosis not present

## 2024-05-01 DIAGNOSIS — J449 Chronic obstructive pulmonary disease, unspecified: Secondary | ICD-10-CM | POA: Diagnosis not present

## 2024-05-04 DIAGNOSIS — J9622 Acute and chronic respiratory failure with hypercapnia: Secondary | ICD-10-CM | POA: Diagnosis not present

## 2024-05-04 DIAGNOSIS — I714 Abdominal aortic aneurysm, without rupture, unspecified: Secondary | ICD-10-CM | POA: Diagnosis not present

## 2024-05-14 ENCOUNTER — Telehealth: Payer: Self-pay | Admitting: Cardiovascular Disease

## 2024-05-16 NOTE — Telephone Encounter (Signed)
*  STAT* If patient is at the pharmacy, call can be transferred to refill team.   1. Which medications need to be refilled? (please list name of each medication and dose if known)   atorvastatin  (LIPITOR) 40 MG tablet   2. Would you like to learn more about the convenience, safety, & potential cost savings by using the [facility]?   3. Are you open to using the [facility] (Type [facility]. ).  4. Which pharmacy/location (including street and city if local pharmacy) is medication to be sent to?  [id number] - [place], [place] - [address]   5. Do they need a 30 day or 90 day supply?   90 day   Patient stated he only has 4 tablets left.  Patient has appointment scheduled on 09/05/24 with Dr. Nishan.

## 2024-05-16 NOTE — Telephone Encounter (Signed)
 Refill sent

## 2024-05-31 DIAGNOSIS — J449 Chronic obstructive pulmonary disease, unspecified: Secondary | ICD-10-CM | POA: Diagnosis not present

## 2024-05-31 DIAGNOSIS — J9610 Chronic respiratory failure, unspecified whether with hypoxia or hypercapnia: Secondary | ICD-10-CM | POA: Diagnosis not present

## 2024-06-03 ENCOUNTER — Inpatient Hospital Stay: Attending: Hematology

## 2024-06-03 DIAGNOSIS — K31819 Angiodysplasia of stomach and duodenum without bleeding: Secondary | ICD-10-CM | POA: Insufficient documentation

## 2024-06-03 DIAGNOSIS — E538 Deficiency of other specified B group vitamins: Secondary | ICD-10-CM | POA: Insufficient documentation

## 2024-06-03 DIAGNOSIS — J9622 Acute and chronic respiratory failure with hypercapnia: Secondary | ICD-10-CM | POA: Diagnosis not present

## 2024-06-03 DIAGNOSIS — D500 Iron deficiency anemia secondary to blood loss (chronic): Secondary | ICD-10-CM | POA: Diagnosis present

## 2024-06-03 DIAGNOSIS — Z87891 Personal history of nicotine dependence: Secondary | ICD-10-CM | POA: Diagnosis not present

## 2024-06-03 DIAGNOSIS — I714 Abdominal aortic aneurysm, without rupture, unspecified: Secondary | ICD-10-CM | POA: Diagnosis not present

## 2024-06-03 DIAGNOSIS — J9621 Acute and chronic respiratory failure with hypoxia: Secondary | ICD-10-CM | POA: Diagnosis not present

## 2024-06-03 LAB — CBC WITH DIFFERENTIAL/PLATELET
Abs Immature Granulocytes: 0.12 K/uL — ABNORMAL HIGH (ref 0.00–0.07)
Basophils Absolute: 0.1 K/uL (ref 0.0–0.1)
Basophils Relative: 1 %
Eosinophils Absolute: 1.5 K/uL — ABNORMAL HIGH (ref 0.0–0.5)
Eosinophils Relative: 13 %
HCT: 43.3 % (ref 39.0–52.0)
Hemoglobin: 13.8 g/dL (ref 13.0–17.0)
Immature Granulocytes: 1 %
Lymphocytes Relative: 25 %
Lymphs Abs: 2.9 K/uL (ref 0.7–4.0)
MCH: 30.3 pg (ref 26.0–34.0)
MCHC: 31.9 g/dL (ref 30.0–36.0)
MCV: 95 fL (ref 80.0–100.0)
Monocytes Absolute: 0.7 K/uL (ref 0.1–1.0)
Monocytes Relative: 6 %
Neutro Abs: 6.1 K/uL (ref 1.7–7.7)
Neutrophils Relative %: 54 %
Platelets: 284 K/uL (ref 150–400)
RBC: 4.56 MIL/uL (ref 4.22–5.81)
RDW: 13.7 % (ref 11.5–15.5)
WBC: 11.5 K/uL — ABNORMAL HIGH (ref 4.0–10.5)
nRBC: 0 % (ref 0.0–0.2)

## 2024-06-03 LAB — BASIC METABOLIC PANEL WITH GFR
Anion gap: 12 (ref 5–15)
BUN: 14 mg/dL (ref 8–23)
CO2: 28 mmol/L (ref 22–32)
Calcium: 9.5 mg/dL (ref 8.9–10.3)
Chloride: 103 mmol/L (ref 98–111)
Creatinine, Ser: 0.55 mg/dL — ABNORMAL LOW (ref 0.61–1.24)
GFR, Estimated: >60 mL/min
Glucose, Bld: 142 mg/dL — ABNORMAL HIGH (ref 70–99)
Potassium: 3.5 mmol/L (ref 3.5–5.1)
Sodium: 143 mmol/L (ref 135–145)

## 2024-06-03 LAB — IRON AND TIBC
Iron: 90 ug/dL (ref 45–182)
Saturation Ratios: 25 % (ref 17.9–39.5)
TIBC: 353 ug/dL (ref 250–450)
UIBC: 263 ug/dL

## 2024-06-03 LAB — VITAMIN B12: Vitamin B-12: 649 pg/mL (ref 180–914)

## 2024-06-03 LAB — FERRITIN: Ferritin: 97 ng/mL (ref 24–336)

## 2024-06-06 LAB — METHYLMALONIC ACID, SERUM: Methylmalonic Acid, Quantitative: 160 nmol/L (ref 0–378)

## 2024-06-08 NOTE — Progress Notes (Unsigned)
 "  [facility] [address] [place], [place] [address]   CLINIC:  Medical Oncology/Hematology  PCP:  [name], MD [address] [place] [address] [phone]   REASON FOR VISIT:  Follow-up for iron  deficiency anemia  CURRENT THERAPY: Intermittent IV iron  + oral iron   INTERVAL HISTORY:   Mr. Staton 83 y.o. male returns for routine follow-up of iron  deficiency anemia. He was last seen by Pleasant Barefoot PA-C on 03/04/2024. He most recently received Venofer  400 mg x 2 in June/July 2025.  At today's visit, he reports feeling fairly well.*** He has 75***% energy and 100***% appetite.  He endorses that he is maintaining a stable weight.***  He felt improved energy after IV iron .*** He is taking iron  tablet every other day, with some gastric upset.*** He is also taking vitamin B12 1000 mcg daily.*** He denies any melena or rectal bleeding.*** No ice pica, headaches, lightheadedness, syncope, chest pain, dyspnea on exertion.***   ASSESSMENT & PLAN:  1.  Iron  deficiency anemia, secondary to chronic blood loss - Intermittent anemia since at least 2019, with severe anemia (Hgb 6.1) in February 2021 (large gastric AV malformation treated with APC and clip), s/p PRBC transfusions - He has well-documented history of gastric AVMs - Most recent small bowel endoscopy (11/01/2023): Recently bleeding angioectasia x 2 in the stomach.  7 nonbleeding angioectasias in duodenum and jejunum.  Treated with APC. - Follows with gastroenterology (Dr. Cinderella) - Requires intermittent IV iron , most recently Venofer  400 mg x 2 in June/July 2025 - Taking iron  tablet (ferrous sulfate  325 mg) daily*** - No rectal bleeding or melena*** - Most recent labs (06/03/2024): Normal Hgb 13.8.  Ferritin 97, iron  saturation 25%. - PLAN: No anemia, iron  levels at goal (ferritin >50, iron  saturation >20%). - No indication for IV iron  at this time. - Continue oral iron  supplement daily. -  Continue to watch closely with repeat labs at office visit in 6 months, or sooner if needed if any symptoms of recurrent blood loss or anemia.  ***  2.  Vitamin B12 deficiency - Labs from June 2025 showed mild B12 deficiency with levels <400 (344) - Started on vitamin B12 1000 mcg daily in June 2025 - Received one-time vitamin B12 injection on 03/04/2024 - Most recent labs (06/03/2024) show normalized vitamin B12 at 649, normal MMA.  (Prior labs showed normal folate)  - PLAN: Continue B12 1000 mcg tablet daily. - Recheck B12/MMA at follow-up.  PLAN SUMMARY: >> Labs in 6 months = CBC/D, BMP, ferritin, iron /TIBC, B12, MMA >> OFFICE visit in 6 months (1 week after labs) ***    REVIEW OF SYSTEMS: ***  Review of Systems  Constitutional:  Negative for appetite change, chills, diaphoresis, fatigue, fever and unexpected weight change.  HENT:   Negative for lump/mass and nosebleeds.   Eyes:  Negative for eye problems.  Respiratory:  Negative for cough, hemoptysis and shortness of breath.   Cardiovascular:  Negative for chest pain, leg swelling and palpitations.  Gastrointestinal:  Negative for abdominal pain, blood in stool, constipation, diarrhea, nausea and vomiting.  Genitourinary:  Negative for hematuria.   Skin: Negative.   Neurological:  Positive for headaches (occasional sinus headaches). Negative for dizziness and light-headedness.  Hematological:  Does not bruise/bleed easily.     PHYSICAL EXAM:***  ECOG PERFORMANCE STATUS: 0 - Asymptomatic  There were no vitals filed for this visit.  There were no vitals filed for this visit.  Physical Exam Constitutional:  Appearance: Normal appearance. He is normal weight.  Cardiovascular:     Heart sounds: Normal heart sounds.  Pulmonary:     Breath sounds: Normal breath sounds.  Neurological:     General: No focal deficit present.     Mental Status: Mental status is at baseline.  Psychiatric:        Behavior: Behavior normal.  Behavior is cooperative.    PAST MEDICAL/SURGICAL HISTORY:  Past Medical History:  Diagnosis Date   AAA (abdominal aortic aneurysm)    Bladder cancer (HCC)    s/p TURBT 09/26/19   Blood transfusion without reported diagnosis    Coronary artery disease    stents placed   GERD (gastroesophageal reflux disease)    HOH (hard of hearing)    Hyperlipidemia    Hypertension    Iron  deficiency anemia 05/14/2018   Polio    hx of. right arm atrophy   Sleep apnea    Past Surgical History:  Procedure Laterality Date   ABDOMINAL AORTIC ANEURYSM REPAIR N/A 10/14/2019   Procedure: OPEN ABDOMINAL AORTIC ANEURYSM REPAIR USING HEMASHIELD GOLD 16mm x 30cm GRAFT;  Surgeon: [name], MD;  Location: [facility] OR;  Service: Vascular;  Laterality: N/A;   CARDIAC CATHETERIZATION  07/09/2007   x 3 stents   CHOLECYSTECTOMY     COLONOSCOPY WITH PROPOFOL  N/A 05/31/2018   Procedure: COLONOSCOPY WITH PROPOFOL ;  Surgeon: [name], MD;  [facility];  Service: Endoscopy;  Laterality: N/A;  1:45   CORONARY STENT PLACEMENT     x3 vessels bifurcated single vessel   CYSTOSCOPY W/ RETROGRADES Bilateral 09/26/2019   Procedure: CYSTOSCOPY WITH RETROGRADE PYELOGRAM;  Surgeon: [name], MD;  Location: [facility];  Service: Urology;  Laterality: Bilateral;   ENTEROSCOPY N/A 11/01/2023   Procedure: ENTEROSCOPY;  Surgeon: [name], MD;  [facility];  Service: Endoscopy;  Laterality: N/A;  10:00AM;ASA 3   ESOPHAGOGASTRODUODENOSCOPY (EGD) WITH PROPOFOL  N/A 05/31/2018   Procedure: ESOPHAGOGASTRODUODENOSCOPY (EGD) WITH PROPOFOL ;  Surgeon: [name], MD;  [facility];  Service: Endoscopy;  Laterality: N/A;   ESOPHAGOGASTRODUODENOSCOPY (EGD) WITH PROPOFOL  N/A 08/08/2019   Procedure: ESOPHAGOGASTRODUODENOSCOPY (EGD) WITH PROPOFOL ;  Surgeon: [name], MD;  [facility];  Service: Endoscopy;  Laterality: N/A;   KNEE SURGERY     Left knee  steel  rod   LEFT HEART CATH AND CORONARY ANGIOGRAPHY N/A 05/20/2021   Procedure: LEFT HEART CATH AND CORONARY ANGIOGRAPHY;  Surgeon: [name], MD;  Location: [facility] INVASIVE CV LAB;  Service: Cardiovascular;  Laterality: N/A;   POLYPECTOMY  05/31/2018   Procedure: POLYPECTOMY;  Surgeon: [name], MD;  [facility];  Service: Endoscopy;;  sigmoid   TRANSURETHRAL RESECTION OF BLADDER TUMOR N/A 09/26/2019   Procedure: TRANSURETHRAL RESECTION OF BLADDER TUMOR (TURBT);  Surgeon: [name], MD;  Location: [facility];  Service: Urology;  Laterality: N/A;  1 HR    SOCIAL HISTORY:  Social History   Socioeconomic History   Marital status: Married    Spouse name: Not on file   Number of children: 2   Years of education: Not on file   Highest education level: Not on file  Occupational History   Occupation: retired chartered certified accountant  Tobacco Use   Smoking status: Former    Current packs/day: 0.00    Average packs/day: 1 pack/day for 15.0 years (15.0 ttl pk-yrs)    Types: Cigarettes    Start date: 06/12/1989  Quit date: 06/12/2004    Years since quitting: 20.0   Smokeless tobacco: Never  Vaping Use   Vaping status: Never Used  Substance and Sexual Activity   Alcohol use: No    Alcohol/week: 0.0 standard drinks of alcohol   Drug use: No   Sexual activity: Not Currently    Birth control/protection: None  Other Topics Concern   Not on file  Social History Narrative   ** Merged History Encounter **       Social Drivers of Health   Tobacco Use: Medium Risk (03/19/2024)   Patient History    Smoking Tobacco Use: Former    Smokeless Tobacco Use: Never    Passive Exposure: Not on Actuary Strain: Not on file  Food Insecurity: No Food Insecurity (10/18/2023)   Hunger Vital Sign    Worried About Running Out of Food in the Last Year: Never true    Ran Out of Food in the Last Year: Never true  Transportation Needs: No Transportation Needs (10/18/2023)   PRAPARE -  Administrator, Civil Service (Medical): No    Lack of Transportation (Non-Medical): No  Physical Activity: Not on file  Stress: Not on file  Social Connections: Not on file  Intimate Partner Violence: Not At Risk (10/18/2023)   Humiliation, Afraid, Rape, and Kick questionnaire    Fear of Current or Ex-Partner: No    Emotionally Abused: No    Physically Abused: No    Sexually Abused: No  Depression (PHQ2-9): Low Risk (03/04/2024)   Depression (PHQ2-9)    PHQ-2 Score: 0  Alcohol Screen: Not on file  Housing: Low Risk (10/18/2023)   Housing Stability Vital Sign    Unable to Pay for Housing in the Last Year: No    Number of Times Moved in the Last Year: 0    Homeless in the Last Year: No  Utilities: Not At Risk (10/18/2023)   AHC Utilities    Threatened with loss of utilities: No  Health Literacy: Not on file    FAMILY HISTORY:  Family History  Problem Relation Age of Onset   Heart attack Brother 40       died   Hypertension Mother    Heart attack Father     CURRENT MEDICATIONS:  Outpatient Encounter Medications as of 06/09/2024  Medication Sig   aspirin  EC 81 MG tablet Take 81 mg by mouth daily.   atorvastatin  (LIPITOR) 40 MG tablet Take 1 tablet by mouth once daily   clopidogrel  (PLAVIX ) 75 MG tablet Take 1 tablet by mouth once daily   cyanocobalamin  (VITAMIN B12) 1000 MCG tablet Take 1 tablet (1,000 mcg total) by mouth daily.   ELDERBERRY PO Take 1 tablet by mouth daily.    ferrous sulfate  325 (65 FE) MG tablet Take 1 tablet (325 mg total) by mouth 2 (two) times daily with a meal. (Patient taking differently: Take 325 mg by mouth. Patient takes po fe one tablet every two to three days.)   HYDROcodone -acetaminophen  (NORCO/VICODIN) 5-325 MG tablet Take 1 tablet by mouth every 4 (four) hours as needed.   hydrOXYzine (ATARAX) 10 MG tablet hydroxyzine HCl 10 mg tablet  Take 1 tablet every 8 hours by oral route as needed.  for severe itching   levocetirizine (XYZAL) 5  MG tablet Take 5 mg by mouth daily as needed for allergies.   nitroGLYCERIN  (NITROSTAT ) 0.4 MG SL tablet Place 1 tablet (0.4 mg total) under the tongue every 5 (five)  minutes x 3 doses as needed for chest pain.   pantoprazole  (PROTONIX ) 40 MG tablet Take 1 tablet (40 mg total) by mouth daily. Take 30 min before meals (Patient taking differently: Take 40 mg by mouth. one tablet every two to three days.)   No facility-administered encounter medications on file as of 06/09/2024.    ALLERGIES:  Allergies  Allergen Reactions   Tramadol  Other (See Comments)    Excessive sleepiness/difficulty waking patient    LABORATORY DATA:  I have reviewed the labs as listed.  CBC    Component Value Date/Time   WBC 11.5 (H) 06/03/2024 1114   RBC 4.56 06/03/2024 1114   HGB 13.8 06/03/2024 1114   HCT 43.3 06/03/2024 1114   PLT 284 06/03/2024 1114   MCV 95.0 06/03/2024 1114   MCH 30.3 06/03/2024 1114   MCHC 31.9 06/03/2024 1114   RDW 13.7 06/03/2024 1114   LYMPHSABS 2.9 06/03/2024 1114   MONOABS 0.7 06/03/2024 1114   EOSABS 1.5 (H) 06/03/2024 1114   BASOSABS 0.1 06/03/2024 1114      Latest Ref Rng & Units 06/03/2024   11:14 AM 02/26/2024   10:28 AM 12/22/2023    4:12 PM  CMP  Glucose 70 - 99 mg/dL 857  836  97   BUN 8 - 23 mg/dL 14  16  16    Creatinine 0.61 - 1.24 mg/dL 9.44  9.41  9.40   Sodium 135 - 145 mmol/L 143  140  141   Potassium 3.5 - 5.1 mmol/L 3.5  3.3  3.5   Chloride 98 - 111 mmol/L 103  100  105   CO2 22 - 32 mmol/L 28  28  26    Calcium  8.9 - 10.3 mg/dL 9.5  9.4  9.3   Total Protein 6.5 - 8.1 g/dL  7.9    Total Bilirubin 0.0 - 1.2 mg/dL  0.9    Alkaline Phos 38 - 126 U/L  59    AST 15 - 41 U/L  24    ALT 0 - 44 U/L  24      DIAGNOSTIC IMAGING:  I have independently reviewed the relevant imaging and discussed with the patient.   WRAP UP:  All questions were answered. The patient knows to call the clinic with any problems, questions or concerns.  Medical decision  making: Moderate***  Time spent on visit: I spent 20 minutes counseling the patient face to face. The total time spent in the appointment was 30 minutes and more than 50% was on counseling.  Pleasant CHRISTELLA Barefoot, PA-C  *** "

## 2024-06-09 ENCOUNTER — Inpatient Hospital Stay: Admitting: Physician Assistant

## 2024-06-09 VITALS — BP 138/73 | HR 77 | Temp 97.9°F | Resp 16 | Wt 209.0 lb

## 2024-06-09 DIAGNOSIS — E538 Deficiency of other specified B group vitamins: Secondary | ICD-10-CM | POA: Diagnosis not present

## 2024-06-09 DIAGNOSIS — D500 Iron deficiency anemia secondary to blood loss (chronic): Secondary | ICD-10-CM | POA: Diagnosis not present

## 2024-06-09 NOTE — Patient Instructions (Signed)
 [facility] at [facility] **VISIT SUMMARY & IMPORTANT INSTRUCTIONS **   You were seen today by Pleasant Barefoot PA-C for your follow-up visit.    IRON  DEFICIENCY ANEMIA Your blood and iron  levels look much better! You do not need any IV iron  at this time. Continue taking iron  tablet every other day.  Take this with food to decrease stomach upset. You remain at risk for ongoing blood loss and recurrent iron  deficiency anemia, so we will recheck labs and see you for follow-up visit in 6 months. Please contact us  sooner if you have any of the following... SIGNS OF BLEEDING: This could be bright red blood in your bowel movements, or black and tarry bowel movements. SYMPTOMS OF ANEMIA: Increased fatigue and weakness, shortness of breath with exertion, lightheadedness, or cravings for ice.  VITAMIN B12 DEFICIENCY Your vitamin B12 levels have improved. Continue taking vitamin B12 1000 mcg tablet daily.  FOLLOW-UP APPOINTMENT: 6 months  ** Thank you for trusting me with your healthcare!  I strive to provide all of my patients with quality care at each visit.  If you receive a survey for this visit, I would be so grateful to you for taking the time to provide feedback.  Thank you in advance!  ~ Tran Randle                                        Dr. Mickiel Davonna Pleasant Barefoot, PA-C          Delon Hope, NP   - - - - - - - - - - - - - - - - - -    Thank you for choosing [facility] at [facility] to provide your oncology and hematology care.  To afford each patient quality time with our provider, please arrive at least 15 minutes before your scheduled appointment time.   If you have a lab appointment with the Cancer Center please come in thru the Main Entrance and check in at the main information desk.  You need to re-schedule your appointment should you arrive 10 or more minutes late.  We strive to give you quality time with our  providers, and arriving late affects you and other patients whose appointments are after yours.  Also, if you no show three or more times for appointments you may be dismissed from the clinic at the providers discretion.     Again, thank you for choosing [facility].  Our hope is that these requests will decrease the amount of time that you wait before being seen by our physicians.       _____________________________________________________________  Should you have questions after your visit to [facility], please contact our office at [phone] and follow the prompts.  Our office hours are 8:00 a.m. and 4:30 p.m. Monday - Friday.  Please note that voicemails left after 4:00 p.m. may not be returned until the following business day.  We are closed weekends and major holidays.  You do have access to a nurse 24-7, just call the main number to the clinic [phone] and do not press any options, hold on the line and a nurse will answer the phone.    For prescription refill requests, have your pharmacy contact our office and allow 72 hours.

## 2024-06-11 ENCOUNTER — Inpatient Hospital Stay: Admitting: Physician Assistant

## 2024-08-18 ENCOUNTER — Other Ambulatory Visit (HOSPITAL_COMMUNITY)

## 2024-09-05 ENCOUNTER — Ambulatory Visit: Admitting: Cardiovascular Disease

## 2024-10-14 ENCOUNTER — Other Ambulatory Visit (HOSPITAL_COMMUNITY)

## 2024-12-08 ENCOUNTER — Inpatient Hospital Stay

## 2024-12-15 ENCOUNTER — Inpatient Hospital Stay: Admitting: Physician Assistant
